# Patient Record
Sex: Female | Born: 1945 | Race: White | Hispanic: No | Smoking: Current every day smoker
Health system: Southern US, Community
[De-identification: ages and names within clinical notes are randomized; demographics above are authoritative.]

## PROBLEM LIST (undated history)

## (undated) DIAGNOSIS — Z972 Presence of dental prosthetic device (complete) (partial): Secondary | ICD-10-CM

## (undated) DIAGNOSIS — F32A Depression, unspecified: Secondary | ICD-10-CM

## (undated) DIAGNOSIS — F329 Major depressive disorder, single episode, unspecified: Secondary | ICD-10-CM

## (undated) DIAGNOSIS — K219 Gastro-esophageal reflux disease without esophagitis: Secondary | ICD-10-CM

## (undated) DIAGNOSIS — I639 Cerebral infarction, unspecified: Secondary | ICD-10-CM

## (undated) DIAGNOSIS — F419 Anxiety disorder, unspecified: Secondary | ICD-10-CM

## (undated) DIAGNOSIS — I10 Essential (primary) hypertension: Secondary | ICD-10-CM

## (undated) DIAGNOSIS — J449 Chronic obstructive pulmonary disease, unspecified: Secondary | ICD-10-CM

## (undated) DIAGNOSIS — G8929 Other chronic pain: Secondary | ICD-10-CM

## (undated) DIAGNOSIS — I2510 Atherosclerotic heart disease of native coronary artery without angina pectoris: Secondary | ICD-10-CM

## (undated) DIAGNOSIS — E7800 Pure hypercholesterolemia, unspecified: Secondary | ICD-10-CM

## (undated) DIAGNOSIS — M4319 Spondylolisthesis, multiple sites in spine: Secondary | ICD-10-CM

## (undated) HISTORY — DX: Depression, unspecified: F32.A

## (undated) HISTORY — DX: Anxiety disorder, unspecified: F41.9

## (undated) HISTORY — PX: BACK SURGERY: SHX140

## (undated) HISTORY — DX: Major depressive disorder, single episode, unspecified: F32.9

## (undated) HISTORY — DX: Cerebral infarction, unspecified: I63.9

## (undated) HISTORY — DX: Gastro-esophageal reflux disease without esophagitis: K21.9

---

## 1980-07-29 HISTORY — PX: ABDOMINAL HYSTERECTOMY: SHX81

## 2005-10-06 DIAGNOSIS — F410 Panic disorder [episodic paroxysmal anxiety] without agoraphobia: Secondary | ICD-10-CM | POA: Insufficient documentation

## 2006-06-26 DIAGNOSIS — Z720 Tobacco use: Secondary | ICD-10-CM | POA: Insufficient documentation

## 2009-02-15 ENCOUNTER — Ambulatory Visit: Payer: Self-pay | Admitting: Family Medicine

## 2009-02-15 DIAGNOSIS — F411 Generalized anxiety disorder: Secondary | ICD-10-CM | POA: Insufficient documentation

## 2009-03-01 DIAGNOSIS — R7309 Other abnormal glucose: Secondary | ICD-10-CM | POA: Insufficient documentation

## 2009-03-01 LAB — HM MAMMOGRAPHY

## 2009-04-26 ENCOUNTER — Ambulatory Visit: Payer: Self-pay | Admitting: Gastroenterology

## 2009-06-14 ENCOUNTER — Ambulatory Visit: Payer: Self-pay | Admitting: Gastroenterology

## 2011-04-03 ENCOUNTER — Ambulatory Visit: Payer: Self-pay | Admitting: Nephrology

## 2013-11-03 ENCOUNTER — Observation Stay: Payer: Self-pay | Admitting: Internal Medicine

## 2013-11-03 LAB — CBC
HCT: 39.7 % (ref 35.0–47.0)
HGB: 13.4 g/dL (ref 12.0–16.0)
MCH: 33.5 pg (ref 26.0–34.0)
MCHC: 33.8 g/dL (ref 32.0–36.0)
MCV: 99 fL (ref 80–100)
Platelet: 256 10*3/uL (ref 150–440)
RBC: 4.01 10*6/uL (ref 3.80–5.20)
RDW: 13.3 % (ref 11.5–14.5)
WBC: 6.7 10*3/uL (ref 3.6–11.0)

## 2013-11-03 LAB — BASIC METABOLIC PANEL
ANION GAP: 5 — AB (ref 7–16)
BUN: 10 mg/dL (ref 7–18)
CHLORIDE: 93 mmol/L — AB (ref 98–107)
CREATININE: 0.86 mg/dL (ref 0.60–1.30)
Calcium, Total: 8.8 mg/dL (ref 8.5–10.1)
Co2: 28 mmol/L (ref 21–32)
EGFR (Non-African Amer.): >60
Glucose: 110 mg/dL — ABNORMAL HIGH (ref 65–99)
Osmolality: 253 (ref 275–301)
Potassium: 4.4 mmol/L (ref 3.5–5.1)
Sodium: 126 mmol/L — ABNORMAL LOW (ref 136–145)

## 2013-11-03 LAB — TROPONIN I: Troponin-I: <0.02 ng/mL

## 2013-11-03 LAB — OSMOLALITY: Osmolality: 266 mOsm/kg — ABNORMAL LOW (ref 280–301)

## 2013-11-03 LAB — TSH: Thyroid Stimulating Horm: 1.97 u[IU]/mL

## 2013-11-03 LAB — PRO B NATRIURETIC PEPTIDE: B-TYPE NATIURETIC PEPTID: 2020 pg/mL — AB (ref 0–125)

## 2013-11-04 LAB — BASIC METABOLIC PANEL
ANION GAP: 3 — AB (ref 7–16)
BUN: 12 mg/dL (ref 7–18)
CHLORIDE: 97 mmol/L — AB (ref 98–107)
CO2: 31 mmol/L (ref 21–32)
Calcium, Total: 8.8 mg/dL (ref 8.5–10.1)
Creatinine: 0.82 mg/dL (ref 0.60–1.30)
EGFR (African American): >60
EGFR (Non-African Amer.): >60
Glucose: 137 mg/dL — ABNORMAL HIGH (ref 65–99)
Osmolality: 265 (ref 275–301)
Potassium: 4 mmol/L (ref 3.5–5.1)
SODIUM: 131 mmol/L — AB (ref 136–145)

## 2013-12-08 ENCOUNTER — Ambulatory Visit: Payer: Self-pay | Admitting: Urgent Care

## 2013-12-31 DIAGNOSIS — I071 Rheumatic tricuspid insufficiency: Secondary | ICD-10-CM | POA: Insufficient documentation

## 2013-12-31 DIAGNOSIS — R079 Chest pain, unspecified: Secondary | ICD-10-CM | POA: Insufficient documentation

## 2013-12-31 DIAGNOSIS — J449 Chronic obstructive pulmonary disease, unspecified: Secondary | ICD-10-CM | POA: Insufficient documentation

## 2013-12-31 DIAGNOSIS — I340 Nonrheumatic mitral (valve) insufficiency: Secondary | ICD-10-CM | POA: Insufficient documentation

## 2014-02-01 ENCOUNTER — Ambulatory Visit: Payer: Self-pay | Admitting: Gastroenterology

## 2014-02-01 LAB — HM COLONOSCOPY

## 2014-02-03 LAB — PATHOLOGY REPORT

## 2014-08-05 LAB — CBC AND DIFFERENTIAL
HCT: 40 % (ref 36–46)
Hemoglobin: 13.7 g/dL (ref 12.0–16.0)
NEUTROS ABS: 3 /uL
Platelets: 312 10*3/uL (ref 150–399)
WBC: 6.2 10^3/mL

## 2014-08-05 LAB — LIPID PANEL
Cholesterol: 226 mg/dL — AB (ref 0–200)
HDL: 69 mg/dL (ref 35–70)
LDL Cholesterol: 138 mg/dL
Triglycerides: 95 mg/dL (ref 40–160)

## 2014-08-05 LAB — BASIC METABOLIC PANEL
BUN: 12 mg/dL (ref 4–21)
CREATININE: 0.8 mg/dL (ref 0.5–1.1)
Glucose: 98 mg/dL
Potassium: 4.8 mmol/L (ref 3.4–5.3)
SODIUM: 134 mmol/L — AB (ref 137–147)

## 2014-08-05 LAB — HEPATIC FUNCTION PANEL
ALK PHOS: 102 U/L (ref 25–125)
ALT: 7 U/L (ref 7–35)
AST: 18 U/L (ref 13–35)
Bilirubin, Total: 0.4 mg/dL

## 2014-08-05 LAB — TSH: TSH: 1.16 u[IU]/mL (ref 0.41–5.90)

## 2014-08-05 LAB — HEMOGLOBIN A1C: Hgb A1c MFr Bld: 5.6 % (ref 4.0–6.0)

## 2014-11-19 NOTE — Discharge Summary (Signed)
PATIENT NAME:  [name] [name], [name] [name] MR#:  [id number] DATE OF BIRTH:  [birth year]  DATE OF ADMISSION:  11/03/2013  DATE OF DISCHARGE:  11/04/2013  ADMISSION DIAGNOSIS:  Hypertension.   DISCHARGE DIAGNOSES:  1.  Accelerated hypertension.  2.  History of alcohol abuse.  3.  History of tobacco abuse.  4.  History of COPD.   5.  Weight loss with flushing.  6.  Hyponatremia.   CONSULTATIONS: None.   LABORATORIES AT DISCHARGE:   Sodium 131, potassium 4.0, chloride 97, bicarbonate 31, BUN 12, creatinine 0.18, glucose is 137.   CT of the head showed no acute intracranial hemorrhage.   HOSPITAL COURSE:  69 year old female who was brought in by her primary care physician with elevated blood pressure. For further details, please see H and P.   1.  Accelerated hypertension. The patient was admitted to telemetry for accelerated hypertension. She was placed on lisinopril and metoprolol. I discussed with her that it is very important that she follow up with her primary care physician regarding her hypertension. I suspect that she has had long-standing hypertension and she has not seen a PCP for this. Since she is on the lisinopril, she will need labs for creatinine and  potassium, which she was made aware of.  2.  Alcohol and tobacco abuse. The patient recounseled and patient says she is going to stop smoking and drinking alcohol. We are discharging her with a nicotine patch and we encourage her.  3.  COPD, which is stable. The patient will be discharged with Advair, which was written for label to be discharged with.   4.  Old CVA. This is seen on a head CT. She is started on aspirin and statin, however due to her alcohol history I have not discharged the patient on a statin but she will continue aspirin.   DISCHARGE MEDICATIONS:  1.  Lisinopril 40 mg daily.  2.  Metoprolol 50 mg daily.  3.  Xanax 0.5 mg b.i.d. p.r.n.  4.  Aspirin 81 mg daily.  5. Nicotine patch 21 mg per 24 hours.   DISCHARGE DIET: Low  sodium.   DISCHARGE ACTIVITY: As tolerated.   DISCHARGE FOLLOW UP:  Patient will follow up next week with Dr. Venia Minks.   TIME SPENT: Approximately 35 minutes.   DISPOSITION:  The patient is medically stable for discharge.     ___________________________ Analeese Andreatta P. Benjie Karvonen, MD spm:tc D: 11/04/2013 14:11:32 ET T: 11/04/2013 15:16:27 ET JOB#: [id number]  cc: Darely Becknell P. Benjie Karvonen, MD, <Dictator> Donell Beers Tamanna Whitson MD ELECTRONICALLY SIGNED 11/05/2013 12:50

## 2014-11-19 NOTE — H&P (Signed)
PATIENT NAME:  [name] [name] MR#:  [id number] DATE OF BIRTH:  [birth year]  DATE OF ADMISSION:  11/03/2013  PRIMARY CARE PROVIDER: Jerrell Belfast, MD  CARDIOLOGIST: Isaias Cowman, MD  NEPHROLOGY: Tama High, MD  CHIEF COMPLAINT: High blood pressure and eye spots.   HISTORY OF PRESENT ILLNESS: A 69 year old Caucasian female patient with history of hypertension, COPD, anxiety and alcohol abuse, tobacco abuse, presents to the Emergency Room sent in by her primary care physician's office after she called her saying her blood pressure is high and has spots in her eyes. The patient had blood pressure elevated at 235/102 in the Emergency Room. In spite of 2 IV doses of labetalol 10 mg, her blood pressure is 203/91 and with headache, some spots in her eyes, is being admitted to the hospitalist service. When I see the patient, the patient mentions that her vision is better although she feels like her glasses need to be changed. Headache is improved. The patient has had on-and-off chest pain which improves with belching for over 2 years for which she has seen Dr. Saralyn Pilar, had workup done. Presently, no chest pain. She has chronic shortness of breath which is the same. She continues to smoke a pack a day. She mentions that her husband passed away in 08/10/2013 and since then, she has been drinking about 4 to 6 beers a day to cope with grief. No recent change in blood pressure pills.    PAST MEDICAL HISTORY: 1.  COPD.   2.  Hypertension.  3.  Anxiety.  4.  Tobacco abuse.  5.  Alcohol abuse.  6.  Partial hysterectomy.   FAMILY HISTORY: CABG in her dad.   SOCIAL HISTORY: The patient smokes a pack a day, 4 to 6 beers a day. No illicit drugs. Lives alone.   ALLERGIES: No known drug allergies.   REVIEW OF SYSTEMS:    CONSTITUTIONAL: Complains of fatigue.  EYES:  No blurred vision, pain, redness.  ENT: No tinnitus, ear pain, hearing loss.  RESPIRATORY:  Has chronic cough, some  wheezing, COPD.  CARDIOVASCULAR: No chest pain, orthopnea, edema.  GASTROINTESTINAL: No nausea, vomiting, diarrhea, abdominal pain.  GENITOURINARY: No dysuria, hematuria, frequency.  ENDOCRINE: No polyuria, nocturia, thyroid problems.  HEMATOLOGIC AND LYMPHATIC: No anemia, easy bruising or bleeding.  INTEGUMENTARY:  No acne, rash or lesion. MUSCULOSKELETAL: No back pain, arthritis.  NEUROLOGIC: No focal numbness, weakness. Does have headache.  PSYCHIATRIC: Has anxiety.   HOME MEDICATIONS: 1.  Alprazolam 0.5 mg 1 tablet 2 times a day as needed.  2.  Ibuprofen 200 mg 2 tablets 4 times a day as needed for pain.  3.  Metoprolol succinate 50 mg extended-release daily.   PHYSICAL EXAMINATION: VITAL SIGNS:  Blood pressure 235/102, presently improved to 203/91, saturating 95% on room air. Heart rate 74, temperature 97.6.  GENERAL: Moderately-built Caucasian female patient lying in bed, seems comfortable, conversational, cooperative with exam.  PSYCHIATRIC: Is alert and oriented x 3 but anxious.  HEENT:  Atraumatic, normocephalic. Oral mucosa moist and pink. External ears normal. No pallor. No icterus. No oral ulcers or thrush. Pupils bilaterally equal and reactive to light.  NECK:  Supple, no thyromegaly or palpable lymph nodes. Trachea midline. No carotid bruit, JVD.  CARDIOVASCULAR: S1, S2, without any murmurs, peripheral pulses 2+. No edema.  RESPIRATORY: Normal work of breathing. Clear to auscultation on both sides.  GASTROINTESTINAL: Soft abdomen, nontender. Bowel sounds present. No hepatosplenomegaly palpable.  GENITOURINARY: No CVA tenderness, bladder distention.  SKIN:  Warm and dry. No petechiae, rash, ulcers.  MUSCULOSKELETAL: No joint swelling, redness, effusion of the large joints. Normal muscle tone.  NEUROLOGICAL: Motor strength 5/5 in upper and lower extremities. Sensation is intact all over. No nystagmus.  LYMPHATIC:  No cervical lymphadenopathy.   LABORATORY, DIAGNOSTIC AND  RADIOLOGICAL DATA:   1.  Show glucose of 110. BNP of 2020. BUN 10, creatinine 0.86, sodium 136, potassium 4.4, chloride 93. Her troponin is less than 0.02. TSH of 1.97.  2.  WBC 6.7,  platelets of 256.  3.  EKG shows normal sinus rhythm with poor R wave progression. No ST-T wave changes. Does have LVH.   4.  CT scan of the head without contrast shows old lacunar infarctions in the basal ganglia. Nothing acute. Mild generalized atrophy.  5.  Chest x-ray, PA and lateral, showed COPD without any evidence of acute abnormalities.   ASSESSMENT AND PLAN: 1.  Accelerated hypertension with headache and vision changes in a patient with alcohol abuse and prior history of hypertension. Presently, I suspect her blood pressure has worsened over time with no change in medications but also her alcohol abuse is contributing to this. We will give her a dose of stat lisinopril, put her on a telemetry bed and monitor closely. Use IV p.r.n. medications as needed. Will trend down the blood pressure slowly.  2.  History of cerebrovascular accident. Nothing acute on the CT scan of the head but has history of lacunar infarcts likely secondary from uncontrolled hypertension. We will start her on a statin and baby aspirin.  3.  Chronic hyponatremia. The patient has been seen by nephrologist and she mentioned that she does run low. This could have been worsened a little with her daily beer intake. Needs to be monitored.  4.  Tobacco abuse. Counseled patient to quit smoking for greater than 3 minutes.  5.  Chronic obstructive pulmonary disease, stable.  6.  Alcohol abuse, contributing to her hypertension. I have advised her to stop drinking alcohol like beer. Will watch her for any withdrawal symptoms.  7.  Deep vein thrombosis prophylaxis with sequential compression devices.  8.  CODE STATUS: Full code.   TIMES SPENT ON THIS CASE: Was 50 minutes.   ____________________________ Leia Alf Leemon Ayala, MD srs:cs D: 11/03/2013  14:42:25 ET T: 11/03/2013 15:05:52 ET JOB#: [id number]  cc: Alveta Heimlich R. Darvin Neighbours, MD, <Dictator> Jerrell Belfast, MD Isaias Cowman, MD [place] Lilian Kapur, MD Neita Carp MD ELECTRONICALLY SIGNED 11/04/2013 10:41

## 2014-12-28 ENCOUNTER — Emergency Department: Payer: Medicare Other

## 2014-12-28 ENCOUNTER — Encounter: Payer: Self-pay | Admitting: Emergency Medicine

## 2014-12-28 ENCOUNTER — Emergency Department
Admission: EM | Admit: 2014-12-28 | Discharge: 2014-12-28 | Disposition: A | Discharge status: Home / Self Care | Payer: Medicare Other | Attending: Student | Admitting: Student

## 2014-12-28 DIAGNOSIS — I10 Essential (primary) hypertension: Secondary | ICD-10-CM | POA: Diagnosis not present

## 2014-12-28 DIAGNOSIS — S2232XA Fracture of one rib, left side, initial encounter for closed fracture: Secondary | ICD-10-CM | POA: Insufficient documentation

## 2014-12-28 DIAGNOSIS — S299XXA Unspecified injury of thorax, initial encounter: Secondary | ICD-10-CM | POA: Diagnosis present

## 2014-12-28 DIAGNOSIS — S2231XA Fracture of one rib, right side, initial encounter for closed fracture: Secondary | ICD-10-CM

## 2014-12-28 DIAGNOSIS — Y9289 Other specified places as the place of occurrence of the external cause: Secondary | ICD-10-CM | POA: Diagnosis not present

## 2014-12-28 DIAGNOSIS — Z720 Tobacco use: Secondary | ICD-10-CM | POA: Diagnosis not present

## 2014-12-28 DIAGNOSIS — W1839XA Other fall on same level, initial encounter: Secondary | ICD-10-CM | POA: Insufficient documentation

## 2014-12-28 DIAGNOSIS — J942 Hemothorax: Secondary | ICD-10-CM | POA: Diagnosis not present

## 2014-12-28 DIAGNOSIS — Y9389 Activity, other specified: Secondary | ICD-10-CM | POA: Insufficient documentation

## 2014-12-28 DIAGNOSIS — S20212A Contusion of left front wall of thorax, initial encounter: Secondary | ICD-10-CM | POA: Insufficient documentation

## 2014-12-28 DIAGNOSIS — Y998 Other external cause status: Secondary | ICD-10-CM | POA: Insufficient documentation

## 2014-12-28 HISTORY — DX: Atherosclerotic heart disease of native coronary artery without angina pectoris: I25.10

## 2014-12-28 HISTORY — DX: Pure hypercholesterolemia, unspecified: E78.00

## 2014-12-28 HISTORY — DX: Chronic obstructive pulmonary disease, unspecified: J44.9

## 2014-12-28 HISTORY — DX: Essential (primary) hypertension: I10

## 2014-12-28 MED ORDER — IBUPROFEN 800 MG PO TABS
ORAL_TABLET | ORAL | Status: AC
  Filled 2014-12-28: qty 1

## 2014-12-28 MED ORDER — IBUPROFEN 800 MG PO TABS
800.0000 mg | ORAL_TABLET | Freq: Once | ORAL | Status: AC
  Administered 2014-12-28: 800 mg via ORAL

## 2014-12-28 MED ORDER — OXYCODONE-ACETAMINOPHEN 5-325 MG PO TABS
1.0000 | ORAL_TABLET | Freq: Once | ORAL | Status: AC
  Administered 2014-12-28: 1 via ORAL

## 2014-12-28 MED ORDER — OXYCODONE-ACETAMINOPHEN 5-325 MG PO TABS
ORAL_TABLET | ORAL | Status: AC
  Filled 2014-12-28: qty 1

## 2014-12-28 MED ORDER — OXYCODONE-ACETAMINOPHEN 5-325 MG PO TABS: 1.0000 | ORAL_TABLET | ORAL | Status: DC | PRN

## 2014-12-28 NOTE — ED Notes (Signed)
States she fell on sat. And having pain to left lateral rib area

## 2014-12-28 NOTE — ED Provider Notes (Signed)
[facility] Emergency Department Provider Note ____________________________________________  Time seen: Approximately 12:08 PM  I have reviewed the triage vital signs and the nursing notes.   HISTORY  Chief Complaint Rib Injury    HPI [name] [name] is a 69 y.o. female complaining of left rib pain secondary to fall 3 days ago. Patient has noticed some bruising to the left lateral rib cage and pain with deep respirations. Patient denies any shortness of breath. Patient rates the pain as a 9/10 which increases with movement and deep breathing. States unresolved pain with over-the-counter medications.   Past Medical History  Diagnosis Date  . COPD (chronic obstructive pulmonary disease)   . Hypertension   . CAD (coronary artery disease) unk  . Hypercholesteremia unk    There are no active problems to display for this patient.   No past surgical history on file.  Current Outpatient Rx  Name  Route  Sig  Dispense  Refill  . oxyCODONE-acetaminophen (ROXICET) 5-325 MG per tablet   Oral   Take 1 tablet by mouth every 4 (four) hours as needed for severe pain.   20 tablet   0     Allergies Review of patient's allergies indicates not on file.  No family history on file.  Social History History  Substance Use Topics  . Smoking status: Current Every Day Smoker  . Smokeless tobacco: Not on file  . Alcohol Use: No    Review of Systems Constitutional: No fever/chills Eyes: No visual changes. ENT: No sore throat. Cardiovascular: Denies chest pain. Respiratory: Denies shortness of breath. Gastrointestinal: No abdominal pain.  No nausea, no vomiting.  No diarrhea.  No constipation. Genitourinary: Negative for dysuria. Musculoskeletal: Left lateral chest wall pain. Skin: Bruises left lateral chest wall Neurological: Negative for headaches, focal weakness or numbness. 10-point ROS otherwise  negative.  ____________________________________________   PHYSICAL EXAM:  VITAL SIGNS: ED Triage Vitals  Enc Vitals Group     BP 12/28/14 1134 117/62 mmHg     Pulse Rate 12/28/14 1134 75     Resp 12/28/14 1134 18     Temp 12/28/14 1134 97.7 F (36.5 C)     Temp Source 12/28/14 1134 Oral     SpO2 12/28/14 1134 96 %     Weight 12/28/14 1134 115 lb (52.164 kg)     Height 12/28/14 1134 5\' 5"  (1.651 m)     Head Cir --      Peak Flow --      Pain Score 12/28/14 1135 9     Pain Loc --      Pain Edu? --      Excl. in [place]? --    Constitutional: Alert and oriented. Well appearing and in no acute distress. Eyes: Conjunctivae are normal. PERRL. EOMI. Head: Atraumatic. Nose: No congestion/rhinnorhea. Mouth/Throat: Mucous membranes are moist.  Oropharynx non-erythematous. Neck: No stridor.  No spinal deformity free nuchal range of motion nontender palpation. Hematological/Lymphatic/Immunilogical: No cervical lymphadenopathy. Cardiovascular: Normal rate, regular rhythm. Grossly normal heart sounds.  Good peripheral circulation. Respiratory: Decreased respiratory effort.  No retractions. Lungs CTAB. Gastrointestinal: Soft and nontender. No distention. No abdominal bruits. No CVA tenderness. Musculoskeletal: No obvious deformity left lateral chest wall there is ecchymosis and guarding with palpation. Decreased lung expansion secondary to complain of pain. Neurologic:  Normal speech and language. No gross focal neurologic deficits are appreciated. Speech is normal. No gait instability. Skin:  Skin is warm, dry and intact. No rash noted. Psychiatric: Mood and affect  are normal. Speech and behavior are normal.  ____________________________________________   LABS (all labs ordered are listed, but only abnormal results are displayed)  Labs Reviewed - No data to display ____________________________________________  EKG   ____________________________________________  RADIOLOGY  Minimal  displaced rib fractures of the left side 7 through 10. Small hemothorax. ____________________________________________   PROCEDURES  Procedure(s) performed: None  Critical Care performed: No  ____________________________________________   INITIAL IMPRESSION / ASSESSMENT AND PLAN / ED COURSE  Pertinent labs & imaging results that were available during my care of the patient were reviewed by me and considered in my medical decision making (see chart for details).  Left rib contusion. Discussed x-ray findings with Dr. Burt Knack who advised patient does not meet criteria for chest tube. Discussed home treatment plan with patient and daughter. ____________________________________________   FINAL CLINICAL IMPRESSION(S) / ED DIAGNOSES  Final diagnoses:  Right rib fracture, closed, initial encounter  Hemothorax, left      Sable Feil, PA-C 12/28/14 Tunica [name], MD 12/29/14 [phone]

## 2014-12-29 ENCOUNTER — Other Ambulatory Visit: Payer: Self-pay

## 2014-12-29 DIAGNOSIS — I10 Essential (primary) hypertension: Secondary | ICD-10-CM | POA: Insufficient documentation

## 2014-12-29 DIAGNOSIS — E785 Hyperlipidemia, unspecified: Secondary | ICD-10-CM | POA: Insufficient documentation

## 2014-12-29 DIAGNOSIS — K219 Gastro-esophageal reflux disease without esophagitis: Secondary | ICD-10-CM | POA: Insufficient documentation

## 2014-12-29 DIAGNOSIS — R87629 Unspecified abnormal cytological findings in specimens from vagina: Secondary | ICD-10-CM | POA: Insufficient documentation

## 2014-12-29 DIAGNOSIS — R6889 Other general symptoms and signs: Secondary | ICD-10-CM | POA: Insufficient documentation

## 2014-12-29 DIAGNOSIS — E871 Hypo-osmolality and hyponatremia: Secondary | ICD-10-CM | POA: Insufficient documentation

## 2014-12-29 DIAGNOSIS — Z6823 Body mass index (BMI) 23.0-23.9, adult: Secondary | ICD-10-CM | POA: Insufficient documentation

## 2014-12-29 DIAGNOSIS — K259 Gastric ulcer, unspecified as acute or chronic, without hemorrhage or perforation: Secondary | ICD-10-CM | POA: Insufficient documentation

## 2014-12-29 DIAGNOSIS — F32A Depression, unspecified: Secondary | ICD-10-CM

## 2014-12-29 DIAGNOSIS — J449 Chronic obstructive pulmonary disease, unspecified: Secondary | ICD-10-CM | POA: Insufficient documentation

## 2014-12-29 DIAGNOSIS — R0689 Other abnormalities of breathing: Secondary | ICD-10-CM | POA: Insufficient documentation

## 2014-12-29 DIAGNOSIS — F329 Major depressive disorder, single episode, unspecified: Secondary | ICD-10-CM

## 2014-12-29 DIAGNOSIS — R002 Palpitations: Secondary | ICD-10-CM | POA: Insufficient documentation

## 2014-12-29 DIAGNOSIS — Z8673 Personal history of transient ischemic attack (TIA), and cerebral infarction without residual deficits: Secondary | ICD-10-CM | POA: Insufficient documentation

## 2014-12-29 DIAGNOSIS — R799 Abnormal finding of blood chemistry, unspecified: Secondary | ICD-10-CM | POA: Insufficient documentation

## 2014-12-29 DIAGNOSIS — M722 Plantar fascial fibromatosis: Secondary | ICD-10-CM | POA: Insufficient documentation

## 2014-12-29 MED ORDER — CITALOPRAM HYDROBROMIDE 20 MG PO TABS: 20.0000 mg | ORAL_TABLET | Freq: Every day | ORAL | Status: DC

## 2014-12-29 MED ORDER — METOPROLOL SUCCINATE ER 50 MG PO TB24: 50.0000 mg | ORAL_TABLET | Freq: Every day | ORAL | Status: DC

## 2014-12-29 MED ORDER — SIMVASTATIN 10 MG PO TABS: 10.0000 mg | ORAL_TABLET | Freq: Every day | ORAL | Status: DC

## 2014-12-30 ENCOUNTER — Ambulatory Visit (INDEPENDENT_AMBULATORY_CARE_PROVIDER_SITE_OTHER): Payer: Medicare Other | Admitting: Family Medicine

## 2014-12-30 ENCOUNTER — Encounter: Payer: Self-pay | Admitting: Family Medicine

## 2014-12-30 VITALS — BP 122/68 | HR 68 | Temp 97.7°F | Resp 16 | Ht 68.0 in | Wt 119.0 lb

## 2014-12-30 DIAGNOSIS — Z720 Tobacco use: Secondary | ICD-10-CM | POA: Diagnosis not present

## 2014-12-30 DIAGNOSIS — K5900 Constipation, unspecified: Secondary | ICD-10-CM | POA: Diagnosis not present

## 2014-12-30 DIAGNOSIS — S2232XD Fracture of one rib, left side, subsequent encounter for fracture with routine healing: Secondary | ICD-10-CM | POA: Diagnosis not present

## 2014-12-30 DIAGNOSIS — J441 Chronic obstructive pulmonary disease with (acute) exacerbation: Secondary | ICD-10-CM | POA: Diagnosis not present

## 2014-12-30 MED ORDER — DOXYCYCLINE HYCLATE 100 MG PO TABS: 100.0000 mg | ORAL_TABLET | Freq: Two times a day (BID) | ORAL | Status: DC

## 2014-12-30 MED ORDER — PREDNISONE 10 MG PO TABS: ORAL_TABLET | ORAL | Status: DC

## 2014-12-30 MED ORDER — ALBUTEROL SULFATE HFA 108 (90 BASE) MCG/ACT IN AERS: 2.0000 | INHALATION_SPRAY | RESPIRATORY_TRACT | Status: DC | PRN

## 2014-12-30 NOTE — Progress Notes (Signed)
Subjective:    Patient ID: [name] [name], female    DOB: Jun 05, 1946, 69 y.o.   MRN: [id number]  Constipation This is a new problem. The current episode started more than 1 month ago. The problem has been gradually worsening (Especially since starting pain medication for rib fracture.) since onset. Her stool frequency is 2 to 3 times per week. She does not exercise regularly. Pertinent negatives include no abdominal pain, back pain, diarrhea, fever, nausea or vomiting.  Fall The accident occurred 5 to 7 days ago (Pt reports tripping in her yard and falling on her left side. ). The fall occurred while walking. There was no blood loss. Point of impact: Left Ribs. The pain is severe. Pertinent negatives include no abdominal pain, bowel incontinence, fever, nausea or vomiting.  Cough This is a chronic problem. The problem has been gradually worsening. The problem occurs constantly. The cough is productive of sputum. Associated symptoms include chest pain, myalgias, nasal congestion, postnasal drip, rhinorrhea, shortness of breath and wheezing. Pertinent negatives include no chills or fever. Risk factors for lung disease include smoking/tobacco exposure. She has tried nothing for the symptoms. Her past medical history is significant for COPD.      Review of Systems  Constitutional: Positive for activity change, appetite change and fatigue. Negative for fever, chills, diaphoresis and unexpected weight change.  HENT: Positive for postnasal drip and rhinorrhea.   Respiratory: Positive for cough, choking, shortness of breath and wheezing.   Cardiovascular: Positive for chest pain and leg swelling. Negative for palpitations.  Gastrointestinal: Positive for constipation. Negative for nausea, vomiting, abdominal pain, diarrhea, blood in stool and bowel incontinence.  Musculoskeletal: Positive for myalgias and arthralgias. Negative for back pain, joint swelling, neck pain and neck stiffness.   Patient  Active Problem List   Diagnosis Date Noted  . Alveolar aeration decreased 12/29/2014  . Body mass index (BMI) of 23.0-23.9 in adult 12/29/2014  . CAFL (chronic airflow limitation) 12/29/2014  . Abnormal finding of blood chemistry 12/29/2014  . Gastric ulcer 12/29/2014  . H/O transient cerebral ischemia 12/29/2014  . HLD (hyperlipidemia) 12/29/2014  . BP (high blood pressure) 12/29/2014  . Below normal amount of sodium in the blood 12/29/2014  . Awareness of heartbeats 12/29/2014  . Abnormal Pap smear of vagina 12/29/2014  . Plantar fasciitis 12/29/2014  . Esophageal reflux 12/29/2014  . 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 TI (tricuspid incompetence) 12/31/2013  . Abnormal blood sugar 03/01/2009  . Clinical depression 02/15/2009  . Current tobacco use 06/26/2006  . Episodic paroxysmal anxiety disorder 10/06/2005    History   Social History  . Marital Status: Widowed    Spouse Name: N/A  . Number of Children: N/A  . Years of Education: N/A   Occupational History  . Not on file.   Social History Main Topics  . Smoking status: Former Smoker -- 0.50 packs/day    Quit date: 12/24/2014  . Smokeless tobacco: Never Used  . Alcohol Use: Yes     Comment: occasional wine after dinner, drinks beer socially  . Drug Use: No  . Sexual Activity: Not on file   Other Topics Concern  . Not on file   Social History Narrative    Family History  Problem Relation Age of Onset  . Leukemia Mother   . Aneurysm Father   . Diabetes Father   . Heart disease Father   . Diabetes Maternal Grandmother   . Diabetes Paternal Grandmother     Past Surgical History  Procedure Laterality Date  . Abdominal hysterectomy  1982    Current Outpatient Prescriptions on File Prior to Visit  Medication Sig Dispense Refill  . ALPRAZolam (XANAX) 0.5 MG tablet Take by mouth.    Marland Kitchen buPROPion (WELLBUTRIN SR) 150 MG 12 hr tablet Take by mouth.    . citalopram (CELEXA) 20 MG tablet Take 1 tablet (20 mg  total) by mouth daily. 90 tablet 3  . escitalopram (LEXAPRO) 20 MG tablet Take by mouth.    . Fluticasone-Salmeterol (ADVAIR) 100-50 MCG/DOSE AEPB Inhale into the lungs.    Marland Kitchen lisinopril (PRINIVIL,ZESTRIL) 40 MG tablet Take by mouth.    . metoprolol succinate (TOPROL-XL) 50 MG 24 hr tablet Take 1 tablet (50 mg total) by mouth daily. 90 tablet 3  . nicotine (NICODERM CQ - DOSED IN MG/24 HOURS) 21 mg/24hr patch Place onto the skin.    Marland Kitchen oxyCODONE-acetaminophen (ROXICET) 5-325 MG per tablet Take 1 tablet by mouth every 4 (four) hours as needed for severe pain. 20 tablet 0  . pantoprazole (PROTONIX) 40 MG tablet Take by mouth.    . simvastatin (ZOCOR) 10 MG tablet Take 1 tablet (10 mg total) by mouth daily. 90 tablet 3   No current facility-administered medications on file prior to visit.   No Known Allergies BP 122/68 mmHg  Pulse 68  Temp(Src) 97.7 F (36.5 C) (Oral)  Resp 16  Ht 5\' 8"  (1.727 m)  Wt 119 lb (53.978 kg)  BMI 18.10 kg/m2  SpO2 93%      Objective:   Physical Exam  Constitutional: She is oriented to person, place, and time. She appears well-developed and well-nourished.  Cardiovascular: Normal rate, regular rhythm and normal heart sounds.   Pulmonary/Chest: She has rhonchi (Scattered).  Neurological: She is alert and oriented to person, place, and time.  Psychiatric: She has a normal mood and affect. Judgment normal.          Assessment & Plan:  1. Left rib fracture, with routine healing, subsequent encounter Will repeat chest x-ray Monday.    - DG Chest 2 View  2. Chronic obstructive pulmonary disease with acute exacerbation Worsening, RX for Doxy, 6 day prednisone taper, and Albuterol inhaler.  Pt instructed to call back if not improved or worsening.   - predniSONE (DELTASONE) 10 MG tablet; 6 for one day, 5 for one day, 4 for one day, 3 for one day, 2 for one day, 1 for one day.  Dispense: 21 tablet; Refill: 0 - albuterol (PROVENTIL HFA;VENTOLIN HFA) 108 (90  BASE) MCG/ACT inhaler; Inhale 2 puffs into the lungs every 4 (four) hours as needed for wheezing or shortness of breath.  Dispense: 1 Inhaler; Refill: 1 - doxycycline (VIBRA-TABS) 100 MG tablet; Take 1 tablet (100 mg total) by mouth 2 (two) times daily.  Dispense: 20 tablet; Refill: 0  3. Current tobacco use Pt has not smoked since 12/24/2014.  Encouraged pt to keep up the good work.    4. Constipation, unspecified constipation type Worsening especially since starting Oxycodone/APAP.  Advised pt to try Magnesium Citrate, MiraLax and Colace.    Patient was seen and examined by Jerrell Belfast, MD, and note scribed by Ashley Royalty, CMA.  I have reviewed the document for accuracy and completeness and I agree with above. Jerrell Belfast, MD

## 2015-01-02 ENCOUNTER — Telehealth: Payer: Self-pay

## 2015-01-02 ENCOUNTER — Other Ambulatory Visit: Payer: Self-pay | Admitting: Family Medicine

## 2015-01-02 ENCOUNTER — Ambulatory Visit
Admission: RE | Admit: 2015-01-02 | Discharge: 2015-01-02 | Disposition: A | Discharge status: Home / Self Care | Payer: Medicare Other | Source: Ambulatory Visit | Attending: Family Medicine | Admitting: Family Medicine

## 2015-01-02 DIAGNOSIS — S2232XD Fracture of one rib, left side, subsequent encounter for fracture with routine healing: Secondary | ICD-10-CM | POA: Diagnosis not present

## 2015-01-02 DIAGNOSIS — W19XXXD Unspecified fall, subsequent encounter: Secondary | ICD-10-CM | POA: Diagnosis not present

## 2015-01-02 NOTE — Telephone Encounter (Signed)
-----   Message from Margarita Rana, MD sent at 01/02/2015  9:58 AM EDT ----- Stable CXR. Would repeat in 4 weeks unless symptoms worsens. Thanks.

## 2015-01-02 NOTE — Telephone Encounter (Signed)
Pt advised as directed below.   Thanks,   -Malina Geers  

## 2015-01-05 ENCOUNTER — Other Ambulatory Visit: Payer: Self-pay | Admitting: Family Medicine

## 2015-01-05 DIAGNOSIS — S2239XD Fracture of one rib, unspecified side, subsequent encounter for fracture with routine healing: Secondary | ICD-10-CM

## 2015-01-05 MED ORDER — OXYCODONE-ACETAMINOPHEN 5-325 MG PO TABS: 1.0000 | ORAL_TABLET | ORAL | Status: DC | PRN

## 2015-01-05 NOTE — Telephone Encounter (Signed)
Printed rx.  Please notify patient. Sorry for confusion. Thanks.

## 2015-01-05 NOTE — Telephone Encounter (Signed)
Pt stated that she was here for an OV and we didn't call her Oxycodone into the pharmacy. I advised that we can not call that in and I asked her to hold so I could check up front. When I came back she was no longer on hold. I don't know if we write this for pt or not. Thanks TNP

## 2015-01-05 NOTE — Telephone Encounter (Signed)
Informed pt. Renaldo Fiddler, CMA

## 2015-01-31 ENCOUNTER — Telehealth: Payer: Self-pay | Admitting: Family Medicine

## 2015-01-31 DIAGNOSIS — S2232XD Fracture of one rib, left side, subsequent encounter for fracture with routine healing: Secondary | ICD-10-CM

## 2015-01-31 DIAGNOSIS — J90 Pleural effusion, not elsewhere classified: Secondary | ICD-10-CM

## 2015-01-31 DIAGNOSIS — S2239XD Fracture of one rib, unspecified side, subsequent encounter for fracture with routine healing: Secondary | ICD-10-CM

## 2015-01-31 NOTE — Telephone Encounter (Signed)
Pt needs appt to have a fu xray.  For broken ribs.  Would like Mickel Baas to call her back. [phone].  Thanks, tp

## 2015-02-01 NOTE — Telephone Encounter (Signed)
Pt is returning call.  XU#[phone]/[phone]

## 2015-02-02 DIAGNOSIS — S2239XA Fracture of one rib, unspecified side, initial encounter for closed fracture: Secondary | ICD-10-CM | POA: Insufficient documentation

## 2015-02-02 MED ORDER — OXYCODONE-ACETAMINOPHEN 5-325 MG PO TABS: 1.0000 | ORAL_TABLET | ORAL | Status: DC | PRN

## 2015-02-02 NOTE — Telephone Encounter (Signed)
Ok to recheck CXR. Please put in order.  Thanks.   Rx signed. Please notify patient.

## 2015-02-02 NOTE — Telephone Encounter (Signed)
[name] [name] says she is due to have a follow up xray for left rib fracture, does she need a repeat chest xray or rib xray?  She is also requesting a refill on her pain medication.  She reports feeling better but still needs some oxycodone on occasion.   Thanks,   -Mickel Baas

## 2015-02-03 DIAGNOSIS — J90 Pleural effusion, not elsewhere classified: Secondary | ICD-10-CM | POA: Insufficient documentation

## 2015-02-03 NOTE — Telephone Encounter (Signed)
Pt advised.   Thanks,   -Laura  

## 2015-02-09 ENCOUNTER — Ambulatory Visit
Admission: RE | Admit: 2015-02-09 | Discharge: 2015-02-09 | Disposition: A | Discharge status: Home / Self Care | Payer: Medicare Other | Source: Ambulatory Visit | Attending: Family Medicine | Admitting: Family Medicine

## 2015-02-09 DIAGNOSIS — J449 Chronic obstructive pulmonary disease, unspecified: Secondary | ICD-10-CM | POA: Insufficient documentation

## 2015-02-09 DIAGNOSIS — S2232XD Fracture of one rib, left side, subsequent encounter for fracture with routine healing: Secondary | ICD-10-CM | POA: Diagnosis not present

## 2015-02-09 DIAGNOSIS — X58XXXD Exposure to other specified factors, subsequent encounter: Secondary | ICD-10-CM | POA: Insufficient documentation

## 2015-02-10 ENCOUNTER — Telehealth: Payer: Self-pay

## 2015-02-10 NOTE — Telephone Encounter (Signed)
Pt advised as directed below.   Thanks,   -Chriss Redel  

## 2015-02-10 NOTE — Telephone Encounter (Signed)
-----   Message from Margarita Rana, MD sent at 02/09/2015  2:29 PM EDT ----- CXR normal. Thanks.

## 2015-03-24 ENCOUNTER — Other Ambulatory Visit: Payer: Self-pay

## 2015-03-24 DIAGNOSIS — K219 Gastro-esophageal reflux disease without esophagitis: Secondary | ICD-10-CM

## 2015-03-24 MED ORDER — PANTOPRAZOLE SODIUM 40 MG PO TBEC: 40.0000 mg | DELAYED_RELEASE_TABLET | Freq: Every day | ORAL | Status: DC

## 2015-03-27 ENCOUNTER — Other Ambulatory Visit: Payer: Self-pay

## 2015-03-27 DIAGNOSIS — K219 Gastro-esophageal reflux disease without esophagitis: Secondary | ICD-10-CM

## 2015-03-27 MED ORDER — PANTOPRAZOLE SODIUM 40 MG PO TBEC: 40.0000 mg | DELAYED_RELEASE_TABLET | Freq: Every day | ORAL | Status: DC

## 2015-04-04 ENCOUNTER — Other Ambulatory Visit: Payer: Self-pay | Admitting: Family Medicine

## 2015-04-04 DIAGNOSIS — F329 Major depressive disorder, single episode, unspecified: Secondary | ICD-10-CM

## 2015-04-04 DIAGNOSIS — F32A Depression, unspecified: Secondary | ICD-10-CM

## 2015-08-08 ENCOUNTER — Ambulatory Visit (INDEPENDENT_AMBULATORY_CARE_PROVIDER_SITE_OTHER): Payer: Medicare Other | Admitting: Family Medicine

## 2015-08-08 ENCOUNTER — Encounter: Payer: Self-pay | Admitting: Family Medicine

## 2015-08-08 VITALS — BP 110/64 | HR 62 | Temp 98.3°F | Resp 16 | Ht 63.5 in | Wt 123.0 lb

## 2015-08-08 DIAGNOSIS — Z23 Encounter for immunization: Secondary | ICD-10-CM

## 2015-08-08 DIAGNOSIS — Z13820 Encounter for screening for osteoporosis: Secondary | ICD-10-CM | POA: Diagnosis not present

## 2015-08-08 DIAGNOSIS — R7309 Other abnormal glucose: Secondary | ICD-10-CM

## 2015-08-08 DIAGNOSIS — D692 Other nonthrombocytopenic purpura: Secondary | ICD-10-CM | POA: Diagnosis not present

## 2015-08-08 DIAGNOSIS — E785 Hyperlipidemia, unspecified: Secondary | ICD-10-CM

## 2015-08-08 DIAGNOSIS — I10 Essential (primary) hypertension: Secondary | ICD-10-CM

## 2015-08-08 DIAGNOSIS — Z0000 Encounter for general adult medical examination without abnormal findings: Secondary | ICD-10-CM

## 2015-08-08 DIAGNOSIS — Z1239 Encounter for other screening for malignant neoplasm of breast: Secondary | ICD-10-CM

## 2015-08-08 DIAGNOSIS — Z720 Tobacco use: Secondary | ICD-10-CM | POA: Diagnosis not present

## 2015-08-08 MED ORDER — NICOTINE 21 MG/24HR TD PT24: 21.0000 mg | MEDICATED_PATCH | Freq: Every day | TRANSDERMAL | Status: DC

## 2015-08-08 NOTE — Progress Notes (Signed)
Patient ID: [name] [name], female   DOB: 1946-04-23, 70 y.o.   MRN: [id number]       Patient: [name] [name], Female    DOB: 24-Dec-1945, 70 y.o.   MRN: [id number] Visit Date: 08/08/2015  Today's Provider: Margarita Rana, MD   Chief Complaint  Patient presents with  . Medicare Wellness   Subjective:    Annual wellness visit [name] [name] is a 70 y.o. female. She feels well. She reports exercising stays active with daily activities. She reports she is sleeping fairly well. 08/15/14 CPE 02/01/14 Colon-hyperplastic polyp, tubular adenoma, diverticulosis, recheck 4 yrs 03/01/09 Mammo-BI-RADS 1  Lab Results  Component Value Date   WBC 6.2 08/05/2014   HGB 13.7 08/05/2014   HCT 40 08/05/2014   PLT 312 08/05/2014   GLUCOSE 137* 11/04/2013   CHOL 226* 08/05/2014   TRIG 95 08/05/2014   HDL 69 08/05/2014   LDLCALC 138 08/05/2014   ALT 7 08/05/2014   AST 18 08/05/2014   NA 134* 08/05/2014   K 4.8 08/05/2014   CL 97* 11/04/2013   CREATININE 0.8 08/05/2014   BUN 12 08/05/2014   CO2 31 11/04/2013   TSH 1.16 08/05/2014   HGBA1C 5.6 08/05/2014    -----------------------------------------------------------   Review of Systems  Constitutional: Positive for diaphoresis and fatigue.  HENT: Positive for congestion, facial swelling, hearing loss, mouth sores, postnasal drip, rhinorrhea, sinus pressure and tinnitus.   Eyes: Positive for photophobia, discharge, itching and visual disturbance.  Respiratory: Positive for cough, choking, chest tightness, shortness of breath and wheezing.   Cardiovascular: Positive for leg swelling.  Gastrointestinal: Positive for nausea, diarrhea and constipation.  Endocrine: Positive for cold intolerance, polydipsia and polyuria.  Genitourinary: Positive for urgency and enuresis.  Musculoskeletal: Positive for back pain and arthralgias.  Skin: Negative.   Allergic/Immunologic: Positive for immunocompromised state.  Neurological: Positive for dizziness,  facial asymmetry, weakness and headaches.  Hematological: Bruises/bleeds easily.  Psychiatric/Behavioral: Positive for confusion, decreased concentration and agitation. The patient is nervous/anxious.     Social History   Social History  . Marital Status: Widowed    Spouse Name: N/A  . Number of Children: N/A  . Years of Education: N/A   Occupational History  . Not on file.   Social History Main Topics  . Smoking status: Current Some Day Smoker -- 0.50 packs/day  . Smokeless tobacco: Never Used  . Alcohol Use: 3.0 oz/week    5 Glasses of wine per week     Comment: occasional wine after dinner, drinks beer socially  . Drug Use: No  . Sexual Activity: Not on file   Other Topics Concern  . Not on file   Social History Narrative    Past Medical History  Diagnosis Date  . COPD (chronic obstructive pulmonary disease) ([place])   . Hypertension   . CAD (coronary artery disease) unk  . Hypercholesteremia unk  . Depression   . Anxiety      Patient Active Problem List   Diagnosis Date Noted  . Pleural effusion 02/03/2015  . Rib fracture 02/02/2015  . Alveolar aeration decreased 12/29/2014  . Body mass index (BMI) of 23.0-23.9 in adult 12/29/2014  . CAFL (chronic airflow limitation) ([place]) 12/29/2014  . Abnormal finding of blood chemistry 12/29/2014  . Gastric ulcer 12/29/2014  . H/O transient cerebral ischemia 12/29/2014  . HLD (hyperlipidemia) 12/29/2014  . BP (high blood pressure) 12/29/2014  . Below normal amount of sodium in the blood 12/29/2014  . Awareness of heartbeats 12/29/2014  .  Abnormal Pap smear of vagina 12/29/2014  . Plantar fasciitis 12/29/2014  . Esophageal reflux 12/29/2014  . 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 TI (tricuspid incompetence) 12/31/2013  . Chest pain 12/31/2013  . Chronic obstructive pulmonary disease ([place]) 12/31/2013  . Abnormal blood sugar 03/01/2009  . Clinical depression 02/15/2009  . Current tobacco use 06/26/2006  . Episodic  paroxysmal anxiety disorder 10/06/2005    Past Surgical History  Procedure Laterality Date  . Abdominal hysterectomy  1982    Her family history includes Aneurysm in her father; Diabetes in her father, maternal grandmother, and paternal grandmother; Heart disease in her father; Leukemia in her mother.    Previous Medications   ALBUTEROL (PROVENTIL HFA;VENTOLIN HFA) 108 (90 BASE) MCG/ACT INHALER    Inhale 2 puffs into the lungs every 4 (four) hours as needed for wheezing or shortness of breath.   ALPRAZOLAM (XANAX) 0.5 MG TABLET    TAKE 1 TABLET BY MOUTH TWICE A DAY AS NEEDED   BUPROPION (WELLBUTRIN SR) 150 MG 12 HR TABLET    Take 150 mg by mouth daily.    CITALOPRAM (CELEXA) 20 MG TABLET    Take 1 tablet (20 mg total) by mouth daily.   FLUTICASONE-SALMETEROL (ADVAIR) 100-50 MCG/DOSE AEPB    Inhale 1 puff into the lungs 2 (two) times daily.    LISINOPRIL (PRINIVIL,ZESTRIL) 40 MG TABLET    Take 40 mg by mouth daily.    METOPROLOL SUCCINATE (TOPROL-XL) 50 MG 24 HR TABLET    Take 1 tablet (50 mg total) by mouth daily.   NICOTINE (NICODERM CQ - DOSED IN MG/24 HOURS) 21 MG/24HR PATCH    Place onto the skin. Reported on 08/08/2015   PANTOPRAZOLE (PROTONIX) 40 MG TABLET    Take 1 tablet (40 mg total) by mouth daily.   SIMVASTATIN (ZOCOR) 10 MG TABLET    Take 1 tablet (10 mg total) by mouth daily.    Patient Care Team: Margarita Rana, MD as PCP - General (Family Medicine)     Objective:   Vitals: BP 110/64 mmHg  Pulse 62  Temp(Src) 98.3 F (36.8 C) (Oral)  Resp 16  Ht 5' 3.5" (1.613 m)  Wt 123 lb (55.792 kg)  BMI 21.44 kg/m2  SpO2 95%  Physical Exam  Constitutional: She is oriented to person, place, and time. She appears well-developed and well-nourished.  HENT:  Head: Normocephalic and atraumatic.  Right Ear: Tympanic membrane, external ear and ear canal normal.  Left Ear: Tympanic membrane, external ear and ear canal normal.  Nose: Nose normal.  Mouth/Throat: Uvula is midline,  oropharynx is clear and moist and mucous membranes are normal.  Eyes: Conjunctivae, EOM and lids are normal. Pupils are equal, round, and reactive to light.  Neck: Trachea normal and normal range of motion. Neck supple. Carotid bruit is not present. No thyroid mass and no thyromegaly present.  Cardiovascular: Normal rate, regular rhythm and normal heart sounds.   Pulmonary/Chest: Effort normal and breath sounds normal.  Abdominal: Soft. Normal appearance and bowel sounds are normal. There is no hepatosplenomegaly. There is no tenderness.  Genitourinary: No breast swelling, tenderness or discharge.  Musculoskeletal: Normal range of motion.  Lymphadenopathy:    She has no cervical adenopathy.    She has no axillary adenopathy.  Neurological: She is alert and oriented to person, place, and time. She has normal strength. No cranial nerve deficit.  Skin: Skin is warm, dry and intact. Bruising and lesion noted.  Senile purpura  Psychiatric: She has  a normal mood and affect. Her speech is normal and behavior is normal. Judgment and thought content normal. Cognition and memory are normal.    Activities of Daily Living In your present state of health, do you have any difficulty performing the following activities: 08/08/2015  Hearing? Y  Vision? Y  Difficulty concentrating or making decisions? Y  Walking or climbing stairs? Y  Dressing or bathing? N  Doing errands, shopping? Y    Fall Risk Assessment Fall Risk  08/08/2015 12/30/2014  Falls in the past year? Yes Yes  Number falls in past yr: 1 2 or more  Injury with Fall? Yes Yes  Risk for fall due to : - History of fall(s)     Depression Screen PHQ 2/9 Scores 08/08/2015  PHQ - 2 Score 4  PHQ- 9 Score 9    Cognitive Testing - 6-CIT  Correct? Score   What year is it? yes 0 0 or 4  What month is it? yes 0 0 or 3  Memorize:    Pia Mau,  43,  [address]  [place],      What time is it? (within 1 hour) yes 0 0 or 3  Count backwards  from 20 yes 0 0, 2, or 4  Name the months of the year yes 0 0, 2, or 4  Repeat name & address above yes 0 0, 2, 4, 6, 8, or 10       TOTAL SCORE  0/28   Interpretation:  Normal  Normal (0-7) Abnormal (8-28)       Assessment & Plan:     Annual Wellness Visit  Reviewed patient's Family Medical History Reviewed and updated list of patient's medical providers Assessment of cognitive impairment was done Assessed patient's functional ability Established a written schedule for health screening [facility] Completed and Reviewed  Exercise Activities and Dietary recommendations Goals    . Exercise 150 minutes per week (moderate activity)       Immunization History  Administered Date(s) Administered  . Influenza,inj,Quad PF,36+ Mos 08/08/2015  . Pneumococcal Conjugate-13 03/11/2014  . Pneumococcal Polysaccharide-23 06/26/2006  . Tdap 03/22/2011   1. Medicare annual wellness visit, subsequent As above.   2. Need for influenza vaccination Given today.  - Flu Vaccine QUAD 36+ mos IM  3. Essential hypertension Condition is stable. Please continue current medication and  plan of care as noted.   - CBC with Differential/Platelet - Comprehensive metabolic panel  4. Abnormal blood sugar Stable.  - Hemoglobin A1c  5. HLD (hyperlipidemia) Stable. Continue current medication.   - Lipid Panel With LDL/HDL Ratio  6. Current tobacco use Recommend quitting. Counseled today.  Will restart Nicotine patch.  - nicotine (NICODERM CQ - DOSED IN MG/24 HOURS) 21 mg/24hr patch; Place 1 patch (21 mg total) onto the skin daily. Reported on 08/08/2015  Dispense: 28 patch; Refill: 0  7. Breast cancer screening Will call for apoointment.  - MM DIGITAL SCREENING BILATERAL; Future  8. Senile purpura ([place]) Noted today.    9. Osteoporosis screening Will schedule.   - DG Bone Density; Future Patient was seen and examined by Jerrell Belfast, MD, and note scribed by  Lynford Humphrey, [place].   I have reviewed the document for accuracy and completeness and I agree with above. Jerrell Belfast, MD   Margarita Rana, MD    ------------------------------------------------------------------------------------------------------------

## 2015-08-08 NOTE — Patient Instructions (Signed)
Please call the [facility] at [facility] to schedule this at [phone]   

## 2015-08-09 ENCOUNTER — Telehealth: Payer: Self-pay

## 2015-08-09 LAB — COMPREHENSIVE METABOLIC PANEL
ALBUMIN: 4.2 g/dL (ref 3.6–4.8)
ALK PHOS: 86 IU/L (ref 39–117)
ALT: 10 IU/L (ref 0–32)
AST: 17 IU/L (ref 0–40)
Albumin/Globulin Ratio: 1.6 (ref 1.1–2.5)
BUN / CREAT RATIO: 12 (ref 11–26)
BUN: 9 mg/dL (ref 8–27)
Bilirubin Total: 0.3 mg/dL (ref 0.0–1.2)
CHLORIDE: 90 mmol/L — AB (ref 96–106)
CO2: 23 mmol/L (ref 18–29)
Calcium: 9.1 mg/dL (ref 8.7–10.3)
Creatinine, Ser: 0.77 mg/dL (ref 0.57–1.00)
GFR calc Af Amer: 91 mL/min/{1.73_m2} (ref >59)
GFR calc non Af Amer: 79 mL/min/{1.73_m2} (ref >59)
Globulin, Total: 2.7 g/dL (ref 1.5–4.5)
Glucose: 93 mg/dL (ref 65–99)
Potassium: 4.9 mmol/L (ref 3.5–5.2)
Sodium: 130 mmol/L — ABNORMAL LOW (ref 134–144)
Total Protein: 6.9 g/dL (ref 6.0–8.5)

## 2015-08-09 LAB — CBC WITH DIFFERENTIAL/PLATELET
BASOS ABS: 0.1 10*3/uL (ref 0.0–0.2)
Basos: 1 %
EOS (ABSOLUTE): 0.1 10*3/uL (ref 0.0–0.4)
Eos: 2 %
Hematocrit: 38.3 % (ref 34.0–46.6)
Hemoglobin: 13 g/dL (ref 11.1–15.9)
IMMATURE GRANS (ABS): 0 10*3/uL (ref 0.0–0.1)
IMMATURE GRANULOCYTES: 0 %
LYMPHS: 36 %
Lymphocytes Absolute: 2.3 10*3/uL (ref 0.7–3.1)
MCH: 32.2 pg (ref 26.6–33.0)
MCHC: 33.9 g/dL (ref 31.5–35.7)
MCV: 95 fL (ref 79–97)
Monocytes Absolute: 0.5 10*3/uL (ref 0.1–0.9)
Monocytes: 7 %
NEUTROS PCT: 54 %
Neutrophils Absolute: 3.5 10*3/uL (ref 1.4–7.0)
PLATELETS: 322 10*3/uL (ref 150–379)
RBC: 4.04 x10E6/uL (ref 3.77–5.28)
RDW: 13.2 % (ref 12.3–15.4)
WBC: 6.4 10*3/uL (ref 3.4–10.8)

## 2015-08-09 LAB — LIPID PANEL WITH LDL/HDL RATIO
CHOLESTEROL TOTAL: 167 mg/dL (ref 100–199)
HDL: 56 mg/dL (ref >39)
LDL Calculated: 79 mg/dL (ref 0–99)
LDl/HDL Ratio: 1.4 ratio units (ref 0.0–3.2)
TRIGLYCERIDES: 158 mg/dL — AB (ref 0–149)
VLDL CHOLESTEROL CAL: 32 mg/dL (ref 5–40)

## 2015-08-09 LAB — HEMOGLOBIN A1C
ESTIMATED AVERAGE GLUCOSE: 114 mg/dL
Hgb A1c MFr Bld: 5.6 % (ref 4.8–5.6)

## 2015-08-09 NOTE — Telephone Encounter (Signed)
-----   Message from Margarita Rana, MD sent at 08/09/2015  8:38 AM EST ----- Labs stable except for mildly low sodium. Ok to liberalize salt intake a little bit.   Recheck in 4 weeks. Important to recheck. Thanks.

## 2015-08-09 NOTE — Telephone Encounter (Signed)
Patient advised as directed below. Patient verbalized understanding. Patient states she will call back to request a lab slip in 4 weeks.

## 2015-08-31 ENCOUNTER — Encounter: Payer: Self-pay | Admitting: Family Medicine

## 2015-09-07 ENCOUNTER — Ambulatory Visit (INDEPENDENT_AMBULATORY_CARE_PROVIDER_SITE_OTHER): Payer: Medicare Other | Admitting: Family Medicine

## 2015-09-07 ENCOUNTER — Encounter: Payer: Self-pay | Admitting: Family Medicine

## 2015-09-07 VITALS — BP 138/70 | HR 68 | Temp 97.8°F | Resp 20 | Ht 63.0 in | Wt 125.0 lb

## 2015-09-07 DIAGNOSIS — M810 Age-related osteoporosis without current pathological fracture: Secondary | ICD-10-CM | POA: Diagnosis not present

## 2015-09-07 DIAGNOSIS — F329 Major depressive disorder, single episode, unspecified: Secondary | ICD-10-CM

## 2015-09-07 DIAGNOSIS — F32A Depression, unspecified: Secondary | ICD-10-CM

## 2015-09-07 MED ORDER — ALPRAZOLAM 0.5 MG PO TABS: 0.5000 mg | ORAL_TABLET | Freq: Two times a day (BID) | ORAL | Status: DC | PRN

## 2015-09-07 MED ORDER — ALENDRONATE SODIUM 70 MG PO TABS: 70.0000 mg | ORAL_TABLET | ORAL | Status: DC

## 2015-09-07 NOTE — Progress Notes (Signed)
Subjective:    Patient ID: [name] [name], female    DOB: Sep 06, 1945, 70 y.o.   MRN: [id number]  URI  This is a new problem. The current episode started in the past 7 days. The problem has been gradually improving. There has been no fever. Associated symptoms include chest pain, congestion, coughing (dry), ear pain, headaches, nausea, a plugged ear sensation, rhinorrhea, sneezing, swollen glands and wheezing. Pertinent negatives include no abdominal pain, sinus pain, sore throat or vomiting. She has tried increased fluids (Emergen-C) for the symptoms. The treatment provided moderate relief.    Osteoporosis: Patient complains of osteoporosis. She was diagnosed with osteoporosis by bone density scan in 08/24/2015. Patient has history of fracture.The cause of osteoporosis is felt to be due to postmenopausal estrogen deficiency.   She is not currently being treated with calcium and vitamin D supplementation.  She is not currently being treated with bisphosphonates  Osteoporosis Risk Factors  Nonmodifiable Personal Hx of fracture as an adult: yes - ribs several years ago Hx of fracture in first-degree relative: yes - mother has fx knee and shoulder Caucasian race: yes Advanced age: yes Female sex: yes Dementia: no Poor health/frailty: no  Potentially modifiable: Tobacco use: yes - 1/2 PPD Low body weight (<127 lbs): yes Estrogen deficiency  early menopause (age <45) or bilateral ovariectomy: yes- hysterectomy at 70 YO  prolonged premenopausal amenorrhea (>1 yr): no Low calcium intake (lifelong): no Alcoholism: yes Recurrent falls: yes Inadequate physical activity: yes   Review of Systems  HENT: Positive for congestion, ear pain, rhinorrhea and sneezing. Negative for sore throat.   Respiratory: Positive for cough (dry) and wheezing.   Cardiovascular: Positive for chest pain.  Gastrointestinal: Positive for nausea. Negative for vomiting and abdominal pain.  Neurological: Positive  for headaches.   BP 138/70 mmHg  Pulse 68  Temp(Src) 97.8 F (36.6 C) (Oral)  Resp 20  Ht 5\' 3"  (1.6 m)  Wt 125 lb (56.7 kg)  BMI 22.15 kg/m2  SpO2 97%   Patient Active Problem List   Diagnosis Date Noted  . Pleural effusion 02/03/2015  . Rib fracture 02/02/2015  . Body mass index (BMI) of 23.0-23.9 in adult 12/29/2014  . Abnormal finding of blood chemistry 12/29/2014  . Gastric ulcer 12/29/2014  . H/O transient cerebral ischemia 12/29/2014  . HLD (hyperlipidemia) 12/29/2014  . BP (high blood pressure) 12/29/2014  . Below normal amount of sodium in the blood 12/29/2014  . Awareness of heartbeats 12/29/2014  . Abnormal Pap smear of vagina 12/29/2014  . Plantar fasciitis 12/29/2014  . Esophageal reflux 12/29/2014  . 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 TI (tricuspid incompetence) 12/31/2013  . Chest pain 12/31/2013  . Chronic obstructive pulmonary disease ([place]) 12/31/2013  . Abnormal blood sugar 03/01/2009  . Clinical depression 02/15/2009  . Current tobacco use 06/26/2006  . Episodic paroxysmal anxiety disorder 10/06/2005   Past Medical History  Diagnosis Date  . COPD (chronic obstructive pulmonary disease) ([place])   . Hypertension   . CAD (coronary artery disease) unk  . Hypercholesteremia unk  . Depression   . Anxiety    Current Outpatient Prescriptions on File Prior to Visit  Medication Sig  . albuterol (PROVENTIL HFA;VENTOLIN HFA) 108 (90 BASE) MCG/ACT inhaler Inhale 2 puffs into the lungs every 4 (four) hours as needed for wheezing or shortness of breath.  . ALPRAZolam (XANAX) 0.5 MG tablet TAKE 1 TABLET BY MOUTH TWICE A DAY AS NEEDED  . aspirin 81 MG tablet Take 81 mg by  mouth daily.  Marland Kitchen buPROPion (WELLBUTRIN SR) 150 MG 12 hr tablet Take 150 mg by mouth daily.   . citalopram (CELEXA) 20 MG tablet Take 1 tablet (20 mg total) by mouth daily.  . Fluticasone-Salmeterol (ADVAIR) 100-50 MCG/DOSE AEPB Inhale 1 puff into the lungs 2 (two) times daily.   Marland Kitchen lisinopril  (PRINIVIL,ZESTRIL) 40 MG tablet Take 40 mg by mouth daily.   . metoprolol succinate (TOPROL-XL) 50 MG 24 hr tablet Take 1 tablet (50 mg total) by mouth daily.  . nicotine (NICODERM CQ - DOSED IN MG/24 HOURS) 21 mg/24hr patch Place 1 patch (21 mg total) onto the skin daily. Reported on 08/08/2015  . pantoprazole (PROTONIX) 40 MG tablet Take 1 tablet (40 mg total) by mouth daily.  . simvastatin (ZOCOR) 10 MG tablet Take 1 tablet (10 mg total) by mouth daily.   No current facility-administered medications on file prior to visit.   No Known Allergies Past Surgical History  Procedure Laterality Date  . Abdominal hysterectomy  1982   Social History   Social History  . Marital Status: Widowed    Spouse Name: N/A  . Number of Children: N/A  . Years of Education: N/A   Occupational History  . Not on file.   Social History Main Topics  . Smoking status: Current Every Day Smoker -- 0.50 packs/day  . Smokeless tobacco: Never Used  . Alcohol Use: 9.0 oz/week    15 Glasses of wine per week     Comment: 2-3 glasses wine daily  . Drug Use: No  . Sexual Activity: Not on file   Other Topics Concern  . Not on file   Social History Narrative   Family History  Problem Relation Age of Onset  . Leukemia Mother   . Aneurysm Father   . Diabetes Father   . Heart disease Father   . Diabetes Maternal Grandmother   . Diabetes Paternal Grandmother       Objective:   Physical Exam  Constitutional: She is oriented to person, place, and time. She appears well-developed and well-nourished.  Cardiovascular: Normal rate and regular rhythm.   Pulmonary/Chest: Effort normal and breath sounds normal.  Neurological: She is alert and oriented to person, place, and time.  Psychiatric: She has a normal mood and affect. Her behavior is normal. Judgment and thought content normal.   BP 138/70 mmHg  Pulse 68  Temp(Src) 97.8 F (36.6 C) (Oral)  Resp 20  Ht 5\' 3"  (1.6 m)  Wt 125 lb (56.7 kg)  BMI 22.15  kg/m2  SpO2 97%      Assessment & Plan:  1. Osteoporosis New problem. Explained risks and benefits and will start medication to decrease risk of fracture.  Explained importance of following directions on how to take medication also. Will call if any problems or side effects.  - alendronate (FOSAMAX) 70 MG tablet; Take 1 tablet (70 mg total) by mouth every 7 (seven) days. Take with a full glass of water on an empty stomach.  Dispense: 4 tablet; Refill: 11  2. Clinical depression Stable. Refill medication.   - ALPRAZolam (XANAX) 0.5 MG tablet; Take 1 tablet (0.5 mg total) by mouth 2 (two) times daily as needed.  Dispense: 60 tablet; Refill: 5   Patient was seen and examined by Jerrell Belfast, MD, and note scribed by Renaldo Fiddler, CMA. I have reviewed the document for accuracy and completeness and I agree with above. Jerrell Belfast, MD   Margarita Rana, MD

## 2015-12-11 ENCOUNTER — Emergency Department: Payer: Medicare Other

## 2015-12-11 ENCOUNTER — Emergency Department
Admission: EM | Admit: 2015-12-11 | Discharge: 2015-12-11 | Disposition: A | Discharge status: Home / Self Care | Payer: Medicare Other | Attending: Emergency Medicine | Admitting: Emergency Medicine

## 2015-12-11 ENCOUNTER — Encounter: Payer: Self-pay | Admitting: Emergency Medicine

## 2015-12-11 DIAGNOSIS — Z7951 Long term (current) use of inhaled steroids: Secondary | ICD-10-CM | POA: Insufficient documentation

## 2015-12-11 DIAGNOSIS — W19XXXA Unspecified fall, initial encounter: Secondary | ICD-10-CM

## 2015-12-11 DIAGNOSIS — S42001A Fracture of unspecified part of right clavicle, initial encounter for closed fracture: Secondary | ICD-10-CM | POA: Insufficient documentation

## 2015-12-11 DIAGNOSIS — I2510 Atherosclerotic heart disease of native coronary artery without angina pectoris: Secondary | ICD-10-CM | POA: Insufficient documentation

## 2015-12-11 DIAGNOSIS — S0990XA Unspecified injury of head, initial encounter: Secondary | ICD-10-CM | POA: Diagnosis not present

## 2015-12-11 DIAGNOSIS — Z8673 Personal history of transient ischemic attack (TIA), and cerebral infarction without residual deficits: Secondary | ICD-10-CM | POA: Diagnosis not present

## 2015-12-11 DIAGNOSIS — Z7982 Long term (current) use of aspirin: Secondary | ICD-10-CM | POA: Insufficient documentation

## 2015-12-11 DIAGNOSIS — S42291A Other displaced fracture of upper end of right humerus, initial encounter for closed fracture: Secondary | ICD-10-CM | POA: Insufficient documentation

## 2015-12-11 DIAGNOSIS — E785 Hyperlipidemia, unspecified: Secondary | ICD-10-CM | POA: Insufficient documentation

## 2015-12-11 DIAGNOSIS — I10 Essential (primary) hypertension: Secondary | ICD-10-CM | POA: Insufficient documentation

## 2015-12-11 DIAGNOSIS — S42201A Unspecified fracture of upper end of right humerus, initial encounter for closed fracture: Secondary | ICD-10-CM | POA: Diagnosis not present

## 2015-12-11 DIAGNOSIS — Y999 Unspecified external cause status: Secondary | ICD-10-CM | POA: Insufficient documentation

## 2015-12-11 DIAGNOSIS — Z79899 Other long term (current) drug therapy: Secondary | ICD-10-CM | POA: Insufficient documentation

## 2015-12-11 DIAGNOSIS — M810 Age-related osteoporosis without current pathological fracture: Secondary | ICD-10-CM | POA: Diagnosis not present

## 2015-12-11 DIAGNOSIS — S4991XA Unspecified injury of right shoulder and upper arm, initial encounter: Secondary | ICD-10-CM | POA: Diagnosis present

## 2015-12-11 DIAGNOSIS — S299XXA Unspecified injury of thorax, initial encounter: Secondary | ICD-10-CM | POA: Diagnosis not present

## 2015-12-11 DIAGNOSIS — Y9301 Activity, walking, marching and hiking: Secondary | ICD-10-CM | POA: Insufficient documentation

## 2015-12-11 DIAGNOSIS — W010XXA Fall on same level from slipping, tripping and stumbling without subsequent striking against object, initial encounter: Secondary | ICD-10-CM | POA: Insufficient documentation

## 2015-12-11 DIAGNOSIS — F329 Major depressive disorder, single episode, unspecified: Secondary | ICD-10-CM | POA: Insufficient documentation

## 2015-12-11 DIAGNOSIS — S42031A Displaced fracture of lateral end of right clavicle, initial encounter for closed fracture: Secondary | ICD-10-CM | POA: Diagnosis not present

## 2015-12-11 DIAGNOSIS — S42352A Displaced comminuted fracture of shaft of humerus, left arm, initial encounter for closed fracture: Secondary | ICD-10-CM | POA: Diagnosis not present

## 2015-12-11 DIAGNOSIS — J449 Chronic obstructive pulmonary disease, unspecified: Secondary | ICD-10-CM | POA: Diagnosis not present

## 2015-12-11 DIAGNOSIS — Y92481 Parking lot as the place of occurrence of the external cause: Secondary | ICD-10-CM | POA: Diagnosis not present

## 2015-12-11 DIAGNOSIS — S0081XA Abrasion of other part of head, initial encounter: Secondary | ICD-10-CM | POA: Diagnosis not present

## 2015-12-11 DIAGNOSIS — F17200 Nicotine dependence, unspecified, uncomplicated: Secondary | ICD-10-CM | POA: Insufficient documentation

## 2015-12-11 DIAGNOSIS — Z791 Long term (current) use of non-steroidal anti-inflammatories (NSAID): Secondary | ICD-10-CM | POA: Diagnosis not present

## 2015-12-11 MED ORDER — OXYCODONE-ACETAMINOPHEN 5-325 MG PO TABS
1.0000 | ORAL_TABLET | Freq: Once | ORAL | Status: AC
  Administered 2015-12-11: 1 via ORAL
  Filled 2015-12-11: qty 1

## 2015-12-11 MED ORDER — OXYCODONE-ACETAMINOPHEN 5-325 MG PO TABS: 1.0000 | ORAL_TABLET | Freq: Four times a day (QID) | ORAL | Status: DC | PRN

## 2015-12-11 NOTE — Discharge Instructions (Signed)
Wear sling until evaluation by orthopedic Dr. Piedad Climes Fracture A clavicle fracture is a broken collarbone. The collarbone is the long bone that connects your shoulder to your rib cage. A broken collarbone may be treated with a sling, a wrap, or surgery. Treatment depends on whether the broken ends of the bone are out of place or not. HOME CARE  Put ice on the injured area:  Put ice in a plastic bag.  Place a towel between your skin and the bag.  Leave the ice on for 20 minutes, 2-3 times a day.  If you have a wrap or splint:  Wear it all the time, and remove it only to take a bath or shower.  When you bathe or shower, keep your shoulder in the same place as when the sling or wrap is on.  Do not lift your arm.  If you have a wrap:  Another person must tighten it every day.  It should be tight enough to hold your shoulders back.  Make sure you have enough room to put your pointer finger between your body and the strap.  Loosen the wrap right away if you cannot feel your arm or your hands tingle.  Only take medicines as told by your doctor.  Avoid activities that make the injury or pain worse for 4-6 weeks after surgery.  Keep all follow-up appointments. GET HELP IF:  Your medicine is not making you feel less pain.  Your medicine is not making swelling better. GET HELP RIGHT AWAY IF:   Your cannot feel your arm.  Your arm is cold.  Your arm is a lighter color than normal. MAKE SURE YOU:   Understand these instructions.  Will watch your condition.  Will get help right away if you are not doing well or get worse.   This information is not intended to replace advice given to you by your health care provider. Make sure you discuss any questions you have with your health care provider.   Document Released: 01/01/2008 Document Revised: 07/20/2013 Document Reviewed: 06/07/2013 Elsevier Interactive Patient Education [employer].

## 2015-12-11 NOTE — ED Notes (Signed)
Pt reports tripping and falling onto pavement parking lots; abrasions to nose, right eye, right shoulder, right elbow, and left knee. Pt reports pain to right shoulder that radiates down arm. Pt reports only blood thinner is 81mg  aspirin. Pt denies any LOC or hitting head.

## 2015-12-11 NOTE — ED Provider Notes (Signed)
[facility] Emergency Department Provider Note   ____________________________________________  Time seen: Approximately 5:53 PM  I have reviewed the triage vital signs and the nursing notes.   HISTORY  Chief Complaint Fall    HPI [name] [name] is a 70 y.o. female patient complain of facial contusion, right upper extremity pain, and left knee pain secondary to a fall. Patient states she tripped and fell while walking down a hill into a parking lot. Patient denies any loss of consciousness.  Patient is rating her pain as 8/10 from these multiple injuries. No palliative measures prior to arrival.   Past Medical History  Diagnosis Date  . COPD (chronic obstructive pulmonary disease) ([place])   . Hypertension   . CAD (coronary artery disease) unk  . Hypercholesteremia unk  . Depression   . Anxiety     Patient Active Problem List   Diagnosis Date Noted  . Osteoporosis 09/07/2015  . Pleural effusion 02/03/2015  . Rib fracture 02/02/2015  . Body mass index (BMI) of 23.0-23.9 in adult 12/29/2014  . Abnormal finding of blood chemistry 12/29/2014  . Gastric ulcer 12/29/2014  . H/O transient cerebral ischemia 12/29/2014  . HLD (hyperlipidemia) 12/29/2014  . BP (high blood pressure) 12/29/2014  . Below normal amount of sodium in the blood 12/29/2014  . Awareness of heartbeats 12/29/2014  . Abnormal Pap smear of vagina 12/29/2014  . Plantar fasciitis 12/29/2014  . Esophageal reflux 12/29/2014  . 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 TI (tricuspid incompetence) 12/31/2013  . Chest pain 12/31/2013  . Chronic obstructive pulmonary disease ([place]) 12/31/2013  . Abnormal blood sugar 03/01/2009  . Clinical depression 02/15/2009  . Current tobacco use 06/26/2006  . Episodic paroxysmal anxiety disorder 10/06/2005    Past Surgical History  Procedure Laterality Date  . Abdominal hysterectomy  1982    Current Outpatient Rx  Name  Route  Sig  Dispense   Refill  . albuterol (PROVENTIL HFA;VENTOLIN HFA) 108 (90 BASE) MCG/ACT inhaler   Inhalation   Inhale 2 puffs into the lungs every 4 (four) hours as needed for wheezing or shortness of breath.   1 Inhaler   1   . alendronate (FOSAMAX) 70 MG tablet   Oral   Take 1 tablet (70 mg total) by mouth every 7 (seven) days. Take with a full glass of water on an empty stomach.   4 tablet   11   . ALPRAZolam (XANAX) 0.5 MG tablet   Oral   Take 1 tablet (0.5 mg total) by mouth 2 (two) times daily as needed.   60 tablet   5   . aspirin 81 MG tablet   Oral   Take 81 mg by mouth daily.         Marland Kitchen buPROPion (WELLBUTRIN SR) 150 MG 12 hr tablet   Oral   Take 150 mg by mouth daily.          . citalopram (CELEXA) 20 MG tablet   Oral   Take 1 tablet (20 mg total) by mouth daily.   90 tablet   3   . Fluticasone-Salmeterol (ADVAIR) 100-50 MCG/DOSE AEPB   Inhalation   Inhale 1 puff into the lungs 2 (two) times daily.          Marland Kitchen lisinopril (PRINIVIL,ZESTRIL) 40 MG tablet   Oral   Take 40 mg by mouth daily.          . metoprolol succinate (TOPROL-XL) 50 MG 24 hr tablet   Oral  Take 1 tablet (50 mg total) by mouth daily.   90 tablet   3   . naproxen sodium (ANAPROX) 220 MG tablet   Oral   Take 220 mg by mouth 2 (two) times daily with a meal.         . nicotine (NICODERM CQ - DOSED IN MG/24 HOURS) 21 mg/24hr patch   Transdermal   Place 1 patch (21 mg total) onto the skin daily. Reported on 08/08/2015   28 patch   0   . pantoprazole (PROTONIX) 40 MG tablet   Oral   Take 1 tablet (40 mg total) by mouth daily.   31 tablet   11   . simvastatin (ZOCOR) 10 MG tablet   Oral   Take 1 tablet (10 mg total) by mouth daily.   90 tablet   3     Allergies Review of patient's allergies indicates no known allergies.  Family History  Problem Relation Age of Onset  . Leukemia Mother   . Aneurysm Father   . Diabetes Father   . Heart disease Father   . Diabetes Maternal  Grandmother   . Diabetes Paternal Grandmother     Social History Social History  Substance Use Topics  . Smoking status: Current Every Day Smoker -- 0.50 packs/day  . Smokeless tobacco: Never Used  . Alcohol Use: 9.0 oz/week    15 Glasses of wine per week     Comment: 2-3 glasses wine daily    Review of Systems Constitutional: No fever/chills Eyes: No visual changes. ENT: No sore throat.Facial pain Cardiovascular: Denies chest pain. Respiratory: Denies shortness of breath. Gastrointestinal: No abdominal pain.  No nausea, no vomiting.  No diarrhea.  No constipation. Genitourinary: Negative for dysuria. Musculoskeletal: Right upper and left lower extremity pain. Skin: Negative for rash. Facial abrasions Neurological: Negative for headaches, focal weakness or numbness. Psychiatric:Anxiety and depression Endocrine:Hypertension hyperlipidemia ____________________________________________   PHYSICAL EXAM:  VITAL SIGNS: ED Triage Vitals  Enc Vitals Group     BP 12/11/15 1726 155/77 mmHg     Pulse Rate 12/11/15 1726 73     Resp 12/11/15 1726 16     Temp 12/11/15 1726 97.8 F (36.6 C)     Temp Source 12/11/15 1726 Oral     SpO2 12/11/15 1726 95 %     Weight 12/11/15 1726 127 lb (57.607 kg)     Height 12/11/15 1726 5\' 3"  (1.6 m)     Head Cir --      Peak Flow --      Pain Score 12/11/15 1726 8     Pain Loc --      Pain Edu? --      Excl. in [place]? --     Constitutional: Alert and oriented. Well appearing and in no acute distress. Eyes: Conjunctivae are normal. PERRL. EOMI. Head: Atraumatic. Nose: No congestion/rhinnorhea. Abrasion and edema Mouth/Throat: Mucous membranes are moist.  Oropharynx non-erythematous. Neck: No stridor.  No cervical spine tenderness to palpation. Cardiovascular: Normal rate, regular rhythm. Grossly normal heart sounds.  Good peripheral circulation. Respiratory: Normal respiratory effort.  No retractions. Lungs CTAB. Gastrointestinal: Soft and  nontender. No distention. No abdominal bruits. No CVA tenderness. Musculoskeletal: No lower extremity tenderness nor edema.  No joint effusions. Neurologic:  Normal speech and language. No gross focal neurologic deficits are appreciated. No gait instability. Skin:  Skin is warm, dry and intact. No rash noted. Psychiatric: Mood and affect are normal. Speech and behavior are normal.  ____________________________________________  LABS (all labs ordered are listed, but only abnormal results are displayed)  Labs Reviewed - No data to display ____________________________________________  EKG   ____________________________________________  RADIOLOGY  Right clavicle and right humeral head fracture. CT of the head and neck and face were unremarkable. ____________________________________________   PROCEDURES  Procedure(s) performed: None  Critical Care performed: No  ____________________________________________   INITIAL IMPRESSION / ASSESSMENT AND PLAN / ED COURSE  Pertinent labs & imaging results that were available during my care of the patient were reviewed by me and considered in my medical decision making (see chart for details).  Right clavicle right humeral head fracture. Discussed x-ray finding with patient. Patient will contact orthopedic clinic in the morning for definitive follow-up care. Patient placed in a sling and given a prescription for Percocet. Patient advised return to ER for condition worsens. ____________________________________________   FINAL CLINICAL IMPRESSION(S) / ED DIAGNOSES  Final diagnoses:  Humeral head fracture, right, closed, initial encounter  Right clavicle fracture, closed, initial encounter      NEW MEDICATIONS STARTED DURING THIS VISIT:  New Prescriptions   No medications on file     Note:  This document was prepared using Dragon voice recognition software and may include unintentional dictation errors.    Sable Feil,  PA-C 12/11/15 [place], PA-C 12/11/15 [place], MD 12/11/15 [phone]

## 2015-12-11 NOTE — ED Notes (Signed)
See triage  [address] in parking lot  Abrasions noted to nose right eye shoulder and knee

## 2015-12-12 ENCOUNTER — Telehealth: Payer: Self-pay | Admitting: Family Medicine

## 2015-12-12 NOTE — Telephone Encounter (Signed)
Discussed what is available for resources.  Will call back after sees Dr. Marry Guan. Thanks.

## 2015-12-12 NOTE — Telephone Encounter (Signed)
Pt's daughter called saying mom fell in parking lot yesterday.  Broke clavicle bone and rt should bone.  Daughter thinks mom is going to needs outside assistance.  She wants to know if you can help her or give her information on getting someone in to help or maybe rehab center.  Daughters callback is [phone]  Hoyt Koch

## 2015-12-20 DIAGNOSIS — S42294A Other nondisplaced fracture of upper end of right humerus, initial encounter for closed fracture: Secondary | ICD-10-CM | POA: Diagnosis not present

## 2015-12-20 DIAGNOSIS — S42031A Displaced fracture of lateral end of right clavicle, initial encounter for closed fracture: Secondary | ICD-10-CM | POA: Diagnosis not present

## 2015-12-20 DIAGNOSIS — S4991XA Unspecified injury of right shoulder and upper arm, initial encounter: Secondary | ICD-10-CM | POA: Diagnosis not present

## 2016-01-17 DIAGNOSIS — S42294D Other nondisplaced fracture of upper end of right humerus, subsequent encounter for fracture with routine healing: Secondary | ICD-10-CM | POA: Diagnosis not present

## 2016-01-17 DIAGNOSIS — S42031D Displaced fracture of lateral end of right clavicle, subsequent encounter for fracture with routine healing: Secondary | ICD-10-CM | POA: Diagnosis not present

## 2016-01-23 ENCOUNTER — Other Ambulatory Visit: Payer: Self-pay | Admitting: Family Medicine

## 2016-02-05 ENCOUNTER — Other Ambulatory Visit: Payer: Self-pay | Admitting: Family Medicine

## 2016-02-05 DIAGNOSIS — I10 Essential (primary) hypertension: Secondary | ICD-10-CM

## 2016-02-05 DIAGNOSIS — M25511 Pain in right shoulder: Secondary | ICD-10-CM | POA: Diagnosis not present

## 2016-02-07 DIAGNOSIS — M25511 Pain in right shoulder: Secondary | ICD-10-CM | POA: Diagnosis not present

## 2016-02-13 ENCOUNTER — Other Ambulatory Visit: Payer: Self-pay | Admitting: Family Medicine

## 2016-02-13 DIAGNOSIS — F32A Depression, unspecified: Secondary | ICD-10-CM

## 2016-02-13 DIAGNOSIS — F329 Major depressive disorder, single episode, unspecified: Secondary | ICD-10-CM

## 2016-02-13 DIAGNOSIS — M25511 Pain in right shoulder: Secondary | ICD-10-CM | POA: Diagnosis not present

## 2016-02-15 DIAGNOSIS — M25511 Pain in right shoulder: Secondary | ICD-10-CM | POA: Diagnosis not present

## 2016-02-20 DIAGNOSIS — M25511 Pain in right shoulder: Secondary | ICD-10-CM | POA: Diagnosis not present

## 2016-02-23 DIAGNOSIS — M25511 Pain in right shoulder: Secondary | ICD-10-CM | POA: Diagnosis not present

## 2016-02-26 DIAGNOSIS — M25511 Pain in right shoulder: Secondary | ICD-10-CM | POA: Diagnosis not present

## 2016-02-28 DIAGNOSIS — M25511 Pain in right shoulder: Secondary | ICD-10-CM | POA: Diagnosis not present

## 2016-02-28 DIAGNOSIS — S42294D Other nondisplaced fracture of upper end of right humerus, subsequent encounter for fracture with routine healing: Secondary | ICD-10-CM | POA: Diagnosis not present

## 2016-03-05 DIAGNOSIS — M25511 Pain in right shoulder: Secondary | ICD-10-CM | POA: Diagnosis not present

## 2016-03-06 ENCOUNTER — Encounter: Payer: Self-pay | Admitting: Physician Assistant

## 2016-03-06 ENCOUNTER — Ambulatory Visit (INDEPENDENT_AMBULATORY_CARE_PROVIDER_SITE_OTHER): Payer: Medicare Other | Admitting: Physician Assistant

## 2016-03-06 VITALS — BP 128/78 | HR 60 | Temp 98.2°F | Resp 16 | Ht 63.5 in | Wt 125.0 lb

## 2016-03-06 DIAGNOSIS — R600 Localized edema: Secondary | ICD-10-CM | POA: Diagnosis not present

## 2016-03-06 DIAGNOSIS — J449 Chronic obstructive pulmonary disease, unspecified: Secondary | ICD-10-CM | POA: Diagnosis not present

## 2016-03-06 MED ORDER — FUROSEMIDE 20 MG PO TABS: 20.0000 mg | ORAL_TABLET | Freq: Every day | ORAL | 3 refills | Status: DC

## 2016-03-06 MED ORDER — FLUTICASONE-SALMETEROL 100-50 MCG/DOSE IN AEPB: 1.0000 | INHALATION_SPRAY | Freq: Two times a day (BID) | RESPIRATORY_TRACT | 6 refills | Status: DC

## 2016-03-06 NOTE — Progress Notes (Signed)
Patient: [name] [name] Female    DOB: 09-16-1945   70 y.o.   MRN: [id number] Visit Date: 03/06/2016  Today's Provider: Mar Daring, PA-C   Chief Complaint  Patient presents with  . Leg Swelling    and feet   Subjective:    HPI Edema: Patient complains of edema. The location of the edema is lower leg(s) bilateral, ankle(s) bilateral, feet bilateral.  The edema has been severe.  Onset of symptoms was 4 months ago, unchanged since that time. The edema is present all day. The patient states never.  The swelling has been aggravated by hot weather, relieved by cold compressions, and been associated with shortness of breath. Cardiac risk factors include hypertension and smoking/ tobacco exposure.  She did fall and fracture her right clavicle and humerus in 11/2015 and has not been as active as she once had been since this. She is going to PT and doing fairly well with that. She does have residual post-traumatic edema in the right forearm and hand but it is improving.     No Known Allergies No outpatient prescriptions have been marked as taking for the 03/06/16 encounter (Office Visit) with Mar Daring, PA-C.    Review of Systems  Constitutional: Negative.   Respiratory: Positive for shortness of breath. Negative for cough, chest tightness and wheezing.   Cardiovascular: Positive for leg swelling. Negative for chest pain and palpitations.  Gastrointestinal: Negative for abdominal pain.  Neurological: Negative for dizziness, light-headedness, numbness and headaches.    Social History  Substance Use Topics  . Smoking status: Current Every Day Smoker    Packs/day: 0.50  . Smokeless tobacco: Never Used  . Alcohol use 9.0 oz/week    15 Glasses of wine per week     Comment: 2-3 glasses wine daily   Objective:   BP 128/78 (BP Location: Left Arm, Patient Position: Sitting, Cuff Size: Normal)   Pulse 60   Temp 98.2 F (36.8 C) (Oral)   Resp 16   Ht 5' 3.5" (1.613 m)    Wt 125 lb (56.7 kg)   SpO2 96%   BMI 21.80 kg/m   Physical Exam  Constitutional: She appears well-developed and well-nourished. No distress.  Neck: Normal range of motion. Neck supple. No JVD present. No tracheal deviation present. No thyromegaly present.  Cardiovascular: Normal rate and regular rhythm.  Exam reveals no gallop and no friction rub.   Murmur heard. 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 Saralyn Pilar  Pulmonary/Chest: Effort normal. No respiratory distress. She has no wheezes. She has rhonchi (throughout; does have known COPD and has been out of her advair inhaler). She has no rales.  Musculoskeletal: She exhibits edema (1+ pitting edema to the mid calf bilaterally).  Lymphadenopathy:    She has no cervical adenopathy.  Skin: She is not diaphoretic.  Vitals reviewed.       Assessment & Plan:     1. Bilateral edema of lower extremity Will check labs as below and evaluate for heart failure. She is already on lisinopril 40mg . Will start furosemide as below. I will f/u with her in 2 weeks to see if she is improving. Also recommend f/u with Dr. Saralyn Pilar if BNP is elevated. She agrees. She is to call if symptoms worsen in the meantime. - CBC - Basic Metabolic Panel (BMET) - B Nat Peptide - furosemide (LASIX) 20 MG tablet; Take 1 tablet (20 mg total) by mouth daily.  Dispense: 30 tablet; Refill: 3  2. Chronic obstructive pulmonary disease, unspecified COPD type ([place]) Stable. Diagnosis pulled for medication refill. Continue current medical treatment plan. - Fluticasone-Salmeterol (ADVAIR) 100-50 MCG/DOSE AEPB; Inhale 1 puff into the lungs 2 (two) times daily.  Dispense: 60 each; Refill: [place], PA-C  [facility]

## 2016-03-06 NOTE — Patient Instructions (Signed)
Edema Edema is an abnormal buildup of fluids in your bodytissues. Edema is somewhatdependent on gravity to pull the fluid to the lowest place in your body. That makes the condition more common in the legs and thighs (lower extremities). Painless swelling of the feet and ankles is common and becomes more likely as you get older. It is also common in looser tissues, like around your eyes.  When the affected area is squeezed, the fluid may move out of that spot and leave a dent for a few moments. This dent is called pitting.  CAUSES  There are many possible causes of edema. Eating too much salt and being on your feet or sitting for a long time can cause edema in your legs and ankles. Hot weather may make edema worse. Common medical causes of edema include:  Heart failure.  Liver disease.  Kidney disease.  Weak blood vessels in your legs.  Cancer.  An injury.  Pregnancy.  Some medications.  Obesity. SYMPTOMS  Edema is usually painless.Your skin may look swollen or shiny.  DIAGNOSIS  Your health care provider may be able to diagnose edema by asking about your medical history and doing a physical exam. You may need to have tests such as X-rays, an electrocardiogram, or blood tests to check for medical conditions that may cause edema.  TREATMENT  Edema treatment depends on the cause. If you have heart, liver, or kidney disease, you need the treatment appropriate for these conditions. General treatment may include:  Elevation of the affected body part above the level of your heart.  Compression of the affected body part. Pressure from elastic bandages or support stockings squeezes the tissues and forces fluid back into the blood vessels. This keeps fluid from entering the tissues.  Restriction of fluid and salt intake.  Use of a water pill (diuretic). These medications are appropriate only for some types of edema. They pull fluid out of your body and make you urinate more often. This  gets rid of fluid and reduces swelling, but diuretics can have side effects. Only use diuretics as directed by your health care provider. HOME CARE INSTRUCTIONS   Keep the affected body part above the level of your heart when you are lying down.   Do not sit still or stand for prolonged periods.   Do not put anything directly under your knees when lying down.  Do not wear constricting clothing or garters on your upper legs.   Exercise your legs to work the fluid back into your blood vessels. This may help the swelling go down.   Wear elastic bandages or support stockings to reduce ankle swelling as directed by your health care provider.   Eat a low-salt diet to reduce fluid if your health care provider recommends it.   Only take medicines as directed by your health care provider. SEEK MEDICAL CARE IF:   Your edema is not responding to treatment.  You have heart, liver, or kidney disease and notice symptoms of edema.  You have edema in your legs that does not improve after elevating them.   You have sudden and unexplained weight gain. SEEK IMMEDIATE MEDICAL CARE IF:   You develop shortness of breath or chest pain.   You cannot breathe when you lie down.  You develop pain, redness, or warmth in the swollen areas.   You have heart, liver, or kidney disease and suddenly get edema.  You have a fever and your symptoms suddenly get worse. MAKE SURE YOU:     Understand these instructions.  Will watch your condition.  Will get help right away if you are not doing well or get worse.   This information is not intended to replace advice given to you by your health care provider. Make sure you discuss any questions you have with your health care provider.   Document Released: 07/15/2005 Document Revised: 08/05/2014 Document Reviewed: 05/07/2013 Elsevier Interactive Patient Education 2016 Elsevier Inc. Furosemide tablets What is this medicine? FUROSEMIDE (fyoor [place] se  mide) is a diuretic. It helps you make more urine and to lose salt and excess water from your body. This medicine is used to treat high blood pressure, and edema or swelling from heart, kidney, or liver disease. This medicine may be used for other purposes; ask your health care provider or pharmacist if you have questions. What should I tell my health care provider before I take this medicine? They need to know if you have any of these conditions: -abnormal blood electrolytes -diarrhea or vomiting -gout -heart disease -kidney disease, small amounts of urine, or difficulty passing urine -liver disease -thyroid disease -an unusual or allergic reaction to furosemide, sulfa drugs, other medicines, foods, dyes, or preservatives -pregnant or trying to get pregnant -breast-feeding How should I use this medicine? Take this medicine by mouth with a glass of water. Follow the directions on the prescription label. You may take this medicine with or without food. If it upsets your stomach, take it with food or milk. Do not take your medicine more often than directed. Remember that you will need to pass more urine after taking this medicine. Do not take your medicine at a time of day that will cause you problems. Do not take at bedtime. Talk to your pediatrician regarding the use of this medicine in children. While this drug may be prescribed for selected conditions, precautions do apply. Overdosage: If you think you have taken too much of this medicine contact a poison control center or emergency room at once. NOTE: This medicine is only for you. Do not share this medicine with others. What if I miss a dose? If you miss a dose, take it as soon as you can. If it is almost time for your next dose, take only that dose. Do not take double or extra doses. What may interact with this medicine? -aspirin and aspirin-like medicines -certain antibiotics -chloral  hydrate -cisplatin -cyclosporine -digoxin -diuretics -laxatives -lithium -medicines for blood pressure -medicines that relax muscles for surgery -methotrexate -NSAIDs, medicines for pain and inflammation like ibuprofen, naproxen, or indomethacin -phenytoin -steroid medicines like prednisone or cortisone -sucralfate -thyroid hormones This list may not describe all possible interactions. Give your health care provider a list of all the medicines, herbs, non-prescription drugs, or dietary supplements you use. Also tell them if you smoke, drink alcohol, or use illegal drugs. Some items may interact with your medicine. What should I watch for while using this medicine? Visit your doctor or health care professional for regular checks on your progress. Check your blood pressure regularly. Ask your doctor or health care professional what your blood pressure should be, and when you should contact him or her. If you are a diabetic, check your blood sugar as directed. You may need to be on a special diet while taking this medicine. Check with your doctor. Also, ask how many glasses of fluid you need to drink a day. You must not get dehydrated. You may get drowsy or dizzy. Do not drive, use machinery, or do anything  that needs mental alertness until you know how this drug affects you. Do not stand or sit up quickly, especially if you are an older patient. This reduces the risk of dizzy or fainting spells. Alcohol can make you more drowsy and dizzy. Avoid alcoholic drinks. This medicine can make you more sensitive to the sun. Keep out of the sun. If you cannot avoid being in the sun, wear protective clothing and use sunscreen. Do not use sun lamps or tanning beds/booths. What side effects may I notice from receiving this medicine? Side effects that you should report to your doctor or health care professional as soon as possible: -blood in urine or stools -dry mouth -fever or chills -hearing loss or  ringing in the ears -irregular heartbeat -muscle pain or weakness, cramps -skin rash -stomach upset, pain, or nausea -tingling or numbness in the hands or feet -unusually weak or tired -vomiting or diarrhea -yellowing of the eyes or skin Side effects that usually do not require medical attention (report to your doctor or health care professional if they continue or are bothersome): -headache -loss of appetite -unusual bleeding or bruising This list may not describe all possible side effects. Call your doctor for medical advice about side effects. You may report side effects to FDA at 1-800-FDA-1088. Where should I keep my medicine? Keep out of the reach of children. Store at room temperature between 15 and 30 degrees C (59 and 86 degrees F). Protect from light. Throw away any unused medicine after the expiration date. NOTE: This sheet is a summary. It may not cover all possible information. If you have questions about this medicine, talk to your doctor, pharmacist, or health care provider.    2016, Elsevier/[employer] Standard. (2014-10-05 13:49:50)

## 2016-03-07 ENCOUNTER — Telehealth: Payer: Self-pay

## 2016-03-07 LAB — CBC
Hematocrit: 37.8 % (ref 34.0–46.6)
Hemoglobin: 12.9 g/dL (ref 11.1–15.9)
MCH: 32 pg (ref 26.6–33.0)
MCHC: 34.1 g/dL (ref 31.5–35.7)
MCV: 94 fL (ref 79–97)
PLATELETS: 239 10*3/uL (ref 150–379)
RBC: 4.03 x10E6/uL (ref 3.77–5.28)
RDW: 13 % (ref 12.3–15.4)
WBC: 5.4 10*3/uL (ref 3.4–10.8)

## 2016-03-07 LAB — BRAIN NATRIURETIC PEPTIDE: BNP: 521.3 pg/mL — ABNORMAL HIGH (ref 0.0–100.0)

## 2016-03-07 LAB — BASIC METABOLIC PANEL
BUN / CREAT RATIO: 9 — AB (ref 12–28)
BUN: 7 mg/dL — ABNORMAL LOW (ref 8–27)
CALCIUM: 9.3 mg/dL (ref 8.7–10.3)
CHLORIDE: 95 mmol/L — AB (ref 96–106)
CO2: 27 mmol/L (ref 18–29)
Creatinine, Ser: 0.76 mg/dL (ref 0.57–1.00)
GFR, EST AFRICAN AMERICAN: 93 mL/min/{1.73_m2} (ref >59)
GFR, EST NON AFRICAN AMERICAN: 80 mL/min/{1.73_m2} (ref >59)
Glucose: 86 mg/dL (ref 65–99)
Potassium: 4.7 mmol/L (ref 3.5–5.2)
Sodium: 136 mmol/L (ref 134–144)

## 2016-03-07 NOTE — Telephone Encounter (Signed)
-----   Message from Mar Daring, [place] sent at 03/07/2016  2:10 PM EDT ----- BNP was elevated most likely indicating heart failure as we discussed. Continue furosemide and lisinopril. I do recommend following up with Dr. Saralyn Pilar as soon as possible.

## 2016-03-07 NOTE — Telephone Encounter (Signed)
Patient advised as directed below. Voiced understanding.  Thanks,  -Latanga Nedrow 

## 2016-03-07 NOTE — Telephone Encounter (Signed)
Pt returned your call. ° °ThanksTeri °

## 2016-03-07 NOTE — Telephone Encounter (Signed)
LMTCB  Thanks,  -Joseline 

## 2016-03-08 DIAGNOSIS — M25511 Pain in right shoulder: Secondary | ICD-10-CM | POA: Diagnosis not present

## 2016-03-12 DIAGNOSIS — M25511 Pain in right shoulder: Secondary | ICD-10-CM | POA: Diagnosis not present

## 2016-03-13 DIAGNOSIS — R0609 Other forms of dyspnea: Secondary | ICD-10-CM | POA: Diagnosis not present

## 2016-03-13 DIAGNOSIS — I340 Nonrheumatic mitral (valve) insufficiency: Secondary | ICD-10-CM | POA: Diagnosis not present

## 2016-03-13 DIAGNOSIS — I208 Other forms of angina pectoris: Secondary | ICD-10-CM | POA: Diagnosis not present

## 2016-03-13 DIAGNOSIS — I071 Rheumatic tricuspid insufficiency: Secondary | ICD-10-CM | POA: Diagnosis not present

## 2016-03-13 DIAGNOSIS — R600 Localized edema: Secondary | ICD-10-CM | POA: Insufficient documentation

## 2016-03-13 DIAGNOSIS — J410 Simple chronic bronchitis: Secondary | ICD-10-CM | POA: Diagnosis not present

## 2016-03-14 DIAGNOSIS — M25511 Pain in right shoulder: Secondary | ICD-10-CM | POA: Diagnosis not present

## 2016-03-19 DIAGNOSIS — M25511 Pain in right shoulder: Secondary | ICD-10-CM | POA: Diagnosis not present

## 2016-03-20 ENCOUNTER — Ambulatory Visit: Payer: Medicare Other | Admitting: Physician Assistant

## 2016-03-21 ENCOUNTER — Encounter: Payer: Self-pay | Admitting: Physician Assistant

## 2016-03-21 ENCOUNTER — Ambulatory Visit (INDEPENDENT_AMBULATORY_CARE_PROVIDER_SITE_OTHER): Payer: Medicare Other | Admitting: Physician Assistant

## 2016-03-21 VITALS — BP 130/70 | HR 72 | Temp 98.3°F | Resp 16 | Wt 123.8 lb

## 2016-03-21 DIAGNOSIS — R600 Localized edema: Secondary | ICD-10-CM | POA: Diagnosis not present

## 2016-03-21 DIAGNOSIS — M25511 Pain in right shoulder: Secondary | ICD-10-CM | POA: Diagnosis not present

## 2016-03-21 DIAGNOSIS — F32A Depression, unspecified: Secondary | ICD-10-CM

## 2016-03-21 DIAGNOSIS — F329 Major depressive disorder, single episode, unspecified: Secondary | ICD-10-CM

## 2016-03-21 MED ORDER — ALPRAZOLAM 0.5 MG PO TABS: 0.5000 mg | ORAL_TABLET | Freq: Two times a day (BID) | ORAL | 5 refills | Status: DC | PRN

## 2016-03-21 NOTE — Patient Instructions (Signed)

## 2016-03-21 NOTE — Progress Notes (Signed)
Patient: [name] [name] Female    DOB: 09-14-1945   70 y.o.   MRN: [id number] Visit Date: 03/21/2016  Today's Provider: Mar Daring, PA-C   Chief Complaint  Patient presents with  . Edema   Subjective:    HPI Patient returns to office today for follow up visit, patient was last seen in office on 03/06/16 and started on Furosemide. Patient reports that she did follow up with Dr.Paraschos who agreed that patient should be on Lasix, no changes were made at visit as he had stated to her he wanted to see how she did on medication. Patient reports great improvement with swelling over the past several days. Swelling does still occur when her legs are dependent for a long time.    No Known Allergies Current Meds  Medication Sig  . albuterol (PROVENTIL HFA;VENTOLIN HFA) 108 (90 BASE) MCG/ACT inhaler Inhale 2 puffs into the lungs every 4 (four) hours as needed for wheezing or shortness of breath.  Marland Kitchen alendronate (FOSAMAX) 70 MG tablet Take 1 tablet (70 mg total) by mouth every 7 (seven) days. Take with a full glass of water on an empty stomach.  . ALPRAZolam (XANAX) 0.5 MG tablet Take 1 tablet (0.5 mg total) by mouth 2 (two) times daily as needed.  Marland Kitchen aspirin 81 MG tablet Take 81 mg by mouth daily.  Marland Kitchen buPROPion (WELLBUTRIN SR) 150 MG 12 hr tablet Take 150 mg by mouth daily.   . citalopram (CELEXA) 20 MG tablet TAKE 1 TABLET BY MOUTH ONCE A DAY  . Fluticasone-Salmeterol (ADVAIR) 100-50 MCG/DOSE AEPB Inhale 1 puff into the lungs 2 (two) times daily.  . furosemide (LASIX) 20 MG tablet Take 1 tablet (20 mg total) by mouth daily.  Marland Kitchen lisinopril (PRINIVIL,ZESTRIL) 40 MG tablet Take 40 mg by mouth daily.   . metoprolol succinate (TOPROL-XL) 50 MG 24 hr tablet Take 1 tablet (50 mg total) by mouth daily. PATIENT NEEDS TO SCHEDULE OFFICE VISIT FOR FOLLOW UP  . naproxen sodium (ANAPROX) 220 MG tablet Take 220 mg by mouth 2 (two) times daily with a meal.  . nicotine (NICODERM CQ - DOSED IN MG/24  HOURS) 21 mg/24hr patch Place 1 patch (21 mg total) onto the skin daily. Reported on 08/08/2015  . oxyCODONE (OXY IR/ROXICODONE) 5 MG immediate release tablet TK 1 T PO Q 12 H PRN P  . oxyCODONE-acetaminophen (ROXICET) 5-325 MG tablet Take 1 tablet by mouth every 6 (six) hours as needed.  . pantoprazole (PROTONIX) 40 MG tablet Take 1 tablet (40 mg total) by mouth daily.  . simvastatin (ZOCOR) 10 MG tablet TAKE 1 TABLET BY MOUTH ONCE A DAY.    Review of Systems  Constitutional: Negative.   HENT: Negative.   Eyes: Negative.   Respiratory: Positive for cough, choking, chest tightness (patient reports that she has a Echo scheduled with doctor, tihis issue is being addressed) and wheezing. Negative for apnea, shortness of breath and stridor.   Cardiovascular: Positive for leg swelling (today patient reports is improved but has swelling of her ankles). Negative for chest pain and palpitations.  Gastrointestinal: Negative.   Endocrine: Negative.   Genitourinary: Negative.   Musculoskeletal: Positive for joint swelling, neck pain and neck stiffness. Negative for arthralgias, back pain, gait problem and myalgias.  Allergic/Immunologic: Negative.   Neurological: Negative.   Hematological: Negative.   Psychiatric/Behavioral: Negative.     Social History  Substance Use Topics  . Smoking status: Current Every Day Smoker  Packs/day: 0.50  . Smokeless tobacco: Never Used  . Alcohol use 9.0 oz/week    15 Glasses of wine per week     Comment: 2-3 glasses wine daily   Objective:   BP 130/70   Pulse 72   Temp 98.3 F (36.8 C) (Oral)   Resp 16   Wt 123 lb 12.8 oz (56.2 kg)   BMI 21.59 kg/m   Physical Exam  Constitutional: She appears well-developed and well-nourished. No distress.  Neck: Normal range of motion. Neck supple. No JVD present. No tracheal deviation present. No thyromegaly present.  Cardiovascular: Normal rate and regular rhythm.  Exam reveals no gallop and no friction rub.     Murmur heard.  Systolic murmur is present with a grade of 3/6  Pulmonary/Chest: Effort normal and breath sounds normal. No respiratory distress. She has no wheezes. She has no rales.  Musculoskeletal: She exhibits edema (Trace in right LE, none in left LE).  Lymphadenopathy:    She has no cervical adenopathy.  Skin: She is not diaphoretic.  Vitals reviewed.      Assessment & Plan:     1. Bilateral edema of lower extremity Patient is worried about taking medication daily. Will do a trial of lasix every other day at this time unless otherwise directed by cardiology, since she sees him again 04/10/16. Gave patient infor on potassium content in foods so she can eat a potassium-rich diet since she does not want to add potassium supplement just yet. Will recheck potassium in 2 months.  - furosemide (LASIX) 20 MG tablet; Take 1 tablet (20 mg total) by mouth every other day.  Dispense: 30 tablet; Refill: 3  2. Clinical depression Stable. Diagnosis pulled for medication refill. Continue current medical treatment plan. - ALPRAZolam (XANAX) 0.5 MG tablet; Take 1 tablet (0.5 mg total) by mouth 2 (two) times daily as needed.  Dispense: 60 tablet; Refill: [place], PA-C  [facility]

## 2016-03-26 DIAGNOSIS — M25511 Pain in right shoulder: Secondary | ICD-10-CM | POA: Diagnosis not present

## 2016-03-29 DIAGNOSIS — M25511 Pain in right shoulder: Secondary | ICD-10-CM | POA: Diagnosis not present

## 2016-04-02 DIAGNOSIS — R0609 Other forms of dyspnea: Secondary | ICD-10-CM | POA: Diagnosis not present

## 2016-04-02 DIAGNOSIS — R600 Localized edema: Secondary | ICD-10-CM | POA: Diagnosis not present

## 2016-04-02 DIAGNOSIS — M25511 Pain in right shoulder: Secondary | ICD-10-CM | POA: Diagnosis not present

## 2016-04-05 DIAGNOSIS — M25511 Pain in right shoulder: Secondary | ICD-10-CM | POA: Diagnosis not present

## 2016-04-08 DIAGNOSIS — I340 Nonrheumatic mitral (valve) insufficiency: Secondary | ICD-10-CM | POA: Diagnosis not present

## 2016-04-08 DIAGNOSIS — J410 Simple chronic bronchitis: Secondary | ICD-10-CM | POA: Diagnosis not present

## 2016-04-08 DIAGNOSIS — I071 Rheumatic tricuspid insufficiency: Secondary | ICD-10-CM | POA: Diagnosis not present

## 2016-04-08 DIAGNOSIS — R55 Syncope and collapse: Secondary | ICD-10-CM | POA: Diagnosis not present

## 2016-04-08 DIAGNOSIS — R600 Localized edema: Secondary | ICD-10-CM | POA: Diagnosis not present

## 2016-04-08 DIAGNOSIS — I208 Other forms of angina pectoris: Secondary | ICD-10-CM | POA: Diagnosis not present

## 2016-04-08 DIAGNOSIS — R0609 Other forms of dyspnea: Secondary | ICD-10-CM | POA: Diagnosis not present

## 2016-04-10 DIAGNOSIS — S42294D Other nondisplaced fracture of upper end of right humerus, subsequent encounter for fracture with routine healing: Secondary | ICD-10-CM | POA: Diagnosis not present

## 2016-04-10 DIAGNOSIS — M25511 Pain in right shoulder: Secondary | ICD-10-CM | POA: Diagnosis not present

## 2016-04-11 ENCOUNTER — Other Ambulatory Visit: Payer: Self-pay | Admitting: Student

## 2016-04-11 DIAGNOSIS — S42294D Other nondisplaced fracture of upper end of right humerus, subsequent encounter for fracture with routine healing: Secondary | ICD-10-CM

## 2016-04-16 DIAGNOSIS — M25511 Pain in right shoulder: Secondary | ICD-10-CM | POA: Diagnosis not present

## 2016-04-18 ENCOUNTER — Telehealth: Payer: Self-pay

## 2016-04-18 NOTE — Telephone Encounter (Signed)
Patient called to get lab results from 02/2016. Patient advised.

## 2016-04-29 ENCOUNTER — Ambulatory Visit
Admission: RE | Admit: 2016-04-29 | Discharge: 2016-04-29 | Disposition: A | Discharge status: Home / Self Care | Payer: Medicare Other | Source: Ambulatory Visit | Attending: Student | Admitting: Student

## 2016-04-29 DIAGNOSIS — S42294D Other nondisplaced fracture of upper end of right humerus, subsequent encounter for fracture with routine healing: Secondary | ICD-10-CM | POA: Insufficient documentation

## 2016-04-29 DIAGNOSIS — R609 Edema, unspecified: Secondary | ICD-10-CM | POA: Insufficient documentation

## 2016-04-29 DIAGNOSIS — S42211A Unspecified displaced fracture of surgical neck of right humerus, initial encounter for closed fracture: Secondary | ICD-10-CM | POA: Diagnosis not present

## 2016-04-29 DIAGNOSIS — S42031K Displaced fracture of lateral end of right clavicle, subsequent encounter for fracture with nonunion: Secondary | ICD-10-CM | POA: Diagnosis not present

## 2016-04-30 DIAGNOSIS — M25511 Pain in right shoulder: Secondary | ICD-10-CM | POA: Diagnosis not present

## 2016-05-02 ENCOUNTER — Other Ambulatory Visit: Payer: Self-pay | Admitting: Family Medicine

## 2016-05-02 DIAGNOSIS — I10 Essential (primary) hypertension: Secondary | ICD-10-CM

## 2016-05-22 ENCOUNTER — Other Ambulatory Visit: Payer: Self-pay | Admitting: Gastroenterology

## 2016-05-22 ENCOUNTER — Other Ambulatory Visit: Payer: Self-pay | Admitting: Family Medicine

## 2016-05-22 DIAGNOSIS — F32A Depression, unspecified: Secondary | ICD-10-CM

## 2016-05-22 DIAGNOSIS — F329 Major depressive disorder, single episode, unspecified: Secondary | ICD-10-CM

## 2016-05-22 DIAGNOSIS — K219 Gastro-esophageal reflux disease without esophagitis: Secondary | ICD-10-CM

## 2016-05-23 ENCOUNTER — Encounter: Payer: Self-pay | Admitting: Physician Assistant

## 2016-05-23 ENCOUNTER — Ambulatory Visit (INDEPENDENT_AMBULATORY_CARE_PROVIDER_SITE_OTHER): Payer: Medicare Other | Admitting: Physician Assistant

## 2016-05-23 VITALS — BP 100/68 | HR 78 | Temp 98.2°F | Resp 16 | Ht 64.0 in | Wt 122.0 lb

## 2016-05-23 DIAGNOSIS — I10 Essential (primary) hypertension: Secondary | ICD-10-CM

## 2016-05-23 DIAGNOSIS — F331 Major depressive disorder, recurrent, moderate: Secondary | ICD-10-CM

## 2016-05-23 DIAGNOSIS — R600 Localized edema: Secondary | ICD-10-CM | POA: Diagnosis not present

## 2016-05-23 DIAGNOSIS — Z23 Encounter for immunization: Secondary | ICD-10-CM | POA: Diagnosis not present

## 2016-05-23 NOTE — Progress Notes (Signed)
Patient: [name] [name] Female    DOB: Apr 07, 1946   70 y.o.   MRN: [id number] Visit Date: 05/23/2016  Today's Provider: Mar Daring, PA-C   Chief Complaint  Patient presents with  . Depression  . Leg Swelling   Subjective:    HPI  Depression, Follow-up  She  was last seen for this 2 months ago. Changes made at last visit include no changes.   She reports excellent compliance with treatment. She is not having side effects.   She reports excellent tolerance of treatment. Current symptoms include: hopelesness at times She feels she is Unchanged since last visit. She feels this is worsened since over the last 3 weeks. She fractured her humerus in May and has been treating conservatively per Ortho. Xrays were showing normal, routine healing but she was continuing to have pain. 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 She has had to discontinue her PT for now as that may be prolonging healing. She is going to undergo [place] treatments to promote bone growth. If no results with that she will have to have surgery to lessen her pain. She does not wish to have surgery at this time. She reports this has been causing her to lash out at close family members. She also does not like being reliant upon her daughter for everything. She is on wellbutrin 150mg  BID and celexa 20mg . She reports she does not take the wellbutrin as she is suppose to. Most days she only takes one, some days none.   ------------------------------------------------------------------------    Follow up for edema  The patient was last seen for this 2 months ago. Changes made at last visit include st Lasix.  She reports excellent compliance with treatment. She feels that condition is Improved. She is not having side effects.   ------------------------------------------------------------------------------------   Hypertension, follow-up:  BP Readings from Last 3 Encounters:    05/23/16 100/68  03/21/16 130/70  03/06/16 128/78    She was last seen for hypertension 2 months ago.  BP at that visit was 130/70. Management changes since that visit include no changes. She reports excellent compliance with treatment. She is not having side effects.  She is not exercising. She is adherent to low salt diet.   Outside blood pressures are stable. She is experiencing none.  Patient denies chest pain.   Cardiovascular risk factors include advanced age (older than 19 for men, 32 for women), hypertension and smoking/ tobacco exposure.  Use of agents associated with hypertension: none.     Weight trend: increasing steadily Wt Readings from Last 3 Encounters:  05/23/16 122 lb (55.3 kg)  03/21/16 123 lb 12.8 oz (56.2 kg)  03/06/16 125 lb (56.7 kg)    Current diet: in general, an "unhealthy" diet  ------------------------------------------------------------------------     No Known Allergies   Current Outpatient Prescriptions:  .  albuterol (PROVENTIL HFA;VENTOLIN HFA) 108 (90 BASE) MCG/ACT inhaler, Inhale 2 puffs into the lungs every 4 (four) hours as needed for wheezing or shortness of breath., Disp: 1 Inhaler, Rfl: 1 .  ALPRAZolam (XANAX) 0.5 MG tablet, TAKE 1 TABLET BY MOUTH TWICE DAILY AS NEEDED., Disp: 60 tablet, Rfl: 5 .  aspirin 81 MG tablet, Take 81 mg by mouth daily., Disp: , Rfl:  .  buPROPion (WELLBUTRIN SR) 150 MG 12 hr tablet, Take 150 mg by mouth daily. , Disp: , Rfl:  .  citalopram (CELEXA) 20 MG tablet, TAKE 1 TABLET BY MOUTH  ONCE A DAY, Disp: 90 tablet, Rfl: 1 .  Fluticasone-Salmeterol (ADVAIR) 100-50 MCG/DOSE AEPB, Inhale 1 puff into the lungs 2 (two) times daily., Disp: 60 each, Rfl: 6 .  furosemide (LASIX) 20 MG tablet, Take 1 tablet (20 mg total) by mouth every other day., Disp: 30 tablet, Rfl: 3 .  lisinopril (PRINIVIL,ZESTRIL) 40 MG tablet, Take 40 mg by mouth daily. , Disp: , Rfl:  .  metoprolol succinate (TOPROL-XL) 50 MG 24 hr tablet,  TAKE 1 TABLET(50 MG) BY MOUTH DAILY. FOLLOW UP, Disp: 90 tablet, Rfl: 1 .  naproxen sodium (ANAPROX) 220 MG tablet, Take 220 mg by mouth 2 (two) times daily with a meal., Disp: , Rfl:  .  nicotine (NICODERM CQ - DOSED IN MG/24 HOURS) 21 mg/24hr patch, Place 1 patch (21 mg total) onto the skin daily. Reported on 08/08/2015, Disp: 28 patch, Rfl: 0 .  pantoprazole (PROTONIX) 40 MG tablet, Take 1 tablet (40 mg total) by mouth daily., Disp: 31 tablet, Rfl: 11 .  simvastatin (ZOCOR) 10 MG tablet, TAKE 1 TABLET BY MOUTH ONCE A DAY., Disp: 90 tablet, Rfl: 3 .  traMADol (ULTRAM) 50 MG tablet, Take 1 tablet by mouth as needed., Disp: , Rfl:   Review of Systems  Constitutional: Negative.   HENT: Positive for congestion.   Respiratory: Positive for shortness of breath and wheezing.   Cardiovascular: Positive for leg swelling.  Psychiatric/Behavioral: Positive for agitation. The patient is nervous/anxious.     Social History  Substance Use Topics  . Smoking status: Current Every Day Smoker    Packs/day: 0.50  . Smokeless tobacco: Never Used  . Alcohol use 9.0 oz/week    15 Glasses of wine per week     Comment: 2-3 glasses wine daily   Objective:   BP 100/68 (BP Location: Left Arm, Patient Position: Sitting, Cuff Size: Normal)   Pulse 78   Temp 98.2 F (36.8 C) (Oral)   Resp 16   Ht 5\' 4"  (1.626 m)   Wt 122 lb (55.3 kg)   SpO2 97%   BMI 20.94 kg/m   Physical Exam  Constitutional: She appears well-developed and well-nourished. No distress.  Neck: Normal range of motion. Neck supple.  Cardiovascular: Normal rate and regular rhythm.  Exam reveals no gallop and no friction rub.   Murmur (2/6) heard. Pulmonary/Chest: Effort normal and breath sounds normal. No respiratory distress. She has no wheezes. She has no rales.  Musculoskeletal: She exhibits no edema.  Skin: She is not diaphoretic.  Psychiatric: Her speech is normal and behavior is normal. Judgment and thought content normal. Cognition  and memory are normal. She exhibits a depressed mood (flat).  Vitals reviewed.     Assessment & Plan:     1. Essential hypertension Stable. Continue lisinopril 40mg  and metoprolol 50mg . Will recheck in 3 months at CPE.  2. Pedal edema Stable.  Continue current medical treatment plan of lasix 20mg  every other day. Will check CMP as below to check potassium. - Comprehensive Metabolic Panel (CMET)  3. Moderate episode of recurrent major depressive disorder (HCC) Worsening. Discussed making sure to take wellbutrin 150mg  BID as directed and continue celexa. She is to call if symptoms worsen.   4. Need for influenza vaccination Flu vaccine given today without complication. Patient sat upright for 15 minutes to check for adverse reaction before being released. - Flu vaccine HIGH DOSE PF (Fluzone High dose)  5. Need for pneumococcal vaccination Pneumococcal 23 vaccine given to patient without complications. Patient  sat for 15 minutes after administration and was tolerated well without adverse effects. - Pneumococcal polysaccharide vaccine 23-valent greater than or equal to 2yo subcutaneous/IM       Mar Daring, PA-C  [facility]

## 2016-05-23 NOTE — Patient Instructions (Signed)
Edema °Edema is an abnormal buildup of fluids in your body tissues. Edema is somewhat dependent on gravity to pull the fluid to the lowest place in your body. That makes the condition more common in the legs and thighs (lower extremities). Painless swelling of the feet and ankles is common and becomes more likely as you get older. It is also common in looser tissues, like around your eyes.  °When the affected area is squeezed, the fluid may move out of that spot and leave a dent for a few moments. This dent is called pitting.  °CAUSES  °There are many possible causes of edema. Eating too much salt and being on your feet or sitting for a long time can cause edema in your legs and ankles. Hot weather may make edema worse. Common medical causes of edema include: °· Heart failure. °· Liver disease. °· Kidney disease. °· Weak blood vessels in your legs. °· Cancer. °· An injury. °· Pregnancy. °· Some medications. °· Obesity.  °SYMPTOMS  °Edema is usually painless. Your skin may look swollen or shiny.  °DIAGNOSIS  °Your health care provider may be able to diagnose edema by asking about your medical history and doing a physical exam. You may need to have tests such as X-rays, an electrocardiogram, or blood tests to check for medical conditions that may cause edema.  °TREATMENT  °Edema treatment depends on the cause. If you have heart, liver, or kidney disease, you need the treatment appropriate for these conditions. General treatment may include: °· Elevation of the affected body part above the level of your heart. °· Compression of the affected body part. Pressure from elastic bandages or support stockings squeezes the tissues and forces fluid back into the blood vessels. This keeps fluid from entering the tissues. °· Restriction of fluid and salt intake. °· Use of a water pill (diuretic). These medications are appropriate only for some types of edema. They pull fluid out of your body and make you urinate more often. This  gets rid of fluid and reduces swelling, but diuretics can have side effects. Only use diuretics as directed by your health care provider. °HOME CARE INSTRUCTIONS  °· Keep the affected body part above the level of your heart when you are lying down.   °· Do not sit still or stand for prolonged periods.   °· Do not put anything directly under your knees when lying down. °· Do not wear constricting clothing or garters on your upper legs.   °· Exercise your legs to work the fluid back into your blood vessels. This may help the swelling go down.   °· Wear elastic bandages or support stockings to reduce ankle swelling as directed by your health care provider.   °· Eat a low-salt diet to reduce fluid if your health care provider recommends it.   °· Only take medicines as directed by your health care provider.  °SEEK MEDICAL CARE IF:  °· Your edema is not responding to treatment. °· You have heart, liver, or kidney disease and notice symptoms of edema. °· You have edema in your legs that does not improve after elevating them.   °· You have sudden and unexplained weight gain. °SEEK IMMEDIATE MEDICAL CARE IF:  °· You develop shortness of breath or chest pain.   °· You cannot breathe when you lie down. °· You develop pain, redness, or warmth in the swollen areas.   °· You have heart, liver, or kidney disease and suddenly get edema. °· You have a fever and your symptoms suddenly get worse. °MAKE SURE YOU:  °·   Understand these instructions. °· Will watch your condition. °· Will get help right away if you are not doing well or get worse. °  °This information is not intended to replace advice given to you by your health care provider. Make sure you discuss any questions you have with your health care provider. °  °Document Released: 07/15/2005 Document Revised: 08/05/2014 Document Reviewed: 05/07/2013 °Elsevier Interactive Patient Education ©2016 Elsevier Inc[employer] ° °

## 2016-05-23 NOTE — Telephone Encounter (Signed)
rx called into pharmacy

## 2016-05-24 ENCOUNTER — Telehealth: Payer: Self-pay

## 2016-05-24 LAB — COMPREHENSIVE METABOLIC PANEL
ALK PHOS: 95 IU/L (ref 39–117)
ALT: 11 IU/L (ref 0–32)
AST: 19 IU/L (ref 0–40)
Albumin/Globulin Ratio: 1.4 (ref 1.2–2.2)
Albumin: 4.1 g/dL (ref 3.6–4.8)
BUN/Creatinine Ratio: 20 (ref 12–28)
BUN: 16 mg/dL (ref 8–27)
Bilirubin Total: 0.3 mg/dL (ref 0.0–1.2)
CO2: 23 mmol/L (ref 18–29)
CREATININE: 0.79 mg/dL (ref 0.57–1.00)
Calcium: 9.2 mg/dL (ref 8.7–10.3)
Chloride: 93 mmol/L — ABNORMAL LOW (ref 96–106)
GFR calc Af Amer: 88 mL/min/{1.73_m2} (ref >59)
GFR calc non Af Amer: 77 mL/min/{1.73_m2} (ref >59)
GLUCOSE: 91 mg/dL (ref 65–99)
Globulin, Total: 3 g/dL (ref 1.5–4.5)
Potassium: 3.4 mmol/L — ABNORMAL LOW (ref 3.5–5.2)
Sodium: 138 mmol/L (ref 134–144)
Total Protein: 7.1 g/dL (ref 6.0–8.5)

## 2016-05-24 NOTE — Telephone Encounter (Signed)
-----   Message from Mar Daring, [place] sent at 05/24/2016  1:13 PM EDT ----- Potassium is borderline low at 3.4. Low normal is 3.5. Recommend increasing potassium rich foods like bananas, sweet potato, white potato, white beans, black beans, spinach, and yogurt. We should recheck in 4 weeks. If still decreasing then we will add a potassium supplement.

## 2016-05-24 NOTE — Telephone Encounter (Signed)
Patient advised as below. Patient verbalizes understanding and is in agreement with treatment plan.  

## 2016-05-27 ENCOUNTER — Ambulatory Visit (INDEPENDENT_AMBULATORY_CARE_PROVIDER_SITE_OTHER): Payer: Medicare Other | Admitting: Physician Assistant

## 2016-05-27 ENCOUNTER — Encounter: Payer: Self-pay | Admitting: Physician Assistant

## 2016-05-27 VITALS — BP 146/68 | HR 64 | Temp 98.4°F | Resp 16 | Wt 124.0 lb

## 2016-05-27 DIAGNOSIS — M79632 Pain in left forearm: Secondary | ICD-10-CM

## 2016-05-27 NOTE — Patient Instructions (Signed)

## 2016-05-27 NOTE — Progress Notes (Signed)
Patient: [name] [name] Female    DOB: [birth year]   70 y.o.   MRN: [id number] Visit Date: 05/27/2016  Today's Provider: Trinna Post, PA-C   No chief complaint on file.  Subjective:    HPI  Pt comes in today complaining of left forearm pain swelling and redness.  Pt reports having a pneumonia, flu vaccines, and blood drawn from her left arm Thursday.  Friday morning when she woke up she states her arm was painful, red, swollen, and hot to the touch.  She says it has improved some yesterday and today.  She reports the pain on Friday as 8/10 with inability to move. Today she reports the pain is 2/10 and she is able to move her arm. She reports she has some injection site pain still. No fever, chills, nausea, vomiting, diarrhea. No history of trauma in this arm.     No Known Allergies   Current Outpatient Prescriptions:  .  albuterol (PROVENTIL HFA;VENTOLIN HFA) 108 (90 BASE) MCG/ACT inhaler, Inhale 2 puffs into the lungs every 4 (four) hours as needed for wheezing or shortness of breath., Disp: 1 Inhaler, Rfl: 1 .  ALPRAZolam (XANAX) 0.5 MG tablet, TAKE 1 TABLET BY MOUTH TWICE DAILY AS NEEDED., Disp: 60 tablet, Rfl: 5 .  aspirin 81 MG tablet, Take 81 mg by mouth daily., Disp: , Rfl:  .  buPROPion (WELLBUTRIN SR) 150 MG 12 hr tablet, Take 150 mg by mouth daily. , Disp: , Rfl:  .  citalopram (CELEXA) 20 MG tablet, TAKE 1 TABLET BY MOUTH ONCE A DAY, Disp: 90 tablet, Rfl: 1 .  Fluticasone-Salmeterol (ADVAIR) 100-50 MCG/DOSE AEPB, Inhale 1 puff into the lungs 2 (two) times daily., Disp: 60 each, Rfl: 6 .  furosemide (LASIX) 20 MG tablet, Take 1 tablet (20 mg total) by mouth every other day., Disp: 30 tablet, Rfl: 3 .  lisinopril (PRINIVIL,ZESTRIL) 40 MG tablet, Take 40 mg by mouth daily. , Disp: , Rfl:  .  metoprolol succinate (TOPROL-XL) 50 MG 24 hr tablet, TAKE 1 TABLET(50 MG) BY MOUTH DAILY. FOLLOW UP, Disp: 90 tablet, Rfl: 1 .  naproxen sodium (ANAPROX) 220 MG tablet, Take 220  mg by mouth 2 (two) times daily with a meal., Disp: , Rfl:  .  nicotine (NICODERM CQ - DOSED IN MG/24 HOURS) 21 mg/24hr patch, Place 1 patch (21 mg total) onto the skin daily. Reported on 08/08/2015, Disp: 28 patch, Rfl: 0 .  pantoprazole (PROTONIX) 40 MG tablet, Take 1 tablet (40 mg total) by mouth daily. PT NEEDS APPT FOR ADDITIONAL REFILLS, Disp: 30 tablet, Rfl: 0 .  simvastatin (ZOCOR) 10 MG tablet, TAKE 1 TABLET BY MOUTH ONCE A DAY., Disp: 90 tablet, Rfl: 3 .  traMADol (ULTRAM) 50 MG tablet, Take 1 tablet by mouth as needed., Disp: , Rfl:   Review of Systems  Constitutional: Positive for fatigue (Chronic issue). Negative for activity change, appetite change, chills, diaphoresis, fever and unexpected weight change.  Cardiovascular: Negative for leg swelling.  Musculoskeletal: Positive for myalgias. Negative for arthralgias, back pain, gait problem, joint swelling, neck pain and neck stiffness.  Neurological: Negative for dizziness, light-headedness and headaches.    Social History  Substance Use Topics  . Smoking status: Current Every Day Smoker    Packs/day: 0.50  . Smokeless tobacco: Never Used  . Alcohol use 9.0 oz/week    15 Glasses of wine per week     Comment: 2-3 glasses wine daily  Objective:   BP (!) 146/68 (BP Location: Left Arm, Patient Position: Sitting, Cuff Size: Normal)   Pulse 64   Temp 98.4 F (36.9 C) (Oral)   Resp 16   Wt 124 lb (56.2 kg)   BMI 21.28 kg/m   Physical Exam  Constitutional: She is oriented to person, place, and time. She appears well-developed and well-nourished.  Cardiovascular: Normal rate and regular rhythm.   Pulmonary/Chest: Effort normal.  Musculoskeletal:       Right shoulder: Normal.       Left shoulder: Normal.       Right elbow: Normal.      Left elbow: Normal.       Right wrist: Normal.       Left wrist: Normal.       Right upper arm: Normal.       Left upper arm: Normal.       Right forearm: Normal.       Left forearm:  Normal.       Right hand: Normal.       Left hand: Normal.  Neurological: She is alert and oriented to person, place, and time. No sensory deficit.  Skin: Skin is warm and dry. No rash noted. No erythema.  Psychiatric: She has a normal mood and affect. Her behavior is normal.        Assessment & Plan:      Problem List Items Addressed This Visit    None    Visit Diagnoses    Left forearm pain    -  Primary     Problem List Items Addressed This Visit    None    Visit Diagnoses    Left forearm pain    -  Primary      Patient is 70 y/o female presenting with left forearm pain. Unclear etiology, arm appears to be normal on exam today. Possible cellulitis, but complaints seem to have resolved. Discussed with patient and her daughter present today in office about possible warning signs of infection. Patient agrees to try watchful waiting and to call back if she experiences worsening of symptoms.   Return if symptoms worsen or fail to improve.  Patient Instructions  Cellulitis Cellulitis is an infection of the skin and the tissue beneath it. The infected area is usually red and tender. Cellulitis occurs most often in the arms and lower legs.  CAUSES  Cellulitis is caused by bacteria that enter the skin through cracks or cuts in the skin. The most common types of bacteria that cause cellulitis are staphylococci and streptococci. SIGNS AND SYMPTOMS   Redness and warmth.  Swelling.  Tenderness or pain.  Fever. DIAGNOSIS  Your health care provider can usually determine what is wrong based on a physical exam. Blood tests may also be done. TREATMENT  Treatment usually involves taking an antibiotic medicine. HOME CARE INSTRUCTIONS   Take your antibiotic medicine as directed by your health care provider. Finish the antibiotic even if you start to feel better.  Keep the infected arm or leg elevated to reduce swelling.  Apply a warm cloth to the affected area up to 4 times per  day to relieve pain.  Take medicines only as directed by your health care provider.  Keep all follow-up visits as directed by your health care provider. SEEK MEDICAL CARE IF:   You notice red streaks coming from the infected area.  Your red area gets larger or turns dark in color.  Your bone or joint  underneath the infected area becomes painful after the skin has healed.  Your infection returns in the same area or another area.  You notice a swollen bump in the infected area.  You develop new symptoms.  You have a fever. SEEK IMMEDIATE MEDICAL CARE IF:   You feel very sleepy.  You develop vomiting or diarrhea.  You have a general ill feeling (malaise) with muscle aches and pains.   This information is not intended to replace advice given to you by your health care provider. Make sure you discuss any questions you have with your health care provider.   Document Released: 04/24/2005 Document Revised: 04/05/2015 Document Reviewed: 09/30/2011 Elsevier Interactive Patient Education [employer].    The entirety of the information documented in the History of Present Illness, Review of Systems and Physical Exam were personally obtained by me. Portions of this information were initially documented by Ashley Royalty, CMA and reviewed by me for thoroughness and accuracy.         Trinna Post, PA-C  [facility]

## 2016-05-31 DIAGNOSIS — S42294K Other nondisplaced fracture of upper end of right humerus, subsequent encounter for fracture with nonunion: Secondary | ICD-10-CM | POA: Diagnosis not present

## 2016-05-31 DIAGNOSIS — M810 Age-related osteoporosis without current pathological fracture: Secondary | ICD-10-CM | POA: Diagnosis not present

## 2016-06-25 ENCOUNTER — Other Ambulatory Visit: Payer: Self-pay | Admitting: Gastroenterology

## 2016-06-25 DIAGNOSIS — K219 Gastro-esophageal reflux disease without esophagitis: Secondary | ICD-10-CM

## 2016-07-12 DIAGNOSIS — S42031K Displaced fracture of lateral end of right clavicle, subsequent encounter for fracture with nonunion: Secondary | ICD-10-CM | POA: Diagnosis not present

## 2016-07-12 DIAGNOSIS — M7581 Other shoulder lesions, right shoulder: Secondary | ICD-10-CM | POA: Diagnosis not present

## 2016-07-12 DIAGNOSIS — S42294D Other nondisplaced fracture of upper end of right humerus, subsequent encounter for fracture with routine healing: Secondary | ICD-10-CM | POA: Diagnosis not present

## 2016-08-14 ENCOUNTER — Encounter: Payer: Medicare Other | Admitting: Physician Assistant

## 2016-08-20 DIAGNOSIS — H524 Presbyopia: Secondary | ICD-10-CM | POA: Diagnosis not present

## 2016-08-28 ENCOUNTER — Encounter: Payer: Self-pay | Admitting: Physician Assistant

## 2016-08-28 ENCOUNTER — Ambulatory Visit (INDEPENDENT_AMBULATORY_CARE_PROVIDER_SITE_OTHER): Payer: Medicare Other | Admitting: Physician Assistant

## 2016-08-28 VITALS — BP 120/80 | HR 70 | Temp 98.0°F | Resp 16 | Ht 63.0 in | Wt 127.0 lb

## 2016-08-28 DIAGNOSIS — Z0000 Encounter for general adult medical examination without abnormal findings: Secondary | ICD-10-CM | POA: Diagnosis not present

## 2016-08-28 DIAGNOSIS — F1010 Alcohol abuse, uncomplicated: Secondary | ICD-10-CM | POA: Diagnosis not present

## 2016-08-28 DIAGNOSIS — Z1231 Encounter for screening mammogram for malignant neoplasm of breast: Secondary | ICD-10-CM | POA: Diagnosis not present

## 2016-08-28 DIAGNOSIS — Z720 Tobacco use: Secondary | ICD-10-CM

## 2016-08-28 DIAGNOSIS — I10 Essential (primary) hypertension: Secondary | ICD-10-CM | POA: Diagnosis not present

## 2016-08-28 DIAGNOSIS — M8000XK Age-related osteoporosis with current pathological fracture, unspecified site, subsequent encounter for fracture with nonunion: Secondary | ICD-10-CM | POA: Diagnosis not present

## 2016-08-28 DIAGNOSIS — R799 Abnormal finding of blood chemistry, unspecified: Secondary | ICD-10-CM

## 2016-08-28 DIAGNOSIS — E7800 Pure hypercholesterolemia, unspecified: Secondary | ICD-10-CM

## 2016-08-28 DIAGNOSIS — R0989 Other specified symptoms and signs involving the circulatory and respiratory systems: Secondary | ICD-10-CM | POA: Diagnosis not present

## 2016-08-28 DIAGNOSIS — F331 Major depressive disorder, recurrent, moderate: Secondary | ICD-10-CM

## 2016-08-28 DIAGNOSIS — R7309 Other abnormal glucose: Secondary | ICD-10-CM

## 2016-08-28 DIAGNOSIS — K2100 Gastro-esophageal reflux disease with esophagitis, without bleeding: Secondary | ICD-10-CM

## 2016-08-28 DIAGNOSIS — J449 Chronic obstructive pulmonary disease, unspecified: Secondary | ICD-10-CM

## 2016-08-28 DIAGNOSIS — Z1239 Encounter for other screening for malignant neoplasm of breast: Secondary | ICD-10-CM

## 2016-08-28 MED ORDER — FLUTICASONE-SALMETEROL 250-50 MCG/DOSE IN AEPB: 1.0000 | INHALATION_SPRAY | Freq: Two times a day (BID) | RESPIRATORY_TRACT | 1 refills | Status: DC

## 2016-08-28 NOTE — Patient Instructions (Addendum)
Miralax for constipation Increase Vit D supplement by adding Vit D 1000IU daily Call Dr. Lorinda Creed to schedule f/u. He wanted to see you in 06/2016. Increase Advair to 250-50 twice daily. Call if no improvements.  Call [facility] to schedule mammogram We will call you to schedule Carotid duplex ([place])

## 2016-08-28 NOTE — Progress Notes (Signed)
Patient: [name] [name], Female    DOB: June 13, 1946, 71 y.o.   MRN: [id number] Visit Date: 08/28/2016  Today's Provider: Mar Daring, PA-C   Chief Complaint  Patient presents with  . Medicare Wellness   Subjective:    Annual wellness visit [name] [name] is a 71 y.o. female. She feels fairly well. She reports exercising "house chores" . She reports she is sleeping poorly. She sleeps normally about 5 hours with some awakening in between.  Last AWE: 08/08/15 Colon:02/01/14-Hyperplastic, Polyp. Tubular adenoma, diverticulosis, Recheck in 2019 Mammogram; 08/24/15 BI-RADS 2: Benign BMD: 08/24/15; Osteoporosis (T score -3.3) -----------------------------------------------------------   Review of Systems  Constitutional: Positive for diaphoresis and fatigue.       Irritability  HENT: Positive for congestion, ear pain, postnasal drip, rhinorrhea, sinus pressure, sneezing and tinnitus.   Eyes: Positive for photophobia, discharge, itching and visual disturbance.  Respiratory: Positive for apnea, cough, chest tightness, shortness of breath and wheezing.   Cardiovascular: Positive for chest pain, palpitations and leg swelling.  Gastrointestinal: Positive for abdominal distention, constipation, nausea and vomiting.  Endocrine: Positive for cold intolerance, polydipsia and polyuria.  Genitourinary: Positive for enuresis and urgency.  Musculoskeletal: Positive for arthralgias, back pain, gait problem and joint swelling.  Skin: Positive for rash.  Neurological: Positive for dizziness, facial asymmetry, light-headedness and headaches.  Hematological: Bruises/bleeds easily.  Psychiatric/Behavioral: Positive for agitation and sleep disturbance. The patient is nervous/anxious.   All of the positive review of systems are all chronic issues  Social History   Social History  . Marital status: Widowed    Spouse name: N/A  . Number of children: N/A  . Years of education: N/A    Occupational History  . Not on file.   Social History Main Topics  . Smoking status: Current Every Day Smoker    Packs/day: 0.50  . Smokeless tobacco: Never Used  . Alcohol use 9.0 oz/week    15 Glasses of wine per week     Comment: 2-3 glasses wine daily  . Drug use: No  . Sexual activity: Not on file   Other Topics Concern  . Not on file   Social History Narrative  . No narrative on file    Past Medical History:  Diagnosis Date  . Anxiety   . CAD (coronary artery disease) unk  . COPD (chronic obstructive pulmonary disease) ([place])   . Depression   . Hypercholesteremia unk  . Hypertension      Patient Active Problem List   Diagnosis Date Noted  . Pedal edema 03/13/2016  . Osteoporosis 09/07/2015  . Pleural effusion 02/03/2015  . Rib fracture 02/02/2015  . Body mass index (BMI) of 23.0-23.9 in adult 12/29/2014  . Abnormal finding of blood chemistry 12/29/2014  . Gastric ulcer 12/29/2014  . H/O transient cerebral ischemia 12/29/2014  . HLD (hyperlipidemia) 12/29/2014  . BP (high blood pressure) 12/29/2014  . Below normal amount of sodium in the blood 12/29/2014  . Awareness of heartbeats 12/29/2014  . Abnormal Pap smear of vagina 12/29/2014  . Plantar fasciitis 12/29/2014  . Esophageal reflux 12/29/2014  . 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 TI (tricuspid incompetence) 12/31/2013  . Chest pain 12/31/2013  . Chronic obstructive pulmonary disease ([place]) 12/31/2013  . Abnormal blood sugar 03/01/2009  . Clinical depression 02/15/2009  . Current tobacco use 06/26/2006  . Episodic paroxysmal anxiety disorder 10/06/2005    Past Surgical History:  Procedure Laterality Date  . ABDOMINAL HYSTERECTOMY  1982  Her family history includes Aneurysm in her father; Diabetes in her father, maternal grandmother, and paternal grandmother; Heart disease in her father; Leukemia in her mother.      Current Outpatient Prescriptions:  .  albuterol (PROVENTIL HFA;VENTOLIN  HFA) 108 (90 BASE) MCG/ACT inhaler, Inhale 2 puffs into the lungs every 4 (four) hours as needed for wheezing or shortness of breath., Disp: 1 Inhaler, Rfl: 1 .  ALPRAZolam (XANAX) 0.5 MG tablet, TAKE 1 TABLET BY MOUTH TWICE DAILY AS NEEDED., Disp: 60 tablet, Rfl: 5 .  aspirin 81 MG tablet, Take 81 mg by mouth daily., Disp: , Rfl:  .  buPROPion (WELLBUTRIN SR) 150 MG 12 hr tablet, Take 150 mg by mouth daily. , Disp: , Rfl:  .  citalopram (CELEXA) 20 MG tablet, TAKE 1 TABLET BY MOUTH ONCE A DAY, Disp: 90 tablet, Rfl: 1 .  Fluticasone-Salmeterol (ADVAIR) 100-50 MCG/DOSE AEPB, Inhale 1 puff into the lungs 2 (two) times daily., Disp: 60 each, Rfl: 6 .  furosemide (LASIX) 20 MG tablet, Take 1 tablet (20 mg total) by mouth every other day., Disp: 30 tablet, Rfl: 3 .  lisinopril (PRINIVIL,ZESTRIL) 40 MG tablet, Take 40 mg by mouth daily. , Disp: , Rfl:  .  metoprolol succinate (TOPROL-XL) 50 MG 24 hr tablet, TAKE 1 TABLET(50 MG) BY MOUTH DAILY. FOLLOW UP, Disp: 90 tablet, Rfl: 1 .  pantoprazole (PROTONIX) 40 MG tablet, TAKE 1 TABLET(40 MG) BY MOUTH DAILY, Disp: 30 tablet, Rfl: 1 .  simvastatin (ZOCOR) 10 MG tablet, TAKE 1 TABLET BY MOUTH ONCE A DAY., Disp: 90 tablet, Rfl: 3 .  traMADol (ULTRAM) 50 MG tablet, Take 1 tablet by mouth as needed., Disp: , Rfl:  .  naproxen sodium (ANAPROX) 220 MG tablet, Take 220 mg by mouth 2 (two) times daily with a meal., Disp: , Rfl:  .  nicotine (NICODERM CQ - DOSED IN MG/24 HOURS) 21 mg/24hr patch, Place 1 patch (21 mg total) onto the skin daily. Reported on 08/08/2015 (Patient not taking: Reported on 08/28/2016), Disp: 28 patch, Rfl: 0  Patient Care Team: Mar Daring, PA-C as PCP - General (Family Medicine)     Objective:   Vitals: BP 120/80 (BP Location: Right Arm, Patient Position: Sitting, Cuff Size: Normal)   Pulse 70   Temp 98 F (36.7 C) (Oral)   Resp 16   Ht 5\' 3"  (1.6 m)   Wt 127 lb (57.6 kg)   BMI 22.50 kg/m   Physical Exam  Constitutional:  She is oriented to person, place, and time. She appears well-developed and well-nourished. No distress.  HENT:  Head: Normocephalic and atraumatic.  Right Ear: Hearing, tympanic membrane, external ear and ear canal normal.  Left Ear: Hearing, tympanic membrane, external ear and ear canal normal.  Nose: Nose normal.  Mouth/Throat: Uvula is midline, oropharynx is clear and moist and mucous membranes are normal. No oropharyngeal exudate.  Eyes: Conjunctivae and EOM are normal. Pupils are equal, round, and reactive to light. Right eye exhibits no discharge. Left eye exhibits no discharge. No scleral icterus.  Neck: Normal range of motion. Neck supple. No JVD present. Carotid bruit is present (bilat). No tracheal deviation present. No thyromegaly present.  Cardiovascular: Normal rate, regular rhythm and intact distal pulses.  Exam reveals no gallop and no friction rub.   Murmur heard. Pulmonary/Chest: Effort normal and breath sounds normal. No respiratory distress. She has no wheezes. She has no rales. She exhibits no tenderness.  Abdominal: Soft. Bowel sounds are normal.  She exhibits no distension and no mass. There is no tenderness. There is no rebound and no guarding.  Musculoskeletal: Normal range of motion. She exhibits no edema or tenderness.  Lymphadenopathy:    She has no cervical adenopathy.  Neurological: She is alert and oriented to person, place, and time.  Skin: Skin is warm and dry. No rash noted. She is not diaphoretic.  Psychiatric: She has a normal mood and affect. Her behavior is normal. Judgment and thought content normal.  Vitals reviewed.   Activities of Daily Living In your present state of health, do you have any difficulty performing the following activities: 08/28/2016  Hearing? Y  Vision? Y  Difficulty concentrating or making decisions? Y  Walking or climbing stairs? Y  Dressing or bathing? N  Doing errands, shopping? Y  Some recent data might be hidden    Fall  Risk Assessment Fall Risk  08/28/2016 08/08/2015 12/30/2014  Falls in the past year? Yes Yes Yes  Number falls in past yr: 2 or more 1 2 or more  Injury with Fall? Yes Yes Yes  Risk for fall due to : - - History of fall(s)     Depression Screen PHQ 2/9 Scores 08/28/2016 08/08/2015  PHQ - 2 Score 5 4  PHQ- 9 Score 13 9    Cognitive Testing - 6-CIT  Correct? Score   What year is it? yes 0 0 or 4  What month is it? yes 0 0 or 3  Memorize:    Pia Mau,  43,  [place],      What time is it? (within 1 hour) yes 0 0 or 3  Count backwards from 20 yes 0 0, 2, or 4  Name the months of the year yes 0 0, 2, or 4  Repeat name & address above yes 0 0, 2, 4, 6, 8, or 10       TOTAL SCORE  0/28   Interpretation:  Normal  Normal (0-7) Abnormal (8-28)   Audit-C Alcohol Use Screening  Question Answer Points  How often do you have alcoholic drink? 2-3 times weekly 3  On days you do drink alcohol, how many drinks do you typically consume? 1-2 0  How oftey will you drink 6 or more in a total? 1 times monthly 1  Total Score:  4   A score of 3 or more in women, and 4 or more in men indicates increased risk for alcohol abuse, EXCEPT if all of the points are from question 1.     Assessment & Plan:     Annual Wellness Visit  Reviewed patient's Family Medical History Reviewed and updated list of patient's medical providers Assessment of cognitive impairment was done Assessed patient's functional ability Established a written schedule for health screening [place] Completed and Reviewed  Exercise Activities and Dietary recommendations Goals    . Exercise 150 minutes per week (moderate activity)       Immunization History  Administered Date(s) Administered  . Influenza, High Dose Seasonal PF 05/23/2016  . Influenza,inj,Quad PF,36+ Mos 08/08/2015  . Pneumococcal Conjugate-13 03/11/2014  . Pneumococcal Polysaccharide-23 06/26/2006, 05/23/2016  . Tdap  03/22/2011    Health Maintenance  Topic Date Due  . Hepatitis C Screening  [birth year]  . ZOSTAVAX  08/04/2006  . MAMMOGRAM  08/23/2017  . TETANUS/TDAP  03/21/2021  . COLONOSCOPY  02/02/2024  . INFLUENZA VACCINE  Completed  . DEXA SCAN  Completed  . PNA vac  Low Risk Adult  Completed     Discussed health benefits of physical activity, and encouraged her to engage in regular exercise appropriate for her age and condition.    1. Medicare annual wellness visit, subsequent Normal physical exam today.  2. Essential hypertension Stable. Continue metoprolol 50 mg. I will check labs as below and follow-up pending results. - CBC w/Diff/Platelet - Comprehensive Metabolic Panel (CMET)  3. Chronic obstructive pulmonary disease, unspecified COPD type ([place]) Stable. Patient does not use a daily inhaler. She does have her rescue albuterol inhaler for as needed use.  4. Gastroesophageal reflux disease with esophagitis Stable. Continue Protonix 40 mg daily.  5. 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 Patient did have recent right humerus fracture and has been followed by Dr. Mikle Bosworth. - Comprehensive Metabolic Panel (CMET)  6. Abnormal blood sugar History of this. Will check labs as below and f/u pending results. - Comprehensive Metabolic Panel (CMET)  7. Abnormal finding of blood chemistry Previous history of low calcium and low potassium. - Comprehensive Metabolic Panel (CMET)  8. Moderate episode of recurrent major depressive disorder (HCC) Stable. Patient is taking Wellbutrin and citalopram. Also has as needed Xanax.  9. Current tobacco use Patient does report she has tried to quit smoking unsuccessfully. At this time she does not wish to try to quit area and she has previously used nicotine patches. She reports that she continues to smoke now for her nerves.  10. Pure hypercholesterolemia Stable.  Continue simvastatin 10 mg. Will check labs as below and f/u pending results. - Comprehensive Metabolic Panel (CMET) - Lipid Profile  11. Alcohol abuse Patient does continue to drink a reported 1-2 drinks 3-4 times per week. She does have at least one time monthly will she would drink more than 6 drinks and one sitting.  12. Breast cancer screening There is no family history of breast cancer. She does perform regular self breast exams. Mammogram was ordered as below. Information for [facility] was given to patient so she may schedule her mammogram at her convenience. - MM Digital Screening; Future  13. Bilateral carotid bruits Patient has bilateral bruits noted. Possibly secondary to her murmur referring of but patient has not had any imaging of her carotids recently. I will order carotid duplex as below and follow-up pending results. - US Carotid Duplex Bilateral; Future  ------------------------------------------------------------------------------------------------------------    Mar Daring, PA-C  [facility]

## 2016-08-29 LAB — CBC WITH DIFFERENTIAL/PLATELET
BASOS ABS: 0.1 10*3/uL (ref 0.0–0.2)
Basos: 1 %
EOS (ABSOLUTE): 0.2 10*3/uL (ref 0.0–0.4)
EOS: 2 %
HEMATOCRIT: 37.6 % (ref 34.0–46.6)
Hemoglobin: 12.9 g/dL (ref 11.1–15.9)
IMMATURE GRANULOCYTES: 0 %
Immature Grans (Abs): 0 10*3/uL (ref 0.0–0.1)
Lymphocytes Absolute: 2.5 10*3/uL (ref 0.7–3.1)
Lymphs: 34 %
MCH: 33.9 pg — ABNORMAL HIGH (ref 26.6–33.0)
MCHC: 34.3 g/dL (ref 31.5–35.7)
MCV: 99 fL — ABNORMAL HIGH (ref 79–97)
MONOS ABS: 0.6 10*3/uL (ref 0.1–0.9)
Monocytes: 8 %
Neutrophils Absolute: 4 10*3/uL (ref 1.4–7.0)
Neutrophils: 55 %
PLATELETS: 272 10*3/uL (ref 150–379)
RBC: 3.81 x10E6/uL (ref 3.77–5.28)
RDW: 13.3 % (ref 12.3–15.4)
WBC: 7.3 10*3/uL (ref 3.4–10.8)

## 2016-08-29 LAB — LIPID PANEL
CHOL/HDL RATIO: 2.4 ratio (ref 0.0–4.4)
CHOLESTEROL TOTAL: 151 mg/dL (ref 100–199)
HDL: 62 mg/dL (ref >39)
LDL CALC: 68 mg/dL (ref 0–99)
TRIGLYCERIDES: 107 mg/dL (ref 0–149)
VLDL CHOLESTEROL CAL: 21 mg/dL (ref 5–40)

## 2016-08-29 LAB — COMPREHENSIVE METABOLIC PANEL
ALT: 10 IU/L (ref 0–32)
AST: 17 IU/L (ref 0–40)
Albumin/Globulin Ratio: 1.5 (ref 1.2–2.2)
Albumin: 4.5 g/dL (ref 3.5–4.8)
Alkaline Phosphatase: 93 IU/L (ref 39–117)
BUN/Creatinine Ratio: 14 (ref 12–28)
BUN: 12 mg/dL (ref 8–27)
Bilirubin Total: 0.3 mg/dL (ref 0.0–1.2)
CALCIUM: 9.5 mg/dL (ref 8.7–10.3)
CO2: 23 mmol/L (ref 18–29)
CREATININE: 0.86 mg/dL (ref 0.57–1.00)
Chloride: 89 mmol/L — ABNORMAL LOW (ref 96–106)
GFR calc Af Amer: 79 mL/min/{1.73_m2} (ref >59)
GFR, EST NON AFRICAN AMERICAN: 69 mL/min/{1.73_m2} (ref >59)
GLOBULIN, TOTAL: 3 g/dL (ref 1.5–4.5)
GLUCOSE: 107 mg/dL — AB (ref 65–99)
Potassium: 4 mmol/L (ref 3.5–5.2)
Sodium: 132 mmol/L — ABNORMAL LOW (ref 134–144)
Total Protein: 7.5 g/dL (ref 6.0–8.5)

## 2016-08-29 NOTE — Progress Notes (Signed)
Advised  ED 

## 2016-09-03 ENCOUNTER — Ambulatory Visit
Admission: RE | Admit: 2016-09-03 | Discharge: 2016-09-03 | Disposition: A | Discharge status: Home / Self Care | Payer: Medicare Other | Source: Ambulatory Visit | Attending: Physician Assistant | Admitting: Physician Assistant

## 2016-09-03 DIAGNOSIS — R0989 Other specified symptoms and signs involving the circulatory and respiratory systems: Secondary | ICD-10-CM | POA: Diagnosis present

## 2016-09-03 DIAGNOSIS — I6523 Occlusion and stenosis of bilateral carotid arteries: Secondary | ICD-10-CM | POA: Diagnosis not present

## 2016-09-03 DIAGNOSIS — Z1231 Encounter for screening mammogram for malignant neoplasm of breast: Secondary | ICD-10-CM | POA: Diagnosis not present

## 2016-09-04 ENCOUNTER — Telehealth: Payer: Self-pay

## 2016-09-04 NOTE — Telephone Encounter (Signed)
-----   Message from Mar Daring, [place] sent at 09/03/2016  8:11 PM EST ----- Carotid duplex fairly stable with plaque build up less than 50%. Ok to recheck in 1-2 years.

## 2016-09-04 NOTE — Telephone Encounter (Signed)
Patient advised as below. Patient verbalizes understanding and is in agreement with treatment plan.  

## 2016-09-12 ENCOUNTER — Encounter: Payer: Self-pay | Admitting: Physician Assistant

## 2016-10-24 ENCOUNTER — Other Ambulatory Visit: Payer: Self-pay | Admitting: Family Medicine

## 2016-10-24 DIAGNOSIS — F329 Major depressive disorder, single episode, unspecified: Secondary | ICD-10-CM

## 2016-10-24 DIAGNOSIS — F32A Depression, unspecified: Secondary | ICD-10-CM

## 2016-11-04 ENCOUNTER — Other Ambulatory Visit: Payer: Self-pay | Admitting: Physician Assistant

## 2016-11-04 DIAGNOSIS — I10 Essential (primary) hypertension: Secondary | ICD-10-CM

## 2016-11-18 ENCOUNTER — Ambulatory Visit: Payer: Medicare Other | Admitting: Physician Assistant

## 2016-11-27 ENCOUNTER — Ambulatory Visit: Payer: Medicare Other | Admitting: Physician Assistant

## 2016-11-27 ENCOUNTER — Other Ambulatory Visit: Payer: Self-pay | Admitting: Physician Assistant

## 2016-11-27 DIAGNOSIS — F329 Major depressive disorder, single episode, unspecified: Secondary | ICD-10-CM

## 2016-11-27 DIAGNOSIS — F32A Depression, unspecified: Secondary | ICD-10-CM

## 2016-11-27 NOTE — Telephone Encounter (Signed)
Called in.

## 2016-11-27 NOTE — Progress Notes (Deleted)
Patient: [name] [name] Female    DOB: 1945/08/03   71 y.o.   MRN: [id number] Visit Date: 11/27/2016  Today's Provider: Mar Daring, PA-C   No chief complaint on file.  Subjective:    HPI  Hypertension, follow-up:  BP Readings from Last 3 Encounters:  08/28/16 120/80  05/27/16 (!) 146/68  05/23/16 100/68    She was last seen for hypertension 3 months ago.  BP at that visit was 120/80. Management since that visit includes continue current medication. She reports {excellent/good/fair/poor:19665} compliance with treatment. She {ACTION; IS/IS BTD:[id number]} having side effects.  She is not exercising. She is adherent to low salt diet.   Outside blood pressures are ***. She is experiencing {Symptoms; cardiac:12860}.  Patient denies {Symptoms; cardiac:12860}.   Cardiovascular risk factors include advanced age (older than 49 for men, 54 for women), dyslipidemia, hypertension and smoking/ tobacco exposure.  Use of agents associated with hypertension: none.     Weight trend: {trend:16658} Wt Readings from Last 3 Encounters:  08/28/16 127 lb (57.6 kg)  05/27/16 124 lb (56.2 kg)  05/23/16 122 lb (55.3 kg)    Current diet: {diet habits:16563}  ------------------------------------------------------------------------  Lipid/Cholesterol, Follow-up:   Last seen for this3 months ago.  Management changes since that visit include continue current medication.Stable. . Last Lipid Panel:    Component Value Date/Time   CHOL 151 08/28/2016 1556   TRIG 107 08/28/2016 1556   HDL 62 08/28/2016 1556   CHOLHDL 2.4 08/28/2016 1556   LDLCALC 68 08/28/2016 1556    Risk factors for vascular disease include hypercholesterolemia and hypertension  She reports excellent compliance with treatment. She is not having side effects.  Current symptoms include {Symptoms; diabetes:14075} and have been {Desc;  course:15616}.  -------------------------------------------------------------------     No Known Allergies   Current Outpatient Prescriptions:  .  albuterol (PROVENTIL HFA;VENTOLIN HFA) 108 (90 BASE) MCG/ACT inhaler, Inhale 2 puffs into the lungs every 4 (four) hours as needed for wheezing or shortness of breath., Disp: 1 Inhaler, Rfl: 1 .  ALPRAZolam (XANAX) 0.5 MG tablet, TAKE 1 TABLET BY MOUTH TWICE DAILY AS NEEDED, Disp: 60 tablet, Rfl: 5 .  aspirin 81 MG tablet, Take 81 mg by mouth daily., Disp: , Rfl:  .  buPROPion (WELLBUTRIN SR) 150 MG 12 hr tablet, Take 150 mg by mouth daily. , Disp: , Rfl:  .  citalopram (CELEXA) 20 MG tablet, TAKE 1 TABLET BY MOUTH EVERY DAY, Disp: 90 tablet, Rfl: 1 .  Fluticasone-Salmeterol (ADVAIR) 250-50 MCG/DOSE AEPB, Inhale 1 puff into the lungs 2 (two) times daily., Disp: 14 each, Rfl: 1 .  furosemide (LASIX) 20 MG tablet, Take 1 tablet (20 mg total) by mouth every other day., Disp: 30 tablet, Rfl: 3 .  lisinopril (PRINIVIL,ZESTRIL) 40 MG tablet, Take 40 mg by mouth daily. , Disp: , Rfl:  .  metoprolol succinate (TOPROL-XL) 50 MG 24 hr tablet, TAKE 1 TABLET(50 MG) BY MOUTH DAILY. FOLLOW UP, Disp: 90 tablet, Rfl: 1 .  naproxen sodium (ANAPROX) 220 MG tablet, Take 220 mg by mouth 2 (two) times daily with a meal., Disp: , Rfl:  .  nicotine (NICODERM CQ - DOSED IN MG/24 HOURS) 21 mg/24hr patch, Place 1 patch (21 mg total) onto the skin daily. Reported on 08/08/2015 (Patient not taking: Reported on 08/28/2016), Disp: 28 patch, Rfl: 0 .  pantoprazole (PROTONIX) 40 MG tablet, TAKE 1 TABLET(40 MG) BY MOUTH DAILY, Disp: 30 tablet, Rfl: 1 .  simvastatin (ZOCOR) 10  MG tablet, TAKE 1 TABLET BY MOUTH ONCE A DAY., Disp: 90 tablet, Rfl: 3 .  traMADol (ULTRAM) 50 MG tablet, Take 1 tablet by mouth as needed., Disp: , Rfl:   Review of Systems  Social History  Substance Use Topics  . Smoking status: Current Every Day Smoker    Packs/day: 0.50  . Smokeless tobacco: Never  Used  . Alcohol use 9.0 oz/week    15 Glasses of wine per week     Comment: 2-3 glasses wine daily   Objective:   There were no vitals taken for this visit.   Physical Exam      Assessment & Plan:           Mar Daring, PA-C  [facility]

## 2016-12-02 ENCOUNTER — Encounter: Payer: Self-pay | Admitting: Physician Assistant

## 2016-12-02 ENCOUNTER — Ambulatory Visit (INDEPENDENT_AMBULATORY_CARE_PROVIDER_SITE_OTHER): Payer: Medicare Other | Admitting: Physician Assistant

## 2016-12-02 VITALS — BP 120/80 | HR 70 | Temp 98.2°F | Resp 16 | Wt 126.8 lb

## 2016-12-02 DIAGNOSIS — I340 Nonrheumatic mitral (valve) insufficiency: Secondary | ICD-10-CM | POA: Diagnosis not present

## 2016-12-02 DIAGNOSIS — L409 Psoriasis, unspecified: Secondary | ICD-10-CM | POA: Diagnosis not present

## 2016-12-02 DIAGNOSIS — I071 Rheumatic tricuspid insufficiency: Secondary | ICD-10-CM

## 2016-12-02 DIAGNOSIS — R0789 Other chest pain: Secondary | ICD-10-CM | POA: Diagnosis not present

## 2016-12-02 MED ORDER — TRIAMCINOLONE ACETONIDE 0.1 % EX CREA: 1.0000 "application " | TOPICAL_CREAM | Freq: Two times a day (BID) | CUTANEOUS | 0 refills | Status: DC

## 2016-12-02 NOTE — Progress Notes (Signed)
Patient: [name] [name] Female    DOB: 1946/06/02   71 y.o.   MRN: [id number] Visit Date: 12/02/2016  Today's Provider: Mar Daring, PA-C   Chief Complaint  Patient presents with  . Follow-up    Hypertension and Cholesterol  . Rash   Subjective:    Rash  This is a new problem. The current episode started more than 1 month ago (a month ago). The problem has been gradually improving since onset. The affected locations include the right shoulder, chest and back (hairline down to her ears and right breast). The rash is characterized by dryness, itchiness and burning. It is unknown if there was an exposure to a precipitant. Associated symptoms include fatigue. Pertinent negatives include no congestion, cough, facial edema, rhinorrhea, shortness of breath or sore throat. Treatments tried: peroxide. The treatment provided mild relief.    Hypertension, follow-up:  BP Readings from Last 3 Encounters:  12/02/16 120/80  08/28/16 120/80  05/27/16 (!) 146/68    She was last seen for hypertension 3 months ago.  BP at that visit was 120/80 stable. Management since that visit includes Continue Metoprolol 50 mg. She reports excellent compliance with treatment. She is not having side effects.  She is not exercising. She is adherent to low salt diet. On last labs her sodium was slightly low and was advise to increase salt intake in diet slightly.  Outside blood pressures are 125's/80's. Sometimes 157/80's. She is experiencing lower extremity edema. (ankles) and fatigue.She also reports that in the last 2 weeks she feels like with her valve she gets a sharp quick pain repeatedly) but it goes away again. She last saw Dr.Paraschos in September/October. Patient denies chest pain, chest pressure/discomfort, exertional chest pressure/discomfort, fatigue, irregular heart beat, near-syncope and palpitations.   Cardiovascular risk factors include advanced age (older than 12 for men, 34 for  women), dyslipidemia, hypertension and smoking/ tobacco exposure.      Weight trend: stable Wt Readings from Last 3 Encounters:  12/02/16 126 lb 12.8 oz (57.5 kg)  08/28/16 127 lb (57.6 kg)  05/27/16 124 lb (56.2 kg)    Current diet: in general, an "unhealthy" diet  ------------------------------------------------------------------------  Lipid/Cholesterol, Follow-up:   Last seen for this3 months ago.  Management changes since that visit include none. . Last Lipid Panel:    Component Value Date/Time   CHOL 151 08/28/2016 1556   TRIG 107 08/28/2016 1556   HDL 62 08/28/2016 1556   CHOLHDL 2.4 08/28/2016 1556   LDLCALC 68 08/28/2016 1556    Risk factors for vascular disease include hypercholesterolemia, hypertension and smoking  She reports excellent compliance with treatment. She is not having side effects.  Current symptoms include none and have been stable.     No Known Allergies   Current Outpatient Prescriptions:  .  albuterol (PROVENTIL HFA;VENTOLIN HFA) 108 (90 BASE) MCG/ACT inhaler, Inhale 2 puffs into the lungs every 4 (four) hours as needed for wheezing or shortness of breath., Disp: 1 Inhaler, Rfl: 1 .  ALPRAZolam (XANAX) 0.5 MG tablet, TAKE 1 TABLET BY MOUTH TWICE DAILY AS NEEDED, Disp: 60 tablet, Rfl: 5 .  aspirin 81 MG tablet, Take 81 mg by mouth daily., Disp: , Rfl:  .  buPROPion (WELLBUTRIN SR) 150 MG 12 hr tablet, Take 150 mg by mouth daily. , Disp: , Rfl:  .  citalopram (CELEXA) 20 MG tablet, TAKE 1 TABLET BY MOUTH EVERY DAY, Disp: 90 tablet, Rfl: 1 .  furosemide (LASIX) 20  MG tablet, Take 1 tablet (20 mg total) by mouth every other day., Disp: 30 tablet, Rfl: 3 .  lisinopril (PRINIVIL,ZESTRIL) 40 MG tablet, Take 40 mg by mouth daily. , Disp: , Rfl:  .  metoprolol succinate (TOPROL-XL) 50 MG 24 hr tablet, TAKE 1 TABLET(50 MG) BY MOUTH DAILY. FOLLOW UP, Disp: 90 tablet, Rfl: 1 .  pantoprazole (PROTONIX) 40 MG tablet, TAKE 1 TABLET(40 MG) BY MOUTH DAILY,  Disp: 30 tablet, Rfl: 1 .  simvastatin (ZOCOR) 10 MG tablet, TAKE 1 TABLET BY MOUTH ONCE A DAY., Disp: 90 tablet, Rfl: 3 .  Fluticasone-Salmeterol (ADVAIR) 250-50 MCG/DOSE AEPB, Inhale 1 puff into the lungs 2 (two) times daily. (Patient not taking: Reported on 12/02/2016), Disp: 14 each, Rfl: 1 .  naproxen sodium (ANAPROX) 220 MG tablet, Take 220 mg by mouth 2 (two) times daily with a meal., Disp: , Rfl:  .  nicotine (NICODERM CQ - DOSED IN MG/24 HOURS) 21 mg/24hr patch, Place 1 patch (21 mg total) onto the skin daily. Reported on 08/08/2015 (Patient not taking: Reported on 08/28/2016), Disp: 28 patch, Rfl: 0 .  traMADol (ULTRAM) 50 MG tablet, Take 1 tablet by mouth as needed., Disp: , Rfl:   Review of Systems  Constitutional: Positive for fatigue.  HENT: Negative for congestion, rhinorrhea and sore throat.   Respiratory: Negative for cough and shortness of breath.   Cardiovascular: Positive for leg swelling (ankles). Negative for chest pain and palpitations.  Skin: Positive for rash.  Neurological: Positive for headaches. Negative for dizziness and light-headedness.  Psychiatric/Behavioral: Positive for sleep disturbance.    Social History  Substance Use Topics  . Smoking status: Current Every Day Smoker    Packs/day: 0.50  . Smokeless tobacco: Never Used  . Alcohol use 9.0 oz/week    15 Glasses of wine per week     Comment: 2-3 glasses wine daily   Objective:   BP 120/80 (BP Location: Right Arm, Patient Position: Sitting, Cuff Size: Normal)   Pulse 70   Temp 98.2 F (36.8 C) (Oral)   Resp 16   Wt 126 lb 12.8 oz (57.5 kg)   SpO2 95%   BMI 22.46 kg/m    Physical Exam  Constitutional: She appears well-developed and well-nourished. No distress.  Neck: Normal range of motion. Neck supple. No JVD present. No tracheal deviation present. No thyromegaly present.  Cardiovascular: Normal rate and regular rhythm.  Exam reveals no gallop and no friction rub.   Murmur  heard. Pulmonary/Chest: Effort normal. No respiratory distress. She has no decreased breath sounds. She has wheezes (mild wheezes heard diffusely throughout). She has no rhonchi. She has no rales.  Musculoskeletal: She exhibits no edema.  Lymphadenopathy:    She has no cervical adenopathy.  Skin: Rash noted. She is not diaphoretic.  Diffuse scaly rash noted on back and around hairline  Vitals reviewed.       Assessment & Plan:     1. Psoriasis New onset rash that seems c/w psorasis. Will give triamcinolone cream as below. She is to call if symptoms worsen.  - triamcinolone cream (KENALOG) 0.1 %; Apply 1 application topically 2 (two) times daily.  Dispense: 80 g; Refill: 0  2. Other chest pain Patient is still complaining of off and on pain that comes mostly at night while she is relaxing in her recliner in the evenings. It does not happen every night. She has complained of this to Dr. Dierdre Highman when she was last seen by him in 03/2016. She  was to have followed up in 3 months but failed to schedule. I will place referral as below back to Dr. Saralyn Pilar to get her a rescheduled appt as she states she would not call to schedule herself.  - Ambulatory referral to Cardiology  3. Tricuspid valve insufficiency, unspecified etiology See above medical treatment plan. - Ambulatory referral to Cardiology  4. Non-rheumatic mitral regurgitation See above medical treatment plan. - Ambulatory referral to Cardiology       Mar Daring, PA-C  [facility]

## 2016-12-02 NOTE — Patient Instructions (Signed)
Psoriasis Psoriasis is a long-term (chronic) skin condition. It causes raised, red patches (plaques) on your skin that look silvery. The red patches may show up anywhere on your body. They can be any size or shape. Psoriasis can come and go. It can range from mild to very bad. It cannot be passed from one person to another (not contagious). There is no cure for this condition, but it can be helped with treatment. Follow these instructions at home: Skin Care   Apply moisturizers to your skin as needed. Only use those that your doctor has said are okay.  Apply cool compresses to the affected areas.  Do not scratch your skin. Lifestyle    Do not use tobacco products. This includes cigarettes, chewing tobacco, and e-cigarettes. If you need help quitting, ask your doctor.  Drink little or no alcohol.  Try to lower your stress. Meditation or yoga may help.  Get sun as told by your doctor. Do not get sunburned.  Think about joining a psoriasis support group. Medicines   Take or use over-the-counter and prescription medicines only as told by your doctor.  If you were prescribed an antibiotic, take or use it as told by your doctor. Do not stop taking the antibiotic even if your condition starts to get better. General instructions   Keep a journal. Use this to help track what triggers an outbreak. Try to avoid any triggers.  See a counselor or social worker if you feel very sad, upset, or hopeless about your condition and these feelings affect your work or relationships.  Keep all follow-up visits as told by your doctor. This is important. Contact a doctor if:  Your pain gets worse.  You have more redness or warmth in the affected areas.  You have new pain or stiffness in your joints.  Your pain or stiffness in your joints gets worse.  Your nails start to break easily.  Your nails pull away from the nail bed easily.  You have a fever.  You feel very sad (depressed). This  information is not intended to replace advice given to you by your health care provider. Make sure you discuss any questions you have with your health care provider. Document Released: 08/22/2004 Document Revised: 12/21/2015 Document Reviewed: 11/30/2014 Elsevier Interactive Patient Education  2017 [employer].

## 2016-12-04 DIAGNOSIS — R002 Palpitations: Secondary | ICD-10-CM | POA: Diagnosis not present

## 2016-12-04 DIAGNOSIS — I340 Nonrheumatic mitral (valve) insufficiency: Secondary | ICD-10-CM | POA: Diagnosis not present

## 2016-12-04 DIAGNOSIS — I361 Nonrheumatic tricuspid (valve) insufficiency: Secondary | ICD-10-CM | POA: Diagnosis not present

## 2016-12-04 DIAGNOSIS — R0789 Other chest pain: Secondary | ICD-10-CM | POA: Diagnosis not present

## 2016-12-10 DIAGNOSIS — I491 Atrial premature depolarization: Secondary | ICD-10-CM | POA: Diagnosis not present

## 2016-12-18 DIAGNOSIS — I10 Essential (primary) hypertension: Secondary | ICD-10-CM | POA: Diagnosis not present

## 2016-12-18 DIAGNOSIS — I480 Paroxysmal atrial fibrillation: Secondary | ICD-10-CM | POA: Diagnosis not present

## 2016-12-18 DIAGNOSIS — R0789 Other chest pain: Secondary | ICD-10-CM | POA: Diagnosis not present

## 2016-12-18 DIAGNOSIS — J449 Chronic obstructive pulmonary disease, unspecified: Secondary | ICD-10-CM | POA: Diagnosis not present

## 2016-12-18 DIAGNOSIS — I340 Nonrheumatic mitral (valve) insufficiency: Secondary | ICD-10-CM | POA: Diagnosis not present

## 2017-01-13 ENCOUNTER — Other Ambulatory Visit: Payer: Self-pay

## 2017-01-13 ENCOUNTER — Other Ambulatory Visit: Payer: Self-pay | Admitting: Physician Assistant

## 2017-01-13 DIAGNOSIS — J441 Chronic obstructive pulmonary disease with (acute) exacerbation: Secondary | ICD-10-CM

## 2017-01-13 MED ORDER — ALBUTEROL SULFATE HFA 108 (90 BASE) MCG/ACT IN AERS: 2.0000 | INHALATION_SPRAY | RESPIRATORY_TRACT | 1 refills | Status: DC | PRN

## 2017-01-13 NOTE — Telephone Encounter (Signed)
done

## 2017-01-13 NOTE — Telephone Encounter (Signed)
Pt contacted office for refill request on the following medications:  albuterol (PROVENTIL HFA;VENTOLIN HFA) 108 (90 BASE) MCG/ACT inhaler.  [facility] [address].  [phone]

## 2017-01-13 NOTE — Telephone Encounter (Signed)
Ok to refill? Please advise. Thanks!  

## 2017-01-22 DIAGNOSIS — I10 Essential (primary) hypertension: Secondary | ICD-10-CM | POA: Diagnosis not present

## 2017-01-22 DIAGNOSIS — R0609 Other forms of dyspnea: Secondary | ICD-10-CM | POA: Diagnosis not present

## 2017-01-22 DIAGNOSIS — I340 Nonrheumatic mitral (valve) insufficiency: Secondary | ICD-10-CM | POA: Diagnosis not present

## 2017-01-22 DIAGNOSIS — J449 Chronic obstructive pulmonary disease, unspecified: Secondary | ICD-10-CM | POA: Diagnosis not present

## 2017-02-03 ENCOUNTER — Ambulatory Visit (INDEPENDENT_AMBULATORY_CARE_PROVIDER_SITE_OTHER): Payer: Medicare Other | Admitting: Physician Assistant

## 2017-02-03 ENCOUNTER — Encounter: Payer: Self-pay | Admitting: Physician Assistant

## 2017-02-03 VITALS — BP 100/62 | HR 72 | Temp 98.5°F | Resp 16 | Wt 128.0 lb

## 2017-02-03 DIAGNOSIS — M7022 Olecranon bursitis, left elbow: Secondary | ICD-10-CM

## 2017-02-03 DIAGNOSIS — J441 Chronic obstructive pulmonary disease with (acute) exacerbation: Secondary | ICD-10-CM

## 2017-02-03 MED ORDER — PREDNISONE 10 MG PO TABS: ORAL_TABLET | ORAL | 0 refills | Status: DC

## 2017-02-03 MED ORDER — ALBUTEROL SULFATE HFA 108 (90 BASE) MCG/ACT IN AERS: 2.0000 | INHALATION_SPRAY | RESPIRATORY_TRACT | 1 refills | Status: DC | PRN

## 2017-02-03 NOTE — Progress Notes (Signed)
Patient: [name] [name] Female    DOB: Dec 31, 1945   71 y.o.   MRN: [id number] Visit Date: 02/03/2017  Today's Provider: Mar Daring, PA-C   Chief Complaint  Patient presents with  . Joint Swelling   Subjective:    HPI Patient here today C/O left elbow pain and swelling. Patient reports swelling has been present since May, pt reports swelling has improved. Patient denies any injuries or falls. Patient reports taking ibuprofen and using ice to relief swelling.   Patient C/O worsening cough times two weeks. Patient reports having to use her albuterol inhaler three times daily. Patient reports wheezing and denies fever. Patient denies having to use albuterol inhaler during the night. Patient requesting refill on albuterol. No Known Allergies   Current Outpatient Prescriptions:  .  albuterol (PROVENTIL HFA;VENTOLIN HFA) 108 (90 Base) MCG/ACT inhaler, Inhale 2 puffs into the lungs every 4 (four) hours as needed for wheezing or shortness of breath., Disp: 1 Inhaler, Rfl: 1 .  ALPRAZolam (XANAX) 0.5 MG tablet, TAKE 1 TABLET BY MOUTH TWICE DAILY AS NEEDED, Disp: 60 tablet, Rfl: 5 .  aspirin 81 MG tablet, Take 81 mg by mouth daily., Disp: , Rfl:  .  buPROPion (WELLBUTRIN SR) 150 MG 12 hr tablet, Take 150 mg by mouth daily. , Disp: , Rfl:  .  citalopram (CELEXA) 20 MG tablet, TAKE 1 TABLET BY MOUTH EVERY DAY, Disp: 90 tablet, Rfl: 1 .  Fluticasone-Salmeterol (ADVAIR) 250-50 MCG/DOSE AEPB, Inhale 1 puff into the lungs 2 (two) times daily., Disp: 14 each, Rfl: 1 .  furosemide (LASIX) 20 MG tablet, Take 1 tablet (20 mg total) by mouth every other day., Disp: 30 tablet, Rfl: 3 .  lisinopril (PRINIVIL,ZESTRIL) 40 MG tablet, Take 40 mg by mouth daily. , Disp: , Rfl:  .  metoprolol succinate (TOPROL-XL) 50 MG 24 hr tablet, TAKE 1 TABLET(50 MG) BY MOUTH DAILY. FOLLOW UP, Disp: 90 tablet, Rfl: 1 .  naproxen sodium (ANAPROX) 220 MG tablet, Take 220 mg by mouth 2 (two) times daily with a meal.,  Disp: , Rfl:  .  pantoprazole (PROTONIX) 40 MG tablet, TAKE 1 TABLET(40 MG) BY MOUTH DAILY, Disp: 30 tablet, Rfl: 1 .  simvastatin (ZOCOR) 10 MG tablet, TAKE 1 TABLET BY MOUTH ONCE A DAY., Disp: 90 tablet, Rfl: 3 .  traMADol (ULTRAM) 50 MG tablet, Take 1 tablet by mouth as needed., Disp: , Rfl:  .  triamcinolone cream (KENALOG) 0.1 %, Apply 1 application topically 2 (two) times daily., Disp: 80 g, Rfl: 0 .  nicotine (NICODERM CQ - DOSED IN MG/24 HOURS) 21 mg/24hr patch, Place 1 patch (21 mg total) onto the skin daily. Reported on 08/08/2015 (Patient not taking: Reported on 08/28/2016), Disp: 28 patch, Rfl: 0  Review of Systems  Constitutional: Negative.   HENT: Positive for congestion.   Respiratory: Positive for cough and wheezing. Negative for chest tightness and shortness of breath.   Cardiovascular: Negative.   Gastrointestinal: Diarrhea: left elbow.  Musculoskeletal: Positive for joint swelling (left elbow). Negative for arthralgias.  Neurological: Negative for weakness and numbness.    Social History  Substance Use Topics  . Smoking status: Current Every Day Smoker    Packs/day: 0.50  . Smokeless tobacco: Never Used  . Alcohol use 9.0 oz/week    15 Glasses of wine per week     Comment: 2-3 glasses wine daily   Objective:   BP 100/62 (BP Location: Right Arm, Patient Position: Sitting, Cuff  Size: Normal)   Pulse 72   Temp 98.5 F (36.9 C)   Resp 16   Wt 128 lb (58.1 kg)   SpO2 94%   BMI 22.67 kg/m  Vitals:   02/03/17 1610  BP: 100/62  Pulse: 72  Resp: 16  Temp: 98.5 F (36.9 C)  SpO2: 94%  Weight: 128 lb (58.1 kg)     Physical Exam  Constitutional: She appears well-developed and well-nourished. No distress.  HENT:  Head: Normocephalic and atraumatic.  Right Ear: Hearing, tympanic membrane, external ear and ear canal normal.  Left Ear: Hearing, tympanic membrane, external ear and ear canal normal.  Nose: Nose normal.  Mouth/Throat: Uvula is midline, oropharynx  is clear and moist and mucous membranes are normal. No oropharyngeal exudate.  Eyes: Conjunctivae are normal. Pupils are equal, round, and reactive to light. Right eye exhibits no discharge. Left eye exhibits no discharge. No scleral icterus.  Neck: Normal range of motion. Neck supple. No tracheal deviation present. No thyromegaly present.  Cardiovascular: Normal rate, regular rhythm and normal heart sounds.  Exam reveals no gallop and no friction rub.   No murmur heard. Pulmonary/Chest: Effort normal. No stridor. No respiratory distress. She has wheezes (faint expiratory throughout). She has no rales.  Musculoskeletal:       Left elbow: She exhibits swelling (olecranon bursitis ). She exhibits normal range of motion and no effusion. No tenderness found.  Lymphadenopathy:    She has no cervical adenopathy.  Skin: Skin is warm and dry. She is not diaphoretic.  Vitals reviewed.     Assessment & Plan:     1. Olecranon bursitis of left elbow Improving. Not red, warm, or tender to touch. Swelling remains over olecranon process of left elbow. Since swelling has been improving with conservative management with ice and tylenol, I will not aspirate currently. I will treat with prednisone as below. She is to call if symptoms do not improve and will consider aspiration.  - predniSONE (DELTASONE) 10 MG tablet; Take 6 tabs PO on day 1&2, 5 tabs PO on day 3&4, 4 tabs PO on day 5&6, 3 tabs PO on day 7&8, 2 tabs PO on day 9&10, 1 tab PO on day 11&12.  Dispense: 42 tablet; Refill: 0  2. Chronic obstructive pulmonary disease with acute exacerbation (HCC) Currently flared. Will refill albuterol as below. Oral prednisone taper being used for bursitis above may help symptoms as well. Continue Advair 250-50. Call if no improvements. - albuterol (PROVENTIL HFA;VENTOLIN HFA) 108 (90 Base) MCG/ACT inhaler; Inhale 2 puffs into the lungs every 4 (four) hours as needed for wheezing or shortness of breath.  Dispense: 1  Inhaler; Refill: [place], PA-C  [facility]

## 2017-02-03 NOTE — Patient Instructions (Signed)
Bursitis Bursitis is when the fluid-filled sac (bursa) that covers and protects a joint is swollen (inflamed). Bursitis is most common near joints, especially the knees, elbows, hips, and shoulders. Follow these instructions at home:  Take medicines only as told by your doctor.  If you were prescribed an antibiotic medicine, finish it all even if you start to feel better.  Rest the affected area as told by your doctor. ? Keep the area raised up. ? Avoid doing things that make the pain worse.  Apply ice to the injured area: ? Place ice in a plastic bag. ? Place a towel between your skin and the bag. ? Leave the ice on for 20 minutes, 2-3 times a day.  Use splints, braces, pads, or walking aids as told by your doctor.  Keep all follow-up visits as told by your doctor. This is important. Contact a doctor if:  You have more pain with home care.  You have a fever.  You have chills. This information is not intended to replace advice given to you by your health care provider. Make sure you discuss any questions you have with your health care provider. Document Released: 01/02/2010 Document Revised: 12/21/2015 Document Reviewed: 10/04/2013 Elsevier Interactive Patient Education  2018 [employer]  

## 2017-03-17 ENCOUNTER — Other Ambulatory Visit: Payer: Self-pay | Admitting: Physician Assistant

## 2017-03-17 DIAGNOSIS — R600 Localized edema: Secondary | ICD-10-CM

## 2017-03-24 ENCOUNTER — Telehealth: Payer: Self-pay | Admitting: Physician Assistant

## 2017-03-24 MED ORDER — SIMVASTATIN 10 MG PO TABS: 10.0000 mg | ORAL_TABLET | Freq: Every day | ORAL | 3 refills | Status: DC

## 2017-03-24 NOTE — Telephone Encounter (Signed)
refilled 

## 2017-03-24 NOTE — Telephone Encounter (Signed)
[facility] faxed a request on the following medication. Thanks CC  simvastatin (ZOCOR) 10 MG tablet  >Take 1 tablet by mouth every day.

## 2017-05-11 ENCOUNTER — Other Ambulatory Visit: Payer: Self-pay | Admitting: Physician Assistant

## 2017-05-11 DIAGNOSIS — I10 Essential (primary) hypertension: Secondary | ICD-10-CM

## 2017-05-20 ENCOUNTER — Encounter: Payer: Self-pay | Admitting: Physician Assistant

## 2017-05-20 ENCOUNTER — Ambulatory Visit (INDEPENDENT_AMBULATORY_CARE_PROVIDER_SITE_OTHER): Payer: Medicare Other | Admitting: Physician Assistant

## 2017-05-20 VITALS — BP 116/66 | HR 64 | Temp 97.8°F | Resp 20 | Wt 125.0 lb

## 2017-05-20 DIAGNOSIS — I509 Heart failure, unspecified: Secondary | ICD-10-CM

## 2017-05-20 DIAGNOSIS — F320 Major depressive disorder, single episode, mild: Secondary | ICD-10-CM

## 2017-05-20 DIAGNOSIS — Z23 Encounter for immunization: Secondary | ICD-10-CM | POA: Diagnosis not present

## 2017-05-20 DIAGNOSIS — E876 Hypokalemia: Secondary | ICD-10-CM

## 2017-05-20 DIAGNOSIS — J449 Chronic obstructive pulmonary disease, unspecified: Secondary | ICD-10-CM

## 2017-05-20 DIAGNOSIS — R0601 Orthopnea: Secondary | ICD-10-CM

## 2017-05-20 DIAGNOSIS — R7309 Other abnormal glucose: Secondary | ICD-10-CM | POA: Diagnosis not present

## 2017-05-20 DIAGNOSIS — R0602 Shortness of breath: Secondary | ICD-10-CM | POA: Diagnosis not present

## 2017-05-20 DIAGNOSIS — R600 Localized edema: Secondary | ICD-10-CM | POA: Diagnosis not present

## 2017-05-20 LAB — POCT GLYCOSYLATED HEMOGLOBIN (HGB A1C)
Est. average glucose Bld gHb Est-mCnc: 105
Hemoglobin A1C: 5.3

## 2017-05-20 MED ORDER — MIRTAZAPINE 7.5 MG PO TABS: 7.5000 mg | ORAL_TABLET | Freq: Every day | ORAL | 0 refills | Status: DC

## 2017-05-20 NOTE — Progress Notes (Signed)
Patient: [name] [name] Female    DOB: [birth year]   71 y.o.   MRN: [id number] Visit Date: 05/20/2017  Today's Provider: Mar Daring, PA-C   Chief Complaint  Patient presents with  . Hyperglycemia  . Hypertension  . Depression   Subjective:    HPI  Prediabetes, Follow-up:   Lab Results  Component Value Date   HGBA1C 5.3 05/20/2017   HGBA1C 5.6 08/08/2015   HGBA1C 5.6 08/05/2014   GLUCOSE 107 (H) 08/28/2016   GLUCOSE 91 05/23/2016   GLUCOSE 86 03/06/2016    Last seen for for this9 months ago.  Management since that visit includes checking labs. Current symptoms include none and have been stable.  Weight trend: stable Prior visit with dietician: no Current diet: in general, a "healthy" diet   Current exercise: housecleaning  Pertinent Labs:    Component Value Date/Time   CHOL 151 08/28/2016 1556   TRIG 107 08/28/2016 1556   CHOLHDL 2.4 08/28/2016 1556   CREATININE 0.86 08/28/2016 1556   CREATININE 0.82 11/04/2013 0908    Wt Readings from Last 3 Encounters:  05/20/17 125 lb (56.7 kg)  02/03/17 128 lb (58.1 kg)  12/02/16 126 lb 12.8 oz (57.5 kg)    Hypertension, follow-up:  BP Readings from Last 3 Encounters:  05/20/17 116/66  02/03/17 100/62  12/02/16 120/80    She was last seen for hypertension 9 months ago.  BP at that visit was 120/80. Management changes since that visit include no changes. She reports excellent compliance with treatment. She is not having side effects.  She is not exercising. She is adherent to low salt diet.   Outside blood pressures are stable. She is experiencing lower extremity edema.  Patient denies chest pain.   Cardiovascular risk factors include advanced age (older than 12 for men, 34 for women), hypertension and smoking/ tobacco exposure.  Use of agents associated with hypertension: none.     Weight trend: stable Wt Readings from Last 3 Encounters:  05/20/17 125 lb (56.7 kg)  02/03/17 128 lb (58.1  kg)  12/02/16 126 lb 12.8 oz (57.5 kg)     Depression, Follow-up  She  was last seen for this 9 months ago. Changes made at last visit include no changes.   She reports excellent compliance with treatment. She is not having side effects.   She reports excellent tolerance of treatment. Current symptoms include: difficulty concentrating and fatigue She feels she is Unchanged since last visit.  -----------------------------------------------------------------------    No Known Allergies   Current Outpatient Prescriptions:  .  albuterol (PROVENTIL HFA;VENTOLIN HFA) 108 (90 Base) MCG/ACT inhaler, Inhale 2 puffs into the lungs every 4 (four) hours as needed for wheezing or shortness of breath., Disp: 1 Inhaler, Rfl: 1 .  ALPRAZolam (XANAX) 0.5 MG tablet, TAKE 1 TABLET BY MOUTH TWICE DAILY AS NEEDED, Disp: 60 tablet, Rfl: 5 .  aspirin 81 MG tablet, Take 81 mg by mouth daily., Disp: , Rfl:  .  buPROPion (WELLBUTRIN SR) 150 MG 12 hr tablet, Take 150 mg by mouth daily. , Disp: , Rfl:  .  citalopram (CELEXA) 20 MG tablet, TAKE 1 TABLET BY MOUTH EVERY DAY, Disp: 90 tablet, Rfl: 1 .  Fluticasone-Salmeterol (ADVAIR) 250-50 MCG/DOSE AEPB, Inhale 1 puff into the lungs 2 (two) times daily., Disp: 14 each, Rfl: 1 .  furosemide (LASIX) 20 MG tablet, TAKE 1 TABLET(20 MG) BY MOUTH DAILY, Disp: 30 tablet, Rfl: 5 .  lisinopril (PRINIVIL,ZESTRIL) 40 MG  tablet, Take 40 mg by mouth daily. , Disp: , Rfl:  .  metoprolol succinate (TOPROL-XL) 50 MG 24 hr tablet, TAKE 1 TABLET(50 MG) BY MOUTH DAILY. FOLLOW UP, Disp: 90 tablet, Rfl: 1 .  nicotine (NICODERM CQ - DOSED IN MG/24 HOURS) 21 mg/24hr patch, Place 1 patch (21 mg total) onto the skin daily. Reported on 08/08/2015, Disp: 28 patch, Rfl: 0 .  pantoprazole (PROTONIX) 40 MG tablet, TAKE 1 TABLET(40 MG) BY MOUTH DAILY, Disp: 30 tablet, Rfl: 1 .  simvastatin (ZOCOR) 10 MG tablet, Take 1 tablet (10 mg total) by mouth daily., Disp: 90 tablet, Rfl: 3 .   triamcinolone cream (KENALOG) 0.1 %, Apply 1 application topically 2 (two) times daily., Disp: 80 g, Rfl: 0  Review of Systems  Constitutional: Positive for activity change and appetite change.  Respiratory: Positive for cough and shortness of breath.   Cardiovascular: Positive for leg swelling.  Gastrointestinal: Negative.   Neurological: Negative.     Social History  Substance Use Topics  . Smoking status: Current Every Day Smoker    Packs/day: 0.50    Years: 50.00  . Smokeless tobacco: Never Used  . Alcohol use 4.2 oz/week    7 Glasses of wine per week     Comment: 1-2 glasses wine daily   Objective:   BP 116/66 (BP Location: Left Arm, Patient Position: Sitting, Cuff Size: Normal)   Pulse 64   Temp 97.8 F (36.6 C) (Oral)   Resp 20   Wt 125 lb (56.7 kg)   SpO2 96%   BMI 22.14 kg/m  Vitals:   05/20/17 0923  BP: 116/66  Pulse: 64  Resp: 20  Temp: 97.8 F (36.6 C)  TempSrc: Oral  SpO2: 96%  Weight: 125 lb (56.7 kg)     Physical Exam  Constitutional: She appears well-developed and well-nourished. No distress.  Neck: Normal range of motion. Neck supple. No JVD present. No tracheal deviation present. No thyromegaly present.  Cardiovascular: Normal rate and regular rhythm.  Exam reveals no gallop and no friction rub.   Murmur heard. Pulmonary/Chest: Effort normal and breath sounds normal. No respiratory distress. She has no wheezes. She has no rales.  Musculoskeletal: She exhibits edema (1+-2+ pitting edema bilat).  Lymphadenopathy:    She has no cervical adenopathy.  Skin: She is not diaphoretic.  Vitals reviewed.     Assessment & Plan:     1. Depression, major, single episode, mild ([place]) Will restart mirtazapine as below to see if this will help her depression symptoms. She has used this in the past successfully. I will see her back in 4-6 weeks to recheck symptoms. - mirtazapine (REMERON) 7.5 MG tablet; Take 1 tablet (7.5 mg total) by mouth at bedtime.   Dispense: 30 tablet; Refill: 0  2. Orthopnea Worsening per patient. She reports also due to shoulder injury she can't lie flat. She is having to sleep in a recliner nightly. She is scheduled to see her Cardiologist tomorrow.   3. SOB (shortness of breath) on exertion Will check labs as below and f/u pending results. Concerned for worsening heart failure. She is to see her cardiologist tomorrow. - CBC w/Diff/Platelet - COMPLETE METABOLIC PANEL WITH GFR - B Nat Peptide  4. Bilateral lower extremity edema See above medical treatment plan. - CBC w/Diff/Platelet - COMPLETE METABOLIC PANEL WITH GFR - B Nat Peptide  5. Abnormal blood sugar A1c improved today to 5.3. - POCT glycosylated hemoglobin (Hb A1C)  6. Chronic obstructive pulmonary disease,  unspecified COPD type ([place]) Stable today/  7. Chronic congestive heart failure, unspecified heart failure type ([place]) See above medical treatment plan for #2 and #3.  - CBC w/Diff/Platelet - COMPLETE METABOLIC PANEL WITH GFR - B Nat Peptide  8. Hypokalemia Labs revealed low potassium at 3.0. Will start potassium supplementation. - potassium chloride SA (K-DUR,KLOR-CON) 20 MEQ tablet; Take 1 tablet (20 mEq total) by mouth daily.  Dispense: 90 tablet; Refill: 1  9. Need for influenza vaccination Flu vaccine given today without complication. Patient sat upright for 15 minutes to check for adverse reaction before being released. - Flu vaccine HIGH DOSE PF (Fluzone High dose)       Mar Daring, PA-C  [facility]

## 2017-05-20 NOTE — Patient Instructions (Signed)
Mirtazapine tablets What is this medicine? MIRTAZAPINE (mir TAZ a peen) is used to treat depression. This medicine may be used for other purposes; ask your health care provider or pharmacist if you have questions. COMMON BRAND NAME(S): Remeron What should I tell my health care provider before I take this medicine? They need to know if you have any of these conditions: -bipolar disorder -glaucoma -kidney disease -liver disease -suicidal thoughts -an unusual or allergic reaction to mirtazapine, other medicines, foods, dyes, or preservatives -pregnant or trying to get pregnant -breast-feeding How should I use this medicine? Take this medicine by mouth with a glass of water. Follow the directions on the prescription label. Take your medicine at regular intervals. Do not take your medicine more often than directed. Do not stop taking this medicine suddenly except upon the advice of your doctor. Stopping this medicine too quickly may cause serious side effects or your condition may worsen. A special MedGuide will be given to you by the pharmacist with each prescription and refill. Be sure to read this information carefully each time. Talk to your pediatrician regarding the use of this medicine in children. Special care may be needed. Overdosage: If you think you have taken too much of this medicine contact a poison control center or emergency room at once. NOTE: This medicine is only for you. Do not share this medicine with others. What if I miss a dose? If you miss a dose, take it as soon as you can. If it is almost time for your next dose, take only that dose. Do not take double or extra doses. What may interact with this medicine? Do not take this medicine with any of the following medications: -linezolid -MAOIs like Carbex, Eldepryl, Marplan, Nardil, and Parnate -methylene blue (injected into a vein) This medicine may also interact with the following medications: -alcohol -antiviral  medicines for HIV or AIDS -certain medicines that treat or prevent blood clots like warfarin -certain medicines for depression, anxiety, or psychotic disturbances -certain medicines for fungal infections like ketoconazole and itraconazole -certain medicines for migraine headache like almotriptan, eletriptan, frovatriptan, naratriptan, rizatriptan, sumatriptan, zolmitriptan -certain medicines for seizures like carbamazepine or phenytoin -certain medicines for sleep -cimetidine -erythromycin -fentanyl -lithium -medicines for blood pressure -nefazodone -rasagiline -rifampin -supplements like St. John's wort, kava kava, valerian -tramadol -tryptophan This list may not describe all possible interactions. Give your health care provider a list of all the medicines, herbs, non-prescription drugs, or dietary supplements you use. Also tell them if you smoke, drink alcohol, or use illegal drugs. Some items may interact with your medicine. What should I watch for while using this medicine? Tell your doctor if your symptoms do not get better or if they get worse. Visit your doctor or health care professional for regular checks on your progress. Because it may take several weeks to see the full effects of this medicine, it is important to continue your treatment as prescribed by your doctor. Patients and their families should watch out for new or worsening thoughts of suicide or depression. Also watch out for sudden changes in feelings such as feeling anxious, agitated, panicky, irritable, hostile, aggressive, impulsive, severely restless, overly excited and hyperactive, or not being able to sleep. If this happens, especially at the beginning of treatment or after a change in dose, call your health care professional. You may get drowsy or dizzy. Do not drive, use machinery, or do anything that needs mental alertness until you know how this medicine affects you. Do not   stand or sit up quickly, especially if  you are an older patient. This reduces the risk of dizzy or fainting spells. Alcohol may interfere with the effect of this medicine. Avoid alcoholic drinks. This medicine may cause dry eyes and blurred vision. If you wear contact lenses you may feel some discomfort. Lubricating drops may help. See your eye doctor if the problem does not go away or is severe. Your mouth may get dry. Chewing sugarless gum or sucking hard candy, and drinking plenty of water may help. Contact your doctor if the problem does not go away or is severe. What side effects may I notice from receiving this medicine? Side effects that you should report to your doctor or health care professional as soon as possible: -allergic reactions like skin rash, itching or hives, swelling of the face, lips, or tongue -anxious -changes in vision -chest pain -confusion -elevated mood, decreased need for sleep, racing thoughts, impulsive behavior -eye pain -fast, irregular heartbeat -feeling faint or lightheaded, falls -feeling agitated, angry, or irritable -fever or chills, sore throat -hallucination, loss of contact with reality -loss of balance or coordination -mouth sores -redness, blistering, peeling or loosening of the skin, including inside the mouth -restlessness, pacing, inability to keep still -seizures -stiff muscles -suicidal thoughts or other mood changes -trouble passing urine or change in the amount of urine -trouble sleeping -unusual bleeding or bruising -unusually weak or tired -vomiting Side effects that usually do not require medical attention (report to your doctor or health care professional if they continue or are bothersome): -change in appetite -constipation -dizziness -dry mouth -muscle aches or pains -nausea -tired -weight gain This list may not describe all possible side effects. Call your doctor for medical advice about side effects. You may report side effects to FDA at 1-800-FDA-1088. Where  should I keep my medicine? Keep out of the reach of children. Store at room temperature between 15 and 30 degrees C (59 and 86 degrees F) Protect from light and moisture. Throw away any unused medicine after the expiration date. NOTE: This sheet is a summary. It may not cover all possible information. If you have questions about this medicine, talk to your doctor, pharmacist, or health care provider.  2018 Elsevier/Gold Standard (2015-12-14 17:30:45)  

## 2017-05-21 ENCOUNTER — Telehealth: Payer: Self-pay

## 2017-05-21 DIAGNOSIS — E785 Hyperlipidemia, unspecified: Secondary | ICD-10-CM | POA: Diagnosis not present

## 2017-05-21 DIAGNOSIS — I340 Nonrheumatic mitral (valve) insufficiency: Secondary | ICD-10-CM | POA: Diagnosis not present

## 2017-05-21 DIAGNOSIS — I10 Essential (primary) hypertension: Secondary | ICD-10-CM | POA: Diagnosis not present

## 2017-05-21 DIAGNOSIS — F320 Major depressive disorder, single episode, mild: Secondary | ICD-10-CM

## 2017-05-21 DIAGNOSIS — I361 Nonrheumatic tricuspid (valve) insufficiency: Secondary | ICD-10-CM | POA: Diagnosis not present

## 2017-05-21 LAB — CBC WITH DIFFERENTIAL/PLATELET
BASOS PCT: 0.8 %
Basophils Absolute: 50 cells/uL (ref 0–200)
Eosinophils Absolute: 170 cells/uL (ref 15–500)
Eosinophils Relative: 2.7 %
HCT: 38.3 % (ref 35.0–45.0)
Hemoglobin: 13.5 g/dL (ref 11.7–15.5)
LYMPHS ABS: 2035 {cells}/uL (ref 850–3900)
MCH: 32.5 pg (ref 27.0–33.0)
MCHC: 35.2 g/dL (ref 32.0–36.0)
MCV: 92.3 fL (ref 80.0–100.0)
MPV: 12 fL (ref 7.5–12.5)
Monocytes Relative: 6.2 %
Neutro Abs: 3654 cells/uL (ref 1500–7800)
Neutrophils Relative %: 58 %
PLATELETS: 212 10*3/uL (ref 140–400)
RBC: 4.15 10*6/uL (ref 3.80–5.10)
RDW: 12 % (ref 11.0–15.0)
TOTAL LYMPHOCYTE: 32.3 %
WBC: 6.3 10*3/uL (ref 3.8–10.8)
WBCMIX: 391 {cells}/uL (ref 200–950)

## 2017-05-21 LAB — COMPLETE METABOLIC PANEL WITH GFR
AG RATIO: 1.6 (calc) (ref 1.0–2.5)
ALT: 10 U/L (ref 6–29)
AST: 20 U/L (ref 10–35)
Albumin: 3.9 g/dL (ref 3.6–5.1)
Alkaline phosphatase (APISO): 86 U/L (ref 33–130)
BUN: 10 mg/dL (ref 7–25)
CALCIUM: 9 mg/dL (ref 8.6–10.4)
CO2: 30 mmol/L (ref 20–32)
CREATININE: 0.92 mg/dL (ref 0.60–0.93)
Chloride: 95 mmol/L — ABNORMAL LOW (ref 98–110)
GFR, EST AFRICAN AMERICAN: 73 mL/min/{1.73_m2} (ref >=60)
GFR, EST NON AFRICAN AMERICAN: 63 mL/min/{1.73_m2} (ref >=60)
Globulin: 2.5 g/dL (calc) (ref 1.9–3.7)
Glucose, Bld: 122 mg/dL — ABNORMAL HIGH (ref 65–99)
POTASSIUM: 3 mmol/L — AB (ref 3.5–5.3)
Sodium: 136 mmol/L (ref 135–146)
TOTAL PROTEIN: 6.4 g/dL (ref 6.1–8.1)
Total Bilirubin: 0.6 mg/dL (ref 0.2–1.2)

## 2017-05-21 LAB — BRAIN NATRIURETIC PEPTIDE: BRAIN NATRIURETIC PEPTIDE: 653 pg/mL — AB (ref <100)

## 2017-05-21 MED ORDER — POTASSIUM CHLORIDE CRYS ER 20 MEQ PO TBCR: 20.0000 meq | EXTENDED_RELEASE_TABLET | Freq: Every day | ORAL | 1 refills | Status: DC

## 2017-05-21 NOTE — Telephone Encounter (Signed)
-----   Message from Mar Daring, [place] sent at 05/21/2017 12:36 PM EDT ----- BNP up slightly indicating possible small flare of heart failure. Discuss with cardiology about increasing furosemide.  Potassium low at 3.0 most likely due to furosemide. Recommend potassium supplement which I will send to pharmacy for you and we can recheck in 2-4 weeks.

## 2017-05-21 NOTE — Telephone Encounter (Signed)
LMTCB

## 2017-05-22 MED ORDER — MIRTAZAPINE 7.5 MG PO TABS: 7.5000 mg | ORAL_TABLET | Freq: Every day | ORAL | 0 refills | Status: DC

## 2017-05-22 NOTE — Telephone Encounter (Signed)
Pt states [name] has not rec'd the Rx for mirtazapine (REMERON) 7.5 MG tablet   CB#[phone]/MW

## 2017-05-22 NOTE — Telephone Encounter (Signed)
Please review. Thanks!  

## 2017-05-22 NOTE — Telephone Encounter (Signed)
Resent Mirta[facility]apine to [facility]

## 2017-06-16 ENCOUNTER — Other Ambulatory Visit: Payer: Self-pay | Admitting: Physician Assistant

## 2017-06-16 DIAGNOSIS — F32A Depression, unspecified: Secondary | ICD-10-CM

## 2017-06-16 DIAGNOSIS — F329 Major depressive disorder, single episode, unspecified: Secondary | ICD-10-CM

## 2017-06-16 NOTE — Telephone Encounter (Signed)
Refill request for Alprazolam. Patient was seen 10/23 and was restarted on Mirtazapine. Didn't know if it was ok to refill the Alprazolam? Patient is scheduled to see you on 11/26 for Depression  Follow-up  Thanks,  -Leilani Cespedes

## 2017-06-23 ENCOUNTER — Ambulatory Visit (INDEPENDENT_AMBULATORY_CARE_PROVIDER_SITE_OTHER): Payer: Medicare Other | Admitting: Physician Assistant

## 2017-06-23 ENCOUNTER — Encounter: Payer: Self-pay | Admitting: Physician Assistant

## 2017-06-23 VITALS — BP 140/82 | HR 66 | Temp 98.3°F | Resp 16 | Wt 128.0 lb

## 2017-06-23 DIAGNOSIS — E876 Hypokalemia: Secondary | ICD-10-CM | POA: Diagnosis not present

## 2017-06-23 DIAGNOSIS — F3341 Major depressive disorder, recurrent, in partial remission: Secondary | ICD-10-CM | POA: Diagnosis not present

## 2017-06-23 DIAGNOSIS — Z720 Tobacco use: Secondary | ICD-10-CM | POA: Diagnosis not present

## 2017-06-23 DIAGNOSIS — K5900 Constipation, unspecified: Secondary | ICD-10-CM | POA: Diagnosis not present

## 2017-06-23 DIAGNOSIS — F411 Generalized anxiety disorder: Secondary | ICD-10-CM

## 2017-06-23 MED ORDER — MIRTAZAPINE 7.5 MG PO TABS: 7.5000 mg | ORAL_TABLET | Freq: Every day | ORAL | 5 refills | Status: DC

## 2017-06-23 MED ORDER — POTASSIUM CHLORIDE CRYS ER 20 MEQ PO TBCR: 20.0000 meq | EXTENDED_RELEASE_TABLET | Freq: Every day | ORAL | 1 refills | Status: DC

## 2017-06-23 MED ORDER — ALPRAZOLAM 0.5 MG PO TABS: 0.5000 mg | ORAL_TABLET | Freq: Two times a day (BID) | ORAL | 5 refills | Status: DC | PRN

## 2017-06-23 MED ORDER — POLYETHYLENE GLYCOL 3350 17 GM/SCOOP PO POWD: ORAL | 0 refills | Status: DC

## 2017-06-23 MED ORDER — POLYETHYLENE GLYCOL 3350 17 GM/SCOOP PO POWD: ORAL | 1 refills | Status: DC

## 2017-06-23 NOTE — Progress Notes (Signed)
Patient: [name] [name] Female    DOB: May 28, 1946   71 y.o.   MRN: [id number] Visit Date: 06/23/2017  Today's Provider: Mar Daring, PA-C   No chief complaint on file.  Subjective:    HPI  Depression, Follow up:  The patient was last seen for Depression 4 weeks ago. Changes made since that visit include restart Mirtazapine.  She reports excellent compliance with treatment. She is not having side effects. .  She complains of depressed mood and fatigue.  She denies current suicidal and homicidal plan or intent.     No Known Allergies   Current Outpatient Medications:  .  ALPRAZolam (XANAX) 0.5 MG tablet, TAKE 1 TABLET BY MOUTH TWICE DAILY AS NEEDED FOR ANXIETY, Disp: 60 tablet, Rfl: 0 .  aspirin 81 MG tablet, Take 81 mg by mouth daily., Disp: , Rfl:  .  citalopram (CELEXA) 20 MG tablet, TAKE 1 TABLET BY MOUTH EVERY DAY, Disp: 90 tablet, Rfl: 1 .  Fluticasone-Salmeterol (ADVAIR) 250-50 MCG/DOSE AEPB, Inhale 1 puff into the lungs 2 (two) times daily., Disp: 14 each, Rfl: 1 .  furosemide (LASIX) 20 MG tablet, TAKE 1 TABLET(20 MG) BY MOUTH DAILY, Disp: 30 tablet, Rfl: 5 .  lisinopril (PRINIVIL,ZESTRIL) 40 MG tablet, Take 40 mg by mouth daily. , Disp: , Rfl:  .  metoprolol succinate (TOPROL-XL) 50 MG 24 hr tablet, TAKE 1 TABLET(50 MG) BY MOUTH DAILY. FOLLOW UP, Disp: 90 tablet, Rfl: 1 .  mirtazapine (REMERON) 7.5 MG tablet, Take 1 tablet (7.5 mg total) by mouth at bedtime., Disp: 30 tablet, Rfl: 0 .  pantoprazole (PROTONIX) 40 MG tablet, TAKE 1 TABLET(40 MG) BY MOUTH DAILY (Patient taking differently: TAKE 1 TABLET(40 MG) BY MOUTH DAILY (per patient she takes it PRN)), Disp: 30 tablet, Rfl: 1 .  potassium chloride SA (K-DUR,KLOR-CON) 20 MEQ tablet, Take 1 tablet (20 mEq total) by mouth daily., Disp: 90 tablet, Rfl: 1 .  simvastatin (ZOCOR) 10 MG tablet, Take 1 tablet (10 mg total) by mouth daily., Disp: 90 tablet, Rfl: 3 .  triamcinolone cream (KENALOG) 0.1 %, Apply 1  application topically 2 (two) times daily., Disp: 80 g, Rfl: 0 .  albuterol (PROVENTIL HFA;VENTOLIN HFA) 108 (90 Base) MCG/ACT inhaler, Inhale 2 puffs into the lungs every 4 (four) hours as needed for wheezing or shortness of breath. (Patient not taking: Reported on 06/23/2017), Disp: 1 Inhaler, Rfl: 1 .  nicotine (NICODERM CQ - DOSED IN MG/24 HOURS) 21 mg/24hr patch, Place 1 patch (21 mg total) onto the skin daily. Reported on 08/08/2015 (Patient not taking: Reported on 06/23/2017), Disp: 28 patch, Rfl: 0  Review of Systems  Constitutional: Positive for fatigue.  HENT: Negative.   Respiratory: Positive for cough and shortness of breath. Negative for chest tightness and wheezing.   Cardiovascular: Positive for leg swelling (ankles swelling for the past month-taking Lasix almost evryday.). Negative for chest pain and palpitations.  Gastrointestinal: Positive for abdominal distention (occasionally if she has not had a BM in a couple of days) and constipation. Negative for abdominal pain, diarrhea, nausea and vomiting.  Genitourinary: Positive for enuresis and frequency.  Neurological: Negative for dizziness, light-headedness and headaches.       Reports-"pins/needles" on the tips of her fingers for last week.    Social History   Tobacco Use  . Smoking status: Current Every Day Smoker    Packs/day: 0.50    Years: 50.00    Pack years: 25.00  . Smokeless tobacco:  Never Used  Substance Use Topics  . Alcohol use: Yes    Alcohol/week: 4.2 oz    Types: 7 Glasses of wine per week    Comment: 1-2 glasses wine daily   Objective:   BP 140/82 (BP Location: Left Arm, Patient Position: Sitting, Cuff Size: Normal)   Pulse 66   Temp 98.3 F (36.8 C) (Oral)   Resp 16   Wt 128 lb (58.1 kg)   SpO2 95%   BMI 22.67 kg/m    Physical Exam  Constitutional: She appears well-developed and well-nourished. No distress.  Neck: Normal range of motion. Neck supple. No JVD present. No tracheal deviation  present. No thyromegaly present.  Cardiovascular: Normal rate, regular rhythm and normal heart sounds. Exam reveals no gallop and no friction rub.  No murmur heard. Pulmonary/Chest: Effort normal. No respiratory distress. She has wheezes. She has rales.  Lymphadenopathy:    She has no cervical adenopathy.  Skin: She is not diaphoretic.  Vitals reviewed.  Depression screen [facility] 2/9 06/23/2017 05/20/2017 08/28/2016 08/08/2015  Decreased Interest 0 3 3 3   Down, Depressed, Hopeless 1 0 2 1  PHQ - 2 Score 1 3 5 4   Altered sleeping 3 1 3 1   Tired, decreased energy 2 3 3 3   Change in appetite 0 3 2 0  Feeling bad or failure about yourself  0 0 0 0  Trouble concentrating 1 3 0 1  Moving slowly or fidgety/restless 1 2 0 0  Suicidal thoughts 0 0 0 0  PHQ-9 Score 8 15 13 9   Difficult doing work/chores Very difficult Somewhat difficult Somewhat difficult Somewhat difficult       Assessment & Plan:     1. Recurrent major depressive disorder, in partial remission ([facility]) Continue Mirtazapine as below as she is having improvement. I will see her back in 2-3 months for recheck. - mirtazapine (REMERON) 7.5 MG tablet; Take 1 tablet (7.5 mg total) by mouth at bedtime.  Dispense: 30 tablet; Refill: 5  2. GAD (generalized anxiety disorder) Stable. Diagnosis pulled for medication refill. Continue current medical treatment plan. - ALPRAZolam (XANAX) 0.5 MG tablet; Take 1 tablet (0.5 mg total) by mouth 2 (two) times daily as needed. for anxiety  Dispense: 60 tablet; Refill: 5  3. Current tobacco use Does not wish to quit smoking. Does have some increased rales that clear with coughing. Advised to start using Albuterol inhaler for short while and call if symptoms worsen.   4. Hypokalemia To be taken with furosemide.  - potassium chloride SA (K-DUR,KLOR-CON) 20 MEQ tablet; Take 1 tablet (20 mEq total) by mouth daily.  Dispense: 90 tablet; Refill: 1  5. Constipation, unspecified constipation type Will do  Miralax clean out as below over the weekend to see if this helps constipation. If constipation improves, may consider adding a medication for nocturia patient is having that is affecting her sleep. Will address this in 2-3 months when she returns for AWV.  - polyethylene glycol powder (GLYCOLAX/MIRALAX) powder; Put 1/2 container in 16 oz gatorade and drink over 1 hour, then put second half in a 16 oz gatorade and drink over hour for bowel clean  Dispense: 289 g; Refill: [place], PA-C  [facility]

## 2017-06-23 NOTE — Patient Instructions (Signed)
Polyethylene Glycol powder What is this medicine? POLYETHYLENE GLYCOL 3350 (pol ee ETH i leen; GLYE col) powder is a laxative used to treat constipation. It increases the amount of water in the stool. Bowel movements become easier and more frequent. This medicine may be used for other purposes; ask your health care provider or pharmacist if you have questions. COMMON BRAND NAME(S): GaviLax, GIALAX, GlycoLax, MiraLax, Smooth LAX, [facility] What should I tell my health care provider before I take this medicine? They need to know if you have any of these conditions: -a history of blockage of the stomach or intestine -current abdomen distension or pain -difficulty swallowing -diverticulitis, ulcerative colitis, or other chronic bowel disease -phenylketonuria -an unusual or allergic reaction to polyethylene glycol, other medicines, dyes, or preservatives -pregnant or trying to get pregnant -breast-feeding How should I use this medicine? Take this medicine by mouth. The bottle has a measuring cap that is marked with a line. Pour the powder into the cap up to the marked line (the dose is about 1 heaping tablespoon). Add the powder in the cap to a full glass (4 to 8 ounces or 120 to 240 ml) of water, juice, soda, coffee or tea. Mix the powder well. Drink the solution. Take exactly as directed. Do not take your medicine more often than directed. Talk to your pediatrician regarding the use of this medicine in children. Special care may be needed. Overdosage: If you think you have taken too much of this medicine contact a poison control center or emergency room at once. NOTE: This medicine is only for you. Do not share this medicine with others. What if I miss a dose? If you miss a dose, take it as soon as you can. If it is almost time for your next dose, take only that dose. Do not take double or extra doses. What may interact with this medicine? Interactions are not expected. This list may not  describe all possible interactions. Give your health care provider a list of all the medicines, herbs, non-prescription drugs, or dietary supplements you use. Also tell them if you smoke, drink alcohol, or use illegal drugs. Some items may interact with your medicine. What should I watch for while using this medicine? Do not use for more than 2 weeks without advice from your doctor or health care professional. It can take 2 to 4 days to have a bowel movement and to experience improvement in constipation. See your health care professional for any changes in bowel habits, including constipation, that are severe or last longer than three weeks. Always take this medicine with plenty of water. What side effects may I notice from receiving this medicine? Side effects that you should report to your doctor or health care professional as soon as possible: -diarrhea -difficulty breathing -itching of the skin, hives, or skin rash -severe bloating, pain, or distension of the stomach -vomiting Side effects that usually do not require medical attention (report to your doctor or health care professional if they continue or are bothersome): -bloating or gas -lower abdominal discomfort or cramps -nausea This list may not describe all possible side effects. Call your doctor for medical advice about side effects. You may report side effects to FDA at 1-800-FDA-1088. Where should I keep my medicine? Keep out of the reach of children. Store between 15 and 30 degrees C (59 and 86 degrees F). Throw away any unused medicine after the expiration date. NOTE: This sheet is a summary. It may not cover all   possible information. If you have questions about this medicine, talk to your doctor, pharmacist, or health care provider.  2018 Elsevier/Gold Standard (2008-02-15 16:50:45)  

## 2017-07-16 ENCOUNTER — Telehealth: Payer: Self-pay | Admitting: Physician Assistant

## 2017-07-16 NOTE — Telephone Encounter (Signed)
Increase mirtazapine to two pills at bedtime. Schedule appt in 4 weeks.   Call if symptoms still do not improve with increased dose but give it 1-2 weeks.   If patient is suicidal she needs appt ASAP.

## 2017-07-16 NOTE — Telephone Encounter (Signed)
LMTCB  Thanks,  -Aislyn Hayse 

## 2017-07-16 NOTE — Telephone Encounter (Signed)
Wells Guiles returned call and request call back. Please advise. Thanks TNP

## 2017-07-16 NOTE — Telephone Encounter (Signed)
Spoke with [name] [name] patient's daughter and advised as directed below. Patient's daughter agreed with plan.[name] [name] was also advised that if mother is suicidal that she needs to be seen or go to the ER. She agreed.  Thanks,  -Stefanee Mckell

## 2017-07-16 NOTE — Telephone Encounter (Signed)
Pt daught [name] [name] is requesting a call back.  Pt daughter states pt in a bad place, crying all the time and depressed.  Pt was taken off one of her medications and that is when her mood changed.  Please advise.  YW#[phone]/[place]

## 2017-07-16 NOTE — Telephone Encounter (Signed)
Before I call her just want to make sure if she is going to need an appointment depending on the situation?

## 2017-08-15 ENCOUNTER — Ambulatory Visit (INDEPENDENT_AMBULATORY_CARE_PROVIDER_SITE_OTHER): Payer: Medicare Other | Admitting: Physician Assistant

## 2017-08-15 ENCOUNTER — Encounter: Payer: Self-pay | Admitting: Physician Assistant

## 2017-08-15 VITALS — BP 110/80 | HR 64 | Temp 98.1°F | Resp 16 | Ht 63.0 in | Wt 130.4 lb

## 2017-08-15 DIAGNOSIS — F3341 Major depressive disorder, recurrent, in partial remission: Secondary | ICD-10-CM | POA: Diagnosis not present

## 2017-08-15 DIAGNOSIS — J418 Mixed simple and mucopurulent chronic bronchitis: Secondary | ICD-10-CM

## 2017-08-15 MED ORDER — ESCITALOPRAM OXALATE 10 MG PO TABS: ORAL_TABLET | ORAL | 0 refills | Status: DC

## 2017-08-15 NOTE — Patient Instructions (Signed)
Escitalopram tablets What is this medicine? ESCITALOPRAM (es sye TAL oh pram) is used to treat depression and certain types of anxiety. This medicine may be used for other purposes; ask your health care provider or pharmacist if you have questions. COMMON BRAND NAME(S): Lexapro What should I tell my health care provider before I take this medicine? They need to know if you have any of these conditions: -bipolar disorder or a family history of bipolar disorder -diabetes -glaucoma -heart disease -kidney or liver disease -receiving electroconvulsive therapy -seizures (convulsions) -suicidal thoughts, plans, or attempt by you or a family member -an unusual or allergic reaction to escitalopram, the related drug citalopram, other medicines, foods, dyes, or preservatives -pregnant or trying to become pregnant -breast-feeding How should I use this medicine? Take this medicine by mouth with a glass of water. Follow the directions on the prescription label. You can take it with or without food. If it upsets your stomach, take it with food. Take your medicine at regular intervals. Do not take it more often than directed. Do not stop taking this medicine suddenly except upon the advice of your doctor. Stopping this medicine too quickly may cause serious side effects or your condition may worsen. A special MedGuide will be given to you by the pharmacist with each prescription and refill. Be sure to read this information carefully each time. Talk to your pediatrician regarding the use of this medicine in children. Special care may be needed. Overdosage: If you think you have taken too much of this medicine contact a poison control center or emergency room at once. NOTE: This medicine is only for you. Do not share this medicine with others. What if I miss a dose? If you miss a dose, take it as soon as you can. If it is almost time for your next dose, take only that dose. Do not take double or extra  doses. What may interact with this medicine? Do not take this medicine with any of the following medications: -certain medicines for fungal infections like fluconazole, itraconazole, ketoconazole, posaconazole, voriconazole -cisapride -citalopram -dofetilide -dronedarone -linezolid -MAOIs like Carbex, Eldepryl, Marplan, Nardil, and Parnate -methylene blue (injected into a vein) -pimozide -thioridazine -ziprasidone This medicine may also interact with the following medications: -alcohol -amphetamines -aspirin and aspirin-like medicines -carbamazepine -certain medicines for depression, anxiety, or psychotic disturbances -certain medicines for migraine headache like almotriptan, eletriptan, frovatriptan, naratriptan, rizatriptan, sumatriptan, zolmitriptan -certain medicines for sleep -certain medicines that treat or prevent blood clots like warfarin, enoxaparin, dalteparin -cimetidine -diuretics -fentanyl -furazolidone -isoniazid -lithium -metoprolol -NSAIDs, medicines for pain and inflammation, like ibuprofen or naproxen -other medicines that prolong the QT interval (cause an abnormal heart rhythm) -procarbazine -rasagiline -supplements like St. John's wort, kava kava, valerian -tramadol -tryptophan This list may not describe all possible interactions. Give your health care provider a list of all the medicines, herbs, non-prescription drugs, or dietary supplements you use. Also tell them if you smoke, drink alcohol, or use illegal drugs. Some items may interact with your medicine. What should I watch for while using this medicine? Tell your doctor if your symptoms do not get better or if they get worse. Visit your doctor or health care professional for regular checks on your progress. Because it may take several weeks to see the full effects of this medicine, it is important to continue your treatment as prescribed by your doctor. Patients and their families should watch out for  new or worsening thoughts of suicide or depression. Also watch out for   sudden changes in feelings such as feeling anxious, agitated, panicky, irritable, hostile, aggressive, impulsive, severely restless, overly excited and hyperactive, or not being able to sleep. If this happens, especially at the beginning of treatment or after a change in dose, call your health care professional. You may get drowsy or dizzy. Do not drive, use machinery, or do anything that needs mental alertness until you know how this medicine affects you. Do not stand or sit up quickly, especially if you are an older patient. This reduces the risk of dizzy or fainting spells. Alcohol may interfere with the effect of this medicine. Avoid alcoholic drinks. Your mouth may get dry. Chewing sugarless gum or sucking hard candy, and drinking plenty of water may help. Contact your doctor if the problem does not go away or is severe. What side effects may I notice from receiving this medicine? Side effects that you should report to your doctor or health care professional as soon as possible: -allergic reactions like skin rash, itching or hives, swelling of the face, lips, or tongue -anxious -black, tarry stools -changes in vision -confusion -elevated mood, decreased need for sleep, racing thoughts, impulsive behavior -eye pain -fast, irregular heartbeat -feeling faint or lightheaded, falls -feeling agitated, angry, or irritable -hallucination, loss of contact with reality -loss of balance or coordination -loss of memory -painful or prolonged erections -restlessness, pacing, inability to keep still -seizures -stiff muscles -suicidal thoughts or other mood changes -trouble sleeping -unusual bleeding or bruising -unusually weak or tired -vomiting Side effects that usually do not require medical attention (report to your doctor or health care professional if they continue or are bothersome): -changes in appetite -change in sex  drive or performance -headache -increased sweating -indigestion, nausea -tremors This list may not describe all possible side effects. Call your doctor for medical advice about side effects. You may report side effects to FDA at 1-800-FDA-1088. Where should I keep my medicine? Keep out of reach of children. Store at room temperature between 15 and 30 degrees C (59 and 86 degrees F). Throw away any unused medicine after the expiration date. NOTE: This sheet is a summary. It may not cover all possible information. If you have questions about this medicine, talk to your doctor, pharmacist, or health care provider.  2018 Elsevier/Gold Standard (2015-12-18 13:20:23)  

## 2017-08-15 NOTE — Progress Notes (Signed)
Patient: [name] [name] Female    DOB: [birth year]   72 y.o.   MRN: [id number] Visit Date: 08/15/2017  Today's Provider: Mar Daring, PA-C   Chief Complaint  Patient presents with  . Depression   Subjective:    HPI  Depression, Follow-up  She  was last seen for this 2 months ago. Changes made at last visit include increase Mirtazapine.  Patient reports she has been out of Mirtazapine for about one week.   She reports excellent compliance with treatment. She is not having side effects.   She reports good tolerance of treatment. Current symptoms include: depressed mood She feels she is Unchanged since last visit. ------------------------------------------------------------------------     No Known Allergies   Current Outpatient Medications:  .  ALPRAZolam (XANAX) 0.5 MG tablet, Take 1 tablet (0.5 mg total) by mouth 2 (two) times daily as needed. for anxiety, Disp: 60 tablet, Rfl: 5 .  aspirin 81 MG tablet, Take 81 mg by mouth daily., Disp: , Rfl:  .  Fluticasone-Salmeterol (ADVAIR) 250-50 MCG/DOSE AEPB, Inhale 1 puff into the lungs 2 (two) times daily., Disp: 14 each, Rfl: 1 .  furosemide (LASIX) 20 MG tablet, TAKE 1 TABLET(20 MG) BY MOUTH DAILY, Disp: 30 tablet, Rfl: 5 .  lisinopril (PRINIVIL,ZESTRIL) 40 MG tablet, Take 40 mg by mouth daily. , Disp: , Rfl:  .  metoprolol succinate (TOPROL-XL) 50 MG 24 hr tablet, TAKE 1 TABLET(50 MG) BY MOUTH DAILY. FOLLOW UP, Disp: 90 tablet, Rfl: 1 .  mirtazapine (REMERON) 7.5 MG tablet, Take 1 tablet (7.5 mg total) by mouth at bedtime., Disp: 30 tablet, Rfl: 5 .  pantoprazole (PROTONIX) 40 MG tablet, TAKE 1 TABLET(40 MG) BY MOUTH DAILY (Patient taking differently: TAKE 1 TABLET(40 MG) BY MOUTH DAILY (per patient she takes it PRN)), Disp: 30 tablet, Rfl: 1 .  potassium chloride SA (K-DUR,KLOR-CON) 20 MEQ tablet, Take 1 tablet (20 mEq total) by mouth daily., Disp: 90 tablet, Rfl: 1 .  simvastatin (ZOCOR) 10 MG tablet, Take 1  tablet (10 mg total) by mouth daily., Disp: 90 tablet, Rfl: 3 .  triamcinolone cream (KENALOG) 0.1 %, Apply 1 application topically 2 (two) times daily., Disp: 80 g, Rfl: 0 .  albuterol (PROVENTIL HFA;VENTOLIN HFA) 108 (90 Base) MCG/ACT inhaler, Inhale 2 puffs into the lungs every 4 (four) hours as needed for wheezing or shortness of breath. (Patient not taking: Reported on 06/23/2017), Disp: 1 Inhaler, Rfl: 1 .  polyethylene glycol powder (GLYCOLAX/MIRALAX) powder, Put 1/2 container in 16 oz gatorade and drink over 1 hour, then put second half in a 16 oz gatorade and drink over hour for bowel clean (Patient not taking: Reported on 08/15/2017), Disp: 289 g, Rfl: 1  Review of Systems  Constitutional: Negative.   Respiratory: Positive for chest tightness and wheezing. Negative for cough and shortness of breath.   Cardiovascular: Negative.   Gastrointestinal: Negative.   Psychiatric/Behavioral: Positive for agitation. The patient is nervous/anxious.     Social History   Tobacco Use  . Smoking status: Current Every Day Smoker    Packs/day: 0.50    Years: 50.00    Pack years: 25.00  . Smokeless tobacco: Never Used  Substance Use Topics  . Alcohol use: Yes    Alcohol/week: 4.2 oz    Types: 7 Glasses of wine per week    Comment: 1-2 glasses wine daily   Objective:   BP 110/80 (BP Location: Left Arm, Patient Position: Sitting, Cuff Size: Normal)  Pulse 64   Temp 98.1 F (36.7 C) (Oral)   Resp 16   Ht 5\' 3"  (1.6 m)   Wt 130 lb 6.4 oz (59.1 kg)   SpO2 97%   BMI 23.10 kg/m  Vitals:   08/15/17 1336  BP: 110/80  Pulse: 64  Resp: 16  Temp: 98.1 F (36.7 C)  TempSrc: Oral  SpO2: 97%  Weight: 130 lb 6.4 oz (59.1 kg)  Height: 5\' 3"  (1.6 m)     Physical Exam  Constitutional: She appears well-developed and well-nourished. No distress.  Neck: Normal range of motion. Neck supple. No JVD present. No tracheal deviation present. No thyromegaly present.  Cardiovascular: Normal rate and  regular rhythm. Exam reveals no gallop and no friction rub.  Murmur heard. Pulmonary/Chest: Effort normal. No respiratory distress. She has no decreased breath sounds. She has no wheezes. She has rhonchi (heard anterior chest, not posterior). She has no rales.  Lymphadenopathy:    She has no cervical adenopathy.  Skin: She is not diaphoretic.  Psychiatric: She exhibits a depressed mood.  Vitals reviewed.      Assessment & Plan:     1. Recurrent major depressive disorder, in partial remission ([place]) Stop mirtazapine. Start lexapro as below. I will see her back in 4-6 weeks for recheck and AWV.  - escitalopram (LEXAPRO) 10 MG tablet; Take 0.5 tab (5mg ) PO q hs x 1 week, then may increase to 1 tab (10mg ) x 1 week, then 1.5 tab (15mg ) x 1 week, then 2 tabs (20mg ) PO q hs.  Dispense: 60 tablet; Refill: 0  2. Mixed simple and mucopurulent chronic bronchitis ([place]) Worsened currently. Will add spiriva respimat 1.25% x 2 weeks. She is to call if improving and will send in as Rx.        Mar Daring, PA-C  [facility]

## 2017-08-26 ENCOUNTER — Other Ambulatory Visit: Payer: Self-pay | Admitting: Physician Assistant

## 2017-08-26 DIAGNOSIS — F329 Major depressive disorder, single episode, unspecified: Secondary | ICD-10-CM

## 2017-08-26 DIAGNOSIS — F32A Depression, unspecified: Secondary | ICD-10-CM

## 2017-08-29 ENCOUNTER — Encounter: Payer: Self-pay | Admitting: Physician Assistant

## 2017-09-15 ENCOUNTER — Encounter: Payer: Self-pay | Admitting: Physician Assistant

## 2017-09-26 ENCOUNTER — Ambulatory Visit (INDEPENDENT_AMBULATORY_CARE_PROVIDER_SITE_OTHER): Payer: Medicare Other | Admitting: Physician Assistant

## 2017-09-26 ENCOUNTER — Encounter: Payer: Self-pay | Admitting: Physician Assistant

## 2017-09-26 VITALS — BP 120/80 | HR 66 | Temp 98.1°F | Resp 16 | Ht 63.0 in | Wt 123.0 lb

## 2017-09-26 DIAGNOSIS — E876 Hypokalemia: Secondary | ICD-10-CM

## 2017-09-26 DIAGNOSIS — E7800 Pure hypercholesterolemia, unspecified: Secondary | ICD-10-CM

## 2017-09-26 DIAGNOSIS — R600 Localized edema: Secondary | ICD-10-CM

## 2017-09-26 DIAGNOSIS — J441 Chronic obstructive pulmonary disease with (acute) exacerbation: Secondary | ICD-10-CM

## 2017-09-26 DIAGNOSIS — I509 Heart failure, unspecified: Secondary | ICD-10-CM | POA: Insufficient documentation

## 2017-09-26 DIAGNOSIS — Z1231 Encounter for screening mammogram for malignant neoplasm of breast: Secondary | ICD-10-CM | POA: Diagnosis not present

## 2017-09-26 DIAGNOSIS — Z0001 Encounter for general adult medical examination with abnormal findings: Secondary | ICD-10-CM | POA: Diagnosis not present

## 2017-09-26 DIAGNOSIS — Z1211 Encounter for screening for malignant neoplasm of colon: Secondary | ICD-10-CM

## 2017-09-26 DIAGNOSIS — F320 Major depressive disorder, single episode, mild: Secondary | ICD-10-CM | POA: Diagnosis not present

## 2017-09-26 DIAGNOSIS — Z1159 Encounter for screening for other viral diseases: Secondary | ICD-10-CM

## 2017-09-26 DIAGNOSIS — Z0000 Encounter for general adult medical examination without abnormal findings: Secondary | ICD-10-CM

## 2017-09-26 DIAGNOSIS — I10 Essential (primary) hypertension: Secondary | ICD-10-CM | POA: Diagnosis not present

## 2017-09-26 DIAGNOSIS — Z720 Tobacco use: Secondary | ICD-10-CM

## 2017-09-26 DIAGNOSIS — R7309 Other abnormal glucose: Secondary | ICD-10-CM | POA: Diagnosis not present

## 2017-09-26 DIAGNOSIS — Z1239 Encounter for other screening for malignant neoplasm of breast: Secondary | ICD-10-CM

## 2017-09-26 MED ORDER — AMOXICILLIN-POT CLAVULANATE 875-125 MG PO TABS
1.0000 | ORAL_TABLET | Freq: Two times a day (BID) | ORAL | 0 refills | Status: DC
Start: 2017-09-26 — End: 2017-10-28

## 2017-09-26 MED ORDER — CITALOPRAM HYDROBROMIDE 20 MG PO TABS
20.0000 mg | ORAL_TABLET | Freq: Every day | ORAL | 0 refills | Status: DC
Start: 2017-09-26 — End: 2017-10-25

## 2017-09-26 MED ORDER — PREDNISONE 10 MG (21) PO TBPK
ORAL_TABLET | ORAL | 0 refills | Status: DC
Start: 2017-09-26 — End: 2017-10-25

## 2017-09-26 MED ORDER — TIOTROPIUM BROMIDE MONOHYDRATE 1.25 MCG/ACT IN AERS: 1.0000 | INHALATION_SPRAY | Freq: Every day | RESPIRATORY_TRACT | 5 refills | Status: DC

## 2017-09-26 MED ORDER — ALBUTEROL SULFATE HFA 108 (90 BASE) MCG/ACT IN AERS: 2.0000 | INHALATION_SPRAY | RESPIRATORY_TRACT | 1 refills | Status: DC | PRN

## 2017-09-26 MED ORDER — FLUTICASONE-SALMETEROL 250-50 MCG/DOSE IN AEPB: 1.0000 | INHALATION_SPRAY | Freq: Two times a day (BID) | RESPIRATORY_TRACT | 1 refills | Status: DC

## 2017-09-26 NOTE — Patient Instructions (Signed)
Health Maintenance for Postmenopausal Women Menopause is a normal process in which your reproductive ability comes to an end. This process happens gradually over a span of months to years, usually between the ages of 22 and 9. Menopause is complete when you have missed 12 consecutive menstrual periods. It is important to talk with your health care provider about some of the most common conditions that affect postmenopausal women, such as heart disease, cancer, and bone loss (osteoporosis). Adopting a healthy lifestyle and getting preventive care can help to promote your health and wellness. Those actions can also lower your chances of developing some of these common conditions. What should I know about menopause? During menopause, you may experience a number of symptoms, such as:  Moderate-to-severe hot flashes.  Night sweats.  Decrease in sex drive.  Mood swings.  Headaches.  Tiredness.  Irritability.  Memory problems.  Insomnia.  Choosing to treat or not to treat menopausal changes is an individual decision that you make with your health care provider. What should I know about hormone replacement therapy and supplements? Hormone therapy products are effective for treating symptoms that are associated with menopause, such as hot flashes and night sweats. Hormone replacement carries certain risks, especially as you become older. If you are thinking about using estrogen or estrogen with progestin treatments, discuss the benefits and risks with your health care provider. What should I know about heart disease and stroke? Heart disease, heart attack, and stroke become more likely as you age. This may be due, in part, to the hormonal changes that your body experiences during menopause. These can affect how your body processes dietary fats, triglycerides, and cholesterol. Heart attack and stroke are both medical emergencies. There are many things that you can do to help prevent heart disease  and stroke:  Have your blood pressure checked at least every 1-2 years. High blood pressure causes heart disease and increases the risk of stroke.  If you are 53-22 years old, ask your health care provider if you should take aspirin to prevent a heart attack or a stroke.  Do not use any tobacco products, including cigarettes, chewing tobacco, or electronic cigarettes. If you need help quitting, ask your health care provider.  It is important to eat a healthy diet and maintain a healthy weight. ? Be sure to include plenty of vegetables, fruits, low-fat dairy products, and lean protein. ? Avoid eating foods that are high in solid fats, added sugars, or salt (sodium).  Get regular exercise. This is one of the most important things that you can do for your health. ? Try to exercise for at least 150 minutes each week. The type of exercise that you do should increase your heart rate and make you sweat. This is known as moderate-intensity exercise. ? Try to do strengthening exercises at least twice each week. Do these in addition to the moderate-intensity exercise.  Know your numbers.Ask your health care provider to check your cholesterol and your blood glucose. Continue to have your blood tested as directed by your health care provider.  What should I know about cancer screening? There are several types of cancer. Take the following steps to reduce your risk and to catch any cancer development as early as possible. Breast Cancer  Practice breast self-awareness. ? This means understanding how your breasts normally appear and feel. ? It also means doing regular breast self-exams. Let your health care provider know about any changes, no matter how small.  If you are 40  or older, have a clinician do a breast exam (clinical breast exam or CBE) every year. Depending on your age, family history, and medical history, it may be recommended that you also have a yearly breast X-ray (mammogram).  If you  have a family history of breast cancer, talk with your health care provider about genetic screening.  If you are at high risk for breast cancer, talk with your health care provider about having an MRI and a mammogram every year.  Breast cancer (BRCA) gene test is recommended for women who have family members with BRCA-related cancers. Results of the assessment will determine the need for genetic counseling and BRCA1 and for BRCA2 testing. BRCA-related cancers include these types: ? Breast. This occurs in males or females. ? Ovarian. ? Tubal. This may also be called fallopian tube cancer. ? Cancer of the abdominal or pelvic lining (peritoneal cancer). ? Prostate. ? Pancreatic.  Cervical, Uterine, and Ovarian Cancer Your health care provider may recommend that you be screened regularly for cancer of the pelvic organs. These include your ovaries, uterus, and vagina. This screening involves a pelvic exam, which includes checking for microscopic changes to the surface of your cervix (Pap test).  For women ages 21-65, health care providers may recommend a pelvic exam and a Pap test every three years. For women ages 79-65, they may recommend the Pap test and pelvic exam, combined with testing for human papilloma virus (HPV), every five years. Some types of HPV increase your risk of cervical cancer. Testing for HPV may also be done on women of any age who have unclear Pap test results.  Other health care providers may not recommend any screening for nonpregnant women who are considered low risk for pelvic cancer and have no symptoms. Ask your health care provider if a screening pelvic exam is right for you.  If you have had past treatment for cervical cancer or a condition that could lead to cancer, you need Pap tests and screening for cancer for at least 20 years after your treatment. If Pap tests have been discontinued for you, your risk factors (such as having a new sexual partner) need to be  reassessed to determine if you should start having screenings again. Some women have medical problems that increase the chance of getting cervical cancer. In these cases, your health care provider may recommend that you have screening and Pap tests more often.  If you have a family history of uterine cancer or ovarian cancer, talk with your health care provider about genetic screening.  If you have vaginal bleeding after reaching menopause, tell your health care provider.  There are currently no reliable tests available to screen for ovarian cancer.  Lung Cancer Lung cancer screening is recommended for adults 69-62 years old who are at high risk for lung cancer because of a history of smoking. A yearly low-dose CT scan of the lungs is recommended if you:  Currently smoke.  Have a history of at least 30 pack-years of smoking and you currently smoke or have quit within the past 15 years. A pack-year is smoking an average of one pack of cigarettes per day for one year.  Yearly screening should:  Continue until it has been 15 years since you quit.  Stop if you develop a health problem that would prevent you from having lung cancer treatment.  Colorectal Cancer  This type of cancer can be detected and can often be prevented.  Routine colorectal cancer screening usually begins at  age 42 and continues through age 45.  If you have risk factors for colon cancer, your health care provider may recommend that you be screened at an earlier age.  If you have a family history of colorectal cancer, talk with your health care provider about genetic screening.  Your health care provider may also recommend using home test kits to check for hidden blood in your stool.  A small camera at the end of a tube can be used to examine your colon directly (sigmoidoscopy or colonoscopy). This is done to check for the earliest forms of colorectal cancer.  Direct examination of the colon should be repeated every  5-10 years until age 71. However, if early forms of precancerous polyps or small growths are found or if you have a family history or genetic risk for colorectal cancer, you may need to be screened more often.  Skin Cancer  Check your skin from head to toe regularly.  Monitor any moles. Be sure to tell your health care provider: ? About any new moles or changes in moles, especially if there is a change in a mole's shape or color. ? If you have a mole that is larger than the size of a pencil eraser.  If any of your family members has a history of skin cancer, especially at a young age, talk with your health care provider about genetic screening.  Always use sunscreen. Apply sunscreen liberally and repeatedly throughout the day.  Whenever you are outside, protect yourself by wearing long sleeves, pants, a wide-brimmed hat, and sunglasses.  What should I know about osteoporosis? Osteoporosis is a condition in which bone destruction happens more quickly than new bone creation. After menopause, you may be at an increased risk for osteoporosis. To help prevent osteoporosis or the bone fractures that can happen because of osteoporosis, the following is recommended:  If you are 46-71 years old, get at least 1,000 mg of calcium and at least 600 mg of vitamin D per day.  If you are older than age 55 but younger than age 65, get at least 1,200 mg of calcium and at least 600 mg of vitamin D per day.  If you are older than age 54, get at least 1,200 mg of calcium and at least 800 mg of vitamin D per day.  Smoking and excessive alcohol intake increase the risk of osteoporosis. Eat foods that are rich in calcium and vitamin D, and do weight-bearing exercises several times each week as directed by your health care provider. What should I know about how menopause affects my mental health? Depression may occur at any age, but it is more common as you become older. Common symptoms of depression  include:  Low or sad mood.  Changes in sleep patterns.  Changes in appetite or eating patterns.  Feeling an overall lack of motivation or enjoyment of activities that you previously enjoyed.  Frequent crying spells.  Talk with your health care provider if you think that you are experiencing depression. What should I know about immunizations? It is important that you get and maintain your immunizations. These include:  Tetanus, diphtheria, and pertussis (Tdap) booster vaccine.  Influenza every year before the flu season begins.  Pneumonia vaccine.  Shingles vaccine.  Your health care provider may also recommend other immunizations. This information is not intended to replace advice given to you by your health care provider. Make sure you discuss any questions you have with your health care provider. Document Released: 09/06/2005  Document Revised: 02/02/2016 Document Reviewed: 04/18/2015 Elsevier Interactive Patient Education  2018 [employer] Inc[employer]  

## 2017-09-26 NOTE — Progress Notes (Signed)
Patient: [name] [name], Female    DOB: 1945/11/09, 72 y.o.   MRN: [id number] Visit Date: 09/26/2017  Today's Provider: Mar Daring, PA-C   Chief Complaint  Patient presents with  . Annual Exam   Subjective:    Annual wellness visit [name] [name] is a 72 y.o. female. She feels poorly. She reports exercising none. She reports she is sleeping fairly well.  Last CPE:08/28/2016 Mammogram: 09/12/2016 BI-RADS 2 Colonoscopy:02/01/2014-Polyps, Recheck in 4 years. ----------------------------------------------------------- Patient C/O cough chest congestion x's one month. Patient reports using her inhalers.   Review of Systems  Constitutional: Positive for activity change, diaphoresis and fatigue.  HENT: Positive for congestion, drooling, ear pain, postnasal drip, rhinorrhea, sinus pressure, sinus pain, sneezing, sore throat and tinnitus.   Eyes: Positive for photophobia, discharge, itching and visual disturbance.  Respiratory: Positive for apnea, cough, choking, chest tightness, shortness of breath and wheezing.   Cardiovascular: Positive for chest pain.  Gastrointestinal: Positive for abdominal distention, constipation and nausea.  Endocrine: Positive for cold intolerance, polydipsia and polyuria.  Genitourinary: Positive for enuresis and frequency.  Musculoskeletal: Positive for back pain and neck pain.  Skin: Positive for rash and wound.  Allergic/Immunologic: Positive for environmental allergies.  Neurological: Positive for dizziness, weakness, light-headedness and headaches.  Hematological: Bruises/bleeds easily.  Psychiatric/Behavioral: Positive for agitation.    Social History   Socioeconomic History  . Marital status: Widowed    Spouse name: Not on file  . Number of children: Not on file  . Years of education: Not on file  . Highest education level: Not on file  Social Needs  . Financial resource strain: Not on file  . Food insecurity - worry: Not on file   . Food insecurity - inability: Not on file  . Transportation needs - medical: Not on file  . Transportation needs - non-medical: Not on file  Occupational History  . Not on file  Tobacco Use  . Smoking status: Current Every Day Smoker    Packs/day: 0.50    Years: 50.00    Pack years: 25.00  . Smokeless tobacco: Never Used  Substance and Sexual Activity  . Alcohol use: Yes    Alcohol/week: 4.2 oz    Types: 7 Glasses of wine per week    Comment: 1-2 glasses wine daily  . Drug use: No  . Sexual activity: Not on file  Other Topics Concern  . Not on file  Social History Narrative  . Not on file    Past Medical History:  Diagnosis Date  . Anxiety   . CAD (coronary artery disease) unk  . COPD (chronic obstructive pulmonary disease) ([place])   . Depression   . Hypercholesteremia unk  . Hypertension      Patient Active Problem List   Diagnosis Date Noted  . Alcohol abuse 08/28/2016  . Pedal edema 03/13/2016  . Osteoporosis 09/07/2015  . Pleural effusion 02/03/2015  . Rib fracture 02/02/2015  . Body mass index (BMI) of 23.0-23.9 in adult 12/29/2014  . Gastric ulcer 12/29/2014  . H/O transient cerebral ischemia 12/29/2014  . HLD (hyperlipidemia) 12/29/2014  . BP (high blood pressure) 12/29/2014  . Below normal amount of sodium in the blood 12/29/2014  . Awareness of heartbeats 12/29/2014  . Abnormal Pap smear of vagina 12/29/2014  . Plantar fasciitis 12/29/2014  . Esophageal reflux 12/29/2014  . 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 TI (tricuspid incompetence) 12/31/2013  . Chest pain 12/31/2013  . Chronic obstructive pulmonary disease (HCC)  12/31/2013  . Abnormal blood sugar 03/01/2009  . GAD (generalized anxiety disorder) 02/15/2009  . Current tobacco use 06/26/2006    Past Surgical History:  Procedure Laterality Date  . ABDOMINAL HYSTERECTOMY  1982    Her family history includes Aneurysm in her father; Diabetes in her father, maternal grandmother, and paternal  grandmother; Heart disease in her father; Leukemia in her mother.      Current Outpatient Medications:  .  albuterol (PROVENTIL HFA;VENTOLIN HFA) 108 (90 Base) MCG/ACT inhaler, Inhale 2 puffs into the lungs every 4 (four) hours as needed for wheezing or shortness of breath., Disp: 1 Inhaler, Rfl: 1 .  ALPRAZolam (XANAX) 0.5 MG tablet, Take 1 tablet (0.5 mg total) by mouth 2 (two) times daily as needed. for anxiety, Disp: 60 tablet, Rfl: 5 .  aspirin 81 MG tablet, Take 81 mg by mouth daily., Disp: , Rfl:  .  escitalopram (LEXAPRO) 10 MG tablet, Take 0.5 tab (5mg ) PO q hs x 1 week, then may increase to 1 tab (10mg ) x 1 week, then 1.5 tab (15mg ) x 1 week, then 2 tabs (20mg ) PO q hs., Disp: 60 tablet, Rfl: 0 .  Fluticasone-Salmeterol (ADVAIR) 250-50 MCG/DOSE AEPB, Inhale 1 puff into the lungs 2 (two) times daily., Disp: 14 each, Rfl: 1 .  lisinopril (PRINIVIL,ZESTRIL) 40 MG tablet, Take 40 mg by mouth daily. , Disp: , Rfl:  .  metoprolol succinate (TOPROL-XL) 50 MG 24 hr tablet, TAKE 1 TABLET(50 MG) BY MOUTH DAILY. FOLLOW UP, Disp: 90 tablet, Rfl: 1 .  pantoprazole (PROTONIX) 40 MG tablet, TAKE 1 TABLET(40 MG) BY MOUTH DAILY (Patient taking differently: TAKE 1 TABLET(40 MG) BY MOUTH DAILY (per patient she takes it PRN)), Disp: 30 tablet, Rfl: 1 .  simvastatin (ZOCOR) 10 MG tablet, Take 1 tablet (10 mg total) by mouth daily., Disp: 90 tablet, Rfl: 3 .  triamcinolone cream (KENALOG) 0.1 %, Apply 1 application topically 2 (two) times daily., Disp: 80 g, Rfl: 0 .  furosemide (LASIX) 20 MG tablet, TAKE 1 TABLET(20 MG) BY MOUTH DAILY (Patient not taking: Reported on 09/26/2017), Disp: 30 tablet, Rfl: 5 .  polyethylene glycol powder (GLYCOLAX/MIRALAX) powder, Put 1/2 container in 16 oz gatorade and drink over 1 hour, then put second half in a 16 oz gatorade and drink over hour for bowel clean (Patient not taking: Reported on 08/15/2017), Disp: 289 g, Rfl: 1 .  potassium chloride SA (K-DUR,KLOR-CON) 20 MEQ tablet,  Take 1 tablet (20 mEq total) by mouth daily. (Patient not taking: Reported on 09/26/2017), Disp: 90 tablet, Rfl: 1  Patient Care Team: Mar Daring, PA-C as PCP - General (Family Medicine)     Objective:   Vitals: BP 120/80 (BP Location: Left Arm, Patient Position: Sitting, Cuff Size: Normal)   Pulse 66   Temp 98.1 F (36.7 C) (Oral)   Resp 16   Ht 5\' 3"  (1.6 m)   Wt 123 lb (55.8 kg)   SpO2 95%   BMI 21.79 kg/m   Physical Exam  Constitutional: She is oriented to person, place, and time. She appears well-developed and well-nourished. No distress.  HENT:  Head: Normocephalic and atraumatic.  Right Ear: Hearing, tympanic membrane, external ear and ear canal normal.  Left Ear: Hearing, tympanic membrane, external ear and ear canal normal.  Nose: Nose normal.  Mouth/Throat: Uvula is midline, oropharynx is clear and moist and mucous membranes are normal. No oropharyngeal exudate.  Eyes: Conjunctivae and EOM are normal. Pupils are equal, round, and reactive  to light. Right eye exhibits no discharge. Left eye exhibits no discharge. No scleral icterus.  Neck: Normal range of motion. Neck supple. No JVD present. Carotid bruit is not present. No tracheal deviation present. No thyromegaly present.  Cardiovascular: Normal rate, regular rhythm and intact distal pulses. Exam reveals no gallop and no friction rub.  Murmur heard. Pulmonary/Chest: Effort normal. No respiratory distress. She has wheezes. She has rhonchi. She has no rales. She exhibits no tenderness.  Abdominal: Soft. Bowel sounds are normal. She exhibits no distension and no mass. There is no tenderness. There is no rebound and no guarding.  Musculoskeletal: Normal range of motion. She exhibits no edema or tenderness.  Lymphadenopathy:    She has no cervical adenopathy.  Neurological: She is alert and oriented to person, place, and time. She has normal reflexes.  Skin: Skin is warm and dry. No rash noted. She is not  diaphoretic.  Psychiatric: She has a normal mood and affect. Her behavior is normal. Judgment and thought content normal.  Vitals reviewed.   Activities of Daily Living In your present state of health, do you have any difficulty performing the following activities: 09/26/2017 05/20/2017  Hearing? Tempie Donning  Vision? Y Y  Difficulty concentrating or making decisions? Tempie Donning  Walking or climbing stairs? Y Y  Dressing or bathing? Y Y  Doing errands, shopping? N N  Some recent data might be hidden    Fall Risk Assessment Fall Risk  09/26/2017 05/20/2017 02/03/2017 08/28/2016 08/08/2015  Falls in the past year? No No No Yes Yes  Number falls in past yr: - - - 2 or more 1  Injury with Fall? - - - Yes Yes  Risk for fall due to : - - - - -     Depression Screen PHQ 2/9 Scores 09/26/2017 08/15/2017 06/23/2017 05/20/2017  PHQ - 2 Score 4 6 1 3   PHQ- 9 Score 18 19 8 15     Cognitive Testing - 6-CIT  Correct? Score   What year is it? yes 0 0 or 4  What month is it? yes 0 0 or 3  Memorize:    Pia Mau,  43,  [address],  [place],      What time is it? (within 1 hour) yes 0 0 or 3  Count backwards from 20 yes 0 0, 2, or 4  Name the months of the year yes 0 0, 2, or 4  Repeat name & address above no 1 0, 2, 4, 6, 8, or 10       TOTAL SCORE  1/28   Interpretation:  Normal  Normal (0-7) Abnormal (8-28)       Assessment & Plan:     Annual Wellness Visit  Reviewed patient's Family Medical History Reviewed and updated list of patient's medical providers Assessment of cognitive impairment was done Assessed patient's functional ability Established a written schedule for health screening [place] Completed and Reviewed  Exercise Activities and Dietary recommendations Goals    . Exercise 150 minutes per week (moderate activity)       Immunization History  Administered Date(s) Administered  . Influenza, High Dose Seasonal PF 05/23/2016, 05/20/2017  . Influenza,inj,Quad  PF,6+ Mos 08/08/2015  . Pneumococcal Conjugate-13 03/11/2014  . Pneumococcal Polysaccharide-23 06/26/2006, 05/23/2016  . Tdap 03/22/2011    Health Maintenance  Topic Date Due  . Hepatitis C Screening  March 28, 1946  . MAMMOGRAM  09/05/2017  . COLONOSCOPY  02/01/2018  . TETANUS/TDAP  03/21/2021  .  INFLUENZA VACCINE  Completed  . DEXA SCAN  Completed  . PNA vac Low Risk Adult  Completed     Discussed health benefits of physical activity, and encouraged her to engage in regular exercise appropriate for her age and condition.    1. Medicare annual wellness visit, subsequent Exam was abnormal due to findings of acute exacerbation of COPD and heart murmur noted. Up to date on screenings and vaccinations.  2. Breast cancer screening Breast exam today was normal. There is no family history of breast cancer. She does perform regular self breast exams. Advised to call [facility] for mammogram.  3. Need for hepatitis C screening test - Hepatitis C antibody  4. Hypokalemia Will check labs as below and f/u pending results. - Comprehensive metabolic panel  5. Essential hypertension Stable. Continue metoprolol and lisinopril. Will check labs as below and f/u pending results. - CBC with Differential/Platelet - Comprehensive metabolic panel - Hemoglobin A1c - Lipid panel - TSH  6. Pure hypercholesterolemia Stable. Continue Simvastatin. Will check labs as below and f/u pending results. - CBC with Differential/Platelet - Comprehensive metabolic panel - Hemoglobin A1c - Lipid panel - TSH  7. Abnormal blood sugar Diet controlled. Will check labs as below and f/u pending results. - CBC with Differential/Platelet - Comprehensive metabolic panel - Hemoglobin A1c - Lipid panel - TSH  8. Depression, major, single episode, mild (HCC) No improvement. Will change back to citalopram as below. She is to call for refills when needed and let me know dose she is on. - TSH - citalopram  (CELEXA) 20 MG tablet; Take 1 tablet (20 mg total) by mouth daily. May increase to 2 tabs if needed after 1 week  Dispense: 60 tablet; Refill: 0  9. Chronic obstructive pulmonary disease with acute exacerbation (HCC) Worsening symptoms that have not responded to OTC medications. Will give augmentin and prednisone as below. Continue other inhalers as prescribed. Stay well hydrated and get plenty of rest. Call if no symptom improvement or if symptoms worsen. - predniSONE (STERAPRED UNI-PAK 21 TAB) 10 MG (21) TBPK tablet; 6 day taper; take as directed on package instructions  Dispense: 21 tablet; Refill: 0 - amoxicillin-clavulanate (AUGMENTIN) 875-125 MG tablet; Take 1 tablet by mouth 2 (two) times daily.  Dispense: 20 tablet; Refill: 0 - Fluticasone-Salmeterol (ADVAIR) 250-50 MCG/DOSE AEPB; Inhale 1 puff into the lungs 2 (two) times daily.  Dispense: 14 each; Refill: 1 - albuterol (PROVENTIL HFA;VENTOLIN HFA) 108 (90 Base) MCG/ACT inhaler; Inhale 2 puffs into the lungs every 4 (four) hours as needed for wheezing or shortness of breath.  Dispense: 1 Inhaler; Refill: 1 - Tiotropium Bromide Monohydrate (SPIRIVA RESPIMAT) 1.25 MCG/ACT AERS; Inhale 1 puff into the lungs daily.  Dispense: 4 g; Refill: 5  10. Colon cancer screening Last colonoscopy was 2015 and to repeat in 4 years. Patient agrees to referral.  - Ambulatory referral to Gastroenterology  11. Current tobacco use Does not desire to quit at this time.   12. Chronic congestive heart failure, unspecified heart failure type ([place]) Will check labs as below and f/u pending results. - B Nat Peptide  ------------------------------------------------------------------------------------------------------------    Mar Daring, PA-C  [facility]

## 2017-09-27 LAB — LIPID PANEL
Chol/HDL Ratio: 3.6 ratio (ref 0.0–4.4)
Cholesterol, Total: 192 mg/dL (ref 100–199)
HDL: 53 mg/dL (ref >39)
LDL Calculated: 106 mg/dL — ABNORMAL HIGH (ref 0–99)
Triglycerides: 164 mg/dL — ABNORMAL HIGH (ref 0–149)
VLDL Cholesterol Cal: 33 mg/dL (ref 5–40)

## 2017-09-27 LAB — CBC WITH DIFFERENTIAL/PLATELET
BASOS: 1 %
Basophils Absolute: 0 10*3/uL (ref 0.0–0.2)
EOS (ABSOLUTE): 0.2 10*3/uL (ref 0.0–0.4)
EOS: 3 %
HEMATOCRIT: 40 % (ref 34.0–46.6)
HEMOGLOBIN: 13.2 g/dL (ref 11.1–15.9)
IMMATURE GRANS (ABS): 0 10*3/uL (ref 0.0–0.1)
IMMATURE GRANULOCYTES: 0 %
LYMPHS: 30 %
Lymphocytes Absolute: 2 10*3/uL (ref 0.7–3.1)
MCH: 32 pg (ref 26.6–33.0)
MCHC: 33 g/dL (ref 31.5–35.7)
MCV: 97 fL (ref 79–97)
Monocytes Absolute: 0.4 10*3/uL (ref 0.1–0.9)
Monocytes: 6 %
NEUTROS ABS: 3.9 10*3/uL (ref 1.4–7.0)
NEUTROS PCT: 60 %
Platelets: 270 10*3/uL (ref 150–379)
RBC: 4.13 x10E6/uL (ref 3.77–5.28)
RDW: 13.3 % (ref 12.3–15.4)
WBC: 6.6 10*3/uL (ref 3.4–10.8)

## 2017-09-27 LAB — COMPREHENSIVE METABOLIC PANEL
ALK PHOS: 109 IU/L (ref 39–117)
ALT: 7 IU/L (ref 0–32)
AST: 16 IU/L (ref 0–40)
Albumin/Globulin Ratio: 1.4 (ref 1.2–2.2)
Albumin: 4.1 g/dL (ref 3.5–4.8)
BUN/Creatinine Ratio: 10 — ABNORMAL LOW (ref 12–28)
BUN: 9 mg/dL (ref 8–27)
Bilirubin Total: 0.4 mg/dL (ref 0.0–1.2)
CO2: 25 mmol/L (ref 20–29)
CREATININE: 0.92 mg/dL (ref 0.57–1.00)
Calcium: 8.9 mg/dL (ref 8.7–10.3)
Chloride: 93 mmol/L — ABNORMAL LOW (ref 96–106)
GFR calc Af Amer: 72 mL/min/{1.73_m2} (ref >59)
GFR calc non Af Amer: 63 mL/min/{1.73_m2} (ref >59)
GLOBULIN, TOTAL: 3 g/dL (ref 1.5–4.5)
GLUCOSE: 88 mg/dL (ref 65–99)
POTASSIUM: 3.2 mmol/L — AB (ref 3.5–5.2)
SODIUM: 135 mmol/L (ref 134–144)
Total Protein: 7.1 g/dL (ref 6.0–8.5)

## 2017-09-27 LAB — HEMOGLOBIN A1C
ESTIMATED AVERAGE GLUCOSE: 111 mg/dL
HEMOGLOBIN A1C: 5.5 % (ref 4.8–5.6)

## 2017-09-27 LAB — BRAIN NATRIURETIC PEPTIDE: BNP: 620.2 pg/mL — ABNORMAL HIGH (ref 0.0–100.0)

## 2017-09-27 LAB — HEPATITIS C ANTIBODY

## 2017-09-27 LAB — TSH: TSH: 1.63 u[IU]/mL (ref 0.450–4.500)

## 2017-10-01 MED ORDER — FUROSEMIDE 20 MG PO TABS: ORAL_TABLET | ORAL | 1 refills | Status: DC

## 2017-10-01 MED ORDER — POTASSIUM CHLORIDE CRYS ER 20 MEQ PO TBCR: 20.0000 meq | EXTENDED_RELEASE_TABLET | Freq: Every day | ORAL | 1 refills | Status: DC

## 2017-10-01 NOTE — Addendum Note (Signed)
Addended by: Mar Daring on: 10/01/2017 02:12 PM   Modules accepted: Orders

## 2017-10-02 ENCOUNTER — Telehealth: Payer: Self-pay

## 2017-10-02 DIAGNOSIS — B379 Candidiasis, unspecified: Secondary | ICD-10-CM

## 2017-10-02 DIAGNOSIS — T3695XA Adverse effect of unspecified systemic antibiotic, initial encounter: Principal | ICD-10-CM

## 2017-10-02 NOTE — Telephone Encounter (Signed)
-----   Message from Mar Daring, [place] sent at 10/01/2017  2:09 PM EST ----- Lab for your heart failure is up slightly. Make sure to take your lisinopril and metoprolol daily. May benefit with lasix 20mg  daily, instead of as needed. With taking it daily we will have to add a daily potassium supplement, which I feel you need anyway since potassium is still borderline low. All other labs are unremarkable and normal.

## 2017-10-02 NOTE — Telephone Encounter (Signed)
LMTCB  Thanks,  -Shamica Moree 

## 2017-10-06 MED ORDER — FLUCONAZOLE 150 MG PO TABS: 150.0000 mg | ORAL_TABLET | Freq: Once | ORAL | 0 refills | Status: AC

## 2017-10-06 NOTE — Telephone Encounter (Signed)
Left detailed message on pt's vm advising rx was sent to pharmacy.

## 2017-10-06 NOTE — Telephone Encounter (Signed)
Sent in diflucan

## 2017-10-06 NOTE — Telephone Encounter (Signed)
1. Pt returned call about results.  2. Pt stated the antibiotic she was taking has caused a yeast infection. Pt is requesting treatment in pill form be sent to [facility]. Please advise. Thanks TNP

## 2017-10-06 NOTE — Telephone Encounter (Signed)
Patient advised of labs results. Patient is also requesting a treatment for yeast infection.  Pharm[facility]: [facility].

## 2017-10-17 ENCOUNTER — Telehealth: Payer: Self-pay

## 2017-10-17 NOTE — Telephone Encounter (Signed)
Agreed.  Thank you

## 2017-10-17 NOTE — Telephone Encounter (Signed)
Pt is c/o left sided low back pain radiating to abdomen, dysuria, nausea and vomiting, decreased appetite x 2 days. She also states she was constipated Saturday, and noticed bright red blood on the toilet paper after wiping. Her temperature has been over 100 degrees, and she is experiencing chills and sweats. She is also experiencing dizziness, and states she can not keep fluids down. She is concerned of UTI/pyelonephritis. I advised pt to go to ER or urgent care for possible IV fluids and to check for UTI.

## 2017-10-18 ENCOUNTER — Emergency Department: Payer: Medicare Other

## 2017-10-18 ENCOUNTER — Other Ambulatory Visit: Payer: Self-pay

## 2017-10-18 ENCOUNTER — Encounter: Payer: Self-pay | Admitting: Emergency Medicine

## 2017-10-18 ENCOUNTER — Inpatient Hospital Stay
Admission: EM | Admit: 2017-10-18 | Discharge: 2017-10-25 | DRG: 982 | Disposition: A | Discharge status: Home Health | Payer: Medicare Other | Attending: Internal Medicine | Admitting: Internal Medicine

## 2017-10-18 DIAGNOSIS — R7989 Other specified abnormal findings of blood chemistry: Secondary | ICD-10-CM

## 2017-10-18 DIAGNOSIS — R1031 Right lower quadrant pain: Secondary | ICD-10-CM | POA: Diagnosis not present

## 2017-10-18 DIAGNOSIS — N281 Cyst of kidney, acquired: Secondary | ICD-10-CM | POA: Diagnosis present

## 2017-10-18 DIAGNOSIS — F17210 Nicotine dependence, cigarettes, uncomplicated: Secondary | ICD-10-CM | POA: Diagnosis present

## 2017-10-18 DIAGNOSIS — R0902 Hypoxemia: Secondary | ICD-10-CM | POA: Diagnosis present

## 2017-10-18 DIAGNOSIS — M48061 Spinal stenosis, lumbar region without neurogenic claudication: Secondary | ICD-10-CM | POA: Diagnosis present

## 2017-10-18 DIAGNOSIS — E860 Dehydration: Secondary | ICD-10-CM | POA: Diagnosis present

## 2017-10-18 DIAGNOSIS — M810 Age-related osteoporosis without current pathological fracture: Secondary | ICD-10-CM | POA: Diagnosis not present

## 2017-10-18 DIAGNOSIS — Z8673 Personal history of transient ischemic attack (TIA), and cerebral infarction without residual deficits: Secondary | ICD-10-CM | POA: Diagnosis not present

## 2017-10-18 DIAGNOSIS — I959 Hypotension, unspecified: Secondary | ICD-10-CM | POA: Diagnosis not present

## 2017-10-18 DIAGNOSIS — I509 Heart failure, unspecified: Secondary | ICD-10-CM | POA: Diagnosis not present

## 2017-10-18 DIAGNOSIS — I248 Other forms of acute ischemic heart disease: Secondary | ICD-10-CM | POA: Diagnosis not present

## 2017-10-18 DIAGNOSIS — M4856XA Collapsed vertebra, not elsewhere classified, lumbar region, initial encounter for fracture: Secondary | ICD-10-CM | POA: Diagnosis present

## 2017-10-18 DIAGNOSIS — M549 Dorsalgia, unspecified: Secondary | ICD-10-CM | POA: Diagnosis not present

## 2017-10-18 DIAGNOSIS — E785 Hyperlipidemia, unspecified: Secondary | ICD-10-CM | POA: Diagnosis not present

## 2017-10-18 DIAGNOSIS — E7800 Pure hypercholesterolemia, unspecified: Secondary | ICD-10-CM | POA: Diagnosis not present

## 2017-10-18 DIAGNOSIS — Z8249 Family history of ischemic heart disease and other diseases of the circulatory system: Secondary | ICD-10-CM

## 2017-10-18 DIAGNOSIS — R05 Cough: Secondary | ICD-10-CM | POA: Diagnosis not present

## 2017-10-18 DIAGNOSIS — R109 Unspecified abdominal pain: Secondary | ICD-10-CM

## 2017-10-18 DIAGNOSIS — K219 Gastro-esophageal reflux disease without esophagitis: Secondary | ICD-10-CM | POA: Diagnosis not present

## 2017-10-18 DIAGNOSIS — I110 Hypertensive heart disease with heart failure: Secondary | ICD-10-CM | POA: Diagnosis not present

## 2017-10-18 DIAGNOSIS — Z833 Family history of diabetes mellitus: Secondary | ICD-10-CM | POA: Diagnosis not present

## 2017-10-18 DIAGNOSIS — J439 Emphysema, unspecified: Secondary | ICD-10-CM | POA: Diagnosis present

## 2017-10-18 DIAGNOSIS — Z9981 Dependence on supplemental oxygen: Secondary | ICD-10-CM

## 2017-10-18 DIAGNOSIS — I081 Rheumatic disorders of both mitral and tricuspid valves: Secondary | ICD-10-CM | POA: Diagnosis not present

## 2017-10-18 DIAGNOSIS — R748 Abnormal levels of other serum enzymes: Secondary | ICD-10-CM | POA: Diagnosis not present

## 2017-10-18 DIAGNOSIS — I712 Thoracic aortic aneurysm, without rupture: Secondary | ICD-10-CM | POA: Diagnosis present

## 2017-10-18 DIAGNOSIS — F329 Major depressive disorder, single episode, unspecified: Secondary | ICD-10-CM | POA: Diagnosis present

## 2017-10-18 DIAGNOSIS — S22088A Other fracture of T11-T12 vertebra, initial encounter for closed fracture: Secondary | ICD-10-CM | POA: Diagnosis not present

## 2017-10-18 DIAGNOSIS — I2510 Atherosclerotic heart disease of native coronary artery without angina pectoris: Secondary | ICD-10-CM | POA: Diagnosis not present

## 2017-10-18 DIAGNOSIS — E876 Hypokalemia: Secondary | ICD-10-CM | POA: Diagnosis not present

## 2017-10-18 DIAGNOSIS — Z90710 Acquired absence of both cervix and uterus: Secondary | ICD-10-CM | POA: Diagnosis not present

## 2017-10-18 DIAGNOSIS — J449 Chronic obstructive pulmonary disease, unspecified: Secondary | ICD-10-CM | POA: Diagnosis not present

## 2017-10-18 DIAGNOSIS — K5900 Constipation, unspecified: Secondary | ICD-10-CM | POA: Diagnosis present

## 2017-10-18 DIAGNOSIS — R778 Other specified abnormalities of plasma proteins: Secondary | ICD-10-CM

## 2017-10-18 DIAGNOSIS — S32018A Other fracture of first lumbar vertebra, initial encounter for closed fracture: Secondary | ICD-10-CM | POA: Diagnosis not present

## 2017-10-18 DIAGNOSIS — F419 Anxiety disorder, unspecified: Secondary | ICD-10-CM | POA: Diagnosis not present

## 2017-10-18 DIAGNOSIS — Z419 Encounter for procedure for purposes other than remedying health state, unspecified: Secondary | ICD-10-CM

## 2017-10-18 DIAGNOSIS — M4850XA Collapsed vertebra, not elsewhere classified, site unspecified, initial encounter for fracture: Secondary | ICD-10-CM

## 2017-10-18 DIAGNOSIS — R9431 Abnormal electrocardiogram [ECG] [EKG]: Secondary | ICD-10-CM

## 2017-10-18 DIAGNOSIS — Z9181 History of falling: Secondary | ICD-10-CM

## 2017-10-18 DIAGNOSIS — K5730 Diverticulosis of large intestine without perforation or abscess without bleeding: Secondary | ICD-10-CM | POA: Diagnosis present

## 2017-10-18 DIAGNOSIS — Z79899 Other long term (current) drug therapy: Secondary | ICD-10-CM | POA: Diagnosis not present

## 2017-10-18 DIAGNOSIS — I5020 Unspecified systolic (congestive) heart failure: Secondary | ICD-10-CM

## 2017-10-18 DIAGNOSIS — Z806 Family history of leukemia: Secondary | ICD-10-CM

## 2017-10-18 DIAGNOSIS — Z981 Arthrodesis status: Secondary | ICD-10-CM | POA: Diagnosis not present

## 2017-10-18 DIAGNOSIS — I5022 Chronic systolic (congestive) heart failure: Secondary | ICD-10-CM | POA: Diagnosis not present

## 2017-10-18 DIAGNOSIS — S32010A Wedge compression fracture of first lumbar vertebra, initial encounter for closed fracture: Secondary | ICD-10-CM | POA: Diagnosis not present

## 2017-10-18 DIAGNOSIS — R0602 Shortness of breath: Secondary | ICD-10-CM | POA: Diagnosis not present

## 2017-10-18 DIAGNOSIS — M545 Low back pain: Secondary | ICD-10-CM | POA: Diagnosis not present

## 2017-10-18 LAB — COMPREHENSIVE METABOLIC PANEL
ALT: 11 U/L — ABNORMAL LOW (ref 14–54)
AST: 27 U/L (ref 15–41)
Albumin: 4.3 g/dL (ref 3.5–5.0)
Alkaline Phosphatase: 99 U/L (ref 38–126)
Anion gap: 19 — ABNORMAL HIGH (ref 5–15)
BUN: 22 mg/dL — ABNORMAL HIGH (ref 6–20)
CHLORIDE: 88 mmol/L — AB (ref 101–111)
CO2: 30 mmol/L (ref 22–32)
Calcium: 9.3 mg/dL (ref 8.9–10.3)
Creatinine, Ser: 1.2 mg/dL — ABNORMAL HIGH (ref 0.44–1.00)
GFR, EST AFRICAN AMERICAN: 51 mL/min — AB (ref >60)
GFR, EST NON AFRICAN AMERICAN: 44 mL/min — AB (ref >60)
Glucose, Bld: 130 mg/dL — ABNORMAL HIGH (ref 65–99)
POTASSIUM: 2.5 mmol/L — AB (ref 3.5–5.1)
SODIUM: 137 mmol/L (ref 135–145)
Total Bilirubin: 1.1 mg/dL (ref 0.3–1.2)
Total Protein: 8.9 g/dL — ABNORMAL HIGH (ref 6.5–8.1)

## 2017-10-18 LAB — DIFFERENTIAL
Basophils Absolute: 0.1 10*3/uL (ref 0–0.1)
Basophils Relative: 1 %
Eosinophils Absolute: 0.1 10*3/uL (ref 0–0.7)
Eosinophils Relative: 1 %
LYMPHS ABS: 1 10*3/uL (ref 1.0–3.6)
LYMPHS PCT: 11 %
MONOS PCT: 6 %
Monocytes Absolute: 0.5 10*3/uL (ref 0.2–0.9)
NEUTROS ABS: 7.5 10*3/uL — AB (ref 1.4–6.5)
Neutrophils Relative %: 81 %

## 2017-10-18 LAB — CBC
HCT: 46.1 % (ref 35.0–47.0)
Hemoglobin: 15.3 g/dL (ref 12.0–16.0)
MCH: 32.1 pg (ref 26.0–34.0)
MCHC: 33.3 g/dL (ref 32.0–36.0)
MCV: 96.4 fL (ref 80.0–100.0)
PLATELETS: 286 10*3/uL (ref 150–440)
RBC: 4.78 MIL/uL (ref 3.80–5.20)
RDW: 13.5 % (ref 11.5–14.5)
WBC: 9.3 10*3/uL (ref 3.6–11.0)

## 2017-10-18 LAB — TROPONIN I
Troponin I: 0.06 ng/mL (ref <0.03)
Troponin I: 0.06 ng/mL (ref <0.03)
Troponin I: 0.07 ng/mL (ref <0.03)

## 2017-10-18 LAB — FIBRIN DERIVATIVES D-DIMER (ARMC ONLY): FIBRIN DERIVATIVES D-DIMER (ARMC): 1339.48 ng{FEU}/mL — AB (ref 0.00–499.00)

## 2017-10-18 LAB — BRAIN NATRIURETIC PEPTIDE: B Natriuretic Peptide: 876 pg/mL — ABNORMAL HIGH (ref 0.0–100.0)

## 2017-10-18 MED ORDER — HYDROCODONE-ACETAMINOPHEN 5-325 MG PO TABS
1.0000 | ORAL_TABLET | ORAL | Status: DC | PRN
  Administered 2017-10-18: 2 via ORAL
  Administered 2017-10-19 – 2017-10-20 (×4): 1 via ORAL
  Administered 2017-10-20 (×2): 2 via ORAL
  Administered 2017-10-21 – 2017-10-24 (×8): 1 via ORAL
  Filled 2017-10-18 (×2): qty 1
  Filled 2017-10-18: qty 2
  Filled 2017-10-18 (×9): qty 1
  Filled 2017-10-18: qty 2
  Filled 2017-10-18: qty 1
  Filled 2017-10-18: qty 2

## 2017-10-18 MED ORDER — POTASSIUM CHLORIDE IN NACL 20-0.9 MEQ/L-% IV SOLN
INTRAVENOUS | Status: DC
  Administered 2017-10-18 – 2017-10-19 (×2): via INTRAVENOUS
  Filled 2017-10-18 (×3): qty 1000

## 2017-10-18 MED ORDER — LISINOPRIL 20 MG PO TABS
40.0000 mg | ORAL_TABLET | Freq: Every day | ORAL | Status: DC
  Filled 2017-10-18: qty 2

## 2017-10-18 MED ORDER — SODIUM CHLORIDE 0.9 % IV BOLUS (SEPSIS)
1000.0000 mL | Freq: Once | INTRAVENOUS | Status: AC
  Administered 2017-10-18: 1000 mL via INTRAVENOUS

## 2017-10-18 MED ORDER — POTASSIUM CHLORIDE CRYS ER 20 MEQ PO TBCR
40.0000 meq | EXTENDED_RELEASE_TABLET | Freq: Once | ORAL | Status: AC
  Administered 2017-10-18: 40 meq via ORAL

## 2017-10-18 MED ORDER — IOPAMIDOL (ISOVUE-370) INJECTION 76%
60.0000 mL | Freq: Once | INTRAVENOUS | Status: AC | PRN
  Administered 2017-10-18: 60 mL via INTRAVENOUS

## 2017-10-18 MED ORDER — ACETAMINOPHEN 325 MG PO TABS
650.0000 mg | ORAL_TABLET | Freq: Four times a day (QID) | ORAL | Status: DC | PRN
  Administered 2017-10-19 – 2017-10-21 (×2): 650 mg via ORAL
  Filled 2017-10-18 (×2): qty 2

## 2017-10-18 MED ORDER — ONDANSETRON HCL 4 MG/2ML IJ SOLN
4.0000 mg | Freq: Four times a day (QID) | INTRAMUSCULAR | Status: DC | PRN
  Administered 2017-10-20 – 2017-10-22 (×2): 4 mg via INTRAVENOUS
  Filled 2017-10-18 (×2): qty 2

## 2017-10-18 MED ORDER — MORPHINE SULFATE (PF) 4 MG/ML IV SOLN
4.0000 mg | Freq: Once | INTRAVENOUS | Status: AC
  Administered 2017-10-18: 4 mg via INTRAVENOUS
  Filled 2017-10-18: qty 1

## 2017-10-18 MED ORDER — ALPRAZOLAM 0.25 MG PO TABS
0.2500 mg | ORAL_TABLET | Freq: Two times a day (BID) | ORAL | Status: DC | PRN
  Administered 2017-10-19 – 2017-10-24 (×5): 0.25 mg via ORAL
  Filled 2017-10-18 (×5): qty 1

## 2017-10-18 MED ORDER — ASPIRIN 81 MG PO CHEW
324.0000 mg | CHEWABLE_TABLET | Freq: Once | ORAL | Status: AC
  Administered 2017-10-18: 324 mg via ORAL
  Filled 2017-10-18: qty 4

## 2017-10-18 MED ORDER — ASPIRIN EC 81 MG PO TBEC
81.0000 mg | DELAYED_RELEASE_TABLET | Freq: Every day | ORAL | Status: DC
  Administered 2017-10-19 – 2017-10-25 (×6): 81 mg via ORAL
  Filled 2017-10-18 (×5): qty 1

## 2017-10-18 MED ORDER — ONDANSETRON HCL 4 MG/2ML IJ SOLN
4.0000 mg | Freq: Once | INTRAMUSCULAR | Status: AC
  Administered 2017-10-18: 4 mg via INTRAVENOUS
  Filled 2017-10-18: qty 2

## 2017-10-18 MED ORDER — MORPHINE SULFATE (PF) 4 MG/ML IV SOLN
INTRAVENOUS | Status: AC
  Administered 2017-10-18: 4 mg via INTRAVENOUS
  Filled 2017-10-18: qty 1

## 2017-10-18 MED ORDER — SIMVASTATIN 20 MG PO TABS
10.0000 mg | ORAL_TABLET | Freq: Every day | ORAL | Status: DC
  Administered 2017-10-18 – 2017-10-25 (×8): 10 mg via ORAL
  Filled 2017-10-18 (×8): qty 1

## 2017-10-18 MED ORDER — PANTOPRAZOLE SODIUM 40 MG PO TBEC
40.0000 mg | DELAYED_RELEASE_TABLET | Freq: Every day | ORAL | Status: DC
  Administered 2017-10-19 – 2017-10-25 (×7): 40 mg via ORAL
  Filled 2017-10-18 (×7): qty 1

## 2017-10-18 MED ORDER — ONDANSETRON HCL 4 MG PO TABS: 4.0000 mg | ORAL_TABLET | Freq: Four times a day (QID) | ORAL | Status: DC | PRN

## 2017-10-18 MED ORDER — POTASSIUM CHLORIDE CRYS ER 20 MEQ PO TBCR
EXTENDED_RELEASE_TABLET | ORAL | Status: AC
  Administered 2017-10-18: 40 meq via ORAL
  Filled 2017-10-18: qty 2

## 2017-10-18 MED ORDER — SENNOSIDES-DOCUSATE SODIUM 8.6-50 MG PO TABS: 1.0000 | ORAL_TABLET | Freq: Every evening | ORAL | Status: DC | PRN

## 2017-10-18 MED ORDER — MOMETASONE FURO-FORMOTEROL FUM 200-5 MCG/ACT IN AERO
2.0000 | INHALATION_SPRAY | Freq: Two times a day (BID) | RESPIRATORY_TRACT | Status: DC
  Administered 2017-10-18 – 2017-10-25 (×14): 2 via RESPIRATORY_TRACT
  Filled 2017-10-18: qty 8.8

## 2017-10-18 MED ORDER — ACETAMINOPHEN 650 MG RE SUPP: 650.0000 mg | Freq: Four times a day (QID) | RECTAL | Status: DC | PRN

## 2017-10-18 MED ORDER — ALBUTEROL SULFATE (2.5 MG/3ML) 0.083% IN NEBU
3.0000 mL | INHALATION_SOLUTION | RESPIRATORY_TRACT | Status: DC | PRN
Start: 2017-10-18 — End: 2017-10-25
  Administered 2017-10-19: 3 mL via RESPIRATORY_TRACT
  Filled 2017-10-18: qty 3

## 2017-10-18 MED ORDER — CITALOPRAM HYDROBROMIDE 20 MG PO TABS
20.0000 mg | ORAL_TABLET | Freq: Every day | ORAL | Status: DC
  Administered 2017-10-18 – 2017-10-24 (×7): 20 mg via ORAL
  Filled 2017-10-18 (×7): qty 1

## 2017-10-18 MED ORDER — ENOXAPARIN SODIUM 40 MG/0.4ML ~~LOC~~ SOLN
40.0000 mg | SUBCUTANEOUS | Status: DC
  Administered 2017-10-18: 40 mg via SUBCUTANEOUS
  Filled 2017-10-18: qty 0.4

## 2017-10-18 MED ORDER — METOPROLOL SUCCINATE ER 50 MG PO TB24: 50.0000 mg | ORAL_TABLET | Freq: Every evening | ORAL | Status: DC

## 2017-10-18 MED ORDER — TIOTROPIUM BROMIDE MONOHYDRATE 18 MCG IN CAPS
1.0000 | ORAL_CAPSULE | Freq: Every day | RESPIRATORY_TRACT | Status: DC
  Administered 2017-10-18 – 2017-10-25 (×8): 18 ug via RESPIRATORY_TRACT
  Filled 2017-10-18 (×2): qty 5

## 2017-10-18 MED ORDER — MORPHINE SULFATE (PF) 4 MG/ML IV SOLN
4.0000 mg | Freq: Once | INTRAVENOUS | Status: AC
  Administered 2017-10-18: 4 mg via INTRAVENOUS

## 2017-10-18 NOTE — ED Notes (Signed)
Pt's oxygen 70% with a positive wave form. Pt appears non distressed. Pt placed on 4 L until a rise in oxygen of 96%. Oxygen decreased to 2L. MD made aware.

## 2017-10-18 NOTE — ED Triage Notes (Signed)
L flank pain x 5 days.

## 2017-10-18 NOTE — ED Provider Notes (Signed)
[facility] Emergency Department Provider Note   ____________________________________________   First MD Initiated Contact with Patient 10/18/17 [phone]     (approximate)  I have reviewed the triage vital signs and the nursing notes.   HISTORY  Chief Complaint Flank Pain    HPI Natassia Guthridge is a 72 y.o. female Who reports a cough for several days which is productive of very small amounts of phlegm. It is not discolored. She also has left flank pain has been going on for 5 days and radiates down around to the belly and into the groin. She has some dysuria. Pain is worse with deep breathing with percussion of the left flank and when she hits bumps in the road. The pain is moderately severe. Achy in nature. She has a low-grade fever up to 100 and is back down again.she has not been hungry and has not been eating or drinking much.   Past Medical History:  Diagnosis Date  . Anxiety   . CAD (coronary artery disease) unk  . COPD (chronic obstructive pulmonary disease) ([place])   . Depression   . Hypercholesteremia unk  . Hypertension     Patient Active Problem List   Diagnosis Date Noted  . Hypokalemia 10/18/2017  . Chronic congestive heart failure ([place]) 09/26/2017  . Alcohol abuse 08/28/2016  . Pedal edema 03/13/2016  . Osteoporosis 09/07/2015  . Pleural effusion 02/03/2015  . Rib fracture 02/02/2015  . Body mass index (BMI) of 23.0-23.9 in adult 12/29/2014  . Gastric ulcer 12/29/2014  . H/O transient cerebral ischemia 12/29/2014  . HLD (hyperlipidemia) 12/29/2014  . BP (high blood pressure) 12/29/2014  . Below normal amount of sodium in the blood 12/29/2014  . Awareness of heartbeats 12/29/2014  . Abnormal Pap smear of vagina 12/29/2014  . Plantar fasciitis 12/29/2014  . Esophageal reflux 12/29/2014  . 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 TI (tricuspid incompetence) 12/31/2013  . Chest pain 12/31/2013  . Chronic obstructive pulmonary disease  ([place]) 12/31/2013  . Abnormal blood sugar 03/01/2009  . GAD (generalized anxiety disorder) 02/15/2009  . Current tobacco use 06/26/2006    Past Surgical History:  Procedure Laterality Date  . ABDOMINAL HYSTERECTOMY  1982    Prior to Admission medications   Medication Sig Start Date End Date Taking? Authorizing Provider  albuterol (PROVENTIL HFA;VENTOLIN HFA) 108 (90 Base) MCG/ACT inhaler Inhale 2 puffs into the lungs every 4 (four) hours as needed for wheezing or shortness of breath. 09/26/17  Yes Mar Daring, PA-C  ALPRAZolam Duanne Moron) 0.5 MG tablet Take 1 tablet (0.5 mg total) by mouth 2 (two) times daily as needed. for anxiety 06/23/17  Yes Rubye Beach  aspirin 81 MG tablet Take 81 mg by mouth daily.   Yes [provider]  citalopram (CELEXA) 20 MG tablet Take 1 tablet (20 mg total) by mouth daily. May increase to 2 tabs if needed after 1 week 09/26/17  Yes Burnette, Clearnce Sorrel, PA-C  Fluticasone-Salmeterol (ADVAIR) 250-50 MCG/DOSE AEPB Inhale 1 puff into the lungs 2 (two) times daily. 09/26/17  Yes Mar Daring, PA-C  furosemide (LASIX) 20 MG tablet Take 20 mg by mouth daily.   Yes [provider]  lisinopril (PRINIVIL,ZESTRIL) 40 MG tablet Take 40 mg by mouth daily.  11/24/13  Yes [provider]  metoprolol succinate (TOPROL-XL) 50 MG 24 hr tablet Take 50 mg by mouth every evening. Take with or immediately following a meal.   Yes [provider]  pantoprazole (  PROTONIX) 40 MG tablet Take 40 mg by mouth daily as needed.   Yes [provider]  potassium chloride SA (K-DUR,KLOR-CON) 20 MEQ tablet Take 1 tablet (20 mEq total) by mouth daily. 10/01/17  Yes Mar Daring, PA-C  simvastatin (ZOCOR) 10 MG tablet Take 1 tablet (10 mg total) by mouth daily. 03/24/17  Yes Mar Daring, PA-C  Tiotropium Bromide Monohydrate (SPIRIVA RESPIMAT) 1.25 MCG/ACT AERS Inhale 1 puff into the lungs daily. 09/26/17  Yes Mar Daring, PA-C  amoxicillin-clavulanate (AUGMENTIN) 875-125 MG tablet Take 1 tablet by mouth 2 (two) times daily. Patient not taking: Reported on 10/18/2017 09/26/17   Mar Daring, PA-C  polyethylene glycol powder (GLYCOLAX/MIRALAX) powder Put 1/2 container in 16 oz gatorade and drink over 1 hour, then put second half in a 16 oz gatorade and drink over hour for bowel clean Patient not taking: Reported on 08/15/2017 06/23/17   Mar Daring, PA-C  predniSONE (STERAPRED UNI-PAK 21 TAB) 10 MG (21) TBPK tablet 6 day taper; take as directed on package instructions Patient not taking: Reported on 10/18/2017 09/26/17   Mar Daring, PA-C  triamcinolone cream (KENALOG) 0.1 % Apply 1 application topically 2 (two) times daily. Patient not taking: Reported on 10/18/2017 12/02/16   Mar Daring, PA-C    Allergies Patient has no known allergies.  Family History  Problem Relation Age of Onset  . Leukemia Mother   . Aneurysm Father   . Diabetes Father   . Heart disease Father   . Diabetes Maternal Grandmother   . Diabetes Paternal Grandmother     Social History Social History   Tobacco Use  . Smoking status: Current Every Day Smoker    Packs/day: 0.50    Years: 50.00    Pack years: 25.00  . Smokeless tobacco: Never Used  Substance Use Topics  . Alcohol use: Yes    Alcohol/week: 4.2 oz    Types: 7 Glasses of wine per week    Comment: 1-2 glasses wine daily  . Drug use: No    Review of Systems  Constitutional: No fever/chills Eyes: No visual changes. ENT: No sore throat. Cardiovascular: Denies chest pain. Respiratory: Denies shortness of breath. Gastrointestinal: No abdominal pain.   nausea, no vomiting.  No diarrhea.  No constipation. Genitourinary: dysuria. Musculoskeletal: Negative for back pain. Skin: Negative for rash. Neurological: Negative for headaches, focal weakness   ____________________________________________   PHYSICAL EXAM:  VITAL  SIGNS: ED Triage Vitals  Enc Vitals Group     BP 10/18/17 0943 (!) 167/91     Pulse Rate 10/18/17 0943 75     Resp 10/18/17 0943 18     Temp 10/18/17 0943 98.4 F (36.9 C)     Temp Source 10/18/17 0943 Oral     SpO2 10/18/17 0943 92 %     Weight 10/18/17 0947 123 lb (55.8 kg)     Height 10/18/17 0947 5\' 3"  (1.6 m)     Head Circumference --      Peak Flow --      Pain Score 10/18/17 0947 10     Pain Loc --      Pain Edu? --      Excl. in [place]? --     Constitutional: Alert and oriented. Well appearing and in no acute distress. Eyes: Conjunctivae are normal.  Head: Atraumatic. Nose: No congestion/rhinnorhea. Mouth/Throat: Mucous membranes are moist.  Oropharynx non-erythematous. Neck: No stridor. Cardiovascular: Normal rate, regular rhythm. Grossly normal heart  sounds.  Good peripheral circulation. Respiratory: Normal respiratory effort.  No retractions. Lungs CTAB. Gastrointestinal: Soft some tenderness on the left side to palpation.. No distention. No abdominal bruits. leftCVA tenderness. Musculoskeletal: No lower extremity tenderness nor edema.  No joint effusions. Neurologic:  Normal speech and language. No gross focal neurologic deficits are appreciated.  Skin:  Skin is warm, dry and intact. No rash noted. Psychiatric: Mood and affect are normal. Speech and behavior are normal.  ____________________________________________   LABS (all labs ordered are listed, but only abnormal results are displayed)  Labs Reviewed  COMPREHENSIVE METABOLIC PANEL - Abnormal; Notable for the following components:      Result Value   Potassium 2.5 (*)    Chloride 88 (*)    Glucose, Bld 130 (*)    BUN 22 (*)    Creatinine, Ser 1.20 (*)    Total Protein 8.9 (*)    ALT 11 (*)    GFR calc non Af Amer 44 (*)    GFR calc Af Amer 51 (*)    Anion gap 19 (*)    All other components within normal limits  BRAIN NATRIURETIC PEPTIDE - Abnormal; Notable for the following components:   B  Natriuretic Peptide 876.0 (*)    All other components within normal limits  TROPONIN I - Abnormal; Notable for the following components:   Troponin I 0.06 (*)    All other components within normal limits  FIBRIN DERIVATIVES D-DIMER ([facility] ONLY) - Abnormal; Notable for the following components:   Fibrin derivatives D-dimer [facility]) 1,339.48 (*)    All other components within normal limits  DIFFERENTIAL - Abnormal; Notable for the following components:   Neutro Abs 7.5 (*)    All other components within normal limits  CBC  URINALYSIS, COMPLETE (UACMP) WITH MICROSCOPIC  CBC WITH DIFFERENTIAL/PLATELET  TROPONIN I  TROPONIN I  TROPONIN I   ____________________________________________  EKG  EKG read and interpreted by me shows normal sinus rhythm rate of 75 normal axis there are diffuse T-wave inversions in ST segment depression especially laterally there are new from 2015  second EKG looks subtly different than the first one. ____________________________________________  RADIOLOGY  ED MD interpretation:  Official radiology report(s): Dg Chest 2 View  Result Date: 10/18/2017 CLINICAL DATA:  Cough.  Intermittent low-grade fever. EXAM: CHEST - 2 VIEW COMPARISON:  Chest x-ray dated February 09, 2015. FINDINGS: Borderline cardiomegaly, unchanged. Normal pulmonary vascularity. Atherosclerotic calcification of the aortic arch. The lungs remain mildly hyperinflated. No focal consolidation, pleural effusion, or pneumothorax. No acute osseous abnormality. Old left-sided rib fractures. IMPRESSION: COPD.  No active cardiopulmonary disease. Electronically Signed   By: Titus Dubin M.D.   On: 10/18/2017 10:34   Ct Angio Chest Pe W And/or Wo Contrast  Result Date: 10/18/2017 CLINICAL DATA:  Cough for 2 weeks EXAM: CT ANGIOGRAPHY CHEST WITH CONTRAST TECHNIQUE: Multidetector CT imaging of the chest was performed using the standard protocol during bolus administration of intravenous contrast. Multiplanar  CT image reconstructions and MIPs were obtained to evaluate the vascular anatomy. CONTRAST:  75mL ISOVUE-370 IOPAMIDOL (ISOVUE-370) INJECTION 76% COMPARISON:  04/03/2011 FINDINGS: Cardiovascular: There are no filling defects in the pulmonary arterial tree to suggest acute pulmonary thromboembolism. Atherosclerotic calcifications of the thoracic aorta are noted. Maximal diameter of the ascending aorta is 3.4 cm. Maximal diameter of the descending aorta is 4.0 cm. Atherosclerotic calcifications in the great vessels are noted. There is no obvious dissection or intramural hematoma. Atherosclerotic changes in the aorta continuing the  abdomen with irregular calcified plaque. Moderate 3 vessel coronary artery calcifications. Extensive hypertrophy of the left ventricle myocardium. Mitral valve and annular calcifications are present. Mediastinum/Nodes: No pericardial effusion. Thyroid is atrophic. No abnormal mediastinal adenopathy. Unremarkable esophagus. Lungs/Pleura: Severe emphysema dependent atelectasis at the lung bases. No pneumothorax. No pleural effusion. Musculoskeletal: New T11 and L1 compression fractures compared with a lateral radiograph dated 02/09/2015. At T11, the superior endplate compression results in 10% loss of height anteriorly. At L1, there is severe height loss and some retropulsion of the superior endplate. Review of the MIP images confirms the above findings. IMPRESSION: No acute pulmonary thromboembolism. 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 Left ventricular hypertrophy. T11 and L1 compression fractures. Aortic Atherosclerosis (ICD10-I70.0) and Emphysema (ICD10-J43.9). Electronically Signed   By: Marybelle Killings M.D.   On: 10/18/2017 12:10   Ct Renal Stone Study  Result Date: 10/18/2017 CLINICAL DATA:  Left flank pain for the past 5 days. EXAM: CT ABDOMEN AND PELVIS WITHOUT CONTRAST TECHNIQUE: Multidetector CT imaging of the abdomen and pelvis was performed  following the standard protocol without IV contrast. COMPARISON:  Abdomen ultrasound dated 12/08/2013. FINDINGS: Lower chest: Prominent pulmonary vasculature at the lung bases. Minimal pericardial fluid with a maximum thickness of 6 mm. No pleural fluid. Coronary artery calcifications. Dense mitral valve annulus calcifications. Hepatobiliary: No focal liver abnormality is seen. No gallstones, gallbladder wall thickening, or biliary dilatation. Pancreas: Diffuse pancreatic atrophy. Spleen: Normal in size without focal abnormality. Adrenals/Urinary Tract: The previously demonstrated 2.0 cm septated cyst in the midpole of the left kidney is grossly stable, measuring 1.9 x 0.9 cm on image number 24 series 2. There is also a 1.1 cm exophytic probable cyst measuring up to 14 Hounsfield units in density arising from the upper pole of the left kidney. There is also a 9 mm exophytic probable cyst measuring up to 15 Hounsfield units in density arising from the lower pole of the right kidney. Normal appearing ureters, urinary bladder and adrenal glands. No urinary tract calculi or hydronephrosis. Stomach/Bowel: Large number of sigmoid and descending colon diverticula. Normal appearing appendix, stomach and small bowel with the exception of an elongated fat density mass in a loop of jejunum in the left mid abdomen, measuring 2.7 x 1.0 cm in maximum dimensions on image number 64 series 5. Vascular/Lymphatic: Atheromatous arterial calcifications without aneurysm. No enlarged lymph nodes. Reproductive: Status post hysterectomy. No adnexal masses. Other: No abdominal wall hernia or abnormality. No abdominopelvic ascites. Musculoskeletal: Approximately 70% L1 vertebral compression deformity with mild bony retropulsion and no acute fracture lines visualized. There is also an approximately 25% T11 vertebral body superior endplate compression deformity with mild Schmorl's node formation and no acute fracture lines. Facet degenerative  changes in the mid and lower lumbar spine with associated grade 1 anterolisthesis at the L4-5 level. No pars defects. Mild to moderate anterior spur formation at multiple levels of the lumbar and lower thoracic spine. IMPRESSION: 1. No urinary tract calculi or hydronephrosis. 2. Small probable mildly complicated cysts arising from the upper pole of the left kidney and lower pole of the right kidney, not seen at previous ultrasound. A repeat right renal ultrasound is recommended to help determine if these are cystic or solid. 3. Pulmonary vascular congestion. 4. Minimal pericardial effusion. 5. Atheromatous arterial calcifications, including the coronary arteries. 6. Extensive sigmoid and descending colon diverticulosis. 7. Old T11 and L1 vertebral compression fractures. 8. 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 Electronically Signed   By: Claudie Revering M.D.   On: 10/18/2017 11:09    ____________________________________________   PROCEDURES  Procedure(s) performed:   Procedures  Critical Care performed:   ____________________________________________   INITIAL IMPRESSION / ASSESSMENT AND PLAN / ED COURSE  patient's O2 sats were 70 on room air when she got back from x-ray.         ____________________________________________   FINAL CLINICAL IMPRESSION(S) / ED DIAGNOSES  Final diagnoses:  Abnormal EKG  Elevated troponin  Left flank pain  Vertebral compression fracture (HCC)  Systolic congestive heart failure, unspecified HF chronicity [facility])     ED Discharge Orders    None       Note:  This document was prepared using Dragon voice recognition software and may include unintentional dictation errors.    Nena Polio, MD 10/18/17 [phone]

## 2017-10-18 NOTE — Consult Note (Addendum)
ORTHOPAEDIC CONSULTATION  REQUESTING PHYSICIAN: Saundra Shelling, MD  Chief Complaint: Compression fractures seen on CT scan  HPI: [name] [name] is a 72 y.o. female who presented with left flank pain for 1 week.  Patient denies any recent falls. She does have a remote history of back injury approximately 5 years ago she estimates.  Patient had a CT to look for kidney stones which was negative. She also had a CT angiogram which showed no evidence of pulmonary embolism. She denies fever or chills. Patient has minimal low back pain. Patient was found to have vertebral compression fractures on her CT of the chest.  Orthopedics is consulted for compression fractures of the spine.  Past Medical History:  Diagnosis Date  . Anxiety   . CAD (coronary artery disease) unk  . COPD (chronic obstructive pulmonary disease) ([place])   . Depression   . Hypercholesteremia unk  . Hypertension    Past Surgical History:  Procedure Laterality Date  . ABDOMINAL HYSTERECTOMY  1982   Social History   Socioeconomic History  . Marital status: Widowed    Spouse name: Not on file  . Number of children: Not on file  . Years of education: Not on file  . Highest education level: Not on file  Occupational History    Employer: RETIRED  Social Needs  . Financial resource strain: Not on file  . Food insecurity:    Worry: Not on file    Inability: Not on file  . Transportation needs:    Medical: Not on file    Non-medical: Not on file  Tobacco Use  . Smoking status: Current Every Day Smoker    Packs/day: 0.50    Years: 50.00    Pack years: 25.00  . Smokeless tobacco: Never Used  Substance and Sexual Activity  . Alcohol use: Yes    Alcohol/week: 4.2 oz    Types: 7 Glasses of wine per week    Comment: 1-2 glasses wine daily  . Drug use: No  . Sexual activity: Not on file  Lifestyle  . Physical activity:    Days per week: Not on file    Minutes per session: Not on file  . Stress: Not on file   Relationships  . Social connections:    Talks on phone: Not on file    Gets together: Not on file    Attends religious service: Not on file    Active member of club or organization: Not on file    Attends meetings of clubs or organizations: Not on file    Relationship status: Not on file  Other Topics Concern  . Not on file  Social History Narrative  . Not on file   Family History  Problem Relation Age of Onset  . Leukemia Mother   . Aneurysm Father   . Diabetes Father   . Heart disease Father   . Diabetes Maternal Grandmother   . Diabetes Paternal Grandmother    No Known Allergies Prior to Admission medications   Medication Sig Start Date End Date Taking? Authorizing Provider  albuterol (PROVENTIL HFA;VENTOLIN HFA) 108 (90 Base) MCG/ACT inhaler Inhale 2 puffs into the lungs every 4 (four) hours as needed for wheezing or shortness of breath. 09/26/17  Yes Mar Daring, PA-C  ALPRAZolam Duanne Moron) 0.5 MG tablet Take 1 tablet (0.5 mg total) by mouth 2 (two) times daily as needed. for anxiety 06/23/17  Yes Rubye Beach  aspirin 81 MG tablet Take 81 mg by mouth  daily.   Yes [provider]  citalopram (CELEXA) 20 MG tablet Take 1 tablet (20 mg total) by mouth daily. May increase to 2 tabs if needed after 1 week 09/26/17  Yes Burnette, Clearnce Sorrel, PA-C  Fluticasone-Salmeterol (ADVAIR) 250-50 MCG/DOSE AEPB Inhale 1 puff into the lungs 2 (two) times daily. 09/26/17  Yes Mar Daring, PA-C  furosemide (LASIX) 20 MG tablet Take 20 mg by mouth daily.   Yes [provider]  lisinopril (PRINIVIL,ZESTRIL) 40 MG tablet Take 40 mg by mouth daily.  11/24/13  Yes [provider]  metoprolol succinate (TOPROL-XL) 50 MG 24 hr tablet Take 50 mg by mouth every evening. Take with or immediately following a meal.   Yes [provider]  pantoprazole (PROTONIX) 40 MG tablet Take 40 mg by mouth daily as needed.   Yes [provider]   potassium chloride SA (K-DUR,KLOR-CON) 20 MEQ tablet Take 1 tablet (20 mEq total) by mouth daily. 10/01/17  Yes Mar Daring, PA-C  simvastatin (ZOCOR) 10 MG tablet Take 1 tablet (10 mg total) by mouth daily. 03/24/17  Yes Mar Daring, PA-C  Tiotropium Bromide Monohydrate (SPIRIVA RESPIMAT) 1.25 MCG/ACT AERS Inhale 1 puff into the lungs daily. 09/26/17  Yes Mar Daring, PA-C  amoxicillin-clavulanate (AUGMENTIN) 875-125 MG tablet Take 1 tablet by mouth 2 (two) times daily. Patient not taking: Reported on 10/18/2017 09/26/17   Mar Daring, PA-C  polyethylene glycol powder (GLYCOLAX/MIRALAX) powder Put 1/2 container in 16 oz gatorade and drink over 1 hour, then put second half in a 16 oz gatorade and drink over hour for bowel clean Patient not taking: Reported on 08/15/2017 06/23/17   Mar Daring, PA-C  predniSONE (STERAPRED UNI-PAK 21 TAB) 10 MG (21) TBPK tablet 6 day taper; take as directed on package instructions Patient not taking: Reported on 10/18/2017 09/26/17   Mar Daring, PA-C  triamcinolone cream (KENALOG) 0.1 % Apply 1 application topically 2 (two) times daily. Patient not taking: Reported on 10/18/2017 12/02/16   Rubye Beach   Dg Chest 2 View  Result Date: 10/18/2017 CLINICAL DATA:  Cough.  Intermittent low-grade fever. EXAM: CHEST - 2 VIEW COMPARISON:  Chest x-ray dated February 09, 2015. FINDINGS: Borderline cardiomegaly, unchanged. Normal pulmonary vascularity. Atherosclerotic calcification of the aortic arch. The lungs remain mildly hyperinflated. No focal consolidation, pleural effusion, or pneumothorax. No acute osseous abnormality. Old left-sided rib fractures. IMPRESSION: COPD.  No active cardiopulmonary disease. Electronically Signed   By: Titus Dubin M.D.   On: 10/18/2017 10:34   Ct Angio Chest Pe W And/or Wo Contrast  Result Date: 10/18/2017 CLINICAL DATA:  Cough for 2 weeks EXAM: CT ANGIOGRAPHY CHEST WITH CONTRAST  TECHNIQUE: Multidetector CT imaging of the chest was performed using the standard protocol during bolus administration of intravenous contrast. Multiplanar CT image reconstructions and MIPs were obtained to evaluate the vascular anatomy. CONTRAST:  62mL ISOVUE-370 IOPAMIDOL (ISOVUE-370) INJECTION 76% COMPARISON:  04/03/2011 FINDINGS: Cardiovascular: There are no filling defects in the pulmonary arterial tree to suggest acute pulmonary thromboembolism. Atherosclerotic calcifications of the thoracic aorta are noted. Maximal diameter of the ascending aorta is 3.4 cm. Maximal diameter of the descending aorta is 4.0 cm. Atherosclerotic calcifications in the great vessels are noted. There is no obvious dissection or intramural hematoma. Atherosclerotic changes in the aorta continuing the abdomen with irregular calcified plaque. Moderate 3 vessel coronary artery calcifications. Extensive hypertrophy of the left ventricle myocardium. Mitral valve and annular calcifications are present.  Mediastinum/Nodes: No pericardial effusion. Thyroid is atrophic. No abnormal mediastinal adenopathy. Unremarkable esophagus. Lungs/Pleura: Severe emphysema dependent atelectasis at the lung bases. No pneumothorax. No pleural effusion. Musculoskeletal: New T11 and L1 compression fractures compared with a lateral radiograph dated 02/09/2015. At T11, the superior endplate compression results in 10% loss of height anteriorly. At L1, there is severe height loss and some retropulsion of the superior endplate. Review of the MIP images confirms the above findings. IMPRESSION: No acute pulmonary thromboembolism. 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 Left ventricular hypertrophy. T11 and L1 compression fractures. Aortic Atherosclerosis (ICD10-I70.0) and Emphysema (ICD10-J43.9). Electronically Signed   By: Marybelle Killings M.D.   On: 10/18/2017 12:10   Ct Renal Stone Study  Result Date: 10/18/2017 CLINICAL DATA:  Left  flank pain for the past 5 days. EXAM: CT ABDOMEN AND PELVIS WITHOUT CONTRAST TECHNIQUE: Multidetector CT imaging of the abdomen and pelvis was performed following the standard protocol without IV contrast. COMPARISON:  Abdomen ultrasound dated 12/08/2013. FINDINGS: Lower chest: Prominent pulmonary vasculature at the lung bases. Minimal pericardial fluid with a maximum thickness of 6 mm. No pleural fluid. Coronary artery calcifications. Dense mitral valve annulus calcifications. Hepatobiliary: No focal liver abnormality is seen. No gallstones, gallbladder wall thickening, or biliary dilatation. Pancreas: Diffuse pancreatic atrophy. Spleen: Normal in size without focal abnormality. Adrenals/Urinary Tract: The previously demonstrated 2.0 cm septated cyst in the midpole of the left kidney is grossly stable, measuring 1.9 x 0.9 cm on image number 24 series 2. There is also a 1.1 cm exophytic probable cyst measuring up to 14 Hounsfield units in density arising from the upper pole of the left kidney. There is also a 9 mm exophytic probable cyst measuring up to 15 Hounsfield units in density arising from the lower pole of the right kidney. Normal appearing ureters, urinary bladder and adrenal glands. No urinary tract calculi or hydronephrosis. Stomach/Bowel: Large number of sigmoid and descending colon diverticula. Normal appearing appendix, stomach and small bowel with the exception of an elongated fat density mass in a loop of jejunum in the left mid abdomen, measuring 2.7 x 1.0 cm in maximum dimensions on image number 64 series 5. Vascular/Lymphatic: Atheromatous arterial calcifications without aneurysm. No enlarged lymph nodes. Reproductive: Status post hysterectomy. No adnexal masses. Other: No abdominal wall hernia or abnormality. No abdominopelvic ascites. Musculoskeletal: Approximately 70% L1 vertebral compression deformity with mild bony retropulsion and no acute fracture lines visualized. There is also an  approximately 25% T11 vertebral body superior endplate compression deformity with mild Schmorl's node formation and no acute fracture lines. Facet degenerative changes in the mid and lower lumbar spine with associated grade 1 anterolisthesis at the L4-5 level. No pars defects. Mild to moderate anterior spur formation at multiple levels of the lumbar and lower thoracic spine. IMPRESSION: 1. No urinary tract calculi or hydronephrosis. 2. Small probable mildly complicated cysts arising from the upper pole of the left kidney and lower pole of the right kidney, not seen at previous ultrasound. A repeat right renal ultrasound is recommended to help determine if these are cystic or solid. 3. Pulmonary vascular congestion. 4. Minimal pericardial effusion. 5. Atheromatous arterial calcifications, including the coronary arteries. 6. Extensive sigmoid and descending colon diverticulosis. 7. Old T11 and L1 vertebral compression fractures. 8. 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 Electronically Signed   By: Claudie Revering M.D.   On: 10/18/2017 11:09    Positive ROS: All other systems  have been reviewed and were otherwise negative with the exception of those mentioned in the HPI and as above.  Physical Exam: General: Alert, no acute distress  MUSCULOSKELETAL: low back: Patient's skin is intact. There is no overlying erythema or ecchymosis or swelling. Patient is slight kyphotic deformity to her spine. She does not have significant tenderness over the posterior spinous processes to palpation.  Patient has palpable pedal pulses in boties. She can flex and extend her toes and dorsiflex and plantarflex ankles without pain or weakness.  Patient states she has bilateral decreased sensation to light touch in her feet. It is unclear whether this is acute or chronic.  Assessment: T11 and L1 compression fractures  Plan: Patient has evidence of T11 and L1 compression fractures on  her CT scan of her chest today. Patient does not have a history of recent fall or injury. She is not having significant low back pain. It is impossible to determine the age of these fractures on CT scan.  Recommend an MRI of the lumbar spine.   If fractures are deemed acute by MRI and if the patient has persistent, significant pain, Dr. Hessie Knows could be consult did for evaluation for possible kyphoplasty.  Patient may benefit from a lumbosacral corset for pain relief. Continue current pain management.   PT consult placed for functional assessment.   Thornton Park, MD    10/18/2017 7:37 PM

## 2017-10-18 NOTE — Progress Notes (Signed)
Advanced care plan.  Purpose of the Encounter: CODE STATUS  Parties in Attendance: Patient and Daughter  Patient's Decision Capacity: Good  Subjective/Patient's story: Presented for flank pain. Has lower abdominal pain and flank pain for 1 wwek.   Objective/Medical story : CT kidney stone protocol showed no stones Potassium is low and will replace Dehydrated : IV fluids CT abdomen showed abdominal aortic aneurysm   Goals of care determination: Discussed with patient and family advance directives. Patient wants every thing done  That is cardiac resuscitation and intubation/ mechanical ventilation if need arises.   CODE STATUS: FULL CODE  Time spent discussing advanced care planning: 16 minutes

## 2017-10-18 NOTE — H&P (Signed)
[name]    MR#:  [id number]  DATE OF BIRTH:  June 09, 1946  DATE OF ADMISSION:  10/18/2017  PRIMARY CARE PHYSICIAN: Mar Daring, PA-C   REQUESTING/REFERRING PHYSICIAN:   CHIEF COMPLAINT:   Chief Complaint  Patient presents with  . Flank Pain    HISTORY OF PRESENT ILLNESS: [name] [name]  is a 72 y.o. female with a known history of coronary artery disease, COPD not on home oxygen, hyperlipidemia, hypertension presented to the emergency room with left flank pain.  Flank pain was going on since 1 week.  Patient had sharp pain 6 out of 10 on a scale of 1-10.  No fever or chills .  She was evaluated with CT kidney stone protocol which showed no stones.  Patient also had elevated d-dimer she was worked up with CT angiogram of the chest which showed no pulmonary embolism.  She was hypoxic and put on oxygen via nasal cannula in the emergency room.  Potassium was low around 2.5 was supplemented orally with 1 dose of 40 M EQ.  No complaints of any chest pain.  No cough, fever or chills.  No history of any fall.  Has some low back pain.  CT angios of the chest incidentally showed a vertebral compression fractures.  First set of troponin was 0.06.  PAST MEDICAL HISTORY:   Past Medical History:  Diagnosis Date  . Anxiety   . CAD (coronary artery disease) unk  . COPD (chronic obstructive pulmonary disease) ([place])   . Depression   . Hypercholesteremia unk  . Hypertension     PAST SURGICAL HISTORY:  Past Surgical History:  Procedure Laterality Date  . ABDOMINAL HYSTERECTOMY  1982    SOCIAL HISTORY:  Social History   Tobacco Use  . Smoking status: Current Every Day Smoker    Packs/day: 0.50    Years: 50.00    Pack years: 25.00  . Smokeless tobacco: Never Used  Substance Use Topics  . Alcohol use: Yes    Alcohol/week: 4.2 oz    Types: 7 Glasses of wine per week    Comment: 1-2 glasses wine daily     FAMILY HISTORY:  Family History  Problem Relation Age of Onset  . Leukemia Mother   . Aneurysm Father   . Diabetes Father   . Heart disease Father   . Diabetes Maternal Grandmother   . Diabetes Paternal Grandmother     DRUG ALLERGIES: No Known Allergies  REVIEW OF SYSTEMS:   CONSTITUTIONAL: No fever, fatigue or weakness.  EYES: No blurred or double vision.  EARS, NOSE, AND THROAT: No tinnitus or ear pain.  RESPIRATORY: No cough, shortness of breath, wheezing or hemoptysis.  CARDIOVASCULAR: No chest pain, orthopnea, edema.  GASTROINTESTINAL: Had nausea, vomiting,  No diarrhea  Had lower abdominal pain.  Left flank pain GENITOURINARY: No dysuria, hematuria.  ENDOCRINE: No polyuria, nocturia,  HEMATOLOGY: No anemia, easy bruising or bleeding SKIN: No rash or lesion. MUSCULOSKELETAL: No joint pain or arthritis.   NEUROLOGIC: No tingling, numbness, weakness.  PSYCHIATRY: No anxiety or depression.   MEDICATIONS AT HOME:  Prior to Admission medications   Medication Sig Start Date End Date Taking? Authorizing Provider  albuterol (PROVENTIL HFA;VENTOLIN HFA) 108 (90 Base) MCG/ACT inhaler Inhale 2 puffs into the lungs every 4 (four) hours as needed for wheezing or shortness of breath. 09/26/17  Yes Fenton Malling M, PA-C  ALPRAZolam Duanne Moron) 0.5 MG tablet Take  1 tablet (0.5 mg total) by mouth 2 (two) times daily as needed. for anxiety 06/23/17  Yes Rubye Beach  aspirin 81 MG tablet Take 81 mg by mouth daily.   Yes [provider]  citalopram (CELEXA) 20 MG tablet Take 1 tablet (20 mg total) by mouth daily. May increase to 2 tabs if needed after 1 week 09/26/17  Yes Burnette, Clearnce Sorrel, PA-C  Fluticasone-Salmeterol (ADVAIR) 250-50 MCG/DOSE AEPB Inhale 1 puff into the lungs 2 (two) times daily. 09/26/17  Yes Mar Daring, PA-C  furosemide (LASIX) 20 MG tablet Take 20 mg by mouth daily.   Yes [provider]  lisinopril (PRINIVIL,ZESTRIL) 40 MG  tablet Take 40 mg by mouth daily.  11/24/13  Yes [provider]  metoprolol succinate (TOPROL-XL) 50 MG 24 hr tablet Take 50 mg by mouth every evening. Take with or immediately following a meal.   Yes [provider]  pantoprazole (PROTONIX) 40 MG tablet Take 40 mg by mouth daily as needed.   Yes [provider]  potassium chloride SA (K-DUR,KLOR-CON) 20 MEQ tablet Take 1 tablet (20 mEq total) by mouth daily. 10/01/17  Yes Mar Daring, PA-C  simvastatin (ZOCOR) 10 MG tablet Take 1 tablet (10 mg total) by mouth daily. 03/24/17  Yes Mar Daring, PA-C  Tiotropium Bromide Monohydrate (SPIRIVA RESPIMAT) 1.25 MCG/ACT AERS Inhale 1 puff into the lungs daily. 09/26/17  Yes Mar Daring, PA-C  amoxicillin-clavulanate (AUGMENTIN) 875-125 MG tablet Take 1 tablet by mouth 2 (two) times daily. Patient not taking: Reported on 10/18/2017 09/26/17   Mar Daring, PA-C  polyethylene glycol powder (GLYCOLAX/MIRALAX) powder Put 1/2 container in 16 oz gatorade and drink over 1 hour, then put second half in a 16 oz gatorade and drink over hour for bowel clean Patient not taking: Reported on 08/15/2017 06/23/17   Mar Daring, PA-C  predniSONE (STERAPRED UNI-PAK 21 TAB) 10 MG (21) TBPK tablet 6 day taper; take as directed on package instructions Patient not taking: Reported on 10/18/2017 09/26/17   Mar Daring, PA-C  triamcinolone cream (KENALOG) 0.1 % Apply 1 application topically 2 (two) times daily. Patient not taking: Reported on 10/18/2017 12/02/16   Mar Daring, PA-C      PHYSICAL EXAMINATION:   VITAL SIGNS: Blood pressure (!) 102/58, pulse 64, temperature 98.4 F (36.9 C), temperature source Oral, resp. rate 12, height 5\' 3"  (1.6 m), weight 55.8 kg (123 lb), SpO2 94 %.  GENERAL:  72 y.o.-year-old patient lying in the bed with no acute distress on oxygen via nasal canula EYES: Pupils equal, round, reactive to light and accommodation. No  scleral icterus. Extraocular muscles intact.  HEENT: Head atraumatic, normocephalic. Oropharynx dry and nasopharynx clear.  NECK:  Supple, no jugular venous distention. No thyroid enlargement, no tenderness.  LUNGS: Normal breath sounds bilaterally, no wheezing, rales,rhonchi or crepitation. No use of accessory muscles of respiration.  CARDIOVASCULAR: S1, S2 normal. No murmurs, rubs, or gallops.  ABDOMEN: Soft, nontender, nondistended. Bowel sounds present. No organomegaly or mass.  Left flank tenderness improved EXTREMITIES: No pedal edema, cyanosis, or clubbing.  NEUROLOGIC: Cranial nerves II through XII are intact. Muscle strength 5/5 in all extremities. Sensation intact. Gait not checked.  PSYCHIATRIC: The patient is alert and oriented x 3.  SKIN: No obvious rash, lesion, or ulcer.   LABORATORY PANEL:   CBC Recent Labs  Lab 10/18/17 0958  WBC 9.3  HGB 15.3  HCT 46.1  PLT 286  MCV 96.4  MCH 32.1  MCHC 33.3  RDW 13.5  LYMPHSABS 1.0  MONOABS 0.5  EOSABS 0.1  BASOSABS 0.1   ------------------------------------------------------------------------------------------------------------------  Chemistries  Recent Labs  Lab 10/18/17 0958  NA 137  K 2.5*  CL 88*  CO2 30  GLUCOSE 130*  BUN 22*  CREATININE 1.20*  CALCIUM 9.3  AST 27  ALT 11*  ALKPHOS 99  BILITOT 1.1   ------------------------------------------------------------------------------------------------------------------ estimated creatinine clearance is 35.6 mL/min (A) (by C-G formula based on SCr of 1.2 mg/dL (H)). ------------------------------------------------------------------------------------------------------------------ No results for input(s): TSH, T4TOTAL, T3FREE, THYROIDAB in the last 72 hours.  Invalid input(s): FREET3   Coagulation profile No results for input(s): INR, PROTIME in the last 168  hours. ------------------------------------------------------------------------------------------------------------------- No results for input(s): DDIMER in the last 72 hours. -------------------------------------------------------------------------------------------------------------------  Cardiac Enzymes Recent Labs  Lab 10/18/17 0958  TROPONINI 0.06*   ------------------------------------------------------------------------------------------------------------------ Invalid input(s): POCBNP  ---------------------------------------------------------------------------------------------------------------  Urinalysis No results found for: COLORURINE, APPEARANCEUR, LABSPEC, PHURINE, GLUCOSEU, HGBUR, BILIRUBINUR, KETONESUR, PROTEINUR, UROBILINOGEN, NITRITE, LEUKOCYTESUR   RADIOLOGY: Dg Chest 2 View  Result Date: 10/18/2017 CLINICAL DATA:  Cough.  Intermittent low-grade fever. EXAM: CHEST - 2 VIEW COMPARISON:  Chest x-ray dated February 09, 2015. FINDINGS: Borderline cardiomegaly, unchanged. Normal pulmonary vascularity. Atherosclerotic calcification of the aortic arch. The lungs remain mildly hyperinflated. No focal consolidation, pleural effusion, or pneumothorax. No acute osseous abnormality. Old left-sided rib fractures. IMPRESSION: COPD.  No active cardiopulmonary disease. Electronically Signed   By: Titus Dubin M.D.   On: 10/18/2017 10:34   Ct Angio Chest Pe W And/or Wo Contrast  Result Date: 10/18/2017 CLINICAL DATA:  Cough for 2 weeks EXAM: CT ANGIOGRAPHY CHEST WITH CONTRAST TECHNIQUE: Multidetector CT imaging of the chest was performed using the standard protocol during bolus administration of intravenous contrast. Multiplanar CT image reconstructions and MIPs were obtained to evaluate the vascular anatomy. CONTRAST:  33mL ISOVUE-370 IOPAMIDOL (ISOVUE-370) INJECTION 76% COMPARISON:  04/03/2011 FINDINGS: Cardiovascular: There are no filling defects in the pulmonary arterial tree to  suggest acute pulmonary thromboembolism. Atherosclerotic calcifications of the thoracic aorta are noted. Maximal diameter of the ascending aorta is 3.4 cm. Maximal diameter of the descending aorta is 4.0 cm. Atherosclerotic calcifications in the great vessels are noted. There is no obvious dissection or intramural hematoma. Atherosclerotic changes in the aorta continuing the abdomen with irregular calcified plaque. Moderate 3 vessel coronary artery calcifications. Extensive hypertrophy of the left ventricle myocardium. Mitral valve and annular calcifications are present. Mediastinum/Nodes: No pericardial effusion. Thyroid is atrophic. No abnormal mediastinal adenopathy. Unremarkable esophagus. Lungs/Pleura: Severe emphysema dependent atelectasis at the lung bases. No pneumothorax. No pleural effusion. Musculoskeletal: New T11 and L1 compression fractures compared with a lateral radiograph dated 02/09/2015. At T11, the superior endplate compression results in 10% loss of height anteriorly. At L1, there is severe height loss and some retropulsion of the superior endplate. Review of the MIP images confirms the above findings. IMPRESSION: No acute pulmonary thromboembolism. 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 Left ventricular hypertrophy. T11 and L1 compression fractures. Aortic Atherosclerosis (ICD10-I70.0) and Emphysema (ICD10-J43.9). Electronically Signed   By: Marybelle Killings M.D.   On: 10/18/2017 12:10   Ct Renal Stone Study  Result Date: 10/18/2017 CLINICAL DATA:  Left flank pain for the past 5 days. EXAM: CT ABDOMEN AND PELVIS WITHOUT CONTRAST TECHNIQUE: Multidetector CT imaging of the abdomen and pelvis was performed following the standard protocol without IV contrast. COMPARISON:  Abdomen ultrasound dated 12/08/2013. FINDINGS: Lower chest: Prominent pulmonary  vasculature at the lung bases. Minimal pericardial fluid with a maximum thickness of 6 mm. No pleural fluid. Coronary  artery calcifications. Dense mitral valve annulus calcifications. Hepatobiliary: No focal liver abnormality is seen. No gallstones, gallbladder wall thickening, or biliary dilatation. Pancreas: Diffuse pancreatic atrophy. Spleen: Normal in size without focal abnormality. Adrenals/Urinary Tract: The previously demonstrated 2.0 cm septated cyst in the midpole of the left kidney is grossly stable, measuring 1.9 x 0.9 cm on image number 24 series 2. There is also a 1.1 cm exophytic probable cyst measuring up to 14 Hounsfield units in density arising from the upper pole of the left kidney. There is also a 9 mm exophytic probable cyst measuring up to 15 Hounsfield units in density arising from the lower pole of the right kidney. Normal appearing ureters, urinary bladder and adrenal glands. No urinary tract calculi or hydronephrosis. Stomach/Bowel: Large number of sigmoid and descending colon diverticula. Normal appearing appendix, stomach and small bowel with the exception of an elongated fat density mass in a loop of jejunum in the left mid abdomen, measuring 2.7 x 1.0 cm in maximum dimensions on image number 64 series 5. Vascular/Lymphatic: Atheromatous arterial calcifications without aneurysm. No enlarged lymph nodes. Reproductive: Status post hysterectomy. No adnexal masses. Other: No abdominal wall hernia or abnormality. No abdominopelvic ascites. Musculoskeletal: Approximately 70% L1 vertebral compression deformity with mild bony retropulsion and no acute fracture lines visualized. There is also an approximately 25% T11 vertebral body superior endplate compression deformity with mild Schmorl's node formation and no acute fracture lines. Facet degenerative changes in the mid and lower lumbar spine with associated grade 1 anterolisthesis at the L4-5 level. No pars defects. Mild to moderate anterior spur formation at multiple levels of the lumbar and lower thoracic spine. IMPRESSION: 1. No urinary tract calculi or  hydronephrosis. 2. Small probable mildly complicated cysts arising from the upper pole of the left kidney and lower pole of the right kidney, not seen at previous ultrasound. A repeat right renal ultrasound is recommended to help determine if these are cystic or solid. 3. Pulmonary vascular congestion. 4. Minimal pericardial effusion. 5. Atheromatous arterial calcifications, including the coronary arteries. 6. Extensive sigmoid and descending colon diverticulosis. 7. Old T11 and L1 vertebral compression fractures. 8. 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 Electronically Signed   By: Claudie Revering M.D.   On: 10/18/2017 11:09    EKG: Orders placed or performed during the hospital encounter of 10/18/17  . ED EKG  . ED EKG  . EKG 12-Lead  . EKG 12-Lead  . ED EKG  . ED EKG  . EKG 12-Lead  . EKG 12-Lead    IMPRESSION AND PLAN:  72 year old female patient with history of emphysema, coronary artery disease, anxiety disorder, hyperlipidemia, hypertension presented to the emergency room with flank pain and low oxygen level.  1.  Hypokalemia Replace potassium intravenously  2 dehydration. IV fluid hydration Hold Lasix for now  3.  Hypoxia Evaluate for infectious etiology  4.  Elevated troponin secondary to demand ischemia Cycle troponin  5.  Emphysema Oxygen via Cannula  6.  Flank pain probably secondary to renal cyst  renal ultrasound as outpatient Check urine analysis  7.  Descending thoracic aortic aneurysm Periodic imaging as outpatient  8.  Old T11 and L1 compression fracture Pain Management   All the records are reviewed and case discussed with ED provider. Management plans discussed with the patient, family and they are  in agreement.  CODE STATUS:FULL CODE    TOTAL TIME TAKING CARE OF THIS PATIENT: 57 minutes.    Saundra Shelling M.D on 10/18/2017 at 3:34 PM  Between 7am to 6pm - Pager - [phone]  After 6pm go to  [email or web address] - password [email or web address] [facility]  Office  [phone]  CC: Primary care physician; Mar Daring, PA-C

## 2017-10-19 ENCOUNTER — Inpatient Hospital Stay: Payer: Medicare Other

## 2017-10-19 LAB — CBC
HEMATOCRIT: 38.1 % (ref 35.0–47.0)
HEMOGLOBIN: 12.7 g/dL (ref 12.0–16.0)
MCH: 32.4 pg (ref 26.0–34.0)
MCHC: 33.3 g/dL (ref 32.0–36.0)
MCV: 97.1 fL (ref 80.0–100.0)
Platelets: 214 10*3/uL (ref 150–440)
RBC: 3.93 MIL/uL (ref 3.80–5.20)
RDW: 13.6 % (ref 11.5–14.5)
WBC: 6.3 10*3/uL (ref 3.6–11.0)

## 2017-10-19 LAB — URINALYSIS, ROUTINE W REFLEX MICROSCOPIC
Bilirubin Urine: NEGATIVE
GLUCOSE, UA: NEGATIVE mg/dL
Hgb urine dipstick: NEGATIVE
KETONES UR: NEGATIVE mg/dL
LEUKOCYTES UA: NEGATIVE
NITRITE: NEGATIVE
PH: 5 (ref 5.0–8.0)
Protein, ur: NEGATIVE mg/dL
SPECIFIC GRAVITY, URINE: 1.044 — AB (ref 1.005–1.030)

## 2017-10-19 LAB — TROPONIN I: Troponin I: 0.07 ng/mL (ref <0.03)

## 2017-10-19 LAB — BASIC METABOLIC PANEL
ANION GAP: 9 (ref 5–15)
BUN: 25 mg/dL — ABNORMAL HIGH (ref 6–20)
CALCIUM: 8.2 mg/dL — AB (ref 8.9–10.3)
CO2: 30 mmol/L (ref 22–32)
Chloride: 100 mmol/L — ABNORMAL LOW (ref 101–111)
Creatinine, Ser: 1.43 mg/dL — ABNORMAL HIGH (ref 0.44–1.00)
GFR calc non Af Amer: 36 mL/min — ABNORMAL LOW (ref >60)
GFR, EST AFRICAN AMERICAN: 42 mL/min — AB (ref >60)
GLUCOSE: 94 mg/dL (ref 65–99)
POTASSIUM: 3.3 mmol/L — AB (ref 3.5–5.1)
Sodium: 139 mmol/L (ref 135–145)

## 2017-10-19 MED ORDER — POTASSIUM CHLORIDE CRYS ER 20 MEQ PO TBCR
40.0000 meq | EXTENDED_RELEASE_TABLET | Freq: Once | ORAL | Status: AC
Start: 2017-10-19 — End: 2017-10-19
  Administered 2017-10-19: 40 meq via ORAL
  Filled 2017-10-19: qty 2

## 2017-10-19 MED ORDER — BISACODYL 5 MG PO TBEC
5.0000 mg | DELAYED_RELEASE_TABLET | Freq: Every day | ORAL | Status: DC | PRN
  Administered 2017-10-19 – 2017-10-22 (×4): 5 mg via ORAL
  Filled 2017-10-19 (×5): qty 1

## 2017-10-19 MED ORDER — ENOXAPARIN SODIUM 60 MG/0.6ML ~~LOC~~ SOLN
1.0000 mg/kg | Freq: Two times a day (BID) | SUBCUTANEOUS | Status: DC
  Administered 2017-10-19 (×2): 55 mg via SUBCUTANEOUS
  Filled 2017-10-19: qty 0.6

## 2017-10-19 MED ORDER — SENNA 8.6 MG PO TABS
1.0000 | ORAL_TABLET | Freq: Every day | ORAL | Status: DC
  Administered 2017-10-19 – 2017-10-24 (×7): 8.6 mg via ORAL
  Filled 2017-10-19 (×6): qty 1

## 2017-10-19 MED ORDER — ENOXAPARIN SODIUM 60 MG/0.6ML ~~LOC~~ SOLN
1.0000 mg/kg | Freq: Two times a day (BID) | SUBCUTANEOUS | Status: DC
  Filled 2017-10-19 (×2): qty 0.6

## 2017-10-19 MED ORDER — ALPRAZOLAM 0.25 MG PO TABS
0.2500 mg | ORAL_TABLET | Freq: Once | ORAL | Status: AC
  Administered 2017-10-19: 0.25 mg via ORAL
  Filled 2017-10-19: qty 1

## 2017-10-19 NOTE — Progress Notes (Signed)
Subjective:  MRI done which demonstrates compression fractures are chronic.  There is moderate spinal stenosis and possible nerve encroachment on the right L4 or L5 nerve roots.  Patient reports low back pain as mild to moderate.  She denies numbness or tingling in her feet today.    Objective:   VITALS:   Vitals:   10/18/17 1933 10/18/17 2114 10/19/17 0523 10/19/17 0856  BP: (!) 85/46 (!) 83/65 (!) 91/44 (!) 83/53  Pulse: 60 85 66 71  Resp: 17  18   Temp: 97.8 F (36.6 C)  98.2 F (36.8 C) 98.3 F (36.8 C)  TempSrc: Oral  Oral Oral  SpO2: 96%  92% 93%  Weight:   49.6 kg (109 lb 4.8 oz)   Height:        PHYSICAL EXAM: Bilateral lower extremities: Neurovascular intact Sensation intact distally Intact pulses distally Dorsiflexion/Plantar flexion intact No cellulitis present Compartment soft  LABS  Results for orders placed or performed during the hospital encounter of 10/18/17 (from the past 24 hour(s))  Troponin I (q 6hr x 3)     Status: Abnormal   Collection Time: 10/18/17  4:36 PM  Result Value Ref Range   Troponin I 0.06 (HH) <0.03 ng/mL  Troponin I (q 6hr x 3)     Status: Abnormal   Collection Time: 10/18/17  9:11 PM  Result Value Ref Range   Troponin I 0.07 (HH) <0.03 ng/mL  Troponin I (q 6hr x 3)     Status: Abnormal   Collection Time: 10/19/17  4:15 AM  Result Value Ref Range   Troponin I 0.07 (HH) <0.03 ng/mL  Basic metabolic panel     Status: Abnormal   Collection Time: 10/19/17  4:15 AM  Result Value Ref Range   Sodium 139 135 - 145 mmol/L   Potassium 3.3 (L) 3.5 - 5.1 mmol/L   Chloride 100 (L) 101 - 111 mmol/L   CO2 30 22 - 32 mmol/L   Glucose, Bld 94 65 - 99 mg/dL   BUN 25 (H) 6 - 20 mg/dL   Creatinine, Ser 1.43 (H) 0.44 - 1.00 mg/dL   Calcium 8.2 (L) 8.9 - 10.3 mg/dL   GFR calc non Af Amer 36 (L) >60 mL/min   GFR calc Af Amer 42 (L) >60 mL/min   Anion gap 9 5 - 15  CBC     Status: None   Collection Time: 10/19/17  4:15 AM  Result Value Ref  Range   WBC 6.3 3.6 - 11.0 K/uL   RBC 3.93 3.80 - 5.20 MIL/uL   Hemoglobin 12.7 12.0 - 16.0 g/dL   HCT 38.1 35.0 - 47.0 %   MCV 97.1 80.0 - 100.0 fL   MCH 32.4 26.0 - 34.0 pg   MCHC 33.3 32.0 - 36.0 g/dL   RDW 13.6 11.5 - 14.5 %   Platelets 214 150 - 440 K/uL    Dg Chest 2 View  Result Date: 10/18/2017 CLINICAL DATA:  Cough.  Intermittent low-grade fever. EXAM: CHEST - 2 VIEW COMPARISON:  Chest x-ray dated February 09, 2015. FINDINGS: Borderline cardiomegaly, unchanged. Normal pulmonary vascularity. Atherosclerotic calcification of the aortic arch. The lungs remain mildly hyperinflated. No focal consolidation, pleural effusion, or pneumothorax. No acute osseous abnormality. Old left-sided rib fractures. IMPRESSION: COPD.  No active cardiopulmonary disease. Electronically Signed   By: Titus Dubin M.D.   On: 10/18/2017 10:34   Ct Angio Chest Pe W And/or Wo Contrast  Result Date: 10/18/2017 CLINICAL DATA:  Cough for 2 weeks EXAM: CT ANGIOGRAPHY CHEST WITH CONTRAST TECHNIQUE: Multidetector CT imaging of the chest was performed using the standard protocol during bolus administration of intravenous contrast. Multiplanar CT image reconstructions and MIPs were obtained to evaluate the vascular anatomy. CONTRAST:  72mL ISOVUE-370 IOPAMIDOL (ISOVUE-370) INJECTION 76% COMPARISON:  04/03/2011 FINDINGS: Cardiovascular: There are no filling defects in the pulmonary arterial tree to suggest acute pulmonary thromboembolism. Atherosclerotic calcifications of the thoracic aorta are noted. Maximal diameter of the ascending aorta is 3.4 cm. Maximal diameter of the descending aorta is 4.0 cm. Atherosclerotic calcifications in the great vessels are noted. There is no obvious dissection or intramural hematoma. Atherosclerotic changes in the aorta continuing the abdomen with irregular calcified plaque. Moderate 3 vessel coronary artery calcifications. Extensive hypertrophy of the left ventricle myocardium. Mitral valve  and annular calcifications are present. Mediastinum/Nodes: No pericardial effusion. Thyroid is atrophic. No abnormal mediastinal adenopathy. Unremarkable esophagus. Lungs/Pleura: Severe emphysema dependent atelectasis at the lung bases. No pneumothorax. No pleural effusion. Musculoskeletal: New T11 and L1 compression fractures compared with a lateral radiograph dated 02/09/2015. At T11, the superior endplate compression results in 10% loss of height anteriorly. At L1, there is severe height loss and some retropulsion of the superior endplate. Review of the MIP images confirms the above findings. IMPRESSION: No acute pulmonary thromboembolism. 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 Left ventricular hypertrophy. T11 and L1 compression fractures. Aortic Atherosclerosis (ICD10-I70.0) and Emphysema (ICD10-J43.9). Electronically Signed   By: Marybelle Killings M.D.   On: 10/18/2017 12:10   Mr Lumbar Spine Wo Contrast  Result Date: 10/19/2017 CLINICAL DATA:  Low back pain following injury. EXAM: MRI LUMBAR SPINE WITHOUT CONTRAST TECHNIQUE: Multiplanar, multisequence MR imaging of the lumbar spine was performed. No intravenous contrast was administered. COMPARISON:  CT abdomen pelvis 10/18/2017. FINDINGS: Segmentation: Conventional anatomy assumed, with the last open disc space designated L5-S1. Alignment: Similar to recent CT with a mild convex left scoliosis. There is a degenerative grade 1 anterolisthesis at L4-5. Vertebrae: There is an old healed mild superior endplate compression deformity at T11. A more severe biconcave compression fracture at L1 is associated with 3 mm of osseous retropulsion and mild residual marrow edema. The posterior elements are intact. No evidence of acute fracture or pars defect. The visualized sacroiliac joints appear unremarkable. Conus medullaris: Extends to the L1 level and appears normal. Fatty filum noted. Paraspinal and other soft tissues: Small left renal  cysts are stable. Disc levels: Sagittal images demonstrate mild disc degeneration from T10-11 through T12-L1. No resulting spinal stenosis or nerve root encroachment. L1-2: Disc height and hydration are largely maintained. There is mild disc bulging. No spinal stenosis or nerve root encroachment. L2-3: Mild disc bulging eccentric to the left with mild facet and ligamentous hypertrophy. Mild spinal stenosis. No nerve root encroachment. L3-4: Mild disc bulging, facet and ligamentous hypertrophy. Mild spinal stenosis. No nerve root encroachment. L4-5: There is loss of disc height with annular disc bulging eccentric to the right. There is advanced facet hypertrophy accounting for the grade 1 anterolisthesis. These factors contribute to moderate spinal stenosis with asymmetric narrowing of the right lateral recess and right foramen. There is possible encroachment on the right L4 and L5 nerve roots. L5-S1: Disc height and hydration are maintained. Mild facet hypertrophy, worse on the left. No significant spinal stenosis or nerve root encroachment. IMPRESSION: 1. Largely healed L1 biconcave compression fracture with osseous retropulsion and mild residual marrow edema. Old healed T11 superior endplate compression deformity.  No acute osseous findings. 2. Moderate multifactorial spinal stenosis at L4-5, secondary to annular disc bulging, facet hypertrophy and resulting grade 1 anterolisthesis. There is asymmetric narrowing of the right lateral recess and right foramen with possible encroachment on the right L4 and L5 nerve roots. 3. Mild multifactorial spinal stenosis at L2-3 and L3-4 without nerve root encroachment. Electronically Signed   By: Richardean Sale M.D.   On: 10/19/2017 12:48   Ct Renal Stone Study  Result Date: 10/18/2017 CLINICAL DATA:  Left flank pain for the past 5 days. EXAM: CT ABDOMEN AND PELVIS WITHOUT CONTRAST TECHNIQUE: Multidetector CT imaging of the abdomen and pelvis was performed following the  standard protocol without IV contrast. COMPARISON:  Abdomen ultrasound dated 12/08/2013. FINDINGS: Lower chest: Prominent pulmonary vasculature at the lung bases. Minimal pericardial fluid with a maximum thickness of 6 mm. No pleural fluid. Coronary artery calcifications. Dense mitral valve annulus calcifications. Hepatobiliary: No focal liver abnormality is seen. No gallstones, gallbladder wall thickening, or biliary dilatation. Pancreas: Diffuse pancreatic atrophy. Spleen: Normal in size without focal abnormality. Adrenals/Urinary Tract: The previously demonstrated 2.0 cm septated cyst in the midpole of the left kidney is grossly stable, measuring 1.9 x 0.9 cm on image number 24 series 2. There is also a 1.1 cm exophytic probable cyst measuring up to 14 Hounsfield units in density arising from the upper pole of the left kidney. There is also a 9 mm exophytic probable cyst measuring up to 15 Hounsfield units in density arising from the lower pole of the right kidney. Normal appearing ureters, urinary bladder and adrenal glands. No urinary tract calculi or hydronephrosis. Stomach/Bowel: Large number of sigmoid and descending colon diverticula. Normal appearing appendix, stomach and small bowel with the exception of an elongated fat density mass in a loop of jejunum in the left mid abdomen, measuring 2.7 x 1.0 cm in maximum dimensions on image number 64 series 5. Vascular/Lymphatic: Atheromatous arterial calcifications without aneurysm. No enlarged lymph nodes. Reproductive: Status post hysterectomy. No adnexal masses. Other: No abdominal wall hernia or abnormality. No abdominopelvic ascites. Musculoskeletal: Approximately 70% L1 vertebral compression deformity with mild bony retropulsion and no acute fracture lines visualized. There is also an approximately 25% T11 vertebral body superior endplate compression deformity with mild Schmorl's node formation and no acute fracture lines. Facet degenerative changes in the  mid and lower lumbar spine with associated grade 1 anterolisthesis at the L4-5 level. No pars defects. Mild to moderate anterior spur formation at multiple levels of the lumbar and lower thoracic spine. IMPRESSION: 1. No urinary tract calculi or hydronephrosis. 2. Small probable mildly complicated cysts arising from the upper pole of the left kidney and lower pole of the right kidney, not seen at previous ultrasound. A repeat right renal ultrasound is recommended to help determine if these are cystic or solid. 3. Pulmonary vascular congestion. 4. Minimal pericardial effusion. 5. Atheromatous arterial calcifications, including the coronary arteries. 6. Extensive sigmoid and descending colon diverticulosis. 7. Old T11 and L1 vertebral compression fractures. 8. 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 Electronically Signed   By: Claudie Revering M.D.   On: 10/18/2017 11:09    Assessment/Plan:     Active Problems:   Hypokalemia  Patient would benefit from PT evaluation.  She may use a lumbar corset for comfort.  If patient complains of persistent back pain or lower extremity pain, numbness or weakness, consult neurosurgery for further recommendations.    Thornton Park ,  MD 10/19/2017, 1:34 PM

## 2017-10-19 NOTE — Consult Note (Signed)
Reason for Consult: Shortness of breath dyspnea elevated troponins Referring Physician: Fenton Malling PA primary Dr. Estanislado Pandy hospitalist Cardiologist Dr. Angus Palms [name] [name] is an 72 y.o. female.  HPI: Patient presents with known coronary disease COPD not on home O2 hyperlipidemia hypertension presented with flank pain.  Patient had symptoms for the past week after a fall did not seek medical attention after the fall pain is sharp reproducible with palpation.  Patient was evaluated by CT which showed no kidney stone.  Patient was found to have elevated d-dimer CT angiogram of the chest showed no pulmonary embolus patient was hypoxic placed on supplemental oxygen therapy was found to have a very low potassium 2.5 been on Lasix therapy complained of generalized weakness has borderline troponins has known compression fractures and with persistent back pain now here for follow-up cardiology evaluation with because of dyspnea  Past Medical History:  Diagnosis Date  . Anxiety   . CAD (coronary artery disease) unk  . COPD (chronic obstructive pulmonary disease) ([place])   . Depression   . Hypercholesteremia unk  . Hypertension     Past Surgical History:  Procedure Laterality Date  . ABDOMINAL HYSTERECTOMY  1982    Family History  Problem Relation Age of Onset  . Leukemia Mother   . Aneurysm Father   . Diabetes Father   . Heart disease Father   . Diabetes Maternal Grandmother   . Diabetes Paternal Grandmother     Social History:  reports that she has been smoking.  She has a 25.00 pack-year smoking history. She has never used smokeless tobacco. She reports that she drinks about 4.2 oz of alcohol per week. She reports that she does not use drugs.  Allergies: No Known Allergies  Medications: I have reviewed the patient's current medications.  Results for orders placed or performed during the hospital encounter of 10/18/17 (from the past 48 hour(s))  CBC     Status: None    Collection Time: 10/18/17  9:58 AM  Result Value Ref Range   WBC 9.3 3.6 - 11.0 K/uL   RBC 4.78 3.80 - 5.20 MIL/uL   Hemoglobin 15.3 12.0 - 16.0 g/dL   HCT 46.1 35.0 - 47.0 %   MCV 96.4 80.0 - 100.0 fL   MCH 32.1 26.0 - 34.0 pg   MCHC 33.3 32.0 - 36.0 g/dL   RDW 13.5 11.5 - 14.5 %   Platelets 286 150 - 440 K/uL    Comment: Performed at [facility], [address] [place], [place] [address]  Comprehensive metabolic panel     Status: Abnormal   Collection Time: 10/18/17  9:58 AM  Result Value Ref Range   Sodium 137 135 - 145 mmol/L   Potassium 2.5 (LL) 3.5 - 5.1 mmol/L    Comment: CRITICAL RESULT CALLED TO, READ BACK BY AND VERIFIED WITH KAYLEE WALKER ON 10/18/17 AT 1047 [place]    Chloride 88 (L) 101 - 111 mmol/L   CO2 30 22 - 32 mmol/L   Glucose, Bld 130 (H) 65 - 99 mg/dL   BUN 22 (H) 6 - 20 mg/dL   Creatinine, Ser 1.20 (H) 0.44 - 1.00 mg/dL   Calcium 9.3 8.9 - 10.3 mg/dL   Total Protein 8.9 (H) 6.5 - 8.1 g/dL   Albumin 4.3 3.5 - 5.0 g/dL   AST 27 15 - 41 U/L   ALT 11 (L) 14 - 54 U/L   Alkaline Phosphatase 99 38 - 126 U/L   Total Bilirubin 1.1  0.3 - 1.2 mg/dL   GFR calc non Af Amer 44 (L) >60 mL/min   GFR calc Af Amer 51 (L) >60 mL/min    Comment: (NOTE) The eGFR has been calculated using the CKD EPI equation. This calculation has not been validated in all clinical situations. eGFR's persistently <60 mL/min signify possible Chronic Kidney Disease.    Anion gap 19 (H) 5 - 15    Comment: Performed at [facility], [address] [place], [place] [address]  Brain natriuretic peptide     Status: Abnormal   Collection Time: 10/18/17  9:58 AM  Result Value Ref Range   B Natriuretic Peptide 876.0 (H) 0.0 - 100.0 pg/mL    Comment: Performed at [facility], [address] [place], [place] [address]  Troponin I     Status: Abnormal   Collection Time: 10/18/17  9:58 AM  Result Value Ref Range   Troponin I 0.06 (HH) <0.03 ng/mL    Comment: CRITICAL  RESULT CALLED TO, READ BACK BY AND VERIFIED WITH DR Cinda Quest ON 10/18/17 AT 1317 [facility] Performed at [facility], [address] [place], [place] [address]   Differential     Status: Abnormal   Collection Time: 10/18/17  9:58 AM  Result Value Ref Range   Neutrophils Relative % 81 %   Neutro Abs 7.5 (H) 1.4 - 6.5 K/uL   Lymphocytes Relative 11 %   Lymphs Abs 1.0 1.0 - 3.6 K/uL   Monocytes Relative 6 %   Monocytes Absolute 0.5 0.2 - 0.9 K/uL   Eosinophils Relative 1 %   Eosinophils Absolute 0.1 0 - 0.7 K/uL   Basophils Relative 1 %   Basophils Absolute 0.1 0 - 0.1 K/uL    Comment: Performed at [facility], [address] [place], [place] [address]  Urinalysis, Routine w reflex microscopic     Status: Abnormal   Collection Time: 10/18/17 10:38 AM  Result Value Ref Range   Color, Urine YELLOW (A) YELLOW   APPearance HAZY (A) CLEAR   Specific Gravity, Urine 1.044 (H) 1.005 - 1.030   pH 5.0 5.0 - 8.0   Glucose, UA NEGATIVE NEGATIVE mg/dL   Hgb urine dipstick NEGATIVE NEGATIVE   Bilirubin Urine NEGATIVE NEGATIVE   Ketones, ur NEGATIVE NEGATIVE mg/dL   Protein, ur NEGATIVE NEGATIVE mg/dL   Nitrite NEGATIVE NEGATIVE   Leukocytes, UA NEGATIVE NEGATIVE    Comment: Performed at [facility], [address] [place], [place] [address]  Fibrin derivatives D-[facility] only)     Status: Abnormal   Collection Time: 10/18/17 10:53 AM  Result Value Ref Range   Fibrin derivatives D-dimer ([facility]) 1,339.48 (H) 0.00 - 499.00 ng/mL (FEU)    Comment: (NOTE) <> Exclusion of Venous Thromboembolism (VTE) - OUTPATIENT ONLY   (Emergency Department or [place])   0-499 ng/ml (FEU): With a low to intermediate pretest probability                      for VTE this test result excludes the diagnosis                      of VTE.   >499 ng/ml (FEU) : VTE not excluded; additional work up for VTE is                      required. <> Testing on Inpatients and Evaluation of  Disseminated Intravascular   Coagulation (  DIC) Reference Range:   0-499 ng/ml (FEU) Performed at [facility], [place], [place] [address]   Troponin I (q 6hr x 3)     Status: Abnormal   Collection Time: 10/18/17  4:36 PM  Result Value Ref Range   Troponin I 0.06 (HH) <0.03 ng/mL    Comment: CRITICAL VALUE NOTED. VALUE IS CONSISTENT WITH PREVIOUSLY REPORTED/CALLED VALUE.MSS Performed at [facility], [place], [place] [address]   Troponin I (q 6hr x 3)     Status: Abnormal   Collection Time: 10/18/17  9:11 PM  Result Value Ref Range   Troponin I 0.07 (HH) <0.03 ng/mL    Comment: CRITICAL VALUE NOTED. VALUE IS CONSISTENT WITH PREVIOUSLY REPORTED/CALLED VALUE.PMH Performed at [facility], [place], [place] [address]   Troponin I (q 6hr x 3)     Status: Abnormal   Collection Time: 10/19/17  4:15 AM  Result Value Ref Range   Troponin I 0.07 (HH) <0.03 ng/mL    Comment: CRITICAL VALUE NOTED. VALUE IS CONSISTENT WITH PREVIOUSLY REPORTED/CALLED VALUE.PMH Performed at [facility], [address] [place], [place] [address]   Basic metabolic panel     Status: Abnormal   Collection Time: 10/19/17  4:15 AM  Result Value Ref Range   Sodium 139 135 - 145 mmol/L   Potassium 3.3 (L) 3.5 - 5.1 mmol/L   Chloride 100 (L) 101 - 111 mmol/L   CO2 30 22 - 32 mmol/L   Glucose, Bld 94 65 - 99 mg/dL   BUN 25 (H) 6 - 20 mg/dL   Creatinine, Ser 1.43 (H) 0.44 - 1.00 mg/dL   Calcium 8.2 (L) 8.9 - 10.3 mg/dL   GFR calc non Af Amer 36 (L) >60 mL/min   GFR calc Af Amer 42 (L) >60 mL/min    Comment: (NOTE) The eGFR has been calculated using the CKD EPI equation. This calculation has not been validated in all clinical situations. eGFR's persistently <60 mL/min signify possible Chronic Kidney Disease.    Anion gap 9 5 - 15    Comment: Performed at [facility], [address] [place], [place] [address]  CBC      Status: None   Collection Time: 10/19/17  4:15 AM  Result Value Ref Range   WBC 6.3 3.6 - 11.0 K/uL   RBC 3.93 3.80 - 5.20 MIL/uL   Hemoglobin 12.7 12.0 - 16.0 g/dL   HCT 38.1 35.0 - 47.0 %   MCV 97.1 80.0 - 100.0 fL   MCH 32.4 26.0 - 34.0 pg   MCHC 33.3 32.0 - 36.0 g/dL   RDW 13.6 11.5 - 14.5 %   Platelets 214 150 - 440 K/uL    Comment: Performed at [facility], [address] [place],  [address]    Dg Chest 2 View  Result Date: 10/18/2017 CLINICAL DATA:  Cough.  Intermittent low-grade fever. EXAM: CHEST - 2 VIEW COMPARISON:  Chest x-ray dated February 09, 2015. FINDINGS: Borderline cardiomegaly, unchanged. Normal pulmonary vascularity. Atherosclerotic calcification of the aortic arch. The lungs remain mildly hyperinflated. No focal consolidation, pleural effusion, or pneumothorax. No acute osseous abnormality. Old left-sided rib fractures. IMPRESSION: COPD.  No active cardiopulmonary disease. Electronically Signed   By: Titus Dubin M.D.   On: 10/18/2017 10:34   Ct Angio Chest Pe W And/or Wo Contrast  Result Date: 10/18/2017 CLINICAL DATA:  Cough for 2 weeks EXAM: CT ANGIOGRAPHY CHEST WITH  CONTRAST TECHNIQUE: Multidetector CT imaging of the chest was performed using the standard protocol during bolus administration of intravenous contrast. Multiplanar CT image reconstructions and MIPs were obtained to evaluate the vascular anatomy. CONTRAST:  75m ISOVUE-370 IOPAMIDOL (ISOVUE-370) INJECTION 76% COMPARISON:  04/03/2011 FINDINGS: Cardiovascular: There are no filling defects in the pulmonary arterial tree to suggest acute pulmonary thromboembolism. Atherosclerotic calcifications of the thoracic aorta are noted. Maximal diameter of the ascending aorta is 3.4 cm. Maximal diameter of the descending aorta is 4.0 cm. Atherosclerotic calcifications in the great vessels are noted. There is no obvious dissection or intramural hematoma. Atherosclerotic changes in the aorta continuing  the abdomen with irregular calcified plaque. Moderate 3 vessel coronary artery calcifications. Extensive hypertrophy of the left ventricle myocardium. Mitral valve and annular calcifications are present. Mediastinum/Nodes: No pericardial effusion. Thyroid is atrophic. No abnormal mediastinal adenopathy. Unremarkable esophagus. Lungs/Pleura: Severe emphysema dependent atelectasis at the lung bases. No pneumothorax. No pleural effusion. Musculoskeletal: New T11 and L1 compression fractures compared with a lateral radiograph dated 02/09/2015. At T11, the superior endplate compression results in 10% loss of height anteriorly. At L1, there is severe height loss and some retropulsion of the superior endplate. Review of the MIP images confirms the above findings. IMPRESSION: No acute pulmonary thromboembolism. 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 Left ventricular hypertrophy. T11 and L1 compression fractures. Aortic Atherosclerosis (ICD10-I70.0) and Emphysema (ICD10-J43.9). Electronically Signed   By: AMarybelle KillingsM.D.   On: 10/18/2017 12:10   Mr Lumbar Spine Wo Contrast  Result Date: 10/19/2017 CLINICAL DATA:  Low back pain following injury. EXAM: MRI LUMBAR SPINE WITHOUT CONTRAST TECHNIQUE: Multiplanar, multisequence MR imaging of the lumbar spine was performed. No intravenous contrast was administered. COMPARISON:  CT abdomen pelvis 10/18/2017. FINDINGS: Segmentation: Conventional anatomy assumed, with the last open disc space designated L5-S1. Alignment: Similar to recent CT with a mild convex left scoliosis. There is a degenerative grade 1 anterolisthesis at L4-5. Vertebrae: There is an old healed mild superior endplate compression deformity at T11. A more severe biconcave compression fracture at L1 is associated with 3 mm of osseous retropulsion and mild residual marrow edema. The posterior elements are intact. No evidence of acute fracture or pars defect. The visualized  sacroiliac joints appear unremarkable. Conus medullaris: Extends to the L1 level and appears normal. Fatty filum noted. Paraspinal and other soft tissues: Small left renal cysts are stable. Disc levels: Sagittal images demonstrate mild disc degeneration from T10-11 through T12-L1. No resulting spinal stenosis or nerve root encroachment. L1-2: Disc height and hydration are largely maintained. There is mild disc bulging. No spinal stenosis or nerve root encroachment. L2-3: Mild disc bulging eccentric to the left with mild facet and ligamentous hypertrophy. Mild spinal stenosis. No nerve root encroachment. L3-4: Mild disc bulging, facet and ligamentous hypertrophy. Mild spinal stenosis. No nerve root encroachment. L4-5: There is loss of disc height with annular disc bulging eccentric to the right. There is advanced facet hypertrophy accounting for the grade 1 anterolisthesis. These factors contribute to moderate spinal stenosis with asymmetric narrowing of the right lateral recess and right foramen. There is possible encroachment on the right L4 and L5 nerve roots. L5-S1: Disc height and hydration are maintained. Mild facet hypertrophy, worse on the left. No significant spinal stenosis or nerve root encroachment. IMPRESSION: 1. Largely healed L1 biconcave compression fracture with osseous retropulsion and mild residual marrow edema. Old healed T11 superior endplate compression deformity. No acute osseous findings. 2. Moderate multifactorial spinal stenosis  at L4-5, secondary to annular disc bulging, facet hypertrophy and resulting grade 1 anterolisthesis. There is asymmetric narrowing of the right lateral recess and right foramen with possible encroachment on the right L4 and L5 nerve roots. 3. Mild multifactorial spinal stenosis at L2-3 and L3-4 without nerve root encroachment. Electronically Signed   By: Richardean Sale M.D.   On: 10/19/2017 12:48   Ct Renal Stone Study  Result Date: 10/18/2017 CLINICAL DATA:   Left flank pain for the past 5 days. EXAM: CT ABDOMEN AND PELVIS WITHOUT CONTRAST TECHNIQUE: Multidetector CT imaging of the abdomen and pelvis was performed following the standard protocol without IV contrast. COMPARISON:  Abdomen ultrasound dated 12/08/2013. FINDINGS: Lower chest: Prominent pulmonary vasculature at the lung bases. Minimal pericardial fluid with a maximum thickness of 6 mm. No pleural fluid. Coronary artery calcifications. Dense mitral valve annulus calcifications. Hepatobiliary: No focal liver abnormality is seen. No gallstones, gallbladder wall thickening, or biliary dilatation. Pancreas: Diffuse pancreatic atrophy. Spleen: Normal in size without focal abnormality. Adrenals/Urinary Tract: The previously demonstrated 2.0 cm septated cyst in the midpole of the left kidney is grossly stable, measuring 1.9 x 0.9 cm on image number 24 series 2. There is also a 1.1 cm exophytic probable cyst measuring up to 14 Hounsfield units in density arising from the upper pole of the left kidney. There is also a 9 mm exophytic probable cyst measuring up to 15 Hounsfield units in density arising from the lower pole of the right kidney. Normal appearing ureters, urinary bladder and adrenal glands. No urinary tract calculi or hydronephrosis. Stomach/Bowel: Large number of sigmoid and descending colon diverticula. Normal appearing appendix, stomach and small bowel with the exception of an elongated fat density mass in a loop of jejunum in the left mid abdomen, measuring 2.7 x 1.0 cm in maximum dimensions on image number 64 series 5. Vascular/Lymphatic: Atheromatous arterial calcifications without aneurysm. No enlarged lymph nodes. Reproductive: Status post hysterectomy. No adnexal masses. Other: No abdominal wall hernia or abnormality. No abdominopelvic ascites. Musculoskeletal: Approximately 70% L1 vertebral compression deformity with mild bony retropulsion and no acute fracture lines visualized. There is also an  approximately 25% T11 vertebral body superior endplate compression deformity with mild Schmorl's node formation and no acute fracture lines. Facet degenerative changes in the mid and lower lumbar spine with associated grade 1 anterolisthesis at the L4-5 level. No pars defects. Mild to moderate anterior spur formation at multiple levels of the lumbar and lower thoracic spine. IMPRESSION: 1. No urinary tract calculi or hydronephrosis. 2. Small probable mildly complicated cysts arising from the upper pole of the left kidney and lower pole of the right kidney, not seen at previous ultrasound. A repeat right renal ultrasound is recommended to help determine if these are cystic or solid. 3. Pulmonary vascular congestion. 4. Minimal pericardial effusion. 5. Atheromatous arterial calcifications, including the coronary arteries. 6. Extensive sigmoid and descending colon diverticulosis. 7. Old T11 and L1 vertebral compression fractures. 8. 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 Electronically Signed   By: Claudie Revering M.D.   On: 10/18/2017 11:09    Review of Systems  Constitutional: Positive for malaise/fatigue.  HENT: Positive for congestion.   Eyes: Negative.   Respiratory: Positive for shortness of breath.   Cardiovascular: Positive for palpitations, orthopnea and PND.  Gastrointestinal: Negative.   Genitourinary: Negative.   Musculoskeletal: Positive for back pain, falls and myalgias.  Skin: Negative.   Neurological: Positive for dizziness.  Endo/Heme/Allergies: Negative.   Psychiatric/Behavioral: Negative.    Blood pressure (!) 83/53, pulse 71, temperature 98.3 F (36.8 C), temperature source Oral, resp. rate 18, height 5' 3"  (1.6 m), weight 109 lb 4.8 oz (49.6 kg), SpO2 93 %. Physical Exam  Nursing note and vitals reviewed. Constitutional: She is oriented to person, place, and time. She appears well-developed and well-nourished.  HENT:  Head:  Normocephalic and atraumatic.  Eyes: Pupils are equal, round, and reactive to light. Conjunctivae and EOM are normal.  Neck: Normal range of motion. Neck supple.  Cardiovascular: Normal rate, regular rhythm and normal heart sounds.  Respiratory: Effort normal. She has decreased breath sounds. She has rhonchi.  GI: Soft. Bowel sounds are normal.  Musculoskeletal: Normal range of motion.  Neurological: She is alert and oriented to person, place, and time. She has normal reflexes.  Skin: Skin is warm and dry.  Psychiatric: She has a normal mood and affect.    Assessment/Plan: Hypokalemia Shortness of breath dyspnea COPD Congestion Elevated troponins Hypertension Anxiety Smoking Falls Hyperlipidemia Depression GERD . Plan Hypokalemia recommend correct electrolytes Gentle hydration for dehydration hold Lasix for now Hypoxemia add supplemental oxygen therapy Emphysema COPD recommend inhalers supplemental oxygen consider steroids Demand ischemia related to borderline troponins Flank pain possibly related to renal cyst consider ultrasound as outpatient Compression fractures appeared to be old from previous falls continue conservative therapy follow-up with orthopedics Consider echocardiogram for shortness of breath symptoms Follow-up with cardiology 1-2 weeks upon discharge  Tamaria Dunleavy D Trannie Bardales 10/19/2017, 3:06 PM

## 2017-10-19 NOTE — Progress Notes (Signed)
PT Cancellation Note  Patient Details Name: Brittania Sudbeck MRN: [id number] DOB: [birth year]   Cancelled Treatment:    Reason Eval/Treat Not Completed: Medical issues which prohibited therapy. Per chart review, patient is awaiting MRI of the lumbar spine to evaluate compression fractures/possibility of kyphoplasty. Will hold PT evaluation until MRI is completed.    Bertram Denver, PT, DPT, CWCE 10/19/2017, 9:19 AM

## 2017-10-19 NOTE — Progress Notes (Signed)
Pt BP was 83/53. Notify docotr Gouru. Schedule lisinorpil was not given for 1000. Will continue to monitor.

## 2017-10-19 NOTE — Progress Notes (Signed)
Pt was offered soap sud enema to help with constipation, but refused  as this time. Will continue to monitor.

## 2017-10-19 NOTE — Progress Notes (Signed)
Metoprolol held due to BP 83/65. MD Jannifer Franklin made aware.

## 2017-10-19 NOTE — Plan of Care (Signed)
  Problem: Clinical Measurements: Goal: Ability to maintain clinical measurements within normal limits will improve 10/19/2017 1532 by Liliane Channel, RN Outcome: Progressing 10/19/2017 1532 by Liliane Channel, RN Outcome: Progressing Goal: Will remain free from infection 10/19/2017 1532 by Liliane Channel, RN Outcome: Progressing 10/19/2017 1532 by Liliane Channel, RN Outcome: Progressing Goal: Respiratory complications will improve 10/19/2017 1532 by Liliane Channel, RN Outcome: Progressing 10/19/2017 1532 by Liliane Channel, RN Outcome: Progressing   Problem: Activity: Goal: Risk for activity intolerance will decrease Outcome: Progressing   Problem: Coping: Goal: Level of anxiety will decrease Outcome: Progressing   Problem: Pain Managment: Goal: General experience of comfort will improve 10/19/2017 1532 by Liliane Channel, RN Outcome: Progressing 10/19/2017 1532 by Liliane Channel, RN Outcome: Progressing   Problem: Safety: Goal: Ability to remain free from injury will improve 10/19/2017 1532 by Liliane Channel, RN Outcome: Progressing 10/19/2017 1532 by Liliane Channel, RN Outcome: Progressing

## 2017-10-19 NOTE — Progress Notes (Signed)
[facility] [place] NAME: [name] [name]    MR#:  [id number]  DATE OF BIRTH:  Jan 24, 1946  SUBJECTIVE:  CHIEF COMPLAINT: Patient is reporting lower abdominal pain constipated and I did not have a bowel movement for the last 4-5 days.  Reporting generalized back pain, daughter at bedside.  REVIEW OF SYSTEMS:  CONSTITUTIONAL: No fever, fatigue or weakness.  EYES: No blurred or double vision.  EARS, NOSE, AND THROAT: No tinnitus or ear pain.  RESPIRATORY: No cough, shortness of breath, wheezing or hemoptysis.  CARDIOVASCULAR: No chest pain, orthopnea, edema.  GASTROINTESTINAL: No nausea, vomiting, diarrhea.  Reporting constipation and lower abdominal pain GENITOURINARY: No dysuria, hematuria.  ENDOCRINE: No polyuria, nocturia,  HEMATOLOGY: No anemia, easy bruising or bleeding SKIN: No rash or lesion. MUSCULOSKELETAL: No joint pain or arthritis.   NEUROLOGIC: No tingling, numbness, weakness.  PSYCHIATRY: No anxiety or depression.   DRUG ALLERGIES:  No Known Allergies  VITALS:  Blood pressure (!) 83/53, pulse 71, temperature 98.3 F (36.8 C), temperature source Oral, resp. rate 18, height 5\' 3"  (1.6 m), weight 49.6 kg (109 lb 4.8 oz), SpO2 93 %.  PHYSICAL EXAMINATION:  GENERAL:  72 y.o.-year-old patient lying in the bed with no acute distress.  EYES: Pupils equal, round, reactive to light and accommodation. No scleral icterus. Extraocular muscles intact.  HEENT: Head atraumatic, normocephalic. Oropharynx and nasopharynx clear.  NECK:  Supple, no jugular venous distention. No thyroid enlargement, no tenderness.  LUNGS: Normal breath sounds bilaterally, no wheezing, rales,rhonchi or crepitation. No use of accessory muscles of respiration.  CARDIOVASCULAR: S1, S2 normal. No murmurs, rubs, or gallops.  ABDOMEN: Soft, nontender, no rebound tenderness nondistended. Bowel sounds present.  EXTREMITIES: No pedal edema, cyanosis, or clubbing.   NEUROLOGIC: Cranial nerves II through XII are intact. Muscle strength generalized weakness in all extremities. Sensation intact. Gait not checked.  Diffuse tenderness in the lower back PSYCHIATRIC: The patient is alert and oriented x 3.  SKIN: No obvious rash, lesion, or ulcer.    LABORATORY PANEL:   CBC Recent Labs  Lab 10/19/17 0415  WBC 6.3  HGB 12.7  HCT 38.1  PLT 214   ------------------------------------------------------------------------------------------------------------------  Chemistries  Recent Labs  Lab 10/18/17 0958 10/19/17 0415  NA 137 139  K 2.5* 3.3*  CL 88* 100*  CO2 30 30  GLUCOSE 130* 94  BUN 22* 25*  CREATININE 1.20* 1.43*  CALCIUM 9.3 8.2*  AST 27  --   ALT 11*  --   ALKPHOS 99  --   BILITOT 1.1  --    ------------------------------------------------------------------------------------------------------------------  Cardiac Enzymes Recent Labs  Lab 10/19/17 0415  TROPONINI 0.07*   ------------------------------------------------------------------------------------------------------------------  RADIOLOGY:  Dg Chest 2 View  Result Date: 10/18/2017 CLINICAL DATA:  Cough.  Intermittent low-grade fever. EXAM: CHEST - 2 VIEW COMPARISON:  Chest x-ray dated February 09, 2015. FINDINGS: Borderline cardiomegaly, unchanged. Normal pulmonary vascularity. Atherosclerotic calcification of the aortic arch. The lungs remain mildly hyperinflated. No focal consolidation, pleural effusion, or pneumothorax. No acute osseous abnormality. Old left-sided rib fractures. IMPRESSION: COPD.  No active cardiopulmonary disease. Electronically Signed   By: Titus Dubin M.D.   On: 10/18/2017 10:34   Ct Angio Chest Pe W And/or Wo Contrast  Result Date: 10/18/2017 CLINICAL DATA:  Cough for 2 weeks EXAM: CT ANGIOGRAPHY CHEST WITH CONTRAST TECHNIQUE: Multidetector CT imaging of the chest was performed using the standard protocol during bolus administration of intravenous  contrast. Multiplanar CT image  reconstructions and MIPs were obtained to evaluate the vascular anatomy. CONTRAST:  51mL ISOVUE-370 IOPAMIDOL (ISOVUE-370) INJECTION 76% COMPARISON:  04/03/2011 FINDINGS: Cardiovascular: There are no filling defects in the pulmonary arterial tree to suggest acute pulmonary thromboembolism. Atherosclerotic calcifications of the thoracic aorta are noted. Maximal diameter of the ascending aorta is 3.4 cm. Maximal diameter of the descending aorta is 4.0 cm. Atherosclerotic calcifications in the great vessels are noted. There is no obvious dissection or intramural hematoma. Atherosclerotic changes in the aorta continuing the abdomen with irregular calcified plaque. Moderate 3 vessel coronary artery calcifications. Extensive hypertrophy of the left ventricle myocardium. Mitral valve and annular calcifications are present. Mediastinum/Nodes: No pericardial effusion. Thyroid is atrophic. No abnormal mediastinal adenopathy. Unremarkable esophagus. Lungs/Pleura: Severe emphysema dependent atelectasis at the lung bases. No pneumothorax. No pleural effusion. Musculoskeletal: New T11 and L1 compression fractures compared with a lateral radiograph dated 02/09/2015. At T11, the superior endplate compression results in 10% loss of height anteriorly. At L1, there is severe height loss and some retropulsion of the superior endplate. Review of the MIP images confirms the above findings. IMPRESSION: No acute pulmonary thromboembolism. 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 Left ventricular hypertrophy. T11 and L1 compression fractures. Aortic Atherosclerosis (ICD10-I70.0) and Emphysema (ICD10-J43.9). Electronically Signed   By: Marybelle Killings M.D.   On: 10/18/2017 12:10   Mr Lumbar Spine Wo Contrast  Result Date: 10/19/2017 CLINICAL DATA:  Low back pain following injury. EXAM: MRI LUMBAR SPINE WITHOUT CONTRAST TECHNIQUE: Multiplanar, multisequence MR imaging of the  lumbar spine was performed. No intravenous contrast was administered. COMPARISON:  CT abdomen pelvis 10/18/2017. FINDINGS: Segmentation: Conventional anatomy assumed, with the last open disc space designated L5-S1. Alignment: Similar to recent CT with a mild convex left scoliosis. There is a degenerative grade 1 anterolisthesis at L4-5. Vertebrae: There is an old healed mild superior endplate compression deformity at T11. A more severe biconcave compression fracture at L1 is associated with 3 mm of osseous retropulsion and mild residual marrow edema. The posterior elements are intact. No evidence of acute fracture or pars defect. The visualized sacroiliac joints appear unremarkable. Conus medullaris: Extends to the L1 level and appears normal. Fatty filum noted. Paraspinal and other soft tissues: Small left renal cysts are stable. Disc levels: Sagittal images demonstrate mild disc degeneration from T10-11 through T12-L1. No resulting spinal stenosis or nerve root encroachment. L1-2: Disc height and hydration are largely maintained. There is mild disc bulging. No spinal stenosis or nerve root encroachment. L2-3: Mild disc bulging eccentric to the left with mild facet and ligamentous hypertrophy. Mild spinal stenosis. No nerve root encroachment. L3-4: Mild disc bulging, facet and ligamentous hypertrophy. Mild spinal stenosis. No nerve root encroachment. L4-5: There is loss of disc height with annular disc bulging eccentric to the right. There is advanced facet hypertrophy accounting for the grade 1 anterolisthesis. These factors contribute to moderate spinal stenosis with asymmetric narrowing of the right lateral recess and right foramen. There is possible encroachment on the right L4 and L5 nerve roots. L5-S1: Disc height and hydration are maintained. Mild facet hypertrophy, worse on the left. No significant spinal stenosis or nerve root encroachment. IMPRESSION: 1. Largely healed L1 biconcave compression fracture  with osseous retropulsion and mild residual marrow edema. Old healed T11 superior endplate compression deformity. No acute osseous findings. 2. Moderate multifactorial spinal stenosis at L4-5, secondary to annular disc bulging, facet hypertrophy and resulting grade 1 anterolisthesis. There is asymmetric narrowing of the right lateral recess  and right foramen with possible encroachment on the right L4 and L5 nerve roots. 3. Mild multifactorial spinal stenosis at L2-3 and L3-4 without nerve root encroachment. Electronically Signed   By: Richardean Sale M.D.   On: 10/19/2017 12:48   Ct Renal Stone Study  Result Date: 10/18/2017 CLINICAL DATA:  Left flank pain for the past 5 days. EXAM: CT ABDOMEN AND PELVIS WITHOUT CONTRAST TECHNIQUE: Multidetector CT imaging of the abdomen and pelvis was performed following the standard protocol without IV contrast. COMPARISON:  Abdomen ultrasound dated 12/08/2013. FINDINGS: Lower chest: Prominent pulmonary vasculature at the lung bases. Minimal pericardial fluid with a maximum thickness of 6 mm. No pleural fluid. Coronary artery calcifications. Dense mitral valve annulus calcifications. Hepatobiliary: No focal liver abnormality is seen. No gallstones, gallbladder wall thickening, or biliary dilatation. Pancreas: Diffuse pancreatic atrophy. Spleen: Normal in size without focal abnormality. Adrenals/Urinary Tract: The previously demonstrated 2.0 cm septated cyst in the midpole of the left kidney is grossly stable, measuring 1.9 x 0.9 cm on image number 24 series 2. There is also a 1.1 cm exophytic probable cyst measuring up to 14 Hounsfield units in density arising from the upper pole of the left kidney. There is also a 9 mm exophytic probable cyst measuring up to 15 Hounsfield units in density arising from the lower pole of the right kidney. Normal appearing ureters, urinary bladder and adrenal glands. No urinary tract calculi or hydronephrosis. Stomach/Bowel: Large number of  sigmoid and descending colon diverticula. Normal appearing appendix, stomach and small bowel with the exception of an elongated fat density mass in a loop of jejunum in the left mid abdomen, measuring 2.7 x 1.0 cm in maximum dimensions on image number 64 series 5. Vascular/Lymphatic: Atheromatous arterial calcifications without aneurysm. No enlarged lymph nodes. Reproductive: Status post hysterectomy. No adnexal masses. Other: No abdominal wall hernia or abnormality. No abdominopelvic ascites. Musculoskeletal: Approximately 70% L1 vertebral compression deformity with mild bony retropulsion and no acute fracture lines visualized. There is also an approximately 25% T11 vertebral body superior endplate compression deformity with mild Schmorl's node formation and no acute fracture lines. Facet degenerative changes in the mid and lower lumbar spine with associated grade 1 anterolisthesis at the L4-5 level. No pars defects. Mild to moderate anterior spur formation at multiple levels of the lumbar and lower thoracic spine. IMPRESSION: 1. No urinary tract calculi or hydronephrosis. 2. Small probable mildly complicated cysts arising from the upper pole of the left kidney and lower pole of the right kidney, not seen at previous ultrasound. A repeat right renal ultrasound is recommended to help determine if these are cystic or solid. 3. Pulmonary vascular congestion. 4. Minimal pericardial effusion. 5. Atheromatous arterial calcifications, including the coronary arteries. 6. Extensive sigmoid and descending colon diverticulosis. 7. Old T11 and L1 vertebral compression fractures. 8. 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 Electronically Signed   By: Claudie Revering M.D.   On: 10/18/2017 11:09    EKG:   Orders placed or performed during the hospital encounter of 10/18/17  . ED EKG  . ED EKG  . EKG 12-Lead  . EKG 12-Lead  . ED EKG  . ED EKG  . EKG 12-Lead  . EKG 12-Lead     ASSESSMENT AND PLAN:    72 year old female patient with history of emphysema, coronary artery disease, anxiety disorder, hyperlipidemia, hypertension presented to the emergency room with flank pain and low oxygen level.  1.  Hypokalemia  Replace potassium po She was given IV potassium supplements.   Potassium trended up from 2.5-3.3  2 dehydration. IV fluid hydration  given Hold Lasix for now  3.  Hypoxia Chest x-ray no acute findings CT angiogram negative for acute pulmonary embolism  4.  Elevated troponin secondary to demand ischemia Cycle troponin-0.06-0.07-0.07  5.   Lower abdominal pain diffuse probably secondary to   Constipation Urinalysis no abnormal findings  Senokot, Dulcolax suppository and soapsuds enema if needed  UA looks normal  6.    Acute on chronic low back pain secondary to compression fractures  MRI of the lumbar spine with no acute findings but reveals Largely healed L1 biconcave compression fracture with osseous retropulsion and mild residual marrow edema. Old healed T11 superior endplate compression deformity. No acute osseous findings. 2. Moderate multifactorial spinal stenosis at L4-5, secondary to annular disc bulging, facet hypertrophy and resulting grade 1 anterolisthesis. There is asymmetric narrowing of the right lateral recess and right foramen with possible encroachment on the right L4 and L5 nerve roots. 3. Mild multifactorial spinal stenosis at L2-3 and L3-4 without nerve root encroachment Consult placed to Dr. Rudene Christians for possible kyphoplasty  7.  Descending thoracic aortic aneurysm Periodic imaging as outpatient by primary care physician      All the records are reviewed and case discussed with Care Management/Social Workerr. Management plans discussed with the patient, daughter at bedside and they are in agreement.  CODE STATUS: fc  TOTAL TIME TAKING CARE OF THIS PATIENT: 35 minutes.   POSSIBLE D/C IN 36 DAYS, DEPENDING ON  CLINICAL CONDITION.  Note: This dictation was prepared with Dragon dictation along with smaller phrase technology. Any transcriptional errors that result from this process are unintentional.   Nicholes Mango M.D on 10/19/2017 at 2:31 PM  Between 7am to 6pm - Pager - [phone] After 6pm go to [email or web address] - password [email or web address] [facility]  Office  [phone]  CC: Primary care physician; Mar Daring, PA-C

## 2017-10-19 NOTE — Progress Notes (Signed)
ANTICOAGULATION CONSULT NOTE - Initial Consult  Pharmacy Consult for Lovenox dosing Indication: chest pain/ACS  No Known Allergies  Patient Measurements: Height: 5\' 3"  (160 cm) Weight: 117 lb 11.2 oz (53.4 kg) IBW/kg (Calculated) : 52.4 Enoxaparin Dosing Weight: 53 kg  Vital Signs: Temp: 97.8 F (36.6 C) (03/23 1933) Temp Source: Oral (03/23 1933) BP: 83/65 (03/23 2114) Pulse Rate: 85 (03/23 2114)  Labs: Recent Labs    10/18/17 0958 10/18/17 1636 10/18/17 2111  HGB 15.3  --   --   HCT 46.1  --   --   PLT 286  --   --   CREATININE 1.20*  --   --   TROPONINI 0.06* 0.06* 0.07*    Estimated Creatinine Clearance: 35.6 mL/min (A) (by C-G formula based on SCr of 1.2 mg/dL (H)).   Medical History: Past Medical History:  Diagnosis Date  . Anxiety   . CAD (coronary artery disease) unk  . COPD (chronic obstructive pulmonary disease) ([place])   . Depression   . Hypercholesteremia unk  . Hypertension     Medications:  No anticoag in PTA meds  Assessment: Trop 0.07  Goal of Therapy:  Monitor platelets by anticoagulation protocol: Yes   Plan:  Lovenox 1 mg/kg q 12 hours ordered. F/u labs per protocol.  Nakisha Chai S 10/19/2017,12:07 AM

## 2017-10-20 LAB — BASIC METABOLIC PANEL
Anion gap: 10 (ref 5–15)
BUN: 21 mg/dL — ABNORMAL HIGH (ref 6–20)
CHLORIDE: 102 mmol/L (ref 101–111)
CO2: 26 mmol/L (ref 22–32)
CREATININE: 0.92 mg/dL (ref 0.44–1.00)
Calcium: 8.6 mg/dL — ABNORMAL LOW (ref 8.9–10.3)
GFR calc non Af Amer: >60 mL/min (ref >60)
GLUCOSE: 111 mg/dL — AB (ref 65–99)
Potassium: 3 mmol/L — ABNORMAL LOW (ref 3.5–5.1)
Sodium: 138 mmol/L (ref 135–145)

## 2017-10-20 LAB — CBC
HEMATOCRIT: 35.7 % (ref 35.0–47.0)
Hemoglobin: 11.7 g/dL — ABNORMAL LOW (ref 12.0–16.0)
MCH: 32 pg (ref 26.0–34.0)
MCHC: 32.7 g/dL (ref 32.0–36.0)
MCV: 97.9 fL (ref 80.0–100.0)
Platelets: 199 10*3/uL (ref 150–440)
RBC: 3.65 MIL/uL — ABNORMAL LOW (ref 3.80–5.20)
RDW: 13.4 % (ref 11.5–14.5)
WBC: 6.9 10*3/uL (ref 3.6–11.0)

## 2017-10-20 LAB — MAGNESIUM: Magnesium: 1.4 mg/dL — ABNORMAL LOW (ref 1.7–2.4)

## 2017-10-20 MED ORDER — MAGNESIUM SULFATE 2 GM/50ML IV SOLN
2.0000 g | Freq: Once | INTRAVENOUS | Status: AC
  Administered 2017-10-20: 2 g via INTRAVENOUS
  Filled 2017-10-20: qty 50

## 2017-10-20 MED ORDER — POTASSIUM CHLORIDE CRYS ER 20 MEQ PO TBCR
40.0000 meq | EXTENDED_RELEASE_TABLET | ORAL | Status: AC
  Administered 2017-10-20 (×2): 40 meq via ORAL
  Filled 2017-10-20 (×2): qty 2

## 2017-10-20 MED ORDER — SALINE SPRAY 0.65 % NA SOLN
1.0000 | NASAL | Status: DC | PRN
  Administered 2017-10-20: 1 via NASAL
  Filled 2017-10-20: qty 44

## 2017-10-20 MED ORDER — SODIUM CHLORIDE 0.9% FLUSH
3.0000 mL | Freq: Two times a day (BID) | INTRAVENOUS | Status: DC
  Administered 2017-10-20 – 2017-10-24 (×8): 3 mL via INTRAVENOUS

## 2017-10-20 MED ORDER — SODIUM CHLORIDE 0.9% FLUSH
3.0000 mL | Freq: Two times a day (BID) | INTRAVENOUS | Status: DC
  Administered 2017-10-20 – 2017-10-22 (×5): 3 mL via INTRAVENOUS

## 2017-10-20 MED ORDER — SODIUM CHLORIDE 0.9% FLUSH: 3.0000 mL | INTRAVENOUS | Status: DC | PRN

## 2017-10-20 NOTE — Consult Note (Signed)
Patient is a 72 year old who suffered injury to her back about a week and a half ago when she self on a step stool and came off of it hard.  She has a severe low back pain and x-rays and MRI show L1 compression fracture without neurologic deficit.  She has severe pain and is very limited in her mobility.  On exam she is tender with a kyphotic deformity at L1 tender over the L1 vertebral spinous process with percussion No clonus  Grafts were reviewed and there is a biconcave compression at L1 with significant edema consistent with acute to subacute injury  imPression is back pain secondary to L1 compression fracture Plan is for kyphoplasty tomorrow

## 2017-10-20 NOTE — Care Management (Signed)
For kyphoplasty 3/26.  Order present for physical therapy after procedure.  Discharge disposition will be determined after procedure and therapy evaluation

## 2017-10-20 NOTE — Progress Notes (Signed)
PT Cancellation Note  Patient Details Name: Meryn Sarracino MRN: [id number] DOB: 05/20/1946   Cancelled Treatment:    Reason Eval/Treat Not Completed: Medical issues which prohibited therapy; Pt is scheduled for lumbar kyphoplasty 10/21/17.  Will complete PT orders at this time and will assess patient upon receipt of new PT orders to eval and treat.   Linus Salmons PT, DPT 10/20/17, 8:43 AM

## 2017-10-20 NOTE — Progress Notes (Signed)
Pharmacy Electrolyte Monitoring Consult:  Pharmacy consulted to assist in monitoring and replacing electrolytes in this 72 y.o. female admitted on 10/18/2017 with Flank Pain   Labs:  Sodium (mmol/L)  Date Value  10/20/2017 138  09/26/2017 135  11/04/2013 131 (L)   Potassium (mmol/L)  Date Value  10/20/2017 3.0 (L)  11/04/2013 4.0   Magnesium (mg/dL)  Date Value  10/20/2017 1.4 (L)   Calcium (mg/dL)  Date Value  10/20/2017 8.6 (L)   Calcium, Total (mg/dL)  Date Value  11/04/2013 8.8   Albumin (g/dL)  Date Value  10/18/2017 4.3  09/26/2017 4.1    Plan: K= 3.0  Mag= 1.4 MD has ordered KCL PO 40 meq q4h x 2 doses and Magnesuim sulfate 2 gram IV x 1. Will f/u with am labs   Nanda Bittick A 10/20/2017 9:14 AM

## 2017-10-20 NOTE — Progress Notes (Signed)
[facility] [place] NAME: [name] [name]    MR#:  [id number]  DATE OF BIRTH:  03-13-1946  SUBJECTIVE:  CHIEF COMPLAINT: Patient is stillreporting lower abdominal pain constipated and  did not have a bowel movement for the last 4-5 days. For kyphoplasty tomorrow, daughter at bedside.  REVIEW OF SYSTEMS:  CONSTITUTIONAL: No fever, fatigue or weakness.  EYES: No blurred or double vision.  EARS, NOSE, AND THROAT: No tinnitus or ear pain.  RESPIRATORY: No cough, shortness of breath, wheezing or hemoptysis.  CARDIOVASCULAR: No chest pain, orthopnea, edema.  GASTROINTESTINAL: No nausea, vomiting, diarrhea.  Reporting constipation and lower abdominal pain GENITOURINARY: No dysuria, hematuria.  ENDOCRINE: No polyuria, nocturia,  HEMATOLOGY: No anemia, easy bruising or bleeding SKIN: No rash or lesion. MUSCULOSKELETAL: No joint pain or arthritis.   NEUROLOGIC: No tingling, numbness, weakness.  PSYCHIATRY: No anxiety or depression.   DRUG ALLERGIES:  No Known Allergies  VITALS:  Blood pressure 137/63, pulse 90, temperature 98.5 F (36.9 C), temperature source Oral, resp. rate 17, height 5\' 3"  (1.6 m), weight 49.6 kg (109 lb 4.8 oz), SpO2 91 %.  PHYSICAL EXAMINATION:  GENERAL:  72 y.o.-year-old patient lying in the bed with no acute distress.  EYES: Pupils equal, round, reactive to light and accommodation. No scleral icterus. Extraocular muscles intact.  HEENT: Head atraumatic, normocephalic. Oropharynx and nasopharynx clear.  NECK:  Supple, no jugular venous distention. No thyroid enlargement, no tenderness.  LUNGS: Normal breath sounds bilaterally, no wheezing, rales,rhonchi or crepitation. No use of accessory muscles of respiration.  CARDIOVASCULAR: S1, S2 normal. No murmurs, rubs, or gallops.  ABDOMEN: Soft, nontender, no rebound tenderness nondistended. Bowel sounds present.  EXTREMITIES: No pedal edema, cyanosis, or clubbing.   NEUROLOGIC: Cranial nerves II through XII are intact. Muscle strength generalized weakness in all extremities. Sensation intact. Gait not checked.  Diffuse tenderness in the lower back PSYCHIATRIC: The patient is alert and oriented x 3.  SKIN: No obvious rash, lesion, or ulcer.    LABORATORY PANEL:   CBC Recent Labs  Lab 10/20/17 0318  WBC 6.9  HGB 11.7*  HCT 35.7  PLT 199   ------------------------------------------------------------------------------------------------------------------  Chemistries  Recent Labs  Lab 10/18/17 0958  10/20/17 0318  NA 137   < > 138  K 2.5*   < > 3.0*  CL 88*   < > 102  CO2 30   < > 26  GLUCOSE 130*   < > 111*  BUN 22*   < > 21*  CREATININE 1.20*   < > 0.92  CALCIUM 9.3   < > 8.6*  MG  --   --  1.4*  AST 27  --   --   ALT 11*  --   --   ALKPHOS 99  --   --   BILITOT 1.1  --   --    < > = values in this interval not displayed.   ------------------------------------------------------------------------------------------------------------------  Cardiac Enzymes Recent Labs  Lab 10/19/17 0415  TROPONINI 0.07*   ------------------------------------------------------------------------------------------------------------------  RADIOLOGY:  Dg Chest 2 View  Result Date: 10/19/2017 CLINICAL DATA:  Cough for several days, fever, COPD, coronary artery disease, smoker EXAM: CHEST - 2 VIEW COMPARISON:  10/18/2017 FINDINGS: Enlargement of cardiac silhouette with pulmonary vascular congestion. Atherosclerotic calcification aorta. New bibasilar atelectasis. Accentuation of interstitial markings since previous exam could be related to mild hypoinflation or developing mild interstitial infiltrate/edema. Questionable small RIGHT pleural effusion. No pneumothorax. Bones demineralized  with chronic compression deformity of L1 vertebral body. IMPRESSION: Enlargement of cardiac silhouette with pulmonary vascular congestion. Bibasilar atelectasis with  increased interstitial prominence question due to hypoinflation or interstitial infiltrate/edema. Electronically Signed   By: Lavonia Dana M.D.   On: 10/19/2017 16:19   Mr Lumbar Spine Wo Contrast  Result Date: 10/19/2017 CLINICAL DATA:  Low back pain following injury. EXAM: MRI LUMBAR SPINE WITHOUT CONTRAST TECHNIQUE: Multiplanar, multisequence MR imaging of the lumbar spine was performed. No intravenous contrast was administered. COMPARISON:  CT abdomen pelvis 10/18/2017. FINDINGS: Segmentation: Conventional anatomy assumed, with the last open disc space designated L5-S1. Alignment: Similar to recent CT with a mild convex left scoliosis. There is a degenerative grade 1 anterolisthesis at L4-5. Vertebrae: There is an old healed mild superior endplate compression deformity at T11. A more severe biconcave compression fracture at L1 is associated with 3 mm of osseous retropulsion and mild residual marrow edema. The posterior elements are intact. No evidence of acute fracture or pars defect. The visualized sacroiliac joints appear unremarkable. Conus medullaris: Extends to the L1 level and appears normal. Fatty filum noted. Paraspinal and other soft tissues: Small left renal cysts are stable. Disc levels: Sagittal images demonstrate mild disc degeneration from T10-11 through T12-L1. No resulting spinal stenosis or nerve root encroachment. L1-2: Disc height and hydration are largely maintained. There is mild disc bulging. No spinal stenosis or nerve root encroachment. L2-3: Mild disc bulging eccentric to the left with mild facet and ligamentous hypertrophy. Mild spinal stenosis. No nerve root encroachment. L3-4: Mild disc bulging, facet and ligamentous hypertrophy. Mild spinal stenosis. No nerve root encroachment. L4-5: There is loss of disc height with annular disc bulging eccentric to the right. There is advanced facet hypertrophy accounting for the grade 1 anterolisthesis. These factors contribute to moderate  spinal stenosis with asymmetric narrowing of the right lateral recess and right foramen. There is possible encroachment on the right L4 and L5 nerve roots. L5-S1: Disc height and hydration are maintained. Mild facet hypertrophy, worse on the left. No significant spinal stenosis or nerve root encroachment. IMPRESSION: 1. Largely healed L1 biconcave compression fracture with osseous retropulsion and mild residual marrow edema. Old healed T11 superior endplate compression deformity. No acute osseous findings. 2. Moderate multifactorial spinal stenosis at L4-5, secondary to annular disc bulging, facet hypertrophy and resulting grade 1 anterolisthesis. There is asymmetric narrowing of the right lateral recess and right foramen with possible encroachment on the right L4 and L5 nerve roots. 3. Mild multifactorial spinal stenosis at L2-3 and L3-4 without nerve root encroachment. Electronically Signed   By: Richardean Sale M.D.   On: 10/19/2017 12:48    EKG:   Orders placed or performed during the hospital encounter of 10/18/17  . ED EKG  . ED EKG  . EKG 12-Lead  . EKG 12-Lead  . ED EKG  . ED EKG  . EKG 12-Lead  . EKG 12-Lead  . EKG    ASSESSMENT AND PLAN:    72 year old female patient with history of emphysema, coronary artery disease, anxiety disorder, hyperlipidemia, hypertension presented to the emergency room with flank pain and low oxygen level.  1.  Hypokalemia hypomagnesemia Replace potassium magnesium check a.m. labs  2 dehydration. IV fluid hydration  given Hold Lasix for now  3.  Hypoxia Chest x-ray no acute findings CT angiogram negative for acute pulmonary embolism  4.  Elevated troponin secondary to demand ischemia Cycle troponin-0.06-0.07-0.07  5.   Lower abdominal pain diffuse probably secondary to  Constipation Urinalysis no abnormal findings  Senokot, Dulcolax suppository and soapsuds enema if needed  UA looks normal  6.    Acute on chronic low back pain  secondary to compression fractures  MRI of the lumbar spine with no acute findings but reveals Largely healed L1 biconcave compression fracture with osseous retropulsion and mild residual marrow edema. Old healed T11 superior endplate compression deformity. No acute osseous findings. 2. Moderate multifactorial spinal stenosis at L4-5, secondary to annular disc bulging, facet hypertrophy and resulting grade 1 anterolisthesis. There is asymmetric narrowing of the right lateral recess and right foramen with possible encroachment on the right L4 and L5 nerve roots. 3. Mild multifactorial spinal stenosis at L2-3 and L3-4 without nerve root encroachment  Dr. Rudene Christians scheduled for  kyphoplasty  7.  Descending thoracic aortic aneurysm Periodic imaging as outpatient by primary care physician      All the records are reviewed and case discussed with Care Management/Social Workerr. Management plans discussed with the patient, daughter at bedside and they are in agreement.  CODE STATUS: fc  TOTAL TIME TAKING CARE OF THIS PATIENT: 35 minutes.   POSSIBLE D/C IN 36 DAYS, DEPENDING ON CLINICAL CONDITION.  Note: This dictation was prepared with Dragon dictation along with smaller phrase technology. Any transcriptional errors that result from this process are unintentional.   Nicholes Mango M.D on 10/20/2017 at 3:02 PM  Between 7am to 6pm - Pager - [phone] After 6pm go to [email or web address] - password [email or web address] [facility]  Office  [phone]  CC: Primary care physician; Mar Daring, PA-C

## 2017-10-20 NOTE — Progress Notes (Signed)
Patient examined and booklet on kyphoplasty given to patient and daughter.  We reviewed the procedure at length and reviewed with a bone model.  Additionally talk with her nurse was present and noted that the patient is on Eliquis will need to stop that and hold procedure until Thursday

## 2017-10-21 ENCOUNTER — Inpatient Hospital Stay: Admit: 2017-10-21 | Payer: Medicare Other

## 2017-10-21 LAB — BASIC METABOLIC PANEL
Anion gap: 9 (ref 5–15)
BUN: 13 mg/dL (ref 6–20)
CO2: 27 mmol/L (ref 22–32)
CREATININE: 0.79 mg/dL (ref 0.44–1.00)
Calcium: 8.8 mg/dL — ABNORMAL LOW (ref 8.9–10.3)
Chloride: 101 mmol/L (ref 101–111)
GFR calc Af Amer: >60 mL/min (ref >60)
Glucose, Bld: 108 mg/dL — ABNORMAL HIGH (ref 65–99)
POTASSIUM: 4 mmol/L (ref 3.5–5.1)
SODIUM: 137 mmol/L (ref 135–145)

## 2017-10-21 LAB — MAGNESIUM: MAGNESIUM: 1.5 mg/dL — AB (ref 1.7–2.4)

## 2017-10-21 MED ORDER — MAGNESIUM SULFATE 2 GM/50ML IV SOLN
2.0000 g | Freq: Once | INTRAVENOUS | Status: AC
  Administered 2017-10-21: 2 g via INTRAVENOUS
  Filled 2017-10-21: qty 50

## 2017-10-21 MED ORDER — METOPROLOL SUCCINATE ER 50 MG PO TB24
50.0000 mg | ORAL_TABLET | Freq: Every day | ORAL | Status: DC
  Administered 2017-10-21 – 2017-10-23 (×3): 50 mg via ORAL
  Filled 2017-10-21 (×3): qty 1

## 2017-10-21 MED ORDER — LISINOPRIL 20 MG PO TABS
40.0000 mg | ORAL_TABLET | Freq: Every day | ORAL | Status: DC
  Administered 2017-10-21 – 2017-10-23 (×3): 40 mg via ORAL
  Filled 2017-10-21 (×4): qty 2

## 2017-10-21 NOTE — Progress Notes (Signed)
Pain Assessment.  Patient doesn't seem able to use the 0 - 10 pain scale.  Explained it to thoroughly.  At shift change she said she had very little pain.  She scored it a 9 of 10.  Following the 1 tablet of Norco she said her back was only tingling, she scored it an eight of 10. Will try another scale going forward.

## 2017-10-21 NOTE — Plan of Care (Signed)
Slept at long intervals after prn pain med for back pain.

## 2017-10-21 NOTE — Progress Notes (Signed)
Pharmacy consulted for electrolyte replacement protocol:   Goal of therapy: Electrolytes within normal limits:  K 3.5 - 5.1 Corrected Ca 8.9 - 10.3 Phos 2.5 - 4.6 Mg 1.7 - 2.4   Assessment: Lab Results  Component Value Date   CREATININE 0.79 10/21/2017   BUN 13 10/21/2017   NA 137 10/21/2017   K 4.0 10/21/2017   CL 101 10/21/2017   CO2 27 10/21/2017  Mg 1.5   Plan: Magnesium sulfate 2gm iv x 1 dose, repeat labs tomorrow with AM labs.    Thomasenia Sales, PharmD, MBA, [facility]

## 2017-10-21 NOTE — Progress Notes (Signed)
Patient refuses to have an Echocardiogram because the tech is a female.  [name] [name] does not know when a female tech will be working.  Dr. Margaretmary Eddy notified

## 2017-10-21 NOTE — Evaluation (Signed)
Physical Therapy Evaluation Patient Details Name: [name] [name] MRN: [id number] DOB: 18-Feb-1946 Today's Date: 10/21/2017   History of Present Illness  Pt is a 72 year old female admitted for hypokalemia and scheduled for a kyphoplasty for L1 compression fracture 09/25/2017.  Pt arrived at the ED with c/o L lower back pain and presented as hypoxic upon arrival.   PMH includes Htn, hypercholestrolemia, depression, COPD, CAD and anxiety.  Clinical Impression  Pt is a 72 year old female who lives in a one story home alone.  She reports being independent with mobility prior to admission.  Pt is in bed with cannula lying by her side upon PT arrival.  PT replaced cannula and pt's O2 measured 79%.  PT educated pt concerning importance of use of O2 at this time and notified RN.  Pt appeared confused and anxious throughout evaluation.  Pt presented with generalized weakness of UE and LE.  Pt was able to perform bed mobility with increased time and use of bed rail.  Pt able to sit at EOB with bilateral UE and LE support.  Pt attempted STS but was unable to achieve upright posture due to pain increase in low back.  Pt will continue to benefit from skilled PT for strength, tolerance to activity, functional mobility and education for safe use of AD.    Follow Up Recommendations SNF    Equipment Recommendations  (Unclear as to whether pt has an AD at home at this point due to cognitive status.  If no RW available, pt will need one following surgery.)    Recommendations for Other Services       Precautions / Restrictions Precautions Precautions: Fall Restrictions Weight Bearing Restrictions: No      Mobility  Bed Mobility Overal bed mobility: Modified Independent             General bed mobility comments: Pt is able to slowly move to EOB but does not need physical assistance with HOB elevated.  Transfers Overall transfer level: Needs assistance Equipment used: Rolling walker (2  wheeled) Transfers: Sit to/from Stand Sit to Stand: Min assist         General transfer comment: PT provided education concerning safe hand placement and min physical assist to initiate STS.  Pt unable to complete transfer due to sharp pain increase.  Ambulation/Gait Ambulation/Gait assistance: (Unable to assess due to pain level.)              Stairs            Wheelchair Mobility    Modified Rankin (Stroke Patients Only)       Balance Overall balance assessment: Needs assistance Sitting-balance support: Bilateral upper extremity supported;Feet supported                                         Pertinent Vitals/Pain Pain Assessment: Faces Faces Pain Scale: Hurts even more Pain Location: Low back over area of compression fracture at L1 with standing. Pain Intervention(s): Limited activity within patient's tolerance;Monitored during session    [place] expects to be discharged to:: Private residence Living Arrangements: Alone Available Help at Discharge: Family;Available PRN/intermittently Type of Home: House Home Access: Level entry     Home Layout: One level        Prior Function Level of Independence: Independent         Comments: Pt reports to be  independent at baseline without AD.     Hand Dominance        Extremity/Trunk Assessment   Upper Extremity Assessment Upper Extremity Assessment: Generalized weakness    Lower Extremity Assessment Lower Extremity Assessment: Generalized weakness    Cervical / Trunk Assessment Cervical / Trunk Assessment: Kyphotic  Communication   Communication: Other (comment)(Pt's had removed O2 cannula and presented with O2: 79% upon PT arrival.  Pt appeared to be confused and RN was contacted.  Unclear as to how accurate pt's social hx is for this reason.)  Cognition Arousal/Alertness: Awake/alert Behavior During Therapy: Restless Overall Cognitive Status: No  family/caregiver present to determine baseline cognitive functioning Area of Impairment: Memory                     Memory: Decreased short-term memory         General Comments: Pt appears to be confused concerning order of events and situation in hospital.  Unclear as to if this is pt's baseline due to pt's O2 sats and no family being in room during evaluation.      General Comments      Exercises     Assessment/Plan    PT Assessment Patient needs continued PT services  PT Problem List Decreased strength;Decreased mobility;Decreased balance;Decreased knowledge of use of DME;Decreased activity tolerance;Decreased cognition;Cardiopulmonary status limiting activity;Decreased range of motion       PT Treatment Interventions DME instruction;Therapeutic activities;Gait training;Therapeutic exercise;Stair training;Balance training;Functional mobility training;Neuromuscular re-education;Patient/family education    PT Goals (Current goals can be found in the Care Plan section)  Acute Rehab PT Goals PT Goal Formulation: Patient unable to participate in goal setting    Frequency Min 2X/week   Barriers to discharge        Co-evaluation               AM-PAC PT "6 Clicks" Daily Activity  Outcome Measure Difficulty turning over in bed (including adjusting bedclothes, sheets and blankets)?: A Lot Difficulty moving from lying on back to sitting on the side of the bed? : A Lot Difficulty sitting down on and standing up from a chair with arms (e.g., wheelchair, bedside commode, etc,.)?: A Lot Help needed moving to and from a bed to chair (including a wheelchair)?: A Lot Help needed walking in hospital room?: A Lot Help needed climbing 3-5 steps with a railing? : Total 6 Click Score: 11    End of Session Equipment Utilized During Treatment: Gait belt;Oxygen Activity Tolerance: Patient limited by pain Patient left: in bed;with call bell/phone within reach;with bed alarm  set;with nursing/sitter in room Nurse Communication: Mobility status(Communicated 02 sat reading of 79% to RN as well as pt removing cannula herself.  Pt has been educated concerning importance of keeping O2 in place.) PT Visit Diagnosis: Muscle weakness (generalized) (M62.81);Unsteadiness on feet (R26.81);Pain Pain - part of body: (Low back)    Time: 1020-1050 PT Time Calculation (min) (ACUTE ONLY): 30 min   Charges:   PT Evaluation $PT Eval Moderate Complexity: 1 Mod     PT G Codes:   PT G-Codes **NOT FOR INPATIENT CLASS** Functional Assessment Tool Used: AM-PAC 6 Clicks Basic Mobility    Roxanne Gates, PT, DPT   Roxanne Gates 10/21/2017, 11:03 AM

## 2017-10-21 NOTE — Progress Notes (Addendum)
[facility] at [place] NAME: [name] [name]    MR#:  [id number]  DATE OF BIRTH:  02-06-1946  SUBJECTIVE:  CHIEF COMPLAINT: Patient had a very small bowel movement last night and still feels constipated  For kyphoplasty on Thursday Eliquis is on hold  REVIEW OF SYSTEMS:  CONSTITUTIONAL: No fever, fatigue or weakness.  EYES: No blurred or double vision.  EARS, NOSE, AND THROAT: No tinnitus or ear pain.  RESPIRATORY: No cough, shortness of breath, wheezing or hemoptysis.  CARDIOVASCULAR: No chest pain, orthopnea, edema.  GASTROINTESTINAL: No nausea, vomiting, diarrhea.  Reporting constipation and lower abdominal pain GENITOURINARY: No dysuria, hematuria.  ENDOCRINE: No polyuria, nocturia,  HEMATOLOGY: No anemia, easy bruising or bleeding SKIN: No rash or lesion. MUSCULOSKELETAL: No joint pain or arthritis.   NEUROLOGIC: No tingling, numbness, weakness.  PSYCHIATRY: No anxiety or depression.   DRUG ALLERGIES:  No Known Allergies  VITALS:  Blood pressure (!) 186/69, pulse 92, temperature 98.8 F (37.1 C), temperature source Oral, resp. rate 14, height 5\' 3"  (1.6 m), weight 49.6 kg (109 lb 4.8 oz), SpO2 93 %.  PHYSICAL EXAMINATION:  GENERAL:  72 y.o.-year-old patient lying in the bed with no acute distress.  EYES: Pupils equal, round, reactive to light and accommodation. No scleral icterus. Extraocular muscles intact.  HEENT: Head atraumatic, normocephalic. Oropharynx and nasopharynx clear.  NECK:  Supple, no jugular venous distention. No thyroid enlargement, no tenderness.  LUNGS: Normal breath sounds bilaterally, no wheezing, rales,rhonchi or crepitation. No use of accessory muscles of respiration.  CARDIOVASCULAR: S1, S2 normal. No murmurs, rubs, or gallops.  ABDOMEN: Soft, nontender, no rebound tenderness nondistended. Bowel sounds present.  EXTREMITIES: No pedal edema, cyanosis, or clubbing.  NEUROLOGIC: Cranial nerves II through  XII are intact. Muscle strength generalized weakness in all extremities. Sensation intact. Gait not checked.  Diffuse tenderness in the lower back PSYCHIATRIC: The patient is alert and oriented x 3.  SKIN: No obvious rash, lesion, or ulcer.    LABORATORY PANEL:   CBC Recent Labs  Lab 10/20/17 0318  WBC 6.9  HGB 11.7*  HCT 35.7  PLT 199   ------------------------------------------------------------------------------------------------------------------  Chemistries  Recent Labs  Lab 10/18/17 0958  10/21/17 0424  NA 137   < > 137  K 2.5*   < > 4.0  CL 88*   < > 101  CO2 30   < > 27  GLUCOSE 130*   < > 108*  BUN 22*   < > 13  CREATININE 1.20*   < > 0.79  CALCIUM 9.3   < > 8.8*  MG  --    < > 1.5*  AST 27  --   --   ALT 11*  --   --   ALKPHOS 99  --   --   BILITOT 1.1  --   --    < > = values in this interval not displayed.   ------------------------------------------------------------------------------------------------------------------  Cardiac Enzymes Recent Labs  Lab 10/19/17 0415  TROPONINI 0.07*   ------------------------------------------------------------------------------------------------------------------  RADIOLOGY:  No results found.  EKG:   Orders placed or performed during the hospital encounter of 10/18/17  . ED EKG  . ED EKG  . EKG 12-Lead  . EKG 12-Lead  . ED EKG  . ED EKG  . EKG 12-Lead  . EKG 12-Lead  . EKG    ASSESSMENT AND PLAN:    72 year old female patient with history of emphysema, coronary artery disease, anxiety disorder,  hyperlipidemia, hypertension presented to the emergency room with flank pain and low oxygen level.  1.  Hypokalemia hypomagnesemia Replace potassium magnesium check a.m. labs  2 dehydration. IV fluid hydration  given Hold Lasix for now  3.  Hypoxia Chest x-ray no acute findings CT angiogram negative for acute pulmonary embolism Check echocardiogram  4.  Elevated troponin secondary to demand  ischemia Cycle troponin-0.06-0.07-0.07  5.   Lower abdominal pain diffuse  secondary to   Constipation Improving.  Patient had a small bowel movement last night  Senokot, Dulcolax suppository and soapsuds enemax1 today UA looks normal  6.    Acute on chronic low back pain secondary to compression fractures  MRI of the lumbar spine with no acute findings but reveals Largely healed L1 biconcave compression fracture with osseous retropulsion and mild residual marrow edema. Old healed T11 superior endplate compression deformity. No acute osseous findings. 2. Moderate multifactorial spinal stenosis at L4-5, secondary to annular disc bulging, facet hypertrophy and resulting grade 1 anterolisthesis. There is asymmetric narrowing of the right lateral recess and right foramen with possible encroachment on the right L4 and L5 nerve roots. 3. Mild multifactorial spinal stenosis at L2-3 and L3-4 without nerve root encroachment  Dr. Rudene Christians scheduled pt for  kyphoplasty on Thursday Eliquis is on hold  7.  Descending thoracic aortic aneurysm Periodic imaging as outpatient by primary care physician      All the records are reviewed and case discussed with Care Management/Social Workerr. Management plans discussed with the patient, daughter at bedside and they are in agreement.  CODE STATUS: fc  TOTAL TIME TAKING CARE OF THIS PATIENT: 35 minutes.   POSSIBLE D/C IN 36 DAYS, DEPENDING ON CLINICAL CONDITION.  Note: This dictation was prepared with Dragon dictation along with smaller phrase technology. Any transcriptional errors that result from this process are unintentional.   Nicholes Mango M.D on 10/21/2017 at 3:05 PM  Between 7am to 6pm - Pager - [phone] After 6pm go to [email or web address] - password [email or web address] [facility]  Office  [phone]  CC: Primary care physician; Mar Daring, PA-C

## 2017-10-22 LAB — CBC
HEMATOCRIT: 39.6 % (ref 35.0–47.0)
HEMOGLOBIN: 13 g/dL (ref 12.0–16.0)
MCH: 31.9 pg (ref 26.0–34.0)
MCHC: 32.9 g/dL (ref 32.0–36.0)
MCV: 97.1 fL (ref 80.0–100.0)
Platelets: 216 10*3/uL (ref 150–440)
RBC: 4.07 MIL/uL (ref 3.80–5.20)
RDW: 13.7 % (ref 11.5–14.5)
WBC: 5.1 10*3/uL (ref 3.6–11.0)

## 2017-10-22 LAB — BASIC METABOLIC PANEL
Anion gap: 11 (ref 5–15)
BUN: 13 mg/dL (ref 6–20)
CHLORIDE: 98 mmol/L — AB (ref 101–111)
CO2: 27 mmol/L (ref 22–32)
Calcium: 8.8 mg/dL — ABNORMAL LOW (ref 8.9–10.3)
Creatinine, Ser: 0.8 mg/dL (ref 0.44–1.00)
GFR calc non Af Amer: >60 mL/min (ref >60)
Glucose, Bld: 112 mg/dL — ABNORMAL HIGH (ref 65–99)
POTASSIUM: 4.1 mmol/L (ref 3.5–5.1)
Sodium: 136 mmol/L (ref 135–145)

## 2017-10-22 LAB — MAGNESIUM: Magnesium: 1.7 mg/dL (ref 1.7–2.4)

## 2017-10-22 MED ORDER — ALUM & MAG HYDROXIDE-SIMETH 200-200-20 MG/5ML PO SUSP
30.0000 mL | Freq: Four times a day (QID) | ORAL | Status: DC | PRN
  Administered 2017-10-22: 30 mL via ORAL
  Filled 2017-10-22: qty 30

## 2017-10-22 MED ORDER — CEFAZOLIN (ANCEF) 1 G IV SOLR
1.0000 g | INTRAVENOUS | Status: AC
  Administered 2017-10-23: 1 g
  Filled 2017-10-22: qty 1

## 2017-10-22 MED ORDER — MAGNESIUM OXIDE 400 (241.3 MG) MG PO TABS
800.0000 mg | ORAL_TABLET | Freq: Once | ORAL | Status: AC
  Administered 2017-10-22: 800 mg via ORAL
  Filled 2017-10-22: qty 2

## 2017-10-22 MED ORDER — MEGESTROL ACETATE 400 MG/10ML PO SUSP
400.0000 mg | Freq: Every day | ORAL | Status: DC
  Administered 2017-10-22: 400 mg via ORAL
  Filled 2017-10-22 (×4): qty 10

## 2017-10-22 MED ORDER — MAGNESIUM SULFATE IN D5W 1-5 GM/100ML-% IV SOLN
1.0000 g | Freq: Once | INTRAVENOUS | Status: DC
  Filled 2017-10-22: qty 100

## 2017-10-22 NOTE — Progress Notes (Signed)
Pharmacy consulted for electrolyte replacement protocol:   Goal of therapy: Electrolytes within normal limits:  Assessment: Lab Results  Component Value Date   CREATININE 0.80 10/22/2017   BUN 13 10/22/2017   NA 136 10/22/2017   K 4.1 10/22/2017   CL 98 (L) 10/22/2017   CO2 27 10/22/2017  Mg 1.7   Plan: Will order Magnesium sulfate 1 gm iv x 1 dose, repeat labs on 3/29   Chinita Greenland PharmD Clinical Pharmacist 10/22/2017

## 2017-10-22 NOTE — Care Management Important Message (Signed)
Important Message  Patient Details  Name: [name] [name] MRN: [id number] Date of Birth: [birth year]   Medicare Important Message Given:  Yes Signed IM notice given    Katrina Stack, RN 10/22/2017, 5:06 PM

## 2017-10-22 NOTE — Plan of Care (Signed)
Back pain controlled with prn meds.  Pt voices understanding of pending NPO status after midnight and understanding of pending kyphoplasty.

## 2017-10-22 NOTE — Progress Notes (Signed)
Patient seen and procedure discussed.  She is uncertain about the procedure.  Discussed at length with patient and daughter. Recommend surgery tomorrow, orders placed, site marked. If she decides not to have procedure, recommend follow up with me in 3 weeks to reassess.

## 2017-10-22 NOTE — Progress Notes (Signed)
SATURATION QUALIFICATIONS: (This note is used to comply with regulatory documentation for home oxygen)  Patient Saturations on Room Air at Rest = 87  Patient Saturations on Room Air while Ambulating = 83%  Patient Saturations on 2 Liters of oxygen while Ambulating = 95%  Please briefly explain why patient needs home oxygen:Pt. Oxygen saturations drop relatively quickly on room air at rest, pt. Becomes dyspneic on room air with ambulation to [facility] and bathroom.

## 2017-10-22 NOTE — Progress Notes (Signed)
[place] at [facility]                                                                                                                                                                                  Patient Demographics   [name] [name], is a 72 y.o. female, DOB - February 19, 1946, UXN:[id number]  Admit date - 10/18/2017   Admitting Physician Saundra Shelling, MD  Outpatient Primary MD for the patient is Mar Daring, PA-C   LOS - 4  Subjective: Patient seen and evaluated today Has poor appetite Has lower back pain whenever she ambulates Due for kyphoplasty tomorrow Dr. Rudene Christians discussed the procedure with the patient and daughter No complaints of any chest pain Is on oxygen via nasal cannula at 2 L  Review of Systems:   CONSTITUTIONAL: No documented fever. No fatigue, weakness. No weight gain, no weight loss.  EYES: No blurry or double vision.  ENT: No tinnitus. No postnasal drip. No redness of the oropharynx.  RESPIRATORY: No cough, no wheeze, no hemoptysis. No dyspnea.  CARDIOVASCULAR: No chest pain. No orthopnea. No palpitations. No syncope.  GASTROINTESTINAL: No nausea, no vomiting or diarrhea. No abdominal pain. No melena or hematochezia.  GENITOURINARY: No dysuria or hematuria.  ENDOCRINE: No polyuria or nocturia. No heat or cold intolerance.  HEMATOLOGY: No anemia. No bruising. No bleeding.  INTEGUMENTARY: No rashes. No lesions.  MUSCULOSKELETAL: No arthritis. No swelling. No gout.  Low back pain present NEUROLOGIC: No numbness, tingling, or ataxia. No seizure-type activity.  PSYCHIATRIC: No anxiety. No insomnia. No ADD.    Vitals:   Vitals:   10/22/17 0512 10/22/17 0805 10/22/17 0900 10/22/17 0908  BP: (!) 184/83 (!) 155/72    Pulse: 80 81    Resp: 18 14    Temp: 99 F (37.2 C) 98.1 F (36.7 C)    TempSrc: Oral Oral    SpO2: 94% 95% (!) 87% 95%  Weight: 55.5 kg (122 lb 4.8 oz)     Height:        Wt Readings from Last 3 Encounters:   10/22/17 55.5 kg (122 lb 4.8 oz)  09/26/17 55.8 kg (123 lb)  08/15/17 59.1 kg (130 lb 6.4 oz)     Intake/Output Summary (Last 24 hours) at 10/22/2017 1510 Last data filed at 10/22/2017 0519 Gross per 24 hour  Intake 0 ml  Output 500 ml  Net -500 ml    Physical Exam:   GENERAL: Pleasant-appearing in no apparent distress.  HEAD, EYES, EARS, NOSE AND THROAT: Atraumatic, normocephalic. Extraocular muscles are intact. Pupils equal and reactive to light. Sclerae anicteric. No conjunctival injection. No oro-pharyngeal erythema.  NECK: Supple. There is  no jugular venous distention. No bruits, no lymphadenopathy, no thyromegaly.  HEART: Regular rate and rhythm,. No murmurs, no rubs, no clicks.  LUNGS: Clear to auscultation bilaterally. No rales or rhonchi. No wheezes.  ABDOMEN: Soft, flat, nontender, nondistended. Has good bowel sounds. No hepatosplenomegaly appreciated.  EXTREMITIES: No evidence of any cyanosis, clubbing, or peripheral edema.  +2 pedal and radial pulses bilaterally.  NEUROLOGIC: The patient is alert, awake, and oriented x3 with no focal motor or sensory deficits appreciated bilaterally. Has tenderness lower back. SKIN: Moist and warm with no rashes appreciated.  Psych: Not anxious, depressed LN: No inguinal LN enlargement    Antibiotics   Anti-infectives (From admission, onward)   Start     Dose/Rate Route Frequency Ordered Stop   10/23/17 1300  ceFAZolin (ANCEF) powder 1 g     1 g Other To Surgery 10/22/17 1228 10/24/17 1300      Medications   Scheduled Meds: . aspirin EC  81 mg Oral Daily  . [START ON 10/23/2017] ceFAZolin  1 g Other To OR  . citalopram  20 mg Oral Daily  . lisinopril  40 mg Oral Daily  . megestrol  400 mg Oral Daily  . metoprolol succinate  50 mg Oral Daily  . mometasone-formoterol  2 puff Inhalation BID  . pantoprazole  40 mg Oral QAC breakfast  . senna  1 tablet Oral Daily  . simvastatin  10 mg Oral Daily  . sodium chloride flush  3 mL  Intravenous Q12H  . sodium chloride flush  3 mL Intravenous Q12H  . tiotropium  1 capsule Inhalation Daily   Continuous Infusions: PRN Meds:.acetaminophen **OR** acetaminophen, albuterol, ALPRAZolam, alum & mag hydroxide-simeth, bisacodyl, HYDROcodone-acetaminophen, ondansetron **OR** ondansetron (ZOFRAN) IV, senna-docusate, sodium chloride, sodium chloride flush   Data Review:   Micro Results No results found for this or any previous visit (from the past 240 hour(s)).  Radiology Reports Dg Chest 2 View  Result Date: 10/19/2017 CLINICAL DATA:  Cough for several days, fever, COPD, coronary artery disease, smoker EXAM: CHEST - 2 VIEW COMPARISON:  10/18/2017 FINDINGS: Enlargement of cardiac silhouette with pulmonary vascular congestion. Atherosclerotic calcification aorta. New bibasilar atelectasis. Accentuation of interstitial markings since previous exam could be related to mild hypoinflation or developing mild interstitial infiltrate/edema. Questionable small RIGHT pleural effusion. No pneumothorax. Bones demineralized with chronic compression deformity of L1 vertebral body. IMPRESSION: Enlargement of cardiac silhouette with pulmonary vascular congestion. Bibasilar atelectasis with increased interstitial prominence question due to hypoinflation or interstitial infiltrate/edema. Electronically Signed   By: Lavonia Dana M.D.   On: 10/19/2017 16:19   Dg Chest 2 View  Result Date: 10/18/2017 CLINICAL DATA:  Cough.  Intermittent low-grade fever. EXAM: CHEST - 2 VIEW COMPARISON:  Chest x-ray dated February 09, 2015. FINDINGS: Borderline cardiomegaly, unchanged. Normal pulmonary vascularity. Atherosclerotic calcification of the aortic arch. The lungs remain mildly hyperinflated. No focal consolidation, pleural effusion, or pneumothorax. No acute osseous abnormality. Old left-sided rib fractures. IMPRESSION: COPD.  No active cardiopulmonary disease. Electronically Signed   By: Titus Dubin M.D.   On:  10/18/2017 10:34   Ct Angio Chest Pe W And/or Wo Contrast  Result Date: 10/18/2017 CLINICAL DATA:  Cough for 2 weeks EXAM: CT ANGIOGRAPHY CHEST WITH CONTRAST TECHNIQUE: Multidetector CT imaging of the chest was performed using the standard protocol during bolus administration of intravenous contrast. Multiplanar CT image reconstructions and MIPs were obtained to evaluate the vascular anatomy. CONTRAST:  28mL ISOVUE-370 IOPAMIDOL (ISOVUE-370) INJECTION 76% COMPARISON:  04/03/2011 FINDINGS:  Cardiovascular: There are no filling defects in the pulmonary arterial tree to suggest acute pulmonary thromboembolism. Atherosclerotic calcifications of the thoracic aorta are noted. Maximal diameter of the ascending aorta is 3.4 cm. Maximal diameter of the descending aorta is 4.0 cm. Atherosclerotic calcifications in the great vessels are noted. There is no obvious dissection or intramural hematoma. Atherosclerotic changes in the aorta continuing the abdomen with irregular calcified plaque. Moderate 3 vessel coronary artery calcifications. Extensive hypertrophy of the left ventricle myocardium. Mitral valve and annular calcifications are present. Mediastinum/Nodes: No pericardial effusion. Thyroid is atrophic. No abnormal mediastinal adenopathy. Unremarkable esophagus. Lungs/Pleura: Severe emphysema dependent atelectasis at the lung bases. No pneumothorax. No pleural effusion. Musculoskeletal: New T11 and L1 compression fractures compared with a lateral radiograph dated 02/09/2015. At T11, the superior endplate compression results in 10% loss of height anteriorly. At L1, there is severe height loss and some retropulsion of the superior endplate. Review of the MIP images confirms the above findings. IMPRESSION: No acute pulmonary thromboembolism. 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 Left ventricular hypertrophy. T11 and L1 compression fractures. Aortic Atherosclerosis (ICD10-I70.0) and  Emphysema (ICD10-J43.9). Electronically Signed   By: Marybelle Killings M.D.   On: 10/18/2017 12:10   Mr Lumbar Spine Wo Contrast  Result Date: 10/19/2017 CLINICAL DATA:  Low back pain following injury. EXAM: MRI LUMBAR SPINE WITHOUT CONTRAST TECHNIQUE: Multiplanar, multisequence MR imaging of the lumbar spine was performed. No intravenous contrast was administered. COMPARISON:  CT abdomen pelvis 10/18/2017. FINDINGS: Segmentation: Conventional anatomy assumed, with the last open disc space designated L5-S1. Alignment: Similar to recent CT with a mild convex left scoliosis. There is a degenerative grade 1 anterolisthesis at L4-5. Vertebrae: There is an old healed mild superior endplate compression deformity at T11. A more severe biconcave compression fracture at L1 is associated with 3 mm of osseous retropulsion and mild residual marrow edema. The posterior elements are intact. No evidence of acute fracture or pars defect. The visualized sacroiliac joints appear unremarkable. Conus medullaris: Extends to the L1 level and appears normal. Fatty filum noted. Paraspinal and other soft tissues: Small left renal cysts are stable. Disc levels: Sagittal images demonstrate mild disc degeneration from T10-11 through T12-L1. No resulting spinal stenosis or nerve root encroachment. L1-2: Disc height and hydration are largely maintained. There is mild disc bulging. No spinal stenosis or nerve root encroachment. L2-3: Mild disc bulging eccentric to the left with mild facet and ligamentous hypertrophy. Mild spinal stenosis. No nerve root encroachment. L3-4: Mild disc bulging, facet and ligamentous hypertrophy. Mild spinal stenosis. No nerve root encroachment. L4-5: There is loss of disc height with annular disc bulging eccentric to the right. There is advanced facet hypertrophy accounting for the grade 1 anterolisthesis. These factors contribute to moderate spinal stenosis with asymmetric narrowing of the right lateral recess and  right foramen. There is possible encroachment on the right L4 and L5 nerve roots. L5-S1: Disc height and hydration are maintained. Mild facet hypertrophy, worse on the left. No significant spinal stenosis or nerve root encroachment. IMPRESSION: 1. Largely healed L1 biconcave compression fracture with osseous retropulsion and mild residual marrow edema. Old healed T11 superior endplate compression deformity. No acute osseous findings. 2. Moderate multifactorial spinal stenosis at L4-5, secondary to annular disc bulging, facet hypertrophy and resulting grade 1 anterolisthesis. There is asymmetric narrowing of the right lateral recess and right foramen with possible encroachment on the right L4 and L5 nerve roots. 3. Mild multifactorial spinal stenosis at L2-3 and  L3-4 without nerve root encroachment. Electronically Signed   By: Richardean Sale M.D.   On: 10/19/2017 12:48   Ct Renal Stone Study  Result Date: 10/18/2017 CLINICAL DATA:  Left flank pain for the past 5 days. EXAM: CT ABDOMEN AND PELVIS WITHOUT CONTRAST TECHNIQUE: Multidetector CT imaging of the abdomen and pelvis was performed following the standard protocol without IV contrast. COMPARISON:  Abdomen ultrasound dated 12/08/2013. FINDINGS: Lower chest: Prominent pulmonary vasculature at the lung bases. Minimal pericardial fluid with a maximum thickness of 6 mm. No pleural fluid. Coronary artery calcifications. Dense mitral valve annulus calcifications. Hepatobiliary: No focal liver abnormality is seen. No gallstones, gallbladder wall thickening, or biliary dilatation. Pancreas: Diffuse pancreatic atrophy. Spleen: Normal in size without focal abnormality. Adrenals/Urinary Tract: The previously demonstrated 2.0 cm septated cyst in the midpole of the left kidney is grossly stable, measuring 1.9 x 0.9 cm on image number 24 series 2. There is also a 1.1 cm exophytic probable cyst measuring up to 14 Hounsfield units in density arising from the upper pole of  the left kidney. There is also a 9 mm exophytic probable cyst measuring up to 15 Hounsfield units in density arising from the lower pole of the right kidney. Normal appearing ureters, urinary bladder and adrenal glands. No urinary tract calculi or hydronephrosis. Stomach/Bowel: Large number of sigmoid and descending colon diverticula. Normal appearing appendix, stomach and small bowel with the exception of an elongated fat density mass in a loop of jejunum in the left mid abdomen, measuring 2.7 x 1.0 cm in maximum dimensions on image number 64 series 5. Vascular/Lymphatic: Atheromatous arterial calcifications without aneurysm. No enlarged lymph nodes. Reproductive: Status post hysterectomy. No adnexal masses. Other: No abdominal wall hernia or abnormality. No abdominopelvic ascites. Musculoskeletal: Approximately 70% L1 vertebral compression deformity with mild bony retropulsion and no acute fracture lines visualized. There is also an approximately 25% T11 vertebral body superior endplate compression deformity with mild Schmorl's node formation and no acute fracture lines. Facet degenerative changes in the mid and lower lumbar spine with associated grade 1 anterolisthesis at the L4-5 level. No pars defects. Mild to moderate anterior spur formation at multiple levels of the lumbar and lower thoracic spine. IMPRESSION: 1. No urinary tract calculi or hydronephrosis. 2. Small probable mildly complicated cysts arising from the upper pole of the left kidney and lower pole of the right kidney, not seen at previous ultrasound. A repeat right renal ultrasound is recommended to help determine if these are cystic or solid. 3. Pulmonary vascular congestion. 4. Minimal pericardial effusion. 5. Atheromatous arterial calcifications, including the coronary arteries. 6. Extensive sigmoid and descending colon diverticulosis. 7. Old T11 and L1 vertebral compression fractures. 8. 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 Electronically Signed   By: Claudie Revering M.D.   On: 10/18/2017 11:09     CBC Recent Labs  Lab 10/18/17 0958 10/19/17 0415 10/20/17 0318 10/22/17 0719  WBC 9.3 6.3 6.9 5.1  HGB 15.3 12.7 11.7* 13.0  HCT 46.1 38.1 35.7 39.6  PLT 286 214 199 216  MCV 96.4 97.1 97.9 97.1  MCH 32.1 32.4 32.0 31.9  MCHC 33.3 33.3 32.7 32.9  RDW 13.5 13.6 13.4 13.7  LYMPHSABS 1.0  --   --   --   MONOABS 0.5  --   --   --   EOSABS 0.1  --   --   --   BASOSABS 0.1  --   --   --  Chemistries  Recent Labs  Lab 10/18/17 0958 10/19/17 0415 10/20/17 0318 10/21/17 0424 10/22/17 0719  NA 137 139 138 137 136  K 2.5* 3.3* 3.0* 4.0 4.1  CL 88* 100* 102 101 98*  CO2 30 30 26 27 27   GLUCOSE 130* 94 111* 108* 112*  BUN 22* 25* 21* 13 13  CREATININE 1.20* 1.43* 0.92 0.79 0.80  CALCIUM 9.3 8.2* 8.6* 8.8* 8.8*  MG  --   --  1.4* 1.5* 1.7  AST 27  --   --   --   --   ALT 11*  --   --   --   --   ALKPHOS 99  --   --   --   --   BILITOT 1.1  --   --   --   --    ------------------------------------------------------------------------------------------------------------------ estimated creatinine clearance is 53.4 mL/min (by C-G formula based on SCr of 0.8 mg/dL). ------------------------------------------------------------------------------------------------------------------ No results for input(s): HGBA1C in the last 72 hours. ------------------------------------------------------------------------------------------------------------------ No results for input(s): CHOL, HDL, LDLCALC, TRIG, CHOLHDL, LDLDIRECT in the last 72 hours. ------------------------------------------------------------------------------------------------------------------ No results for input(s): TSH, T4TOTAL, T3FREE, THYROIDAB in the last 72 hours.  Invalid input(s): FREET3 ------------------------------------------------------------------------------------------------------------------ No results  for input(s): VITAMINB12, FOLATE, FERRITIN, TIBC, IRON, RETICCTPCT in the last 72 hours.  Coagulation profile No results for input(s): INR, PROTIME in the last 168 hours.  No results for input(s): DDIMER in the last 72 hours.  Cardiac Enzymes Recent Labs  Lab 10/18/17 1636 10/18/17 2111 10/19/17 0415  TROPONINI 0.06* 0.07* 0.07*   ------------------------------------------------------------------------------------------------------------------ Invalid input(s): POCBNP    Assessment & Plan   72 year old female patient with history of emphysema, coronary artery disease, anxiety disorder, hyperlipidemia, hypertension presented to the emergency room with flank pain and hypoxia  1. Electrolyte Imbalance Magnesium replaced Potassium supplemented  2 Dehydration imroved IV fluid hydration  given Hold Lasix for now  3.Hypoxia Chest x-ray no acute findings CT angiogram negative for acute pulmonary embolism F/u Echo  4.Elevated troponin secondary to demand ischemia Troponin trend 0.06-0.07-0.07  5.  Constipation Continue stool softeners  6.  Acute on chronic low back pain secondary to compression fractures  MRI of the lumbar spine with no acute findings but reveals Largely healed L1 biconcave compression fracture with osseous retropulsion and mild residual marrow edema. Old healed T11 superior endplate compression deformity. No acute osseous findings. 2. Moderate multifactorial spinal stenosis at L4-5, secondary to annular disc bulging, facet hypertrophy and resulting grade 1 anterolisthesis. There is asymmetric narrowing of the right lateral recess and right foramen with possible encroachment on the right L4 and L5 nerve roots. 3. Mild multifactorial spinal stenosis at L2-3 and L3-4 without nerve root encroachment  Dr. Rudene Christians scheduled pt for  kyphoplasty on Thursday, Eliquis is on hold Case was discussed with the patient and daughter by Dr. Rudene Christians.  They are willing for  the procedure tomorrow kyphoplasty will be performed.  7.Descending thoracic aortic aneurysm Periodicimaging as outpatient by primary care physician          Code Status Orders  (From admission, onward)        Start     Ordered   10/18/17 1629  Full code  Continuous     10/18/17 1628    Code Status History    This patient has a current code status but no historical code status.     Time Spent in minutes   35 minutes  Greater than 50% of time spent in care coordination  and counseling patient regarding the condition and plan of care.   Saundra Shelling M.D on 10/22/2017 at 3:10 PM  Between 7am to 6pm - Pager - [phone]  After 6pm go to [email or web address] - Proofreader  [employer]   Office  [phone]

## 2017-10-23 ENCOUNTER — Inpatient Hospital Stay: Payer: Medicare Other

## 2017-10-23 ENCOUNTER — Inpatient Hospital Stay: Payer: Medicare Other | Admitting: Anesthesiology

## 2017-10-23 ENCOUNTER — Encounter
Admission: EM | Disposition: A | Discharge status: Home Health | Payer: Self-pay | Source: Home / Self Care | Attending: Internal Medicine

## 2017-10-23 ENCOUNTER — Encounter: Payer: Self-pay | Admitting: *Deleted

## 2017-10-23 HISTORY — PX: KYPHOPLASTY: SHX5884

## 2017-10-23 LAB — MRSA PCR SCREENING: MRSA by PCR: NEGATIVE

## 2017-10-23 LAB — GLUCOSE, CAPILLARY: Glucose-Capillary: 100 mg/dL — ABNORMAL HIGH (ref 65–99)

## 2017-10-23 SURGERY — KYPHOPLASTY
Anesthesia: Monitor Anesthesia Care

## 2017-10-23 MED ORDER — PHENYLEPHRINE HCL 10 MG/ML IJ SOLN
INTRAMUSCULAR | Status: DC | PRN
  Administered 2017-10-23 (×3): 100 ug via INTRAVENOUS

## 2017-10-23 MED ORDER — LIDOCAINE HCL 1 % IJ SOLN
INTRAMUSCULAR | Status: DC | PRN
Start: 2017-10-23 — End: 2017-10-23
  Administered 2017-10-23: 20 mL

## 2017-10-23 MED ORDER — PROPOFOL 10 MG/ML IV BOLUS
INTRAVENOUS | Status: AC
  Filled 2017-10-23: qty 20

## 2017-10-23 MED ORDER — KETAMINE HCL 50 MG/ML IJ SOLN
INTRAMUSCULAR | Status: DC | PRN
  Administered 2017-10-23: 5 mg via INTRAMUSCULAR
  Administered 2017-10-23: 10 mg via INTRAMUSCULAR

## 2017-10-23 MED ORDER — DEXTROSE-NACL 5-0.9 % IV SOLN
INTRAVENOUS | Status: DC
  Administered 2017-10-23: 10:00:00 via INTRAVENOUS

## 2017-10-23 MED ORDER — FENTANYL CITRATE (PF) 100 MCG/2ML IJ SOLN
INTRAMUSCULAR | Status: DC | PRN
  Administered 2017-10-23 (×5): 25 ug via INTRAVENOUS

## 2017-10-23 MED ORDER — IOPAMIDOL (ISOVUE-M 200) INJECTION 41%
INTRAMUSCULAR | Status: DC | PRN
  Administered 2017-10-23: 40 mL

## 2017-10-23 MED ORDER — GLYCOPYRROLATE 0.2 MG/ML IJ SOLN
INTRAMUSCULAR | Status: DC | PRN
  Administered 2017-10-23: 0.1 mg via INTRAVENOUS

## 2017-10-23 MED ORDER — FENTANYL CITRATE (PF) 250 MCG/5ML IJ SOLN
INTRAMUSCULAR | Status: AC
  Filled 2017-10-23: qty 5

## 2017-10-23 MED ORDER — KETAMINE HCL 50 MG/ML IJ SOLN
INTRAMUSCULAR | Status: AC
  Filled 2017-10-23: qty 10

## 2017-10-23 MED ORDER — MIDAZOLAM HCL 2 MG/2ML IJ SOLN
INTRAMUSCULAR | Status: DC | PRN
  Administered 2017-10-23 (×2): 1 mg via INTRAVENOUS

## 2017-10-23 MED ORDER — CEFAZOLIN SODIUM-DEXTROSE 1-4 GM/50ML-% IV SOLN
INTRAVENOUS | Status: AC
  Filled 2017-10-23: qty 50

## 2017-10-23 MED ORDER — MIDAZOLAM HCL 2 MG/2ML IJ SOLN
INTRAMUSCULAR | Status: AC
  Filled 2017-10-23: qty 2

## 2017-10-23 MED ORDER — LACTATED RINGERS IV SOLN
INTRAVENOUS | Status: DC | PRN
  Administered 2017-10-23: 14:00:00 via INTRAVENOUS

## 2017-10-23 MED ORDER — HYDRALAZINE HCL 20 MG/ML IJ SOLN
10.0000 mg | INTRAMUSCULAR | Status: DC | PRN
  Administered 2017-10-23 – 2017-10-24 (×3): 10 mg via INTRAVENOUS
  Filled 2017-10-23 (×4): qty 1

## 2017-10-23 MED ORDER — DEXMEDETOMIDINE HCL 200 MCG/2ML IV SOLN
INTRAVENOUS | Status: DC | PRN
  Administered 2017-10-23 (×4): 2 ug via INTRAVENOUS

## 2017-10-23 MED ORDER — CHLORHEXIDINE GLUCONATE CLOTH 2 % EX PADS
6.0000 | MEDICATED_PAD | Freq: Every day | CUTANEOUS | Status: AC
  Administered 2017-10-23: 6 via TOPICAL

## 2017-10-23 MED ORDER — PROPOFOL 10 MG/ML IV BOLUS
INTRAVENOUS | Status: DC | PRN
  Administered 2017-10-23: 10 mg via INTRAVENOUS
  Administered 2017-10-23: 20 mg via INTRAVENOUS
  Administered 2017-10-23: 10 mg via INTRAVENOUS

## 2017-10-23 MED ORDER — PROPOFOL 500 MG/50ML IV EMUL: INTRAVENOUS | Status: DC | PRN

## 2017-10-23 MED ORDER — BUPIVACAINE-EPINEPHRINE (PF) 0.5% -1:200000 IJ SOLN
INTRAMUSCULAR | Status: DC | PRN
  Administered 2017-10-23: 10 mL via PERINEURAL

## 2017-10-23 SURGICAL SUPPLY — 16 items
CEMENT KYPHON CX01A KIT/MIXER (Cement) ×2 IMPLANT
DERMABOND ADVANCED (GAUZE/BANDAGES/DRESSINGS) ×1
DERMABOND ADVANCED .7 DNX12 (GAUZE/BANDAGES/DRESSINGS) ×1 IMPLANT
DEVICE BIOPSY BONE KYPHX (INSTRUMENTS) ×2 IMPLANT
DRAPE C-ARM XRAY 36X54 (DRAPES) ×2 IMPLANT
DURAPREP 26ML APPLICATOR (WOUND CARE) ×2 IMPLANT
GLOVE SURG SYN 9.0  PF PI (GLOVE) ×1
GLOVE SURG SYN 9.0 PF PI (GLOVE) ×1 IMPLANT
GOWN SRG 2XL LVL 4 RGLN SLV (GOWNS) ×1 IMPLANT
GOWN STRL NON-REIN 2XL LVL4 (GOWNS) ×1
GOWN STRL REUS W/ TWL LRG LVL3 (GOWN DISPOSABLE) ×1 IMPLANT
GOWN STRL REUS W/TWL LRG LVL3 (GOWN DISPOSABLE) ×1
PACK KYPHOPLASTY (MISCELLANEOUS) ×2 IMPLANT
STRAP SAFETY 5IN WIDE (MISCELLANEOUS) ×2 IMPLANT
TRAY KYPHOPAK 15/3 EXPRESS 1ST (MISCELLANEOUS) ×2 IMPLANT
TRAY KYPHOPAK 20/3 EXPRESS 1ST (MISCELLANEOUS) IMPLANT

## 2017-10-23 NOTE — Transfer of Care (Signed)
Immediate Anesthesia Transfer of Care Note  Patient: [name] [name]  Procedure(s) Performed: Anne Ng (N/A )  Patient Location: PACU  Anesthesia Type:MAC  Level of Consciousness: awake, alert  and oriented  Airway & Oxygen Therapy: Patient Spontanous Breathing and Patient connected to nasal cannula oxygen  Post-op Assessment: Report given to RN and Post -op Vital signs reviewed and stable  Post vital signs: Reviewed and stable  Last Vitals:  Vitals Value Taken Time  BP 131/69 10/23/2017  2:27 PM  Temp    Pulse 92 10/23/2017  2:27 PM  Resp 12 10/23/2017  2:27 PM  SpO2 94 % 10/23/2017  2:27 PM  Vitals shown include unvalidated device data.  Last Pain:  Vitals:   10/23/17 0827  TempSrc: Oral  PainSc:       Patients Stated Pain Goal: 0 (92/01/00 7121)  Complications: No apparent anesthesia complications

## 2017-10-23 NOTE — Clinical Social Work Note (Signed)
CSW received referral that patient may need SNF for short term rehab.  CSW reviewed patient's chart and saw that she will be having surgery on Thursday.  CSW will begin bed search in [facility], after patient is reevaluated by PT after surgery for insurance authorization.  Jones Broom. [place], MSW, [place]  10/23/2017 8:32 AM

## 2017-10-23 NOTE — Progress Notes (Signed)
PT Cancellation Note  Patient Details Name: [name] [name] MRN: [id number] DOB: 1945-11-28   Cancelled Treatment:    Reason Eval/Treat Not Completed: Medical issues which prohibited therapy.  Kyphoplasty completed today.  Will plan to re-evaluate 10/24/2017.   Roxanne Gates, PT, DPT 10/23/2017, 3:29 PM

## 2017-10-23 NOTE — Op Note (Signed)
10/23/2017  2:25 PM  PATIENT:  [name] [name]  72 y.o. female  PRE-OPERATIVE DIAGNOSIS:  compression fracture L1  POST-OPERATIVE DIAGNOSIS:  compression fracture L1  PROCEDURE:  Procedure(s): KYPHOPLASTY-L1 (N/A)  SURGEON: Laurene Footman, MD  ASSISTANTS: none  ANESTHESIA:   local and MAC  EBL:  Total I/O In: 700 [I.V.:700] Out: 200 [Urine:200]  BLOOD ADMINISTERED:none  DRAINS: none   LOCAL MEDICATIONS USED:  MARCAINE    and XYLOCAINE   SPECIMEN:  No Specimen  DISPOSITION OF SPECIMEN:  N/A  COUNTS:  YES  TOURNIQUET:  * No tourniquets in log *  IMPLANTS: bone cement   DICTATION: .Dragon Dictation patient was brought to the operating room and after adequate anesthesia was obtained the patient was placed prone.  Serum was brought in in good visualization of L1 was obtained in both AP and lateral projections showing moderate compression.  Appropriate patient identification and timeout procedures were completed and 10 cc of 1% Xylocaine was infiltrated on the right side.  The back was then prepped and draped in usual sterile fashion and repeat timeout procedure carried out.  Spinal needle was used to get local down to the pedicle on the right side at L1.  After allowing this to set with a 50-50 mix of 1% Xylocaine half percent Sensorcaine with epinephrine a small incision was made and a trocar advanced in extrapedicular fashion into the vertebral body.  Care was taken to remain in line with the pedicle on the lateral view and outside the medial wall the pedicle on the AP view to the vertebral body was entered.  Biopsy was not obtained.  Drilling was carried out followed by placement of the balloon with inflation of 2.5 cc.  Amount was mixed at this time and what was the appropriate consistency was a slowly inserted there is a small amount of extravasation that went between the L1 and T12 vertebral bodies into the disc space with apparent for a fracture line that went through the L1 to  this point.  Further infiltration was held until that had set and then the trocar brought backwards and additional fill for a total of 4.5 cc.  There is no other extravasation.  The trocar was removed with permanent C-arm views obtained followed by Dermabond for the skin and a Band-Aid   PLAN OF CARE: continue as inpatient  PATIENT DISPOSITION:  PACU - hemodynamically stable.

## 2017-10-23 NOTE — Progress Notes (Signed)
Pt requesting staff to leave room on hourly round.  Previously calm, cooperative. Reports the "pure unadulterated devil is here.  Declines to answer questions of orientation.  Bed alarm in place.  Staff will monitor at a distance with door open to prevent agitation/anxiety.

## 2017-10-23 NOTE — Anesthesia Post-op Follow-up Note (Signed)
Anesthesia QCDR form completed.        

## 2017-10-23 NOTE — Progress Notes (Signed)
[place] at [facility]                                                                                                                                                                                  Patient Demographics   [name] [name], is a 72 y.o. female, DOB - 1946-03-17, CVE:[id number]  Admit date - 10/18/2017   Admitting Physician Saundra Shelling, MD  Outpatient Primary MD for the patient is Mar Daring, PA-C   LOS - 5  Subjective: Patient seen and evaluated by me today Has improved appetite after megace was started Has lower back pain whenever she ambulates and turns around Due for kyphoplasty today afternoon No complaints of any chest pain Is on oxygen via nasal cannula at 2 L  Review of Systems:   CONSTITUTIONAL: No documented fever. No fatigue, weakness. No weight gain, no weight loss.  EYES: No blurry or double vision.  ENT: No tinnitus. No postnasal drip. No redness of the oropharynx.  RESPIRATORY: No cough, no wheeze, no hemoptysis. No dyspnea.  CARDIOVASCULAR: No chest pain. No orthopnea. No palpitations. No syncope.  GASTROINTESTINAL: No nausea, no vomiting or diarrhea. No abdominal pain. No melena or hematochezia.  GENITOURINARY: No dysuria or hematuria.  ENDOCRINE: No polyuria or nocturia. No heat or cold intolerance.  HEMATOLOGY: No anemia. No bruising. No bleeding.  INTEGUMENTARY: No rashes. No lesions.  MUSCULOSKELETAL: No arthritis. No swelling. No gout.  Low back pain present NEUROLOGIC: No numbness, tingling, or ataxia. No seizure-type activity.  PSYCHIATRIC: No anxiety. No insomnia. No ADD.    Vitals:   Vitals:   10/22/17 1914 10/23/17 0054 10/23/17 0419 10/23/17 0827  BP: 132/73  (!) 159/78 (!) 182/64  Pulse: 67  77 80  Resp: 18  18 14   Temp: 98.9 F (37.2 C)  99 F (37.2 C) 98.8 F (37.1 C)  TempSrc: Oral  Oral Oral  SpO2: 99%  96% 94%  Weight:  54.1 kg (119 lb 3.2 oz) 54.1 kg (119 lb 3.2 oz)   Height:  5'  3" (1.6 m)      Wt Readings from Last 3 Encounters:  10/23/17 54.1 kg (119 lb 3.2 oz)  09/26/17 55.8 kg (123 lb)  08/15/17 59.1 kg (130 lb 6.4 oz)     Intake/Output Summary (Last 24 hours) at 10/23/2017 0941 Last data filed at 10/23/2017 0419 Gross per 24 hour  Intake 240 ml  Output 850 ml  Net -610 ml    Physical Exam:   GENERAL: Pleasant-appearing elderly patient in no apparent distress.  HEAD, EYES, EARS, NOSE AND THROAT: Atraumatic, normocephalic. Extraocular muscles are intact. Pupils equal and reactive to light. Sclerae  anicteric. No conjunctival injection. No oro-pharyngeal erythema.  NECK: Supple. There is no jugular venous distention. No bruits, no lymphadenopathy, no thyromegaly.  HEART: Regular rate and rhythm,. No murmurs, no rubs, no clicks.  LUNGS: Clear to auscultation bilaterally. No rales or rhonchi. No wheezes.  ABDOMEN: Soft, flat, nontender, nondistended. Has good bowel sounds. No hepatosplenomegaly appreciated.  EXTREMITIES: No evidence of any cyanosis, clubbing, or peripheral edema.  +2 pedal and radial pulses bilaterally.  NEUROLOGIC: The patient is alert, awake, and oriented x3 with no focal motor or sensory deficits appreciated bilaterally. Has tenderness lower back. SKIN: Moist and warm with no rashes appreciated.  Psych: Not anxious, depressed LN: No inguinal LN enlargement    Antibiotics   Anti-infectives (From admission, onward)   Start     Dose/Rate Route Frequency Ordered Stop   10/23/17 1300  ceFAZolin (ANCEF) powder 1 g     1 g Other To Surgery 10/22/17 1228 10/24/17 1300      Medications   Scheduled Meds: . aspirin EC  81 mg Oral Daily  . ceFAZolin  1 g Other To OR  . citalopram  20 mg Oral Daily  . lisinopril  40 mg Oral Daily  . megestrol  400 mg Oral Daily  . metoprolol succinate  50 mg Oral Daily  . mometasone-formoterol  2 puff Inhalation BID  . pantoprazole  40 mg Oral QAC breakfast  . senna  1 tablet Oral Daily  .  simvastatin  10 mg Oral Daily  . sodium chloride flush  3 mL Intravenous Q12H  . sodium chloride flush  3 mL Intravenous Q12H  . tiotropium  1 capsule Inhalation Daily   Continuous Infusions: . dextrose 5 % and 0.9% NaCl     PRN Meds:.acetaminophen **OR** acetaminophen, albuterol, ALPRAZolam, alum & mag hydroxide-simeth, bisacodyl, HYDROcodone-acetaminophen, ondansetron **OR** ondansetron (ZOFRAN) IV, senna-docusate, sodium chloride, sodium chloride flush   Data Review:   Micro Results Recent Results (from the past 240 hour(s))  MRSA PCR Screening     Status: None   Collection Time: 10/23/17  4:40 AM  Result Value Ref Range Status   MRSA by PCR NEGATIVE NEGATIVE Final    Comment:        The GeneXpert MRSA Assay (FDA approved for NASAL specimens only), is one component of a comprehensive MRSA colonization surveillance program. It is not intended to diagnose MRSA infection nor to guide or monitor treatment for MRSA infections. Performed at [facility], [address] [place], [place] [address]     Radiology Reports Dg Chest 2 View  Result Date: 10/19/2017 CLINICAL DATA:  Cough for several days, fever, COPD, coronary artery disease, smoker EXAM: CHEST - 2 VIEW COMPARISON:  10/18/2017 FINDINGS: Enlargement of cardiac silhouette with pulmonary vascular congestion. Atherosclerotic calcification aorta. New bibasilar atelectasis. Accentuation of interstitial markings since previous exam could be related to mild hypoinflation or developing mild interstitial infiltrate/edema. Questionable small RIGHT pleural effusion. No pneumothorax. Bones demineralized with chronic compression deformity of L1 vertebral body. IMPRESSION: Enlargement of cardiac silhouette with pulmonary vascular congestion. Bibasilar atelectasis with increased interstitial prominence question due to hypoinflation or interstitial infiltrate/edema. Electronically Signed   By: Lavonia Dana M.D.   On: 10/19/2017  16:19   Dg Chest 2 View  Result Date: 10/18/2017 CLINICAL DATA:  Cough.  Intermittent low-grade fever. EXAM: CHEST - 2 VIEW COMPARISON:  Chest x-ray dated February 09, 2015. FINDINGS: Borderline cardiomegaly, unchanged. Normal pulmonary vascularity. Atherosclerotic calcification of the aortic arch. The lungs remain mildly  hyperinflated. No focal consolidation, pleural effusion, or pneumothorax. No acute osseous abnormality. Old left-sided rib fractures. IMPRESSION: COPD.  No active cardiopulmonary disease. Electronically Signed   By: Titus Dubin M.D.   On: 10/18/2017 10:34   Ct Angio Chest Pe W And/or Wo Contrast  Result Date: 10/18/2017 CLINICAL DATA:  Cough for 2 weeks EXAM: CT ANGIOGRAPHY CHEST WITH CONTRAST TECHNIQUE: Multidetector CT imaging of the chest was performed using the standard protocol during bolus administration of intravenous contrast. Multiplanar CT image reconstructions and MIPs were obtained to evaluate the vascular anatomy. CONTRAST:  52mL ISOVUE-370 IOPAMIDOL (ISOVUE-370) INJECTION 76% COMPARISON:  04/03/2011 FINDINGS: Cardiovascular: There are no filling defects in the pulmonary arterial tree to suggest acute pulmonary thromboembolism. Atherosclerotic calcifications of the thoracic aorta are noted. Maximal diameter of the ascending aorta is 3.4 cm. Maximal diameter of the descending aorta is 4.0 cm. Atherosclerotic calcifications in the great vessels are noted. There is no obvious dissection or intramural hematoma. Atherosclerotic changes in the aorta continuing the abdomen with irregular calcified plaque. Moderate 3 vessel coronary artery calcifications. Extensive hypertrophy of the left ventricle myocardium. Mitral valve and annular calcifications are present. Mediastinum/Nodes: No pericardial effusion. Thyroid is atrophic. No abnormal mediastinal adenopathy. Unremarkable esophagus. Lungs/Pleura: Severe emphysema dependent atelectasis at the lung bases. No pneumothorax. No pleural  effusion. Musculoskeletal: New T11 and L1 compression fractures compared with a lateral radiograph dated 02/09/2015. At T11, the superior endplate compression results in 10% loss of height anteriorly. At L1, there is severe height loss and some retropulsion of the superior endplate. Review of the MIP images confirms the above findings. IMPRESSION: No acute pulmonary thromboembolism. 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 Left ventricular hypertrophy. T11 and L1 compression fractures. Aortic Atherosclerosis (ICD10-I70.0) and Emphysema (ICD10-J43.9). Electronically Signed   By: Marybelle Killings M.D.   On: 10/18/2017 12:10   Mr Lumbar Spine Wo Contrast  Result Date: 10/19/2017 CLINICAL DATA:  Low back pain following injury. EXAM: MRI LUMBAR SPINE WITHOUT CONTRAST TECHNIQUE: Multiplanar, multisequence MR imaging of the lumbar spine was performed. No intravenous contrast was administered. COMPARISON:  CT abdomen pelvis 10/18/2017. FINDINGS: Segmentation: Conventional anatomy assumed, with the last open disc space designated L5-S1. Alignment: Similar to recent CT with a mild convex left scoliosis. There is a degenerative grade 1 anterolisthesis at L4-5. Vertebrae: There is an old healed mild superior endplate compression deformity at T11. A more severe biconcave compression fracture at L1 is associated with 3 mm of osseous retropulsion and mild residual marrow edema. The posterior elements are intact. No evidence of acute fracture or pars defect. The visualized sacroiliac joints appear unremarkable. Conus medullaris: Extends to the L1 level and appears normal. Fatty filum noted. Paraspinal and other soft tissues: Small left renal cysts are stable. Disc levels: Sagittal images demonstrate mild disc degeneration from T10-11 through T12-L1. No resulting spinal stenosis or nerve root encroachment. L1-2: Disc height and hydration are largely maintained. There is mild disc bulging. No spinal  stenosis or nerve root encroachment. L2-3: Mild disc bulging eccentric to the left with mild facet and ligamentous hypertrophy. Mild spinal stenosis. No nerve root encroachment. L3-4: Mild disc bulging, facet and ligamentous hypertrophy. Mild spinal stenosis. No nerve root encroachment. L4-5: There is loss of disc height with annular disc bulging eccentric to the right. There is advanced facet hypertrophy accounting for the grade 1 anterolisthesis. These factors contribute to moderate spinal stenosis with asymmetric narrowing of the right lateral recess and right foramen. There is possible  encroachment on the right L4 and L5 nerve roots. L5-S1: Disc height and hydration are maintained. Mild facet hypertrophy, worse on the left. No significant spinal stenosis or nerve root encroachment. IMPRESSION: 1. Largely healed L1 biconcave compression fracture with osseous retropulsion and mild residual marrow edema. Old healed T11 superior endplate compression deformity. No acute osseous findings. 2. Moderate multifactorial spinal stenosis at L4-5, secondary to annular disc bulging, facet hypertrophy and resulting grade 1 anterolisthesis. There is asymmetric narrowing of the right lateral recess and right foramen with possible encroachment on the right L4 and L5 nerve roots. 3. Mild multifactorial spinal stenosis at L2-3 and L3-4 without nerve root encroachment. Electronically Signed   By: Richardean Sale M.D.   On: 10/19/2017 12:48   Ct Renal Stone Study  Result Date: 10/18/2017 CLINICAL DATA:  Left flank pain for the past 5 days. EXAM: CT ABDOMEN AND PELVIS WITHOUT CONTRAST TECHNIQUE: Multidetector CT imaging of the abdomen and pelvis was performed following the standard protocol without IV contrast. COMPARISON:  Abdomen ultrasound dated 12/08/2013. FINDINGS: Lower chest: Prominent pulmonary vasculature at the lung bases. Minimal pericardial fluid with a maximum thickness of 6 mm. No pleural fluid. Coronary artery  calcifications. Dense mitral valve annulus calcifications. Hepatobiliary: No focal liver abnormality is seen. No gallstones, gallbladder wall thickening, or biliary dilatation. Pancreas: Diffuse pancreatic atrophy. Spleen: Normal in size without focal abnormality. Adrenals/Urinary Tract: The previously demonstrated 2.0 cm septated cyst in the midpole of the left kidney is grossly stable, measuring 1.9 x 0.9 cm on image number 24 series 2. There is also a 1.1 cm exophytic probable cyst measuring up to 14 Hounsfield units in density arising from the upper pole of the left kidney. There is also a 9 mm exophytic probable cyst measuring up to 15 Hounsfield units in density arising from the lower pole of the right kidney. Normal appearing ureters, urinary bladder and adrenal glands. No urinary tract calculi or hydronephrosis. Stomach/Bowel: Large number of sigmoid and descending colon diverticula. Normal appearing appendix, stomach and small bowel with the exception of an elongated fat density mass in a loop of jejunum in the left mid abdomen, measuring 2.7 x 1.0 cm in maximum dimensions on image number 64 series 5. Vascular/Lymphatic: Atheromatous arterial calcifications without aneurysm. No enlarged lymph nodes. Reproductive: Status post hysterectomy. No adnexal masses. Other: No abdominal wall hernia or abnormality. No abdominopelvic ascites. Musculoskeletal: Approximately 70% L1 vertebral compression deformity with mild bony retropulsion and no acute fracture lines visualized. There is also an approximately 25% T11 vertebral body superior endplate compression deformity with mild Schmorl's node formation and no acute fracture lines. Facet degenerative changes in the mid and lower lumbar spine with associated grade 1 anterolisthesis at the L4-5 level. No pars defects. Mild to moderate anterior spur formation at multiple levels of the lumbar and lower thoracic spine. IMPRESSION: 1. No urinary tract calculi or  hydronephrosis. 2. Small probable mildly complicated cysts arising from the upper pole of the left kidney and lower pole of the right kidney, not seen at previous ultrasound. A repeat right renal ultrasound is recommended to help determine if these are cystic or solid. 3. Pulmonary vascular congestion. 4. Minimal pericardial effusion. 5. Atheromatous arterial calcifications, including the coronary arteries. 6. Extensive sigmoid and descending colon diverticulosis. 7. Old T11 and L1 vertebral compression fractures. 8. 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 Electronically Signed   By: Claudie Revering M.D.   On: 10/18/2017 11:09  CBC Recent Labs  Lab 10/18/17 0958 10/19/17 0415 10/20/17 0318 10/22/17 0719  WBC 9.3 6.3 6.9 5.1  HGB 15.3 12.7 11.7* 13.0  HCT 46.1 38.1 35.7 39.6  PLT 286 214 199 216  MCV 96.4 97.1 97.9 97.1  MCH 32.1 32.4 32.0 31.9  MCHC 33.3 33.3 32.7 32.9  RDW 13.5 13.6 13.4 13.7  LYMPHSABS 1.0  --   --   --   MONOABS 0.5  --   --   --   EOSABS 0.1  --   --   --   BASOSABS 0.1  --   --   --     Chemistries  Recent Labs  Lab 10/18/17 0958 10/19/17 0415 10/20/17 0318 10/21/17 0424 10/22/17 0719  NA 137 139 138 137 136  K 2.5* 3.3* 3.0* 4.0 4.1  CL 88* 100* 102 101 98*  CO2 30 30 26 27 27   GLUCOSE 130* 94 111* 108* 112*  BUN 22* 25* 21* 13 13  CREATININE 1.20* 1.43* 0.92 0.79 0.80  CALCIUM 9.3 8.2* 8.6* 8.8* 8.8*  MG  --   --  1.4* 1.5* 1.7  AST 27  --   --   --   --   ALT 11*  --   --   --   --   ALKPHOS 99  --   --   --   --   BILITOT 1.1  --   --   --   --    ------------------------------------------------------------------------------------------------------------------ estimated creatinine clearance is 53.4 mL/min (by C-G formula based on SCr of 0.8 mg/dL). ------------------------------------------------------------------------------------------------------------------ No results for input(s): HGBA1C in  the last 72 hours. ------------------------------------------------------------------------------------------------------------------ No results for input(s): CHOL, HDL, LDLCALC, TRIG, CHOLHDL, LDLDIRECT in the last 72 hours. ------------------------------------------------------------------------------------------------------------------ No results for input(s): TSH, T4TOTAL, T3FREE, THYROIDAB in the last 72 hours.  Invalid input(s): FREET3 ------------------------------------------------------------------------------------------------------------------ No results for input(s): VITAMINB12, FOLATE, FERRITIN, TIBC, IRON, RETICCTPCT in the last 72 hours.  Coagulation profile No results for input(s): INR, PROTIME in the last 168 hours.  No results for input(s): DDIMER in the last 72 hours.  Cardiac Enzymes Recent Labs  Lab 10/18/17 1636 10/18/17 2111 10/19/17 0415  TROPONINI 0.06* 0.07* 0.07*   ------------------------------------------------------------------------------------------------------------------ Invalid input(s): POCBNP    Assessment & Plan   72 year old female patient with history of emphysema, coronary artery disease, anxiety disorder, hyperlipidemia, hypertension presented to the emergency room with low back pain secondary to vertebral compression fractures  1.  Acute on chronic low back pain secondary to compression fractures  MRI of the lumbar spine with no acute findings but reveals Largely healed L1 biconcave compression fracture with osseous retropulsion and mild residual marrow edema. Old healed T11 superior endplate compression deformity. No acute osseous findings. 2. Moderate multifactorial spinal stenosis at L4-5, secondary to annular disc bulging, facet hypertrophy and resulting grade 1 anterolisthesis. There is asymmetric narrowing of the right lateral recess and right foramen with possible encroachment on the right L4 and L5 nerve roots. 3. Mild  multifactorial spinal stenosis at L2-3 and L3-4 without nerve root encroachment  Dr. Rudene Christians scheduled pt for  kyphoplasty today, Eliquis is on hold Case was discussed with the patient and daughter by Dr. Rudene Christians.  They are willing for the procedure kyphoplasty.  2 Uncontrolled hypertension Continue oral lisinopril and metoprolol Add IV hydralazine prn for better control of blood pressure  3.Hypoxia Chest x-ray no acute findings CT angiogram negative for acute pulmonary embolism Comfortable on oxygen via nasal canula  4.Elevated troponin  secondary to demand ischemia Troponin trend 0.06-0.07-0.07  5.  Constipation improved Continue stool softeners  6.Descending thoracic aortic aneurysm Periodicimaging as outpatient by primary care physician  7. NPO for now Gentle IV fluids          Code Status Orders  (From admission, onward)        Start     Ordered   10/18/17 1629  Full code  Continuous     10/18/17 1628    Code Status History    This patient has a current code status but no historical code status.     Time Spent in minutes   35 minutes  Greater than 50% of time spent in care coordination and counseling patient regarding the condition and plan of care.   Saundra Shelling M.D on 10/23/2017 at 9:41 AM  Between 7am to 6pm - Pager - [phone]  After 6pm go to [email or web address] - Proofreader  [employer]   Office  [phone]

## 2017-10-24 ENCOUNTER — Encounter: Payer: Self-pay | Admitting: Orthopedic Surgery

## 2017-10-24 LAB — MAGNESIUM: MAGNESIUM: 1.4 mg/dL — AB (ref 1.7–2.4)

## 2017-10-24 LAB — BASIC METABOLIC PANEL
Anion gap: 12 (ref 5–15)
BUN: 11 mg/dL (ref 6–20)
CALCIUM: 8.5 mg/dL — AB (ref 8.9–10.3)
CO2: 25 mmol/L (ref 22–32)
CREATININE: 0.7 mg/dL (ref 0.44–1.00)
Chloride: 97 mmol/L — ABNORMAL LOW (ref 101–111)
GFR calc Af Amer: >60 mL/min (ref >60)
Glucose, Bld: 101 mg/dL — ABNORMAL HIGH (ref 65–99)
Potassium: 3.2 mmol/L — ABNORMAL LOW (ref 3.5–5.1)
SODIUM: 134 mmol/L — AB (ref 135–145)

## 2017-10-24 MED ORDER — DOCUSATE SODIUM 100 MG PO CAPS
100.0000 mg | ORAL_CAPSULE | Freq: Two times a day (BID) | ORAL | Status: DC
  Administered 2017-10-24: 100 mg via ORAL
  Filled 2017-10-24 (×2): qty 1

## 2017-10-24 MED ORDER — METOPROLOL SUCCINATE ER 50 MG PO TB24
50.0000 mg | ORAL_TABLET | Freq: Every day | ORAL | Status: DC
  Administered 2017-10-24: 50 mg via ORAL
  Filled 2017-10-24: qty 1

## 2017-10-24 MED ORDER — ONDANSETRON HCL 4 MG PO TABS
4.0000 mg | ORAL_TABLET | Freq: Four times a day (QID) | ORAL | Status: DC | PRN
Start: 2017-10-24 — End: 2017-10-24

## 2017-10-24 MED ORDER — LISINOPRIL 20 MG PO TABS
40.0000 mg | ORAL_TABLET | Freq: Every day | ORAL | Status: DC
  Administered 2017-10-24 – 2017-10-25 (×2): 40 mg via ORAL
  Filled 2017-10-24: qty 2

## 2017-10-24 MED ORDER — APIXABAN 5 MG PO TABS: 5.0000 mg | ORAL_TABLET | Freq: Two times a day (BID) | ORAL | Status: DC

## 2017-10-24 MED ORDER — METOCLOPRAMIDE HCL 5 MG/ML IJ SOLN: 5.0000 mg | Freq: Three times a day (TID) | INTRAMUSCULAR | Status: DC | PRN

## 2017-10-24 MED ORDER — MAGNESIUM CITRATE PO SOLN
1.0000 | Freq: Once | ORAL | Status: AC | PRN
  Administered 2017-10-24: 1 via ORAL
  Filled 2017-10-24 (×2): qty 296

## 2017-10-24 MED ORDER — ONDANSETRON HCL 4 MG/2ML IJ SOLN
4.0000 mg | Freq: Four times a day (QID) | INTRAMUSCULAR | Status: DC | PRN
Start: 2017-10-24 — End: 2017-10-24

## 2017-10-24 MED ORDER — BISACODYL 10 MG RE SUPP: 10.0000 mg | Freq: Every day | RECTAL | Status: DC | PRN

## 2017-10-24 MED ORDER — MAGNESIUM HYDROXIDE 400 MG/5ML PO SUSP: 30.0000 mL | Freq: Every day | ORAL | Status: DC | PRN

## 2017-10-24 MED ORDER — METOCLOPRAMIDE HCL 5 MG PO TABS: 5.0000 mg | ORAL_TABLET | Freq: Three times a day (TID) | ORAL | Status: DC | PRN

## 2017-10-24 MED ORDER — POTASSIUM CHLORIDE CRYS ER 20 MEQ PO TBCR
40.0000 meq | EXTENDED_RELEASE_TABLET | Freq: Once | ORAL | Status: AC
  Administered 2017-10-24: 40 meq via ORAL
  Filled 2017-10-24: qty 2

## 2017-10-24 MED ORDER — MAGNESIUM SULFATE 2 GM/50ML IV SOLN
2.0000 g | Freq: Once | INTRAVENOUS | Status: AC
  Administered 2017-10-24: 2 g via INTRAVENOUS
  Filled 2017-10-24: qty 50

## 2017-10-24 MED ORDER — NITROGLYCERIN 2 % TD OINT
0.5000 [in_us] | TOPICAL_OINTMENT | Freq: Four times a day (QID) | TRANSDERMAL | Status: DC
  Administered 2017-10-24: 0.5 [in_us] via TOPICAL
  Filled 2017-10-24: qty 1

## 2017-10-24 MED ORDER — ENOXAPARIN SODIUM 40 MG/0.4ML ~~LOC~~ SOLN
40.0000 mg | SUBCUTANEOUS | Status: DC
  Administered 2017-10-24: 40 mg via SUBCUTANEOUS
  Filled 2017-10-24 (×2): qty 0.4

## 2017-10-24 NOTE — Anesthesia Preprocedure Evaluation (Signed)
Anesthesia Evaluation  Patient identified by MRN, date of birth, ID band Patient awake    Reviewed: Allergy & Precautions, H&P , NPO status , Patient's Chart, lab work & pertinent test results, reviewed documented beta blocker date and time   Airway Mallampati: II  TM Distance: >3 FB Neck ROM: full    Dental no notable dental hx. (+) Poor Dentition   Pulmonary neg pulmonary ROS, COPD, Current Smoker,    Pulmonary exam normal breath sounds clear to auscultation       Cardiovascular Exercise Tolerance: Good hypertension, + CAD and +CHF  negative cardio ROS Normal cardiovascular exam Rhythm:regular Rate:Normal     Neuro/Psych PSYCHIATRIC DISORDERS Anxiety Depression negative neurological ROS  negative psych ROS   GI/Hepatic negative GI ROS, Neg liver ROS, PUD, GERD  ,  Endo/Other  negative endocrine ROSdiabetes  Renal/GU negative Renal ROS  negative genitourinary   Musculoskeletal   Abdominal   Peds  Hematology negative hematology ROS (+)   Anesthesia Other Findings Past Medical History: No date: Anxiety unk: CAD (coronary artery disease) No date: COPD (chronic obstructive pulmonary disease) (HCC) No date: Depression unk: Hypercholesteremia No date: Hypertension Past Surgical History: 1982: ABDOMINAL HYSTERECTOMY 10/23/2017: KYPHOPLASTY; N/A     Comment:  Procedure: KYPHOPLASTY-L1;  Surgeon: Hessie Knows, MD;               Location: [facility] ORS;  Service: Orthopedics;  Laterality:               N/A; BMI    Body Mass Index:  20.87 kg/m     Reproductive/Obstetrics negative OB ROS                             Anesthesia Physical Anesthesia Plan  ASA: III  Anesthesia Plan: General   Post-op Pain Management:    Induction:   PONV Risk Score and Plan:   Airway Management Planned:   Additional Equipment:   Intra-op Plan:   Post-operative Plan:   Informed Consent: I have  reviewed the patients History and Physical, chart, labs and discussed the procedure including the risks, benefits and alternatives for the proposed anesthesia with the patient or authorized representative who has indicated his/her understanding and acceptance.   Dental Advisory Given  Plan Discussed with: CRNA  Anesthesia Plan Comments:         Anesthesia Quick Evaluation

## 2017-10-24 NOTE — Anesthesia Postprocedure Evaluation (Signed)
Anesthesia Post Note  Patient: [name] [name]  Procedure(s) Performed: Anne Ng (N/A )  Patient location during evaluation: PACU Anesthesia Type: MAC Level of consciousness: awake and alert Pain management: pain level controlled Vital Signs Assessment: post-procedure vital signs reviewed and stable Respiratory status: spontaneous breathing, nonlabored ventilation, respiratory function stable and patient connected to nasal cannula oxygen Cardiovascular status: blood pressure returned to baseline and stable Postop Assessment: no apparent nausea or vomiting Anesthetic complications: no     Last Vitals:  Vitals:   10/24/17 0954 10/24/17 1057  BP: (!) 83/54 (!) 90/45  Pulse:  79  Resp:    Temp:    SpO2:      Last Pain:  Vitals:   10/24/17 0909  TempSrc:   PainSc: 4                  Molli Barrows

## 2017-10-24 NOTE — Clinical Social Work Note (Signed)
CSW received referral for SNF.  Case discussed with case manager and plan is to discharge home with home health.  CSW to sign off please re-consult if social work needs arise.  Faylinn Schwenn R. Edwen Mclester, MSW, LCSWA [phone]  

## 2017-10-24 NOTE — Progress Notes (Signed)
SATURATION QUALIFICATIONS: (This note is used to comply with regulatory documentation for home oxygen)  Patient Saturations on Room Air at Rest = 90%  Patient Saturations on Room Air while Ambulating = 84%  Patient Saturations on 2Liters of oxygen while Ambulating = 94%  Please briefly explain why patient needs home oxygen: 

## 2017-10-24 NOTE — Progress Notes (Addendum)
Patient sts she takes her lisinopril in the morning as soon as she gets up and her metoprolol at night before bed. Her MD told her to do this and she sts it controls her pressure quite well.  Called pharmacy to reschedule her meds this way in hopes we can control her pressure here in the hospital better.

## 2017-10-24 NOTE — Progress Notes (Signed)
MD on call paged for pt's elevated blood pressure.

## 2017-10-24 NOTE — Progress Notes (Signed)
SATURATION QUALIFICATIONS: (This note is used to comply with regulatory documentation for home oxygen)  Patient Saturations on Room Air at Rest =  84%  Patient Saturations on 2 liters at rest 93-94%

## 2017-10-24 NOTE — Evaluation (Signed)
Physical Therapy Evaluation Patient Details Name: [name] [name] MRN: [id number] DOB: [birth year] Today's Date: 10/24/2017   History of Present Illness  presented to ER secondary to L flank pain; admitted with hypokalemia (admission 2.5; current 3.2) and noted with compression fracture to T11 and L1 on imaging.  Initially evaluated 10/21/17 with significant mobility limitations due to back pain/compression fracture.  Now status post L1 kyphoplasty (10/23/17) with orders for re-evaluation.  Clinical Impression  Upon evaluation, patient alert and oriented to basic information; follows all commands and demonstrates fair insight into safety needs.  Marked improvement in pain control with functional activities this date (and denies any radicular symptoms into LEs).  Able to complete bed mobility with mod indep; sit/stand, basic transfers and gait (30-200') with RW, cga for safety. Min cuing for hand placement and walker position/management, but no overt buckling or LOB with mobility efforts.  Do recommend continued use of RW for all mobility at this time. Anticipate need for home O2 upon discharge; will need to continue/integrate education on line management, activity pacing and energy conservation as appropriate. Would benefit from skilled PT to address above deficits and promote optimal return to PLOF; Recommend transition to [facility] upon discharge from acute hospitalization.     Follow Up Recommendations Home health PT    Equipment Recommendations  Rolling walker with 5" wheels    Recommendations for Other Services       Precautions / Restrictions Precautions Precautions: Fall;Back Restrictions Weight Bearing Restrictions: No      Mobility  Bed Mobility Overal bed mobility: Modified Independent             General bed mobility comments: improved speed and fluidity of movement noted  Transfers Overall transfer level: Needs assistance Equipment used: Rolling walker (2  wheeled) Transfers: Sit to/from Stand Sit to Stand: Min guard         General transfer comment: cuing for hand placement; good power/strength in bilat LEs  Ambulation/Gait Ambulation/Gait assistance: Min guard Ambulation Distance (Feet): 30 Feet Assistive device: Rolling walker (2 wheeled)     Gait velocity interpretation: Below normal speed for age/gender General Gait Details: reciprocal stepping pattern with fair cadence/gait speed; completes head turns and changes of direction without buckling or LOB. No significant increase in pain with mobility efforts.  Stairs            Wheelchair Mobility    Modified Rankin (Stroke Patients Only)       Balance Overall balance assessment: Needs assistance Sitting-balance support: No upper extremity supported;Feet supported Sitting balance-Leahy Scale: Good     Standing balance support: Bilateral upper extremity supported Standing balance-Leahy Scale: Fair                               Pertinent Vitals/Pain Pain Assessment: Faces Faces Pain Scale: Hurts little more Pain Location: back, abdomen Pain Descriptors / Indicators: Pressure Pain Intervention(s): Limited activity within patient's tolerance;Monitored during session;Repositioned    Home Living Family/patient expects to be discharged to:: Private residence Living Arrangements: Alone Available Help at Discharge: Family;Available PRN/intermittently Type of Home: House Home Access: Stairs to enter Entrance Stairs-Rails: Right Entrance Stairs-Number of Steps: 3 Home Layout: One level        Prior Function Level of Independence: Independent         Comments: Indep with ADLs, household and community mobilization; no home O2.     Hand Dominance  Extremity/Trunk Assessment   Upper Extremity Assessment Upper Extremity Assessment: Overall WFL for tasks assessed    Lower Extremity Assessment Lower Extremity Assessment: Overall WFL for  tasks assessed(grossly at least 4-/5 throughout)       Communication   Communication: No difficulties  Cognition Arousal/Alertness: Awake/alert Behavior During Therapy: WFL for tasks assessed/performed Overall Cognitive Status: Within Functional Limits for tasks assessed                                        General Comments      Exercises Other Exercises Other Exercises: Additional 200' gait with RW, cga-completes full lap around nursing station without buckling, LOB or pain reported.  Min cuing for walker position and pushing vs lifting.  Do recommend continued use of RW for optimal safety. Other Exercises: Reviewed role and importance of activity pacing upon discharge; introduced importance of awareness of/management of O2 tubing; encouraged to be mindful of pets and minimize their walking around her feet (to reduce fall risk). Patient voiced understanding; will contniue to reinforce   Assessment/Plan    PT Assessment Patient needs continued PT services  PT Problem List Decreased strength;Decreased mobility;Decreased balance;Decreased knowledge of use of DME;Decreased activity tolerance;Decreased cognition;Cardiopulmonary status limiting activity;Decreased range of motion       PT Treatment Interventions DME instruction;Gait training;Stair training;Functional mobility training;Therapeutic activities;Therapeutic exercise;Balance training;Cognitive remediation;Patient/family education    PT Goals (Current goals can be found in the Care Plan section)  Acute Rehab PT Goals Patient Stated Goal: to return home and be with my dogs PT Goal Formulation: With patient Time For Goal Achievement: 11/07/17 Potential to Achieve Goals: Good    Frequency 7X/week   Barriers to discharge Decreased caregiver support      Co-evaluation               AM-PAC PT "6 Clicks" Daily Activity  Outcome Measure Difficulty turning over in bed (including adjusting bedclothes,  sheets and blankets)?: A Little Difficulty moving from lying on back to sitting on the side of the bed? : A Little Difficulty sitting down on and standing up from a chair with arms (e.g., wheelchair, bedside commode, etc,.)?: A Little Help needed moving to and from a bed to chair (including a wheelchair)?: A Little Help needed walking in hospital room?: A Little Help needed climbing 3-5 steps with a railing? : A Little 6 Click Score: 18    End of Session Equipment Utilized During Treatment: Gait belt;Oxygen Activity Tolerance: Patient tolerated treatment well Patient left: in chair;with chair alarm set;with call bell/phone within reach Nurse Communication: Mobility status PT Visit Diagnosis: Muscle weakness (generalized) (M62.81);Unsteadiness on feet (R26.81);Difficulty in walking, not elsewhere classified (R26.2)    Time: 8676-7209 PT Time Calculation (min) (ACUTE ONLY): 29 min   Charges:   PT Evaluation $PT Re-evaluation: 1 Re-eval PT Treatments $Gait Training: 8-22 mins   PT G Codes:        Caylen Yardley H. Owens Shark, PT, DPT, NCS 10/24/17, 2:53 PM [phone]

## 2017-10-24 NOTE — Progress Notes (Signed)
Pharmacy consulted for electrolyte replacement protocol:   Goal of therapy: Electrolytes within normal limits:  Assessment: Lab Results  Component Value Date   CREATININE 0.70 10/24/2017   BUN 11 10/24/2017   NA 134 (L) 10/24/2017   K 3.2 (L) 10/24/2017   CL 97 (L) 10/24/2017   CO2 25 10/24/2017  Mg 1.7   Plan: Magnesium 2 g iv once and KCl 40 meq po once. Will recheck labs in AM.  Ulice Dash, PharmD Clinical Pharmacist  10/24/2017

## 2017-10-24 NOTE — Care Management Important Message (Signed)
Important Message  Patient Details  Name: [name] [name] MRN: [id number] Date of Birth: June 20, 1946   Medicare Important Message Given:  Yes  Signed IM notice given  Katrina Stack, RN 10/24/2017, 11:36 AM

## 2017-10-24 NOTE — Care Management (Signed)
Physical Therapy  has upgraded recommendation to home with home health.  Patient and her daughter happy about recommendation.  Both agreeable to [facility] PT OT and aide.  Discussed the need for home oxygen to both.  Patient says she would rather not have it . CM explained rationale. No agency preference.  Referral for home health and oxygen called to and accepted by [facility] Agency can make initial visit within 24 hours hours of discharge. Obtained order for oxygen.  Anticipate  discharge 10/25/3017.

## 2017-10-24 NOTE — Progress Notes (Signed)
Pt. Has loss of IV access d/t pain at site, she is refusing another IV attempt, pt is AxOx4 says she does not need it, she is going home tomorrow. MD notified.

## 2017-10-24 NOTE — Progress Notes (Signed)
Dr. Marcille Blanco stated the patient can get her lisinopril early and a dose of nitro paste.

## 2017-10-24 NOTE — Progress Notes (Addendum)
[place] at [facility]                                                                                                                                                                                  Patient Demographics   [name] [name], is a 72 y.o. female, DOB - April 18, 1946, WJX:[id number]  Admit date - 10/18/2017   Admitting Physician Saundra Shelling, MD  Outpatient Primary MD for the patient is Mar Daring, PA-C   LOS - 6  Subjective: Patient seen and evaluated by me today Status post a vertebral kyphoplasty yesterday Has improved appetite after megace was started Blood pressure was high last night patient received IV hydralazine No complaints of any chest pain Is on oxygen via nasal cannula at 2 L  Review of Systems:   CONSTITUTIONAL: No documented fever. No fatigue, weakness. No weight gain, no weight loss.  EYES: No blurry or double vision.  ENT: No tinnitus. No postnasal drip. No redness of the oropharynx.  RESPIRATORY: No cough, no wheeze, no hemoptysis. No dyspnea.  CARDIOVASCULAR: No chest pain. No orthopnea. No palpitations. No syncope.  GASTROINTESTINAL: No nausea, no vomiting or diarrhea. No abdominal pain. No melena or hematochezia.  GENITOURINARY: No dysuria or hematuria.  ENDOCRINE: No polyuria or nocturia. No heat or cold intolerance.  HEMATOLOGY: No anemia. No bruising. No bleeding.  INTEGUMENTARY: No rashes. No lesions.  MUSCULOSKELETAL: No arthritis. No swelling. No gout.  Low back pain decreased NEUROLOGIC: No numbness, tingling, or ataxia. No seizure-type activity.  PSYCHIATRIC: No anxiety. No insomnia. No ADD.    Vitals:   Vitals:   10/24/17 0612 10/24/17 0745 10/24/17 0954 10/24/17 1057  BP: (!) 200/75 (!) 198/83 (!) 83/54 (!) 90/45  Pulse: 81 80  79  Resp:  18    Temp:  98.6 F (37 C)    TempSrc:  Oral    SpO2:  96%    Weight:      Height:        Wt Readings from Last 3 Encounters:  10/24/17 53.4 kg (117 lb  12.8 oz)  09/26/17 55.8 kg (123 lb)  08/15/17 59.1 kg (130 lb 6.4 oz)     Intake/Output Summary (Last 24 hours) at 10/24/2017 1251 Last data filed at 10/23/2017 2250 Gross per 24 hour  Intake 1099 ml  Output 1 ml  Net 1098 ml    Physical Exam:   GENERAL: Pleasant-appearing elderly patient in no apparent distress.  HEAD, EYES, EARS, NOSE AND THROAT: Atraumatic, normocephalic. Extraocular muscles are intact. Pupils equal and reactive to light. Sclerae anicteric. No conjunctival injection. No oro-pharyngeal erythema.  NECK: Supple. There is no jugular venous distention.  No bruits, no lymphadenopathy, no thyromegaly.  HEART: Regular rate and rhythm,. No murmurs, no rubs, no clicks.  LUNGS: Clear to auscultation bilaterally. No rales or rhonchi. No wheezes.  ABDOMEN: Soft, flat, nontender, nondistended. Has good bowel sounds. No hepatosplenomegaly appreciated.  EXTREMITIES: No evidence of any cyanosis, clubbing, or peripheral edema.  +2 pedal and radial pulses bilaterally.  NEUROLOGIC: The patient is alert, awake, and oriented x3 with no focal motor or sensory deficits appreciated bilaterally. Decreased tenderness lower back. SKIN: Moist and warm with no rashes appreciated.  Psych: Not anxious, depressed LN: No inguinal LN enlargement    Antibiotics   Anti-infectives (From admission, onward)   Start     Dose/Rate Route Frequency Ordered Stop   10/23/17 1300  ceFAZolin (ANCEF) powder 1 g     1 g Other To Surgery 10/22/17 1228 10/23/17 1356   10/23/17 1300  ceFAZolin (ANCEF) 1-4 GM/50ML-% IVPB    Note to Pharmacy:  Algis Liming   : cabinet override      10/23/17 1300 10/24/17 0114      Medications   Scheduled Meds: . aspirin EC  81 mg Oral Daily  . citalopram  20 mg Oral Daily  . docusate sodium  100 mg Oral BID  . enoxaparin (LOVENOX) injection  40 mg Subcutaneous Q24H  . lisinopril  40 mg Oral Daily  . megestrol  400 mg Oral Daily  . metoprolol succinate  50 mg Oral QHS   . mometasone-formoterol  2 puff Inhalation BID  . pantoprazole  40 mg Oral QAC breakfast  . senna  1 tablet Oral Daily  . simvastatin  10 mg Oral Daily  . sodium chloride flush  3 mL Intravenous Q12H  . sodium chloride flush  3 mL Intravenous Q12H  . tiotropium  1 capsule Inhalation Daily   Continuous Infusions:  PRN Meds:.acetaminophen **OR** acetaminophen, albuterol, ALPRAZolam, alum & mag hydroxide-simeth, bisacodyl, bisacodyl, hydrALAZINE, HYDROcodone-acetaminophen, magnesium hydroxide, metoCLOPramide **OR** metoCLOPramide (REGLAN) injection, ondansetron **OR** ondansetron (ZOFRAN) IV, senna-docusate, sodium chloride, sodium chloride flush   Data Review:   Micro Results Recent Results (from the past 240 hour(s))  MRSA PCR Screening     Status: None   Collection Time: 10/23/17  4:40 AM  Result Value Ref Range Status   MRSA by PCR NEGATIVE NEGATIVE Final    Comment:        The GeneXpert MRSA Assay (FDA approved for NASAL specimens only), is one component of a comprehensive MRSA colonization surveillance program. It is not intended to diagnose MRSA infection nor to guide or monitor treatment for MRSA infections. Performed at [facility], [address], [place], [place] [address]     Radiology Reports Dg Chest 2 View  Result Date: 10/19/2017 CLINICAL DATA:  Cough for several days, fever, COPD, coronary artery disease, smoker EXAM: CHEST - 2 VIEW COMPARISON:  10/18/2017 FINDINGS: Enlargement of cardiac silhouette with pulmonary vascular congestion. Atherosclerotic calcification aorta. New bibasilar atelectasis. Accentuation of interstitial markings since previous exam could be related to mild hypoinflation or developing mild interstitial infiltrate/edema. Questionable small RIGHT pleural effusion. No pneumothorax. Bones demineralized with chronic compression deformity of L1 vertebral body. IMPRESSION: Enlargement of cardiac silhouette with pulmonary vascular  congestion. Bibasilar atelectasis with increased interstitial prominence question due to hypoinflation or interstitial infiltrate/edema. Electronically Signed   By: Lavonia Dana M.D.   On: 10/19/2017 16:19   Dg Chest 2 View  Result Date: 10/18/2017 CLINICAL DATA:  Cough.  Intermittent low-grade fever. EXAM: CHEST - 2  VIEW COMPARISON:  Chest x-ray dated February 09, 2015. FINDINGS: Borderline cardiomegaly, unchanged. Normal pulmonary vascularity. Atherosclerotic calcification of the aortic arch. The lungs remain mildly hyperinflated. No focal consolidation, pleural effusion, or pneumothorax. No acute osseous abnormality. Old left-sided rib fractures. IMPRESSION: COPD.  No active cardiopulmonary disease. Electronically Signed   By: Titus Dubin M.D.   On: 10/18/2017 10:34   Dg Lumbar Spine 2-3 Views  Result Date: 10/23/2017 CLINICAL DATA:  Kyphoplasty. EXAM: DG C-ARM 61-120 MIN; LUMBAR SPINE - 2-3 VIEW COMPARISON:  10/19/2017. FINDINGS: Kyphoplasty upper lumbar vertebral body. Methylmethacrylate noted within the body and adjacent disc space. One image obtained. 1 minutes 55 seconds fluoroscopy time. IMPRESSION: Kyphoplasty upper lumbar vertebral body. Electronically Signed   By: Marcello Moores  Register   On: 10/23/2017 15:45   Ct Angio Chest Pe W And/or Wo Contrast  Result Date: 10/18/2017 CLINICAL DATA:  Cough for 2 weeks EXAM: CT ANGIOGRAPHY CHEST WITH CONTRAST TECHNIQUE: Multidetector CT imaging of the chest was performed using the standard protocol during bolus administration of intravenous contrast. Multiplanar CT image reconstructions and MIPs were obtained to evaluate the vascular anatomy. CONTRAST:  52mL ISOVUE-370 IOPAMIDOL (ISOVUE-370) INJECTION 76% COMPARISON:  04/03/2011 FINDINGS: Cardiovascular: There are no filling defects in the pulmonary arterial tree to suggest acute pulmonary thromboembolism. Atherosclerotic calcifications of the thoracic aorta are noted. Maximal diameter of the ascending aorta is  3.4 cm. Maximal diameter of the descending aorta is 4.0 cm. Atherosclerotic calcifications in the great vessels are noted. There is no obvious dissection or intramural hematoma. Atherosclerotic changes in the aorta continuing the abdomen with irregular calcified plaque. Moderate 3 vessel coronary artery calcifications. Extensive hypertrophy of the left ventricle myocardium. Mitral valve and annular calcifications are present. Mediastinum/Nodes: No pericardial effusion. Thyroid is atrophic. No abnormal mediastinal adenopathy. Unremarkable esophagus. Lungs/Pleura: Severe emphysema dependent atelectasis at the lung bases. No pneumothorax. No pleural effusion. Musculoskeletal: New T11 and L1 compression fractures compared with a lateral radiograph dated 02/09/2015. At T11, the superior endplate compression results in 10% loss of height anteriorly. At L1, there is severe height loss and some retropulsion of the superior endplate. Review of the MIP images confirms the above findings. IMPRESSION: No acute pulmonary thromboembolism. 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 Left ventricular hypertrophy. T11 and L1 compression fractures. Aortic Atherosclerosis (ICD10-I70.0) and Emphysema (ICD10-J43.9). Electronically Signed   By: Marybelle Killings M.D.   On: 10/18/2017 12:10   Mr Lumbar Spine Wo Contrast  Result Date: 10/19/2017 CLINICAL DATA:  Low back pain following injury. EXAM: MRI LUMBAR SPINE WITHOUT CONTRAST TECHNIQUE: Multiplanar, multisequence MR imaging of the lumbar spine was performed. No intravenous contrast was administered. COMPARISON:  CT abdomen pelvis 10/18/2017. FINDINGS: Segmentation: Conventional anatomy assumed, with the last open disc space designated L5-S1. Alignment: Similar to recent CT with a mild convex left scoliosis. There is a degenerative grade 1 anterolisthesis at L4-5. Vertebrae: There is an old healed mild superior endplate compression deformity at T11. A more  severe biconcave compression fracture at L1 is associated with 3 mm of osseous retropulsion and mild residual marrow edema. The posterior elements are intact. No evidence of acute fracture or pars defect. The visualized sacroiliac joints appear unremarkable. Conus medullaris: Extends to the L1 level and appears normal. Fatty filum noted. Paraspinal and other soft tissues: Small left renal cysts are stable. Disc levels: Sagittal images demonstrate mild disc degeneration from T10-11 through T12-L1. No resulting spinal stenosis or nerve root encroachment. L1-2: Disc height and hydration are  largely maintained. There is mild disc bulging. No spinal stenosis or nerve root encroachment. L2-3: Mild disc bulging eccentric to the left with mild facet and ligamentous hypertrophy. Mild spinal stenosis. No nerve root encroachment. L3-4: Mild disc bulging, facet and ligamentous hypertrophy. Mild spinal stenosis. No nerve root encroachment. L4-5: There is loss of disc height with annular disc bulging eccentric to the right. There is advanced facet hypertrophy accounting for the grade 1 anterolisthesis. These factors contribute to moderate spinal stenosis with asymmetric narrowing of the right lateral recess and right foramen. There is possible encroachment on the right L4 and L5 nerve roots. L5-S1: Disc height and hydration are maintained. Mild facet hypertrophy, worse on the left. No significant spinal stenosis or nerve root encroachment. IMPRESSION: 1. Largely healed L1 biconcave compression fracture with osseous retropulsion and mild residual marrow edema. Old healed T11 superior endplate compression deformity. No acute osseous findings. 2. Moderate multifactorial spinal stenosis at L4-5, secondary to annular disc bulging, facet hypertrophy and resulting grade 1 anterolisthesis. There is asymmetric narrowing of the right lateral recess and right foramen with possible encroachment on the right L4 and L5 nerve roots. 3. Mild  multifactorial spinal stenosis at L2-3 and L3-4 without nerve root encroachment. Electronically Signed   By: Richardean Sale M.D.   On: 10/19/2017 12:48   Dg C-arm 1-60 Min  Result Date: 10/23/2017 CLINICAL DATA:  Kyphoplasty. EXAM: DG C-ARM 61-120 MIN; LUMBAR SPINE - 2-3 VIEW COMPARISON:  10/19/2017. FINDINGS: Kyphoplasty upper lumbar vertebral body. Methylmethacrylate noted within the body and adjacent disc space. One image obtained. 1 minutes 55 seconds fluoroscopy time. IMPRESSION: Kyphoplasty upper lumbar vertebral body. Electronically Signed   By: Marcello Moores  Register   On: 10/23/2017 15:45   Ct Renal Stone Study  Result Date: 10/18/2017 CLINICAL DATA:  Left flank pain for the past 5 days. EXAM: CT ABDOMEN AND PELVIS WITHOUT CONTRAST TECHNIQUE: Multidetector CT imaging of the abdomen and pelvis was performed following the standard protocol without IV contrast. COMPARISON:  Abdomen ultrasound dated 12/08/2013. FINDINGS: Lower chest: Prominent pulmonary vasculature at the lung bases. Minimal pericardial fluid with a maximum thickness of 6 mm. No pleural fluid. Coronary artery calcifications. Dense mitral valve annulus calcifications. Hepatobiliary: No focal liver abnormality is seen. No gallstones, gallbladder wall thickening, or biliary dilatation. Pancreas: Diffuse pancreatic atrophy. Spleen: Normal in size without focal abnormality. Adrenals/Urinary Tract: The previously demonstrated 2.0 cm septated cyst in the midpole of the left kidney is grossly stable, measuring 1.9 x 0.9 cm on image number 24 series 2. There is also a 1.1 cm exophytic probable cyst measuring up to 14 Hounsfield units in density arising from the upper pole of the left kidney. There is also a 9 mm exophytic probable cyst measuring up to 15 Hounsfield units in density arising from the lower pole of the right kidney. Normal appearing ureters, urinary bladder and adrenal glands. No urinary tract calculi or hydronephrosis. Stomach/Bowel:  Large number of sigmoid and descending colon diverticula. Normal appearing appendix, stomach and small bowel with the exception of an elongated fat density mass in a loop of jejunum in the left mid abdomen, measuring 2.7 x 1.0 cm in maximum dimensions on image number 64 series 5. Vascular/Lymphatic: Atheromatous arterial calcifications without aneurysm. No enlarged lymph nodes. Reproductive: Status post hysterectomy. No adnexal masses. Other: No abdominal wall hernia or abnormality. No abdominopelvic ascites. Musculoskeletal: Approximately 70% L1 vertebral compression deformity with mild bony retropulsion and no acute fracture lines visualized. There is also an approximately 25%  T11 vertebral body superior endplate compression deformity with mild Schmorl's node formation and no acute fracture lines. Facet degenerative changes in the mid and lower lumbar spine with associated grade 1 anterolisthesis at the L4-5 level. No pars defects. Mild to moderate anterior spur formation at multiple levels of the lumbar and lower thoracic spine. IMPRESSION: 1. No urinary tract calculi or hydronephrosis. 2. Small probable mildly complicated cysts arising from the upper pole of the left kidney and lower pole of the right kidney, not seen at previous ultrasound. A repeat right renal ultrasound is recommended to help determine if these are cystic or solid. 3. Pulmonary vascular congestion. 4. Minimal pericardial effusion. 5. Atheromatous arterial calcifications, including the coronary arteries. 6. Extensive sigmoid and descending colon diverticulosis. 7. Old T11 and L1 vertebral compression fractures. 8. 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 Electronically Signed   By: Claudie Revering M.D.   On: 10/18/2017 11:09     CBC Recent Labs  Lab 10/18/17 0958 10/19/17 0415 10/20/17 0318 10/22/17 0719  WBC 9.3 6.3 6.9 5.1  HGB 15.3 12.7 11.7* 13.0  HCT 46.1 38.1 35.7 39.6  PLT 286 214  199 216  MCV 96.4 97.1 97.9 97.1  MCH 32.1 32.4 32.0 31.9  MCHC 33.3 33.3 32.7 32.9  RDW 13.5 13.6 13.4 13.7  LYMPHSABS 1.0  --   --   --   MONOABS 0.5  --   --   --   EOSABS 0.1  --   --   --   BASOSABS 0.1  --   --   --     Chemistries  Recent Labs  Lab 10/18/17 0958 10/19/17 0415 10/20/17 0318 10/21/17 0424 10/22/17 0719 10/24/17 0420  NA 137 139 138 137 136 134*  K 2.5* 3.3* 3.0* 4.0 4.1 3.2*  CL 88* 100* 102 101 98* 97*  CO2 30 30 26 27 27 25   GLUCOSE 130* 94 111* 108* 112* 101*  BUN 22* 25* 21* 13 13 11   CREATININE 1.20* 1.43* 0.92 0.79 0.80 0.70  CALCIUM 9.3 8.2* 8.6* 8.8* 8.8* 8.5*  MG  --   --  1.4* 1.5* 1.7 1.4*  AST 27  --   --   --   --   --   ALT 11*  --   --   --   --   --   ALKPHOS 99  --   --   --   --   --   BILITOT 1.1  --   --   --   --   --    ------------------------------------------------------------------------------------------------------------------ estimated creatinine clearance is 53.4 mL/min (by C-G formula based on SCr of 0.7 mg/dL). ------------------------------------------------------------------------------------------------------------------ No results for input(s): HGBA1C in the last 72 hours. ------------------------------------------------------------------------------------------------------------------ No results for input(s): CHOL, HDL, LDLCALC, TRIG, CHOLHDL, LDLDIRECT in the last 72 hours. ------------------------------------------------------------------------------------------------------------------ No results for input(s): TSH, T4TOTAL, T3FREE, THYROIDAB in the last 72 hours.  Invalid input(s): FREET3 ------------------------------------------------------------------------------------------------------------------ No results for input(s): VITAMINB12, FOLATE, FERRITIN, TIBC, IRON, RETICCTPCT in the last 72 hours.  Coagulation profile No results for input(s): INR, PROTIME in the last 168 hours.  No results for input(s):  DDIMER in the last 72 hours.  Cardiac Enzymes Recent Labs  Lab 10/18/17 1636 10/18/17 2111 10/19/17 0415  TROPONINI 0.06* 0.07* 0.07*   ------------------------------------------------------------------------------------------------------------------ Invalid input(s): POCBNP    Assessment & Plan   72 year old female patient with history of emphysema, coronary artery disease, anxiety disorder, hyperlipidemia, hypertension  presented to the emergency room with low back pain secondary to vertebral compression fractures  1.  Acute on chronic low back pain secondary to compression fractures  Status post vertebral kyphoplasty yesterday Patient tolerated procedure well Currently receiving physical therapy  2 hypotension this a.m. DC Nitropaste Continue oral lisinopril and metoprolol with holding parameters Add IV hydralazine prn for better control of blood pressure only when blood pressure is elevated more than 160/90  3.copd Discussed with patient and daughter Has history of emphysema according to the family On oxygen via nasal cannula, oxygen could not be weaned CT angiogram negative for acute pulmonary embolism  4.Elevated troponin secondary to demand ischemia Troponin trend 0.06-0.07-0.07  5.  Constipation improved Continue stool softeners  6.Descending thoracic aortic aneurysm Periodicimaging as outpatient by primary care physician  7.  DVT prophylaxis subcu Lovenox 40 mg daily  8.  Hypokalemia Replace potassium orally.  9.  Disposition , discharge tomorrow to home depending on clinical condition          Code Status Orders  (From admission, onward)        Start     Ordered   10/18/17 1629  Full code  Continuous     10/18/17 1628    Code Status History    This patient has a current code status but no historical code status.     Time Spent in minutes   35 minutes  Greater than 50% of time spent in care coordination and counseling  patient regarding the condition and plan of care.   Saundra Shelling M.D on 10/24/2017 at 12:51 PM  Between 7am to 6pm - Pager - [phone]  After 6pm go to [email or web address] - Proofreader  [employer]   Office  [phone]

## 2017-10-25 LAB — BASIC METABOLIC PANEL
ANION GAP: 11 (ref 5–15)
BUN: 13 mg/dL (ref 6–20)
CHLORIDE: 97 mmol/L — AB (ref 101–111)
CO2: 27 mmol/L (ref 22–32)
Calcium: 8.6 mg/dL — ABNORMAL LOW (ref 8.9–10.3)
Creatinine, Ser: 0.74 mg/dL (ref 0.44–1.00)
GFR calc Af Amer: >60 mL/min (ref >60)
Glucose, Bld: 95 mg/dL (ref 65–99)
POTASSIUM: 4 mmol/L (ref 3.5–5.1)
SODIUM: 135 mmol/L (ref 135–145)

## 2017-10-25 LAB — MAGNESIUM: Magnesium: 2.3 mg/dL (ref 1.7–2.4)

## 2017-10-25 MED ORDER — HYDROCODONE-ACETAMINOPHEN 5-325 MG PO TABS: 1.0000 | ORAL_TABLET | ORAL | 0 refills | Status: DC | PRN

## 2017-10-25 MED ORDER — CITALOPRAM HYDROBROMIDE 20 MG PO TABS: 20.0000 mg | ORAL_TABLET | Freq: Every day | ORAL | 0 refills | Status: DC

## 2017-10-25 MED ORDER — ALPRAZOLAM 0.25 MG PO TABS: 0.2500 mg | ORAL_TABLET | Freq: Two times a day (BID) | ORAL | 0 refills | Status: DC | PRN

## 2017-10-25 MED ORDER — MEGESTROL ACETATE 400 MG/10ML PO SUSP: 400.0000 mg | Freq: Every day | ORAL | 0 refills | Status: DC

## 2017-10-25 MED ORDER — MOMETASONE FURO-FORMOTEROL FUM 200-5 MCG/ACT IN AERO: 2.0000 | INHALATION_SPRAY | Freq: Two times a day (BID) | RESPIRATORY_TRACT | 0 refills | Status: DC

## 2017-10-25 NOTE — Progress Notes (Signed)
[place] at [facility]                                                                                                                                                                                  Patient Demographics   [name] [name], is a 72 y.o. female, DOB - 1946-03-01, WEX:[id number]  Admit date - 10/18/2017   Admitting Physician Saundra Shelling, MD  Outpatient Primary MD for the patient is Mar Daring, PA-C   LOS - 7   Discharge home.  Discharging home with oxygen.  Review of Systems:   CONSTITUTIONAL: No documented fever. No fatigue, weakness. No weight gain, no weight loss.  EYES: No blurry or double vision.  ENT: No tinnitus. No postnasal drip. No redness of the oropharynx.  RESPIRATORY: No cough, no wheeze, no hemoptysis. No dyspnea.  CARDIOVASCULAR: No chest pain. No orthopnea. No palpitations. No syncope.  GASTROINTESTINAL: No nausea, no vomiting or diarrhea. No abdominal pain. No melena or hematochezia.  GENITOURINARY: No dysuria or hematuria.  ENDOCRINE: No polyuria or nocturia. No heat or cold intolerance.  HEMATOLOGY: No anemia. No bruising. No bleeding.  INTEGUMENTARY: No rashes. No lesions.  MUSCULOSKELETAL: No arthritis. No swelling. No gout.  Low back pain decreased NEUROLOGIC: No numbness, tingling, or ataxia. No seizure-type activity.  PSYCHIATRIC: No anxiety. No insomnia. No ADD.    Vitals:   Vitals:   10/25/17 0522 10/25/17 0752 10/25/17 0930 10/25/17 0950  BP: (!) 155/62 (!) 167/94 (!) 178/106 (!) 177/77  Pulse: 85 81 95 79  Resp: 18 18    Temp: 98.2 F (36.8 C) 98.2 F (36.8 C)    TempSrc: Oral Oral    SpO2: 96% 97% 97%   Weight: 52.8 kg (116 lb 8 oz)     Height:        Wt Readings from Last 3 Encounters:  10/25/17 52.8 kg (116 lb 8 oz)  09/26/17 55.8 kg (123 lb)  08/15/17 59.1 kg (130 lb 6.4 oz)     Intake/Output Summary (Last 24 hours) at 10/25/2017 1020 Last data filed at 10/25/2017 0950 Gross per  24 hour  Intake 240 ml  Output -  Net 240 ml    Physical Exam:   GENERAL: Pleasant-appearing elderly patient in no apparent distress.  HEAD, EYES, EARS, NOSE AND THROAT: Atraumatic, normocephalic. Extraocular muscles are intact. Pupils equal and reactive to light. Sclerae anicteric. No conjunctival injection. No oro-pharyngeal erythema.  NECK: Supple. There is no jugular venous distention. No bruits, no lymphadenopathy, no thyromegaly.  HEART: Regular rate and rhythm,. No murmurs, no rubs, no clicks.  LUNGS: Clear to auscultation bilaterally. No rales or rhonchi. No wheezes.  ABDOMEN: Soft, flat, nontender, nondistended. Has good bowel sounds. No hepatosplenomegaly appreciated.  EXTREMITIES: No evidence of any cyanosis, clubbing, or peripheral edema.  +2 pedal and radial pulses bilaterally.  NEUROLOGIC: The patient is alert, awake, and oriented x3 with no focal motor or sensory deficits appreciated bilaterally. Decreased tenderness lower back. SKIN: Moist and warm with no rashes appreciated.  Psych: Not anxious, depressed LN: No inguinal LN enlargement    Antibiotics   Anti-infectives (From admission, onward)   Start     Dose/Rate Route Frequency Ordered Stop   10/23/17 1300  ceFAZolin (ANCEF) powder 1 g     1 g Other To Surgery 10/22/17 1228 10/23/17 1356   10/23/17 1300  ceFAZolin (ANCEF) 1-4 GM/50ML-% IVPB    Note to Pharmacy:  Algis Liming   : cabinet override      10/23/17 1300 10/24/17 0114      Medications   Scheduled Meds: . aspirin EC  81 mg Oral Daily  . citalopram  20 mg Oral Daily  . enoxaparin (LOVENOX) injection  40 mg Subcutaneous Q24H  . lisinopril  40 mg Oral Daily  . megestrol  400 mg Oral Daily  . metoprolol succinate  50 mg Oral QHS  . mometasone-formoterol  2 puff Inhalation BID  . pantoprazole  40 mg Oral QAC breakfast  . simvastatin  10 mg Oral Daily  . sodium chloride flush  3 mL Intravenous Q12H  . sodium chloride flush  3 mL Intravenous Q12H   . tiotropium  1 capsule Inhalation Daily   Continuous Infusions:  PRN Meds:.acetaminophen **OR** acetaminophen, albuterol, ALPRAZolam, alum & mag hydroxide-simeth, hydrALAZINE, HYDROcodone-acetaminophen, magnesium hydroxide, metoCLOPramide **OR** metoCLOPramide (REGLAN) injection, ondansetron **OR** ondansetron (ZOFRAN) IV, sodium chloride, sodium chloride flush   Data Review:   Micro Results Recent Results (from the past 240 hour(s))  MRSA PCR Screening     Status: None   Collection Time: 10/23/17  4:40 AM  Result Value Ref Range Status   MRSA by PCR NEGATIVE NEGATIVE Final    Comment:        The GeneXpert MRSA Assay (FDA approved for NASAL specimens only), is one component of a comprehensive MRSA colonization surveillance program. It is not intended to diagnose MRSA infection nor to guide or monitor treatment for MRSA infections. Performed at [facility], [address], [place], [place] [address]     Radiology Reports Dg Chest 2 View  Result Date: 10/19/2017 CLINICAL DATA:  Cough for several days, fever, COPD, coronary artery disease, smoker EXAM: CHEST - 2 VIEW COMPARISON:  10/18/2017 FINDINGS: Enlargement of cardiac silhouette with pulmonary vascular congestion. Atherosclerotic calcification aorta. New bibasilar atelectasis. Accentuation of interstitial markings since previous exam could be related to mild hypoinflation or developing mild interstitial infiltrate/edema. Questionable small RIGHT pleural effusion. No pneumothorax. Bones demineralized with chronic compression deformity of L1 vertebral body. IMPRESSION: Enlargement of cardiac silhouette with pulmonary vascular congestion. Bibasilar atelectasis with increased interstitial prominence question due to hypoinflation or interstitial infiltrate/edema. Electronically Signed   By: Lavonia Dana M.D.   On: 10/19/2017 16:19   Dg Chest 2 View  Result Date: 10/18/2017 CLINICAL DATA:  Cough.  Intermittent low-grade  fever. EXAM: CHEST - 2 VIEW COMPARISON:  Chest x-ray dated February 09, 2015. FINDINGS: Borderline cardiomegaly, unchanged. Normal pulmonary vascularity. Atherosclerotic calcification of the aortic arch. The lungs remain mildly hyperinflated. No focal consolidation, pleural effusion, or pneumothorax. No acute osseous abnormality. Old left-sided rib fractures. IMPRESSION: COPD.  No active cardiopulmonary disease. Electronically Signed  By: Titus Dubin M.D.   On: 10/18/2017 10:34   Dg Lumbar Spine 2-3 Views  Result Date: 10/23/2017 CLINICAL DATA:  Kyphoplasty. EXAM: DG C-ARM 61-120 MIN; LUMBAR SPINE - 2-3 VIEW COMPARISON:  10/19/2017. FINDINGS: Kyphoplasty upper lumbar vertebral body. Methylmethacrylate noted within the body and adjacent disc space. One image obtained. 1 minutes 55 seconds fluoroscopy time. IMPRESSION: Kyphoplasty upper lumbar vertebral body. Electronically Signed   By: Marcello Moores  Register   On: 10/23/2017 15:45   Ct Angio Chest Pe W And/or Wo Contrast  Result Date: 10/18/2017 CLINICAL DATA:  Cough for 2 weeks EXAM: CT ANGIOGRAPHY CHEST WITH CONTRAST TECHNIQUE: Multidetector CT imaging of the chest was performed using the standard protocol during bolus administration of intravenous contrast. Multiplanar CT image reconstructions and MIPs were obtained to evaluate the vascular anatomy. CONTRAST:  71mL ISOVUE-370 IOPAMIDOL (ISOVUE-370) INJECTION 76% COMPARISON:  04/03/2011 FINDINGS: Cardiovascular: There are no filling defects in the pulmonary arterial tree to suggest acute pulmonary thromboembolism. Atherosclerotic calcifications of the thoracic aorta are noted. Maximal diameter of the ascending aorta is 3.4 cm. Maximal diameter of the descending aorta is 4.0 cm. Atherosclerotic calcifications in the great vessels are noted. There is no obvious dissection or intramural hematoma. Atherosclerotic changes in the aorta continuing the abdomen with irregular calcified plaque. Moderate 3 vessel coronary  artery calcifications. Extensive hypertrophy of the left ventricle myocardium. Mitral valve and annular calcifications are present. Mediastinum/Nodes: No pericardial effusion. Thyroid is atrophic. No abnormal mediastinal adenopathy. Unremarkable esophagus. Lungs/Pleura: Severe emphysema dependent atelectasis at the lung bases. No pneumothorax. No pleural effusion. Musculoskeletal: New T11 and L1 compression fractures compared with a lateral radiograph dated 02/09/2015. At T11, the superior endplate compression results in 10% loss of height anteriorly. At L1, there is severe height loss and some retropulsion of the superior endplate. Review of the MIP images confirms the above findings. IMPRESSION: No acute pulmonary thromboembolism. 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 Left ventricular hypertrophy. T11 and L1 compression fractures. Aortic Atherosclerosis (ICD10-I70.0) and Emphysema (ICD10-J43.9). Electronically Signed   By: Marybelle Killings M.D.   On: 10/18/2017 12:10   Mr Lumbar Spine Wo Contrast  Result Date: 10/19/2017 CLINICAL DATA:  Low back pain following injury. EXAM: MRI LUMBAR SPINE WITHOUT CONTRAST TECHNIQUE: Multiplanar, multisequence MR imaging of the lumbar spine was performed. No intravenous contrast was administered. COMPARISON:  CT abdomen pelvis 10/18/2017. FINDINGS: Segmentation: Conventional anatomy assumed, with the last open disc space designated L5-S1. Alignment: Similar to recent CT with a mild convex left scoliosis. There is a degenerative grade 1 anterolisthesis at L4-5. Vertebrae: There is an old healed mild superior endplate compression deformity at T11. A more severe biconcave compression fracture at L1 is associated with 3 mm of osseous retropulsion and mild residual marrow edema. The posterior elements are intact. No evidence of acute fracture or pars defect. The visualized sacroiliac joints appear unremarkable. Conus medullaris: Extends to the L1  level and appears normal. Fatty filum noted. Paraspinal and other soft tissues: Small left renal cysts are stable. Disc levels: Sagittal images demonstrate mild disc degeneration from T10-11 through T12-L1. No resulting spinal stenosis or nerve root encroachment. L1-2: Disc height and hydration are largely maintained. There is mild disc bulging. No spinal stenosis or nerve root encroachment. L2-3: Mild disc bulging eccentric to the left with mild facet and ligamentous hypertrophy. Mild spinal stenosis. No nerve root encroachment. L3-4: Mild disc bulging, facet and ligamentous hypertrophy. Mild spinal stenosis. No nerve root encroachment. L4-5: There is  loss of disc height with annular disc bulging eccentric to the right. There is advanced facet hypertrophy accounting for the grade 1 anterolisthesis. These factors contribute to moderate spinal stenosis with asymmetric narrowing of the right lateral recess and right foramen. There is possible encroachment on the right L4 and L5 nerve roots. L5-S1: Disc height and hydration are maintained. Mild facet hypertrophy, worse on the left. No significant spinal stenosis or nerve root encroachment. IMPRESSION: 1. Largely healed L1 biconcave compression fracture with osseous retropulsion and mild residual marrow edema. Old healed T11 superior endplate compression deformity. No acute osseous findings. 2. Moderate multifactorial spinal stenosis at L4-5, secondary to annular disc bulging, facet hypertrophy and resulting grade 1 anterolisthesis. There is asymmetric narrowing of the right lateral recess and right foramen with possible encroachment on the right L4 and L5 nerve roots. 3. Mild multifactorial spinal stenosis at L2-3 and L3-4 without nerve root encroachment. Electronically Signed   By: Richardean Sale M.D.   On: 10/19/2017 12:48   Dg C-arm 1-60 Min  Result Date: 10/23/2017 CLINICAL DATA:  Kyphoplasty. EXAM: DG C-ARM 61-120 MIN; LUMBAR SPINE - 2-3 VIEW COMPARISON:   10/19/2017. FINDINGS: Kyphoplasty upper lumbar vertebral body. Methylmethacrylate noted within the body and adjacent disc space. One image obtained. 1 minutes 55 seconds fluoroscopy time. IMPRESSION: Kyphoplasty upper lumbar vertebral body. Electronically Signed   By: Marcello Moores  Register   On: 10/23/2017 15:45   Ct Renal Stone Study  Result Date: 10/18/2017 CLINICAL DATA:  Left flank pain for the past 5 days. EXAM: CT ABDOMEN AND PELVIS WITHOUT CONTRAST TECHNIQUE: Multidetector CT imaging of the abdomen and pelvis was performed following the standard protocol without IV contrast. COMPARISON:  Abdomen ultrasound dated 12/08/2013. FINDINGS: Lower chest: Prominent pulmonary vasculature at the lung bases. Minimal pericardial fluid with a maximum thickness of 6 mm. No pleural fluid. Coronary artery calcifications. Dense mitral valve annulus calcifications. Hepatobiliary: No focal liver abnormality is seen. No gallstones, gallbladder wall thickening, or biliary dilatation. Pancreas: Diffuse pancreatic atrophy. Spleen: Normal in size without focal abnormality. Adrenals/Urinary Tract: The previously demonstrated 2.0 cm septated cyst in the midpole of the left kidney is grossly stable, measuring 1.9 x 0.9 cm on image number 24 series 2. There is also a 1.1 cm exophytic probable cyst measuring up to 14 Hounsfield units in density arising from the upper pole of the left kidney. There is also a 9 mm exophytic probable cyst measuring up to 15 Hounsfield units in density arising from the lower pole of the right kidney. Normal appearing ureters, urinary bladder and adrenal glands. No urinary tract calculi or hydronephrosis. Stomach/Bowel: Large number of sigmoid and descending colon diverticula. Normal appearing appendix, stomach and small bowel with the exception of an elongated fat density mass in a loop of jejunum in the left mid abdomen, measuring 2.7 x 1.0 cm in maximum dimensions on image number 64 series 5.  Vascular/Lymphatic: Atheromatous arterial calcifications without aneurysm. No enlarged lymph nodes. Reproductive: Status post hysterectomy. No adnexal masses. Other: No abdominal wall hernia or abnormality. No abdominopelvic ascites. Musculoskeletal: Approximately 70% L1 vertebral compression deformity with mild bony retropulsion and no acute fracture lines visualized. There is also an approximately 25% T11 vertebral body superior endplate compression deformity with mild Schmorl's node formation and no acute fracture lines. Facet degenerative changes in the mid and lower lumbar spine with associated grade 1 anterolisthesis at the L4-5 level. No pars defects. Mild to moderate anterior spur formation at multiple levels of the lumbar and lower  thoracic spine. IMPRESSION: 1. No urinary tract calculi or hydronephrosis. 2. Small probable mildly complicated cysts arising from the upper pole of the left kidney and lower pole of the right kidney, not seen at previous ultrasound. A repeat right renal ultrasound is recommended to help determine if these are cystic or solid. 3. Pulmonary vascular congestion. 4. Minimal pericardial effusion. 5. Atheromatous arterial calcifications, including the coronary arteries. 6. Extensive sigmoid and descending colon diverticulosis. 7. Old T11 and L1 vertebral compression fractures. 8. 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 Electronically Signed   By: Claudie Revering M.D.   On: 10/18/2017 11:09     CBC Recent Labs  Lab 10/19/17 0415 10/20/17 0318 10/22/17 0719  WBC 6.3 6.9 5.1  HGB 12.7 11.7* 13.0  HCT 38.1 35.7 39.6  PLT 214 199 216  MCV 97.1 97.9 97.1  MCH 32.4 32.0 31.9  MCHC 33.3 32.7 32.9  RDW 13.6 13.4 13.7    Chemistries  Recent Labs  Lab 10/20/17 0318 10/21/17 0424 10/22/17 0719 10/24/17 0420 10/25/17 0434  NA 138 137 136 134* 135  K 3.0* 4.0 4.1 3.2* 4.0  CL 102 101 98* 97* 97*  CO2 26 27 27 25 27   GLUCOSE  111* 108* 112* 101* 95  BUN 21* 13 13 11 13   CREATININE 0.92 0.79 0.80 0.70 0.74  CALCIUM 8.6* 8.8* 8.8* 8.5* 8.6*  MG 1.4* 1.5* 1.7 1.4* 2.3   ------------------------------------------------------------------------------------------------------------------ estimated creatinine clearance is 53.4 mL/min (by C-G formula based on SCr of 0.74 mg/dL). ------------------------------------------------------------------------------------------------------------------ No results for input(s): HGBA1C in the last 72 hours. ------------------------------------------------------------------------------------------------------------------ No results for input(s): CHOL, HDL, LDLCALC, TRIG, CHOLHDL, LDLDIRECT in the last 72 hours. ------------------------------------------------------------------------------------------------------------------ No results for input(s): TSH, T4TOTAL, T3FREE, THYROIDAB in the last 72 hours.  Invalid input(s): FREET3 ------------------------------------------------------------------------------------------------------------------ No results for input(s): VITAMINB12, FOLATE, FERRITIN, TIBC, IRON, RETICCTPCT in the last 72 hours.  Coagulation profile No results for input(s): INR, PROTIME in the last 168 hours.  No results for input(s): DDIMER in the last 72 hours.  Cardiac Enzymes Recent Labs  Lab 10/18/17 1636 10/18/17 2111 10/19/17 0415  TROPONINI 0.06* 0.07* 0.07*   ------------------------------------------------------------------------------------------------------------------ Invalid input(s): POCBNP    Assessment & Plan   72 year old female patient with history of emphysema, coronary artery disease, anxiety disorder, hyperlipidemia, hypertension presented to the emergency room with low back pain secondary to vertebral compression fractures  1.  Acute on chronic low back pain secondary to compression fractures  Status post vertebral kyphoplasty  yesterday Patient tolerated procedure well Currently receiving physical therapy, discharged home with home health physical therapy today.  2/hypertension: Uncontrolled at times and hypotensive yesterday, now patient is stressed about therapy and also multiple people visiting her room today morning told her she can go home and relax patient can take Xanax at home and also BP medicines except Lasix.  She says Lasix she takes as needed  3.copd Discussed with patient and daughter Has history of emphysema according to the family On oxygen via nasal cannula, oxygen could not be weaned CT angiogram negative for acute pulmonary embolism  4.Elevated troponin secondary to demand ischemia Troponin trend 0.06-0.07-0.07  5.  Constipation improved Continue stool softeners  6.Descending thoracic aortic aneurysm Periodicimaging as outpatient by primary care physician  7.  DVT prophylaxis subcu Lovenox 40 mg daily  8.  Hypokalemia   9 stable for discharge today with home health physical therapy, oxygen.  Discussed with patient's daughter in detail.  Code Status Orders  (From admission, onward)        Start     Ordered   10/18/17 1629  Full code  Continuous     10/18/17 1628    Code Status History    This patient has a current code status but no historical code status.     Time Spent in minutes   35 minutes  Greater than 50% of time spent in care coordination and counseling patient regarding the condition and plan of care.   Epifanio Lesches M.D on 10/25/2017 at 10:20 AM  Between 7am to 6pm - Pager - [phone]  After 6pm go to [email or web address] - Proofreader  [employer]   Office  [phone]

## 2017-10-25 NOTE — Progress Notes (Signed)
Pharmacy consulted for electrolyte replacement protocol:   Goal of therapy: Electrolytes within normal limits:  Assessment: Lab Results  Component Value Date   CREATININE 0.74 10/25/2017   BUN 13 10/25/2017   NA 135 10/25/2017   K 4.0 10/25/2017   CL 97 (L) 10/25/2017   CO2 27 10/25/2017  Mg 1.7   Plan: Electrolytes are stable. No replacement needed. Pt likely to d/c home today. Pharmacy to sign off. Reconsult if needed  Ramond Dial, Pharm.D, BCPS Clinical Pharmacist  10/25/2017

## 2017-10-25 NOTE — Progress Notes (Signed)
Physical Therapy Treatment Patient Details Name: [name] [name] MRN: [id number] DOB: 04-25-1946 Today's Date: 10/25/2017    History of Present Illness presented to ER secondary to L flank pain; admitted with hypokalemia (admission 2.5; current 3.2) and noted with compression fracture to T11 and L1 on imaging.  Initially evaluated 10/21/17 with significant mobility limitations due to back pain/compression fracture.  Now status post L1 kyphoplasty (10/23/17) with orders for re-evaluation.    PT Comments    Pt in bed.  BP 179/79.  Pt anxious to get up and walk and BP within parameters for therapy.  Hopeful for discharge today but stated she slept poorly due to frequent BM's.  Transitioned in and out bed with ease.  She was able to stand with verbal cues and ambulate around nursing unit x 1 and stop in bathroom to void.  She did voice some back pain with gait.  Dry heaving at times on commode and upon return to bed but pt stated it is common for her.  BP checked upon return to supine 178/106 P 95 O2 97%.  Primary RN notified.  Stated she had not had her BP meds yet this am.    Discussed discharge plan with pt.  She stated she feels confident returning home despite being home alone.  She does need further education on use of O2 tank and cord management.   Follow Up Recommendations  Home health PT     Equipment Recommendations       Recommendations for Other Services       Precautions / Restrictions Precautions Precautions: Fall;Back    Mobility  Bed Mobility Overal bed mobility: Modified Independent                Transfers Overall transfer level: Needs assistance Equipment used: Rolling walker (2 wheeled) Transfers: Sit to/from Stand Sit to Stand: Min guard         General transfer comment: cuing for hand placement; good power/strength in bilat LEs  Ambulation/Gait Ambulation/Gait assistance: Min guard Ambulation Distance (Feet): 160 Feet Assistive device: Rolling walker  (2 wheeled) Gait Pattern/deviations: Step-through pattern;Decreased step length - right;Decreased step length - left   Gait velocity interpretation: Below normal speed for age/gender     Stairs            Wheelchair Mobility    Modified Rankin (Stroke Patients Only)       Balance Overall balance assessment: Needs assistance Sitting-balance support: No upper extremity supported;Feet supported Sitting balance-Leahy Scale: Good     Standing balance support: Bilateral upper extremity supported Standing balance-Leahy Scale: Fair                              Cognition Arousal/Alertness: Awake/alert Behavior During Therapy: WFL for tasks assessed/performed Overall Cognitive Status: Within Functional Limits for tasks assessed                                        Exercises Other Exercises Other Exercises: to bathroom. Other Exercises: reviewed O2 cord monitoring and awareness with gait.    General Comments        Pertinent Vitals/Pain Pain Assessment: 0-10 Pain Score: 3  Pain Location: back Pain Descriptors / Indicators: Sore Pain Intervention(s): Limited activity within patient's tolerance    Home Living  Prior Function            PT Goals (current goals can now be found in the care plan section) Progress towards PT goals: Progressing toward goals    Frequency    7X/week      PT Plan Current plan remains appropriate    Co-evaluation              AM-PAC PT "6 Clicks" Daily Activity  Outcome Measure  Difficulty turning over in bed (including adjusting bedclothes, sheets and blankets)?: None Difficulty moving from lying on back to sitting on the side of the bed? : None Difficulty sitting down on and standing up from a chair with arms (e.g., wheelchair, bedside commode, etc,.)?: A Little Help needed moving to and from a bed to chair (including a wheelchair)?: A Little Help needed  walking in hospital room?: A Little Help needed climbing 3-5 steps with a railing? : A Little 6 Click Score: 20    End of Session Equipment Utilized During Treatment: Gait belt;Oxygen Activity Tolerance: Patient tolerated treatment well;Other (comment) Patient left: in bed;with call bell/phone within reach Nurse Communication: Other (comment)       Time: 0165-5374 PT Time Calculation (min) (ACUTE ONLY): 18 min  Charges:  $Gait Training: 8-22 mins                    G Codes:       Chesley Noon, PTA 10/25/17, 9:37 AM

## 2017-10-25 NOTE — Progress Notes (Signed)
Pt discharged. Telemetry removed, discharge instructions and prescriptions given to patient. Walker and home o2 went home with patient. Med Education, and education on how to use O2 device given to patient and pt's daughter before discharge. Pt had no IV access. Left floor in wheelchair, escorted by aide.

## 2017-10-25 NOTE — Progress Notes (Signed)
Patient is having frequent bowel movements after  taking laxatives and stool softeners. Last two BMs were watery. Dr. Darvin Neighbours notified and he D/C those medications. Will continue to monitor

## 2017-10-25 NOTE — Progress Notes (Signed)
Dr. Vianne Bulls aware of pts blood pressure. No new or prn  medications to be given at this time. Dr. Vianne Bulls stated patient is discharging.

## 2017-10-25 NOTE — Care Management (Signed)
Discharge to home today per Dr. Vianne Bulls. Brenton Grills, [place] representative updated.  Nurse updated Shelbie Ammons RN MSN CCM Care Management [phone]

## 2017-10-27 ENCOUNTER — Telehealth: Payer: Self-pay | Admitting: Physician Assistant

## 2017-10-27 ENCOUNTER — Telehealth: Payer: Self-pay

## 2017-10-27 DIAGNOSIS — I2510 Atherosclerotic heart disease of native coronary artery without angina pectoris: Secondary | ICD-10-CM | POA: Diagnosis not present

## 2017-10-27 DIAGNOSIS — F419 Anxiety disorder, unspecified: Secondary | ICD-10-CM | POA: Diagnosis not present

## 2017-10-27 DIAGNOSIS — J439 Emphysema, unspecified: Secondary | ICD-10-CM | POA: Diagnosis not present

## 2017-10-27 DIAGNOSIS — N281 Cyst of kidney, acquired: Secondary | ICD-10-CM | POA: Diagnosis not present

## 2017-10-27 DIAGNOSIS — F17210 Nicotine dependence, cigarettes, uncomplicated: Secondary | ICD-10-CM | POA: Diagnosis not present

## 2017-10-27 DIAGNOSIS — I712 Thoracic aortic aneurysm, without rupture: Secondary | ICD-10-CM | POA: Diagnosis not present

## 2017-10-27 DIAGNOSIS — F329 Major depressive disorder, single episode, unspecified: Secondary | ICD-10-CM | POA: Diagnosis not present

## 2017-10-27 DIAGNOSIS — E876 Hypokalemia: Secondary | ICD-10-CM | POA: Diagnosis not present

## 2017-10-27 DIAGNOSIS — S32010D Wedge compression fracture of first lumbar vertebra, subsequent encounter for fracture with routine healing: Secondary | ICD-10-CM | POA: Diagnosis not present

## 2017-10-27 DIAGNOSIS — I10 Essential (primary) hypertension: Secondary | ICD-10-CM | POA: Diagnosis not present

## 2017-10-27 DIAGNOSIS — E785 Hyperlipidemia, unspecified: Secondary | ICD-10-CM | POA: Diagnosis not present

## 2017-10-27 DIAGNOSIS — Z9981 Dependence on supplemental oxygen: Secondary | ICD-10-CM | POA: Diagnosis not present

## 2017-10-27 DIAGNOSIS — S22080D Wedge compression fracture of T11-T12 vertebra, subsequent encounter for fracture with routine healing: Secondary | ICD-10-CM | POA: Diagnosis not present

## 2017-10-27 NOTE — Telephone Encounter (Signed)
Transition Care Management Follow-Up Telephone Call   Date discharged and where: [facility] on 10/25/17.  How have you been since you were released from the hospital? Doing ok, still in pain and is paranoid about hurting herself by falling. Pain is in the lower abdomen and stomach. Pt described it as a cramping feeling. Pain level currently is at a 6. Pt states she has been slightly nausea too. Declines v/d or fever.   Any patient concerns? If pt will get completely well and go back to being selfsufficient.  Items Reviewed:   Meds: verified  Allergies: verified  Dietary Changes Reviewed: low sodium, heart healthy  Functional Questionnaire:  Independent-I Dependent-D  ADLs:   Dressing- I    Eating- I   Maintaining continence- I   Transferring- I   Transportation- I, not currently while on Hydrocodone.   Meal Prep- I, though not currently.   Managing Meds- I  Confirmed importance and Date/Time of follow-up visits scheduled: 10/28/17 @ 11:00 AM   Confirmed with patient if condition worsens to call PCP or go to the Emergency Dept. Patient was given office number and encouraged to call back with questions or concerns: YES

## 2017-10-27 NOTE — Telephone Encounter (Signed)
LMTCB  Thanks,  -Riddick Nuon 

## 2017-10-27 NOTE — Telephone Encounter (Signed)
Verbal approval for nursing 2 week 1, 1 week 3 and 1 every other week for 4 weeks and home health aide 2 weeks for 4 weeks.

## 2017-10-27 NOTE — Telephone Encounter (Signed)
Yes this is ok 

## 2017-10-27 NOTE — Telephone Encounter (Signed)
Please Review

## 2017-10-28 ENCOUNTER — Encounter: Payer: Self-pay | Admitting: Physician Assistant

## 2017-10-28 ENCOUNTER — Ambulatory Visit: Payer: Medicare Other | Admitting: Physician Assistant

## 2017-10-28 VITALS — BP 160/80 | HR 66 | Temp 97.7°F | Resp 16 | Ht 63.0 in | Wt 118.0 lb

## 2017-10-28 DIAGNOSIS — S32010D Wedge compression fracture of first lumbar vertebra, subsequent encounter for fracture with routine healing: Secondary | ICD-10-CM

## 2017-10-28 DIAGNOSIS — I7123 Aneurysm of the descending thoracic aorta, without rupture: Secondary | ICD-10-CM | POA: Insufficient documentation

## 2017-10-28 DIAGNOSIS — S32010A Wedge compression fracture of first lumbar vertebra, initial encounter for closed fracture: Secondary | ICD-10-CM

## 2017-10-28 DIAGNOSIS — I712 Thoracic aortic aneurysm, without rupture: Secondary | ICD-10-CM | POA: Diagnosis not present

## 2017-10-28 DIAGNOSIS — J418 Mixed simple and mucopurulent chronic bronchitis: Secondary | ICD-10-CM

## 2017-10-28 DIAGNOSIS — Z9189 Other specified personal risk factors, not elsewhere classified: Secondary | ICD-10-CM | POA: Diagnosis not present

## 2017-10-28 DIAGNOSIS — S22080A Wedge compression fracture of T11-T12 vertebra, initial encounter for closed fracture: Secondary | ICD-10-CM | POA: Insufficient documentation

## 2017-10-28 DIAGNOSIS — K5903 Drug induced constipation: Secondary | ICD-10-CM | POA: Diagnosis not present

## 2017-10-28 DIAGNOSIS — T402X5A Adverse effect of other opioids, initial encounter: Secondary | ICD-10-CM

## 2017-10-28 DIAGNOSIS — J90 Pleural effusion, not elsewhere classified: Secondary | ICD-10-CM | POA: Diagnosis not present

## 2017-10-28 DIAGNOSIS — S22000A Wedge compression fracture of unspecified thoracic vertebra, initial encounter for closed fracture: Secondary | ICD-10-CM | POA: Insufficient documentation

## 2017-10-28 DIAGNOSIS — IMO0001 Reserved for inherently not codable concepts without codable children: Secondary | ICD-10-CM

## 2017-10-28 DIAGNOSIS — S22080D Wedge compression fracture of T11-T12 vertebra, subsequent encounter for fracture with routine healing: Secondary | ICD-10-CM

## 2017-10-28 DIAGNOSIS — N281 Cyst of kidney, acquired: Secondary | ICD-10-CM | POA: Insufficient documentation

## 2017-10-28 DIAGNOSIS — J449 Chronic obstructive pulmonary disease, unspecified: Secondary | ICD-10-CM

## 2017-10-28 NOTE — Progress Notes (Signed)
Patient: [name] [name] Female    DOB: 26-Oct-1945   72 y.o.   MRN: [id number] Visit Date: 10/28/2017  Today's Provider: Mar Daring, PA-C   Chief Complaint  Patient presents with  . Hospitalization Follow-up   Subjective:    HPI  Follow up Hospitalization  Patient here today with her daughter [name] [name]. Patient was admitted to [facility] on 10/18/17 and discharged on 10/25/17. She was treated for Left flank pain, hypoxic, low potassium. Treatment for this included CT scan, oxygen and one oral dose of potassium 40 MEQ. She was found to have an acute fracture of her T11-L1 spine. She underwent kyphoplasty with Dr. Rudene Christians while inpatient.  Telephone follow up was done on 10/27/17. She reports excellent compliance with treatment. She reports this condition is Improved. Patient reports she has been using Norco 1 tablet every 4 hours due to pain. Patient reports pain is never gone completely even with medication. ------------------------------------------------------------------------------------ Patient had surgery on 10/23/17 with Dr. Rudene Christians, patient reports she was not instructed on how to take care of incision site.  Patient reports that she her oxygen level was 81% on Sunday, pt reports she did have her oxygen on her through out the night but that the tank was empty and she did not realize. Patient reports that it took about 30 minutes for level to go up to 96%.     No Known Allergies   Current Outpatient Medications:  .  albuterol (PROVENTIL HFA;VENTOLIN HFA) 108 (90 Base) MCG/ACT inhaler, Inhale 2 puffs into the lungs every 4 (four) hours as needed for wheezing or shortness of breath., Disp: 1 Inhaler, Rfl: 1 .  ALPRAZolam (XANAX) 0.5 MG tablet, Take 1 tablet (0.5 mg total) by mouth 2 (two) times daily as needed. for anxiety, Disp: 60 tablet, Rfl: 5 .  aspirin 81 MG tablet, Take 81 mg by mouth daily., Disp: , Rfl:  .  citalopram (CELEXA) 20 MG tablet, Take 1 tablet (20 mg total) by  mouth daily., Disp: 30 tablet, Rfl: 0 .  Fluticasone-Salmeterol (ADVAIR) 250-50 MCG/DOSE AEPB, Inhale 1 puff into the lungs 2 (two) times daily., Disp: 14 each, Rfl: 1 .  furosemide (LASIX) 20 MG tablet, Take 20 mg by mouth daily. , Disp: , Rfl:  .  HYDROcodone-acetaminophen (NORCO/VICODIN) 5-325 MG tablet, Take 1 tablet by mouth every 4 (four) hours as needed for moderate pain., Disp: 30 tablet, Rfl: 0 .  lisinopril (PRINIVIL,ZESTRIL) 40 MG tablet, Take 40 mg by mouth daily. , Disp: , Rfl:  .  metoprolol succinate (TOPROL-XL) 50 MG 24 hr tablet, Take 50 mg by mouth every evening. Take with or immediately following a meal., Disp: , Rfl:  .  mometasone-formoterol (DULERA) 200-5 MCG/ACT AERO, Inhale 2 puffs into the lungs 2 (two) times daily., Disp: 13 g, Rfl: 0 .  potassium chloride SA (K-DUR,KLOR-CON) 20 MEQ tablet, Take 1 tablet (20 mEq total) by mouth daily., Disp: 90 tablet, Rfl: 1 .  simvastatin (ZOCOR) 10 MG tablet, Take 1 tablet (10 mg total) by mouth daily., Disp: 90 tablet, Rfl: 3 .  Tiotropium Bromide Monohydrate (SPIRIVA RESPIMAT) 1.25 MCG/ACT AERS, Inhale 1 puff into the lungs daily., Disp: 4 g, Rfl: 5  Review of Systems  Constitutional: Negative.   Respiratory: Positive for shortness of breath.   Cardiovascular: Negative.   Gastrointestinal: Positive for constipation.  Musculoskeletal: Positive for back pain.  Neurological: Positive for weakness. Negative for dizziness, light-headedness, numbness and headaches.    Social History  Tobacco Use  . Smoking status: Former Smoker    Packs/day: 0.50    Years: 50.00    Pack years: 25.00    Last attempt to quit: 10/18/2017    Years since quitting: 0.0  . Smokeless tobacco: Never Used  Substance Use Topics  . Alcohol use: Yes    Alcohol/week: 4.2 oz    Types: 7 Glasses of wine per week    Comment: 1-2 glasses wine daily   Objective:   BP (!) 160/80 (BP Location: Left Arm, Patient Position: Sitting, Cuff Size: Normal)   Pulse  66   Temp 97.7 F (36.5 C) (Oral)   Resp 16   Ht 5\' 3"  (1.6 m)   Wt 118 lb (53.5 kg)   SpO2 99% Comment: 2 liters of oxygen  BMI 20.90 kg/m  Vitals:   10/28/17 1127  BP: (!) 160/80  Pulse: 66  Resp: 16  Temp: 97.7 F (36.5 C)  TempSrc: Oral  SpO2: 99%  Weight: 118 lb (53.5 kg)  Height: 5\' 3"  (1.6 m)     Physical Exam  Constitutional: She appears well-developed and well-nourished. No distress.  Neck: Normal range of motion. Neck supple. No tracheal deviation present. No thyromegaly present.  Cardiovascular: Normal rate, regular rhythm and normal heart sounds. Exam reveals no gallop and no friction rub.  No murmur heard. Pulmonary/Chest: Effort normal and breath sounds normal. No respiratory distress. She has no wheezes. She has no rales.  Abdominal: Soft. Bowel sounds are normal. She exhibits no distension and no mass. There is tenderness in the left upper quadrant and left lower quadrant. There is no rebound and no guarding.  Musculoskeletal: She exhibits no edema.  Lymphadenopathy:    She has no cervical adenopathy.  Skin: She is not diaphoretic.  Vitals reviewed.       Assessment & Plan:     1. Transition of care performed with sharing of clinical summary Hospital notes, images, labs and consults all reviewed from hospitalization from 10/18/17-10/26/17. TCC done on 10/27/17.  2. Pleural effusion Lung sounds clear today. O2 improving.   3. Mixed simple and mucopurulent chronic bronchitis (HCC) Continue inhalers as prescribed. Patient able to ambulate today x 3 min and O2 stayed between 92-95% RA. Ok to go to as needed use for home O2. If improved at next 2-3 week f/u will obtain PFT. Patient agrees.   4. Closed compression fracture of thoracolumbar vertebra with routine healing, subsequent encounter S/P Kyphoplasty. Still having pain. Suspect pain more related to constipation at this time. Kyphoplasty incision site healing well. No signs of infection. May wash with  soap and water.   5. Renal cyst Noted on CT during hospitalization. New from previous imaging in 2016. Recommended renal US to make sure complex cyst and not solid. [place] ordered as below.  - US Renal; Future  6. Chronic airflow limitation (HCC) See above medical treatment plan for #3.   7. Therapeutic opioid induced constipation Sample of Movantik given to patient x 6 days. She is to call if still having bowel issues. She is to call if medication works so we can send in refill or let us know if she would rather try colace instead. Patient also needs to reschedule colonoscopy but we will address at f/u if she is improving.   8. 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 Will need f/u imaging in 6 months.        Mar Daring, PA-C  [facility]

## 2017-10-28 NOTE — Telephone Encounter (Signed)
Verbal orders give to [facility].  Thanks,  -Joseline

## 2017-10-29 ENCOUNTER — Telehealth: Payer: Self-pay | Admitting: Physician Assistant

## 2017-10-29 NOTE — Telephone Encounter (Signed)
[name] [name] with [employer] care called needs verbal Pt Orders 1 time a week for 1 week, 2 times a week and 1 week for 2 weeks  Also needs clarification on her oxygen orders  Her call back is [phone]  Thanks  teri

## 2017-10-29 NOTE — Telephone Encounter (Signed)
Raquel Sarna advised as directed below.  Thanks,  -Joseline

## 2017-10-29 NOTE — Telephone Encounter (Signed)
[place] for PT orders.  Patient may discontinue continuous oxygen. May use prn for when O2 sats drop below 90% and at bedtime.

## 2017-10-31 ENCOUNTER — Telehealth: Payer: Self-pay | Admitting: Physician Assistant

## 2017-10-31 NOTE — Telephone Encounter (Signed)
Esther from [facility] is requesting verbal orders for OT for 2 w 2.

## 2017-11-02 NOTE — Discharge Summary (Signed)
[name] [name], is a 72 y.o. female  DOB May 31, 1946  MRN [id number].  Admission date:  10/18/2017  Admitting Physician  Saundra Shelling, MD  Discharge Date:  10/25/2017   Primary MD  Mar Daring, PA-C  Recommendations for primary care physician for things to follow:   Follow with PCP in 1 week Follow-up with Dr. Rudene Christians in 2 weeks   Admission Diagnosis  Abnormal EKG [R94.31] Elevated troponin [R74.8] Left flank pain [R10.9] Vertebral compression fracture (HCC) [G29.52WU] Systolic congestive heart failure, unspecified HF chronicity (HCC) [I50.20]   Discharge Diagnosis  Abnormal EKG [R94.31] Elevated troponin [R74.8] Left flank pain [R10.9] Vertebral compression fracture (HCC) [X32.44WN] Systolic congestive heart failure, unspecified HF chronicity (HCC) [I50.20]    Active Problems:   Hypokalemia      Past Medical History:  Diagnosis Date  . Anxiety   . CAD (coronary artery disease) unk  . COPD (chronic obstructive pulmonary disease) ([place])   . Depression   . Hypercholesteremia unk  . Hypertension     Past Surgical History:  Procedure Laterality Date  . ABDOMINAL HYSTERECTOMY  1982  . KYPHOPLASTY N/A 10/23/2017   Procedure: [id number];  Surgeon: Hessie Knows, MD;  Location: [facility] ORS;  Service: Orthopedics;  Laterality: N/A;       History of present illness and  Hospital Course:     Kindly see H&P for history of present illness and admission details, please review complete Labs, Consult reports and Test reports for all details in brief  HPI  from the history and physical done on the day of admission 72 year old female patient with history of emphysema, CAD, hyperlipidemia, hypertension came into hospital because of right flank pain.,  Patient found to have L1 compression fracture.   Hospital  Course   Acute on chronic low back pain secondary to L1 compression fracture status post kyphoplasty, patient has degenerative joint disease especially on the MRI of the back.  Seen by Dr. Rudene Christians.  After kyphoplasty patient did well, discharged home with home health physical therapy.  Patient did not require much of narcotics.  Take Tylenol for pain control.  #2. essential hypertension, patient was hypotensive transiently during hospital stay, patient received lisinopril, metoprolol during the hospital stay and we continued that. 3.  COPD, lives with the patient, patient on oxygen at home, she can continue with that.  She has emphysema.  She had CT Angie of the chest here which did not show any PE.  Patient can continue her home dose inhalers with Spiriva, Advair. 4.  Elevated troponins likely secondary to demand ischemia.  Patient received Nitropaste during the hospital stay but it caused her to have hypotension so we started Nitropaste, patient troponins slightly elevated up to 0.07.  Did not have any chest pain or EKG changes. 5.  Constipation, received stool softener symptoms improved. 6.  History of descending thoracic aortic aneurysm, pediatric imaging as outpatient by PCP. 7.  Hypokalemia: Replaced.,  Improved, potassium 4 at the time of discharge. #8. orthopedic consult history of anxiety, patient can continue Xanax.   Discharge Condition: Stable   Follow UP  Follow-up Information    Mar Daring, PA-C. Schedule an appointment as soon as possible for a visit in 1 week(s).   Specialty:  Family Medicine Contact information: [place] [address] [phone]             Discharge Instructions  and  Discharge Medications   Patient can continue her Helyn Numbers,  Discharge Instructions    Face-to-face encounter (required for Medicare/Medicaid patients)   Complete by:  As directed    I Epifanio Lesches certify that this patient is  under my care and that I, or a nurse practitioner or physician's assistant working with me, had a face-to-face encounter that meets the physician face-to-face encounter requirements with this patient on 10/25/2017. The encounter with the patient was in whole, or in part for the following medical condition(s) which is the primary reason for home health care  Recent kyphoplasty Hypotension hypokalemia   The encounter with the patient was in whole, or in part, for the following medical condition, which is the primary reason for home health care:  whole   I certify that, based on my findings, the following services are medically necessary home health services:   Nursing Physical therapy     Reason for Medically Necessary Home Health Services:  Therapy- Personnel officer, Public librarian   My clinical findings support the need for the above services:  Unable to leave home safely without assistance and/or assistive device   Further, I certify that my clinical findings support that this patient is homebound due to:  Unable to leave home safely without assistance   For home use only DME oxygen   Complete by:  As directed    Mode or (Route):  Nasal cannula   Liters per Minute:  2   Frequency:  Continuous (stationary and portable oxygen unit needed)   Oxygen conserving device:  Yes   Oxygen delivery system:  Gas   Home Health   Complete by:  As directed    To provide the following care/treatments:   PT [place] OT Respiratory Care Social work       Allergies as of 10/25/2017   No Known Allergies     Medication List    STOP taking these medications   pantoprazole 40 MG tablet Commonly known as:  PROTONIX   polyethylene glycol powder powder Commonly known as:  GLYCOLAX/MIRALAX   predniSONE 10 MG (21) Tbpk tablet Commonly known as:  STERAPRED UNI-PAK 21 TAB   triamcinolone cream 0.1 % Commonly known as:  KENALOG     TAKE these medications   albuterol 108  (90 Base) MCG/ACT inhaler Commonly known as:  PROVENTIL HFA;VENTOLIN HFA Inhale 2 puffs into the lungs every 4 (four) hours as needed for wheezing or shortness of breath.   ALPRAZolam 0.5 MG tablet Commonly known as:  XANAX Take 1 tablet (0.5 mg total) by mouth 2 (two) times daily as needed. for anxiety   aspirin 81 MG tablet Take 81 mg by mouth daily.   citalopram 20 MG tablet Commonly known as:  CELEXA Take 1 tablet (20 mg total) by mouth daily. What changed:  additional instructions   Fluticasone-Salmeterol 250-50 MCG/DOSE Aepb Commonly known as:  ADVAIR Inhale 1 puff into the lungs 2 (two) times daily.   furosemide 20 MG tablet Commonly known as:  LASIX Take 20 mg by mouth daily.   HYDROcodone-acetaminophen 5-325 MG tablet Commonly known as:  NORCO/VICODIN Take 1 tablet by mouth every 4 (four) hours as needed for moderate pain.   lisinopril 40 MG tablet Commonly known as:  PRINIVIL,ZESTRIL Take 40 mg by mouth daily.   metoprolol succinate 50 MG 24 hr tablet Commonly known as:  TOPROL-XL Take 50 mg by mouth every evening. Take with or immediately following a meal.   mometasone-formoterol 200-5 MCG/ACT Aero Commonly known as:  DULERA  Inhale 2 puffs into the lungs 2 (two) times daily.   potassium chloride SA 20 MEQ tablet Commonly known as:  K-DUR,KLOR-CON Take 1 tablet (20 mEq total) by mouth daily.   simvastatin 10 MG tablet Commonly known as:  ZOCOR Take 1 tablet (10 mg total) by mouth daily.   Tiotropium Bromide Monohydrate 1.25 MCG/ACT Aers Commonly known as:  SPIRIVA RESPIMAT Inhale 1 puff into the lungs daily.            Durable Medical Equipment  (From admission, onward)        Start     Ordered   10/25/17 0000  For home use only DME oxygen    Question Answer Comment  Mode or (Route) Nasal cannula   Liters per Minute 2   Frequency Continuous (stationary and portable oxygen unit needed)   Oxygen conserving device Yes   Oxygen delivery  system Gas      10/25/17 0719        Diet and Activity recommendation: See Discharge Instructions above   Consults obtained -orthopedic consult, physical therapy consult   Major procedures and Radiology Reports - PLEASE review detailed and final reports for all details, in brief -      Dg Chest 2 View  Result Date: 10/19/2017 CLINICAL DATA:  Cough for several days, fever, COPD, coronary artery disease, smoker EXAM: CHEST - 2 VIEW COMPARISON:  10/18/2017 FINDINGS: Enlargement of cardiac silhouette with pulmonary vascular congestion. Atherosclerotic calcification aorta. New bibasilar atelectasis. Accentuation of interstitial markings since previous exam could be related to mild hypoinflation or developing mild interstitial infiltrate/edema. Questionable small RIGHT pleural effusion. No pneumothorax. Bones demineralized with chronic compression deformity of L1 vertebral body. IMPRESSION: Enlargement of cardiac silhouette with pulmonary vascular congestion. Bibasilar atelectasis with increased interstitial prominence question due to hypoinflation or interstitial infiltrate/edema. Electronically Signed   By: Lavonia Dana M.D.   On: 10/19/2017 16:19   Dg Chest 2 View  Result Date: 10/18/2017 CLINICAL DATA:  Cough.  Intermittent low-grade fever. EXAM: CHEST - 2 VIEW COMPARISON:  Chest x-ray dated February 09, 2015. FINDINGS: Borderline cardiomegaly, unchanged. Normal pulmonary vascularity. Atherosclerotic calcification of the aortic arch. The lungs remain mildly hyperinflated. No focal consolidation, pleural effusion, or pneumothorax. No acute osseous abnormality. Old left-sided rib fractures. IMPRESSION: COPD.  No active cardiopulmonary disease. Electronically Signed   By: Titus Dubin M.D.   On: 10/18/2017 10:34   Dg Lumbar Spine 2-3 Views  Result Date: 10/23/2017 CLINICAL DATA:  Kyphoplasty. EXAM: DG C-ARM 61-120 MIN; LUMBAR SPINE - 2-3 VIEW COMPARISON:  10/19/2017. FINDINGS: Kyphoplasty upper  lumbar vertebral body. Methylmethacrylate noted within the body and adjacent disc space. One image obtained. 1 minutes 55 seconds fluoroscopy time. IMPRESSION: Kyphoplasty upper lumbar vertebral body. Electronically Signed   By: Marcello Moores  Register   On: 10/23/2017 15:45   Ct Angio Chest Pe W And/or Wo Contrast  Result Date: 10/18/2017 CLINICAL DATA:  Cough for 2 weeks EXAM: CT ANGIOGRAPHY CHEST WITH CONTRAST TECHNIQUE: Multidetector CT imaging of the chest was performed using the standard protocol during bolus administration of intravenous contrast. Multiplanar CT image reconstructions and MIPs were obtained to evaluate the vascular anatomy. CONTRAST:  39mL ISOVUE-370 IOPAMIDOL (ISOVUE-370) INJECTION 76% COMPARISON:  04/03/2011 FINDINGS: Cardiovascular: There are no filling defects in the pulmonary arterial tree to suggest acute pulmonary thromboembolism. Atherosclerotic calcifications of the thoracic aorta are noted. Maximal diameter of the ascending aorta is 3.4 cm. Maximal diameter of the descending aorta is 4.0 cm. Atherosclerotic calcifications in  the great vessels are noted. There is no obvious dissection or intramural hematoma. Atherosclerotic changes in the aorta continuing the abdomen with irregular calcified plaque. Moderate 3 vessel coronary artery calcifications. Extensive hypertrophy of the left ventricle myocardium. Mitral valve and annular calcifications are present. Mediastinum/Nodes: No pericardial effusion. Thyroid is atrophic. No abnormal mediastinal adenopathy. Unremarkable esophagus. Lungs/Pleura: Severe emphysema dependent atelectasis at the lung bases. No pneumothorax. No pleural effusion. Musculoskeletal: New T11 and L1 compression fractures compared with a lateral radiograph dated 02/09/2015. At T11, the superior endplate compression results in 10% loss of height anteriorly. At L1, there is severe height loss and some retropulsion of the superior endplate. Review of the MIP images confirms  the above findings. IMPRESSION: No acute pulmonary thromboembolism. 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 Left ventricular hypertrophy. T11 and L1 compression fractures. Aortic Atherosclerosis (ICD10-I70.0) and Emphysema (ICD10-J43.9). Electronically Signed   By: Marybelle Killings M.D.   On: 10/18/2017 12:10   Mr Lumbar Spine Wo Contrast  Result Date: 10/19/2017 CLINICAL DATA:  Low back pain following injury. EXAM: MRI LUMBAR SPINE WITHOUT CONTRAST TECHNIQUE: Multiplanar, multisequence MR imaging of the lumbar spine was performed. No intravenous contrast was administered. COMPARISON:  CT abdomen pelvis 10/18/2017. FINDINGS: Segmentation: Conventional anatomy assumed, with the last open disc space designated L5-S1. Alignment: Similar to recent CT with a mild convex left scoliosis. There is a degenerative grade 1 anterolisthesis at L4-5. Vertebrae: There is an old healed mild superior endplate compression deformity at T11. A more severe biconcave compression fracture at L1 is associated with 3 mm of osseous retropulsion and mild residual marrow edema. The posterior elements are intact. No evidence of acute fracture or pars defect. The visualized sacroiliac joints appear unremarkable. Conus medullaris: Extends to the L1 level and appears normal. Fatty filum noted. Paraspinal and other soft tissues: Small left renal cysts are stable. Disc levels: Sagittal images demonstrate mild disc degeneration from T10-11 through T12-L1. No resulting spinal stenosis or nerve root encroachment. L1-2: Disc height and hydration are largely maintained. There is mild disc bulging. No spinal stenosis or nerve root encroachment. L2-3: Mild disc bulging eccentric to the left with mild facet and ligamentous hypertrophy. Mild spinal stenosis. No nerve root encroachment. L3-4: Mild disc bulging, facet and ligamentous hypertrophy. Mild spinal stenosis. No nerve root encroachment. L4-5: There is loss of disc  height with annular disc bulging eccentric to the right. There is advanced facet hypertrophy accounting for the grade 1 anterolisthesis. These factors contribute to moderate spinal stenosis with asymmetric narrowing of the right lateral recess and right foramen. There is possible encroachment on the right L4 and L5 nerve roots. L5-S1: Disc height and hydration are maintained. Mild facet hypertrophy, worse on the left. No significant spinal stenosis or nerve root encroachment. IMPRESSION: 1. Largely healed L1 biconcave compression fracture with osseous retropulsion and mild residual marrow edema. Old healed T11 superior endplate compression deformity. No acute osseous findings. 2. Moderate multifactorial spinal stenosis at L4-5, secondary to annular disc bulging, facet hypertrophy and resulting grade 1 anterolisthesis. There is asymmetric narrowing of the right lateral recess and right foramen with possible encroachment on the right L4 and L5 nerve roots. 3. Mild multifactorial spinal stenosis at L2-3 and L3-4 without nerve root encroachment. Electronically Signed   By: Richardean Sale M.D.   On: 10/19/2017 12:48   Dg C-arm 1-60 Min  Result Date: 10/23/2017 CLINICAL DATA:  Kyphoplasty. EXAM: DG C-ARM 61-120 MIN; LUMBAR SPINE - 2-3 VIEW COMPARISON:  10/19/2017. FINDINGS: Kyphoplasty upper lumbar vertebral body. Methylmethacrylate noted within the body and adjacent disc space. One image obtained. 1 minutes 55 seconds fluoroscopy time. IMPRESSION: Kyphoplasty upper lumbar vertebral body. Electronically Signed   By: Marcello Moores  Register   On: 10/23/2017 15:45   Ct Renal Stone Study  Result Date: 10/18/2017 CLINICAL DATA:  Left flank pain for the past 5 days. EXAM: CT ABDOMEN AND PELVIS WITHOUT CONTRAST TECHNIQUE: Multidetector CT imaging of the abdomen and pelvis was performed following the standard protocol without IV contrast. COMPARISON:  Abdomen ultrasound dated 12/08/2013. FINDINGS: Lower chest: Prominent  pulmonary vasculature at the lung bases. Minimal pericardial fluid with a maximum thickness of 6 mm. No pleural fluid. Coronary artery calcifications. Dense mitral valve annulus calcifications. Hepatobiliary: No focal liver abnormality is seen. No gallstones, gallbladder wall thickening, or biliary dilatation. Pancreas: Diffuse pancreatic atrophy. Spleen: Normal in size without focal abnormality. Adrenals/Urinary Tract: The previously demonstrated 2.0 cm septated cyst in the midpole of the left kidney is grossly stable, measuring 1.9 x 0.9 cm on image number 24 series 2. There is also a 1.1 cm exophytic probable cyst measuring up to 14 Hounsfield units in density arising from the upper pole of the left kidney. There is also a 9 mm exophytic probable cyst measuring up to 15 Hounsfield units in density arising from the lower pole of the right kidney. Normal appearing ureters, urinary bladder and adrenal glands. No urinary tract calculi or hydronephrosis. Stomach/Bowel: Large number of sigmoid and descending colon diverticula. Normal appearing appendix, stomach and small bowel with the exception of an elongated fat density mass in a loop of jejunum in the left mid abdomen, measuring 2.7 x 1.0 cm in maximum dimensions on image number 64 series 5. Vascular/Lymphatic: Atheromatous arterial calcifications without aneurysm. No enlarged lymph nodes. Reproductive: Status post hysterectomy. No adnexal masses. Other: No abdominal wall hernia or abnormality. No abdominopelvic ascites. Musculoskeletal: Approximately 70% L1 vertebral compression deformity with mild bony retropulsion and no acute fracture lines visualized. There is also an approximately 25% T11 vertebral body superior endplate compression deformity with mild Schmorl's node formation and no acute fracture lines. Facet degenerative changes in the mid and lower lumbar spine with associated grade 1 anterolisthesis at the L4-5 level. No pars defects. Mild to moderate  anterior spur formation at multiple levels of the lumbar and lower thoracic spine. IMPRESSION: 1. No urinary tract calculi or hydronephrosis. 2. Small probable mildly complicated cysts arising from the upper pole of the left kidney and lower pole of the right kidney, not seen at previous ultrasound. A repeat right renal ultrasound is recommended to help determine if these are cystic or solid. 3. Pulmonary vascular congestion. 4. Minimal pericardial effusion. 5. Atheromatous arterial calcifications, including the coronary arteries. 6. Extensive sigmoid and descending colon diverticulosis. 7. Old T11 and L1 vertebral compression fractures. 8. 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 Electronically Signed   By: Claudie Revering M.D.   On: 10/18/2017 11:09    Micro Results    No results found for this or any previous visit (from the past 240 hour(s)).     Today   Subjective:   [name] [name] today has no further back pain and stable for discharge  Objective:   Blood pressure (!) 177/77, pulse 79, temperature 98.2 F (36.8 C), temperature source Oral, resp. rate 18, height 5\' 3"  (1.6 m), weight 52.8 kg (116 lb 8 oz), SpO2 97 %.  No intake or  output data in the 24 hours ending 11/02/17 0831  Exam Awake Alert, Oriented x 3, No new F.N deficits, Normal affect [place].AT,PERRAL Supple Neck,No JVD, No cervical lymphadenopathy appriciated.  Symmetrical Chest wall movement, Good air movement bilaterally, CTAB RRR,No Gallops,Rubs or new Murmurs, No Parasternal Heave +ve B.Sounds, Abd Soft, Non tender, No organomegaly appriciated, No rebound -guarding or rigidity. No Cyanosis, Clubbing or edema, No new Rash or bruise  Data Review   CBC w Diff:  Lab Results  Component Value Date   WBC 5.1 10/22/2017   HGB 13.0 10/22/2017   HGB 13.2 09/26/2017   HCT 39.6 10/22/2017   HCT 40.0 09/26/2017   PLT 216 10/22/2017   PLT 270 09/26/2017   LYMPHOPCT 11  10/18/2017   MONOPCT 6 10/18/2017   EOSPCT 1 10/18/2017   BASOPCT 1 10/18/2017    CMP:  Lab Results  Component Value Date   NA 135 10/25/2017   NA 135 09/26/2017   NA 131 (L) 11/04/2013   K 4.0 10/25/2017   K 4.0 11/04/2013   CL 97 (L) 10/25/2017   CL 97 (L) 11/04/2013   CO2 27 10/25/2017   CO2 31 11/04/2013   BUN 13 10/25/2017   BUN 9 09/26/2017   BUN 12 11/04/2013   CREATININE 0.74 10/25/2017   CREATININE 0.92 05/20/2017   GLU 98 08/05/2014   PROT 8.9 (H) 10/18/2017   PROT 7.1 09/26/2017   ALBUMIN 4.3 10/18/2017   ALBUMIN 4.1 09/26/2017   BILITOT 1.1 10/18/2017   BILITOT 0.4 09/26/2017   ALKPHOS 99 10/18/2017   AST 27 10/18/2017   ALT 11 (L) 10/18/2017  .   Total Time in preparing paper work, data evaluation and todays exam - 35 minutes  Epifanio Lesches M.D on 3/30//2019 at 8:31 AM    Note: This dictation was prepared with Dragon dictation along with smaller phrase technology. Any transcriptional errors that result from this process are unintentional.

## 2017-11-03 ENCOUNTER — Other Ambulatory Visit: Payer: Self-pay | Admitting: Physician Assistant

## 2017-11-03 DIAGNOSIS — S32010G Wedge compression fracture of first lumbar vertebra, subsequent encounter for fracture with delayed healing: Secondary | ICD-10-CM

## 2017-11-03 MED ORDER — HYDROCODONE-ACETAMINOPHEN 5-325 MG PO TABS: 1.0000 | ORAL_TABLET | ORAL | 0 refills | Status: DC | PRN

## 2017-11-03 NOTE — Telephone Encounter (Signed)
Needs refill on Hydrocodone Acet.  5-325 mg.  to [facility] in [place]

## 2017-11-03 NOTE — Telephone Encounter (Signed)
Yes this is ok 

## 2017-11-03 NOTE — Progress Notes (Signed)
Hydrocodone-apap sent in

## 2017-11-03 NOTE — Telephone Encounter (Signed)
Sherlynn Stalls advised as below.

## 2017-11-04 ENCOUNTER — Encounter: Payer: Self-pay | Admitting: *Deleted

## 2017-11-04 ENCOUNTER — Ambulatory Visit
Admission: RE | Admit: 2017-11-04 | Discharge: 2017-11-04 | Disposition: A | Discharge status: Home / Self Care | Payer: Medicare Other | Source: Ambulatory Visit | Attending: Physician Assistant | Admitting: Physician Assistant

## 2017-11-04 DIAGNOSIS — N281 Cyst of kidney, acquired: Secondary | ICD-10-CM | POA: Diagnosis not present

## 2017-11-05 ENCOUNTER — Telehealth: Payer: Self-pay

## 2017-11-05 ENCOUNTER — Telehealth: Payer: Self-pay | Admitting: Physician Assistant

## 2017-11-05 DIAGNOSIS — N281 Cyst of kidney, acquired: Secondary | ICD-10-CM

## 2017-11-05 NOTE — Telephone Encounter (Signed)
Kathaleen Bury Physical Therapist Assistant with [facility] stated that he wasn't able to treat pt for physical therapy today do to pt's BP was elevated and do to [facility] policy he couldn't have pt do physical therapy today. Konrad Dolores stated that when he arrived at pt's home she was putting her dogs up. He first took pt's BP while sitting and it was 192/88. Konrad Dolores stated that he had pt continue to sit and they talked for about 10 minutes and he re-checked BP in the same arm and it was 188/86. Konrad Dolores stated that he plans to return to pt's home Friday 11/07/17 to try physical therapy again. Please advise. Thanks TNP

## 2017-11-05 NOTE — Telephone Encounter (Signed)
Left message to call back  

## 2017-11-05 NOTE — Telephone Encounter (Signed)
If elevated again on Friday patient will need to be seen for HTN

## 2017-11-05 NOTE — Telephone Encounter (Signed)
-----   Message from Mar Daring, [place] sent at 11/05/2017  1:41 PM EDT ----- US of the kidneys show multiple, small, benign appearing cyst. Kidney function has been normal. I have spoken with Dr. Rosanna Randy as well and he feels a referral to Urology would be beneficial just to make sure we do not need to do anything more or even follow. If agreeable I will place referral.

## 2017-11-06 NOTE — Telephone Encounter (Signed)
Left detailed message for Mr. [name] [name] with recommendations for patient.

## 2017-11-07 NOTE — Telephone Encounter (Signed)
Referral placed.

## 2017-11-07 NOTE — Telephone Encounter (Signed)
Patient advised as below. Patient verbalizes understanding and is in agreement with treatment plan.  

## 2017-11-10 ENCOUNTER — Other Ambulatory Visit: Payer: Self-pay | Admitting: Physician Assistant

## 2017-11-10 DIAGNOSIS — S32010G Wedge compression fracture of first lumbar vertebra, subsequent encounter for fracture with delayed healing: Secondary | ICD-10-CM

## 2017-11-10 MED ORDER — HYDROCODONE-ACETAMINOPHEN 5-325 MG PO TABS: 1.0000 | ORAL_TABLET | Freq: Four times a day (QID) | ORAL | 0 refills | Status: DC | PRN

## 2017-11-10 NOTE — Telephone Encounter (Signed)
Pt needs refill on hr hydrocodone/  She uses [facility] in [place].  Her call back is [phone]  Thanks teri

## 2017-11-10 NOTE — Telephone Encounter (Signed)
Please review. Thanks!  

## 2017-11-13 ENCOUNTER — Telehealth: Payer: Self-pay | Admitting: Physician Assistant

## 2017-11-13 NOTE — Telephone Encounter (Signed)
Please advise 

## 2017-11-13 NOTE — Telephone Encounter (Signed)
Yes this is ok to start her protonix again. For the TENS unit she will have to come pick up a hand written Rx for this and take to a medical supply store, unless we can give a verbal and the [facility] agency facilitate getting this for her.

## 2017-11-13 NOTE — Telephone Encounter (Signed)
Amy, a nurse from [facility],  is calling because patient has expressed to her that she would like to try a tinge unit for pain instead of pain medication.  She also states that the patient was on Protonix before she went into the hospital and they took her off of it and did not put her on anything else instead and she is having some reflux symptoms such as excess, bloating and indigestion and would like to know if it is okay to start taking her Protonix again.

## 2017-11-14 NOTE — Telephone Encounter (Signed)
Advised patient as below. Patient reports that she will get the Rx for the TENS unit at her appt on 4/24.

## 2017-11-19 ENCOUNTER — Encounter: Payer: Self-pay | Admitting: Physician Assistant

## 2017-11-19 ENCOUNTER — Other Ambulatory Visit: Payer: Self-pay

## 2017-11-19 ENCOUNTER — Ambulatory Visit (INDEPENDENT_AMBULATORY_CARE_PROVIDER_SITE_OTHER): Payer: Medicare Other | Admitting: Physician Assistant

## 2017-11-19 VITALS — BP 112/70 | HR 68 | Wt 112.0 lb

## 2017-11-19 DIAGNOSIS — K5903 Drug induced constipation: Secondary | ICD-10-CM

## 2017-11-19 DIAGNOSIS — S22080D Wedge compression fracture of T11-T12 vertebra, subsequent encounter for fracture with routine healing: Secondary | ICD-10-CM

## 2017-11-19 DIAGNOSIS — S32010D Wedge compression fracture of first lumbar vertebra, subsequent encounter for fracture with routine healing: Secondary | ICD-10-CM

## 2017-11-19 DIAGNOSIS — J418 Mixed simple and mucopurulent chronic bronchitis: Secondary | ICD-10-CM

## 2017-11-19 DIAGNOSIS — L299 Pruritus, unspecified: Secondary | ICD-10-CM | POA: Diagnosis not present

## 2017-11-19 MED ORDER — HYDROXYZINE HCL 25 MG PO TABS: 25.0000 mg | ORAL_TABLET | Freq: Every day | ORAL | 0 refills | Status: DC

## 2017-11-19 NOTE — Progress Notes (Signed)
Patient: [name] [name] Female    DOB: 1946/07/25   72 y.o.   MRN: [id number] Visit Date: 11/19/2017  Today's Provider: Mar Daring, PA-C   Chief Complaint  Patient presents with  . COPD  . Constipation  . Pain   Subjective:    Constipation  This is a recurrent problem. The current episode started more than 1 month ago. The problem is unchanged. The stool is described as firm, pellet like and loose. The patient is not on a high fiber diet. She does not exercise regularly. There has not been adequate water intake. Associated symptoms include back pain, bloating, diarrhea and flatus. She has tried laxatives, diet changes and stool softeners for the symptoms.  Back Pain  This is a recurrent problem. The current episode started more than 1 month ago. The quality of the pain is described as burning and shooting. The pain is at a severity of 4/10. The pain is mild. The pain is the same all the time. The symptoms are aggravated by bending and sitting. She has tried bed rest and analgesics for the symptoms. The treatment provided mild relief.  Breathing Problem  She complains of difficulty breathing. This is a recurrent problem. The current episode started more than 1 month ago. The problem has been gradually improving. Her symptoms are aggravated by any activity, exercise and minimal activity. Her symptoms are alleviated by steroid inhaler.      No Known Allergies   Current Outpatient Medications:  .  albuterol (PROVENTIL HFA;VENTOLIN HFA) 108 (90 Base) MCG/ACT inhaler, Inhale 2 puffs into the lungs every 4 (four) hours as needed for wheezing or shortness of breath., Disp: 1 Inhaler, Rfl: 1 .  ALPRAZolam (XANAX) 0.5 MG tablet, Take 1 tablet (0.5 mg total) by mouth 2 (two) times daily as needed. for anxiety, Disp: 60 tablet, Rfl: 5 .  aspirin 81 MG tablet, Take 81 mg by mouth daily., Disp: , Rfl:  .  citalopram (CELEXA) 20 MG tablet, Take 1 tablet (20 mg total) by mouth daily.,  Disp: 30 tablet, Rfl: 0 .  Fluticasone-Salmeterol (ADVAIR) 250-50 MCG/DOSE AEPB, Inhale 1 puff into the lungs 2 (two) times daily., Disp: 14 each, Rfl: 1 .  furosemide (LASIX) 20 MG tablet, Take 20 mg by mouth daily. , Disp: , Rfl:  .  HYDROcodone-acetaminophen (NORCO/VICODIN) 5-325 MG tablet, Take 1 tablet by mouth every 6 (six) hours as needed for moderate pain., Disp: 90 tablet, Rfl: 0 .  lisinopril (PRINIVIL,ZESTRIL) 40 MG tablet, Take 40 mg by mouth daily. , Disp: , Rfl:  .  metoprolol succinate (TOPROL-XL) 50 MG 24 hr tablet, Take 50 mg by mouth every evening. Take with or immediately following a meal., Disp: , Rfl:  .  mometasone-formoterol (DULERA) 200-5 MCG/ACT AERO, Inhale 2 puffs into the lungs 2 (two) times daily., Disp: 13 g, Rfl: 0 .  potassium chloride SA (K-DUR,KLOR-CON) 20 MEQ tablet, Take 1 tablet (20 mEq total) by mouth daily., Disp: 90 tablet, Rfl: 1 .  simvastatin (ZOCOR) 10 MG tablet, Take 1 tablet (10 mg total) by mouth daily., Disp: 90 tablet, Rfl: 3 .  Tiotropium Bromide Monohydrate (SPIRIVA RESPIMAT) 1.25 MCG/ACT AERS, Inhale 1 puff into the lungs daily., Disp: 4 g, Rfl: 5  Review of Systems  Constitutional: Positive for fatigue.  Eyes: Negative.   Gastrointestinal: Positive for bloating, constipation, diarrhea and flatus.  Musculoskeletal: Positive for back pain.  Allergic/Immunologic: Negative.     Social History   Tobacco  Use  . Smoking status: Former Smoker    Packs/day: 0.50    Years: 50.00    Pack years: 25.00    Last attempt to quit: 10/18/2017    Years since quitting: 0.0  . Smokeless tobacco: Never Used  Substance Use Topics  . Alcohol use: Yes    Alcohol/week: 4.2 oz    Types: 7 Glasses of wine per week    Comment: 1-2 glasses wine daily   Objective:   BP 112/70 (BP Location: Left Arm, Patient Position: Sitting, Cuff Size: Normal)   Pulse 68   Wt 112 lb (50.8 kg)   SpO2 96%   BMI 19.84 kg/m  Vitals:   11/19/17 1325  BP: 112/70  Pulse: 68   SpO2: 96%  Weight: 112 lb (50.8 kg)     Physical Exam  Constitutional: She appears well-developed and well-nourished. No distress.  Neck: Normal range of motion. Neck supple. No JVD present. No tracheal deviation present. No thyromegaly present.  Cardiovascular: Normal rate, regular rhythm and normal heart sounds. Exam reveals no gallop and no friction rub.  No murmur heard. Pulmonary/Chest: Effort normal. No respiratory distress. She has wheezes. She has no rales.  Lymphadenopathy:    She has no cervical adenopathy.  Skin: She is not diaphoretic.  Vitals reviewed.       Assessment & Plan:     1. Itch Patient had rash develop from hospitalization and I feel she is now in an itch-scratch cycle as rash appears to have improved but scratch marks with scabs remain in the area. Will give hydroxyzine as below. Call if no improvements.  - hydrOXYzine (ATARAX/VISTARIL) 25 MG tablet; Take 1 tablet (25 mg total) by mouth at bedtime.  Dispense: 15 tablet; Refill: 0  2. Mixed simple and mucopurulent chronic bronchitis (HCC) Improved. Is going to use Dulera inhaler given to her at the hospital. After completion she will switch back to Advair BID. Continue Spiriva daily. Albuterol prn. I will see her back in 3 months.  3. Closed compression fracture of thoracolumbar vertebra with routine healing, subsequent encounter Still with pain. Getting a TENS unit to see if this will help her transition off pain medications. Will recheck in 3 months. Call if worsens before then.  4. Drug-induced constipation Secondary to narcotic pain medication for back. Advised to take a stool softener every time she takes a pain pill. Add colace once or twice daily until soft BM daily achieved. Call if symptoms worsen.        Mar Daring, PA-C  [facility]

## 2017-11-19 NOTE — Patient Instructions (Signed)
Take a stool softener with every pain pill. Start Colace once daily. Increase to twice daily if needed. Push fluids.  Hydroxyzine capsules or tablets What is this medicine? HYDROXYZINE (hye [place] i zeen) is an antihistamine. This medicine is used to treat allergy symptoms. It is also used to treat anxiety and tension. This medicine can be used with other medicines to induce sleep before surgery. This medicine may be used for other purposes; ask your health care provider or pharmacist if you have questions. COMMON BRAND NAME(S): ANX, Atarax, Rezine, Vistaril What should I tell my health care provider before I take this medicine? They need to know if you have any of these conditions: -any chronic illness -difficulty passing urine -glaucoma -heart disease -kidney disease -liver disease -lung disease -an unusual or allergic reaction to hydroxyzine, cetirizine, other medicines, foods, dyes, or preservatives -pregnant or trying to get pregnant -breast-feeding How should I use this medicine? Take this medicine by mouth with a full glass of water. Follow the directions on the prescription label. You may take this medicine with food or on an empty stomach. Take your medicine at regular intervals. Do not take your medicine more often than directed. Talk to your pediatrician regarding the use of this medicine in children. Special care may be needed. While this drug may be prescribed for children as young as 70 years of age for selected conditions, precautions do apply. Patients over 28 years old may have a stronger reaction and need a smaller dose. Overdosage: If you think you have taken too much of this medicine contact a poison control center or emergency room at once. NOTE: This medicine is only for you. Do not share this medicine with others. What if I miss a dose? If you miss a dose, take it as soon as you can. If it is almost time for your next dose, take only that dose. Do not take double or  extra doses. What may interact with this medicine? -alcohol -barbiturate medicines for sleep or seizures -medicines for colds, allergies -medicines for depression, anxiety, or emotional disturbances -medicines for pain -medicines for sleep -muscle relaxants This list may not describe all possible interactions. Give your health care provider a list of all the medicines, herbs, non-prescription drugs, or dietary supplements you use. Also tell them if you smoke, drink alcohol, or use illegal drugs. Some items may interact with your medicine. What should I watch for while using this medicine? Tell your doctor or health care professional if your symptoms do not improve. You may get drowsy or dizzy. Do not drive, use machinery, or do anything that needs mental alertness until you know how this medicine affects you. Do not stand or sit up quickly, especially if you are an older patient. This reduces the risk of dizzy or fainting spells. Alcohol may interfere with the effect of this medicine. Avoid alcoholic drinks. Your mouth may get dry. Chewing sugarless gum or sucking hard candy, and drinking plenty of water may help. Contact your doctor if the problem does not go away or is severe. This medicine may cause dry eyes and blurred vision. If you wear contact lenses you may feel some discomfort. Lubricating drops may help. See your eye doctor if the problem does not go away or is severe. If you are receiving skin tests for allergies, tell your doctor you are using this medicine. What side effects may I notice from receiving this medicine? Side effects that you should report to your doctor or health care  professional as soon as possible: -fast or irregular heartbeat -difficulty passing urine -seizures -slurred speech or confusion -tremor Side effects that usually do not require medical attention (report to your doctor or health care professional if they continue or are  bothersome): -constipation -drowsiness -fatigue -headache -stomach upset This list may not describe all possible side effects. Call your doctor for medical advice about side effects. You may report side effects to FDA at 1-800-FDA-1088. Where should I keep my medicine? Keep out of the reach of children. Store at room temperature between 15 and 30 degrees C (59 and 86 degrees F). Keep container tightly closed. Throw away any unused medicine after the expiration date. NOTE: This sheet is a summary. It may not cover all possible information. If you have questions about this medicine, talk to your doctor, pharmacist, or health care provider.  2018 Elsevier/Gold Standard (2007-11-27 14:50:59)

## 2017-11-21 ENCOUNTER — Other Ambulatory Visit: Payer: Self-pay | Admitting: Physician Assistant

## 2017-11-21 DIAGNOSIS — I10 Essential (primary) hypertension: Secondary | ICD-10-CM

## 2017-11-26 DIAGNOSIS — J449 Chronic obstructive pulmonary disease, unspecified: Secondary | ICD-10-CM | POA: Diagnosis not present

## 2017-12-10 ENCOUNTER — Ambulatory Visit: Payer: Medicare Other | Admitting: Urology

## 2017-12-10 ENCOUNTER — Encounter: Payer: Self-pay | Admitting: Urology

## 2017-12-10 VITALS — BP 145/77 | HR 78 | Resp 16 | Ht 63.0 in | Wt 108.9 lb

## 2017-12-10 DIAGNOSIS — N281 Cyst of kidney, acquired: Secondary | ICD-10-CM

## 2017-12-10 LAB — URINALYSIS, COMPLETE
Bilirubin, UA: NEGATIVE
Glucose, UA: NEGATIVE
Ketones, UA: NEGATIVE
LEUKOCYTES UA: NEGATIVE
Nitrite, UA: NEGATIVE
PH UA: 6 (ref 5.0–7.5)
SPEC GRAV UA: 1.02 (ref 1.005–1.030)
Urobilinogen, Ur: 0.2 mg/dL (ref 0.2–1.0)

## 2017-12-10 LAB — MICROSCOPIC EXAMINATION

## 2017-12-10 NOTE — Progress Notes (Signed)
12/10/2017 4:00 PM   [name] [name] Apr 22, 1946 [id number]  Referring provider: Mar Daring, PA-C [place], [place] [address]  Chief Complaint  Patient presents with  . Flank Pain    HPI: [name] [name] is a 72 year old female who was admitted to the hospital in March 2019 for an abnormal EKG, elevated troponin, left flank pain and a vertebral compression fracture.  A noncontrast CT of the abdomen pelvis was performed which showed bilateral renal cysts with questionable mildly complicated cysts in the lower pole of the right kidney and upper pole of the left kidney.  A follow-up renal ultrasound was recommended which was performed on 11/04/2017.  The renal ultrasound showed benign/simple appearing cyst bilaterally and no evidence of complex cyst or solid masses.  Her flank pain has resolved.  She is having some pain due to her vertebral compression fracture.  She denies previous history of urologic problems.   PMH: Past Medical History:  Diagnosis Date  . Anxiety   . CAD (coronary artery disease) unk  . COPD (chronic obstructive pulmonary disease) ([place])   . Depression   . Hypercholesteremia unk  . Hypertension     Surgical History: Past Surgical History:  Procedure Laterality Date  . ABDOMINAL HYSTERECTOMY  1982  . KYPHOPLASTY N/A 10/23/2017   Procedure: [id number];  Surgeon: Hessie Knows, MD;  Location: [facility] ORS;  Service: Orthopedics;  Laterality: N/A;    Home Medications:  Allergies as of 12/10/2017   No Known Allergies     Medication List        Accurate as of 12/10/17  4:00 PM. Always use your most recent med list.          albuterol 108 (90 Base) MCG/ACT inhaler Commonly known as:  PROVENTIL HFA;VENTOLIN HFA Inhale 2 puffs into the lungs every 4 (four) hours as needed for wheezing or shortness of breath.   ALPRAZolam 0.5 MG tablet Commonly known as:  XANAX Take 1 tablet (0.5 mg total) by mouth 2 (two) times daily as needed.  for anxiety   aspirin 81 MG tablet Take 81 mg by mouth daily.   citalopram 20 MG tablet Commonly known as:  CELEXA Take 1 tablet (20 mg total) by mouth daily.   Fluticasone-Salmeterol 250-50 MCG/DOSE Aepb Commonly known as:  ADVAIR Inhale 1 puff into the lungs 2 (two) times daily.   furosemide 20 MG tablet Commonly known as:  LASIX Take 20 mg by mouth daily.   lisinopril 40 MG tablet Commonly known as:  PRINIVIL,ZESTRIL Take 40 mg by mouth daily.   metoprolol succinate 50 MG 24 hr tablet Commonly known as:  TOPROL-XL Take 1 tablet (50 mg total) by mouth daily. QHS   pantoprazole 20 MG tablet Commonly known as:  PROTONIX Take 20 mg by mouth daily.   potassium chloride SA 20 MEQ tablet Commonly known as:  K-DUR,KLOR-CON Take 1 tablet (20 mEq total) by mouth daily.   simvastatin 10 MG tablet Commonly known as:  ZOCOR Take 1 tablet (10 mg total) by mouth daily.   Tiotropium Bromide Monohydrate 1.25 MCG/ACT Aers Commonly known as:  SPIRIVA RESPIMAT Inhale 1 puff into the lungs daily.       Allergies: No Known Allergies  Family History: Family History  Problem Relation Age of Onset  . Leukemia Mother   . Aneurysm Father   . Diabetes Father   . Heart disease Father   . Diabetes Maternal Grandmother   . Diabetes Paternal Grandmother  Social History:  reports that she quit smoking about 7 weeks ago. She has a 25.00 pack-year smoking history. She has never used smokeless tobacco. She reports that she drinks about 4.2 oz of alcohol per week. She reports that she does not use drugs.  ROS: UROLOGY Frequent Urination?: No Hard to postpone urination?: Yes Burning/pain with urination?: Yes Get up at night to urinate?: Yes Leakage of urine?: Yes Urine stream starts and stops?: Yes Trouble starting stream?: No Do you have to strain to urinate?: Yes Blood in urine?: No Urinary tract infection?: No Sexually transmitted disease?: No Injury to kidneys or bladder?:  No Painful intercourse?: No Weak stream?: Yes Currently pregnant?: No Vaginal bleeding?: No Last menstrual period?: n  Gastrointestinal Nausea?: Yes Vomiting?: Yes Indigestion/heartburn?: Yes Diarrhea?: No Constipation?: Yes  Constitutional Fever: No Night sweats?: No Weight loss?: Yes Fatigue?: Yes  Skin Skin rash/lesions?: Yes Itching?: Yes  Eyes Blurred vision?: Yes Double vision?: No  Ears/Nose/Throat Sore throat?: No Sinus problems?: Yes  Hematologic/Lymphatic Swollen glands?: No Easy bruising?: Yes  Cardiovascular Leg swelling?: Yes Chest pain?: No  Respiratory Cough?: Yes Shortness of breath?: Yes  Endocrine Excessive thirst?: Yes  Musculoskeletal Back pain?: Yes Joint pain?: Yes  Neurological Headaches?: Yes Dizziness?: Yes  Psychologic Depression?: Yes Anxiety?: Yes  Physical Exam: BP (!) 145/77   Pulse 78   Resp 16   Ht 5\' 3"  (1.6 m)   Wt 108 lb 14.4 oz (49.4 kg)   SpO2 97%   BMI 19.29 kg/m    Constitutional:  Alert and oriented, No acute distress. HEENT: [place] AT, moist mucus membranes.  Trachea midline, no masses. Cardiovascular: No clubbing, cyanosis, or edema. Respiratory: Normal respiratory effort, no increased work of breathing. GI: Abdomen is soft, nontender, nondistended, no abdominal masses GU: No CVA tenderness Lymph: No cervical or inguinal lymphadenopathy. Skin: No rashes, bruises or suspicious lesions. Neurologic: Grossly intact, no focal deficits, moving all 4 extremities. Psychiatric: Normal mood and affect.  Laboratory Data: Lab Results  Component Value Date   WBC 5.1 10/22/2017   HGB 13.0 10/22/2017   HCT 39.6 10/22/2017   MCV 97.1 10/22/2017   PLT 216 10/22/2017    Lab Results  Component Value Date   CREATININE 0.74 10/25/2017    Lab Results  Component Value Date   HGBA1C 5.5 09/26/2017    Urinalysis    Component Value Date/Time   COLORURINE YELLOW (A) 10/18/2017 1038   APPEARANCEUR HAZY  (A) 10/18/2017 1038   LABSPEC 1.044 (H) 10/18/2017 1038   PHURINE 5.0 10/18/2017 1038   GLUCOSEU NEGATIVE 10/18/2017 1038   HGBUR NEGATIVE 10/18/2017 1038   BILIRUBINUR NEGATIVE 10/18/2017 1038   KETONESUR NEGATIVE 10/18/2017 1038   PROTEINUR NEGATIVE 10/18/2017 1038   NITRITE NEGATIVE 10/18/2017 1038   LEUKOCYTESUR NEGATIVE 10/18/2017 1038    Pertinent Imaging: images were reviewed Results for orders placed during the hospital encounter of 11/04/17  US Renal   Narrative CLINICAL DATA:  Renal cyst noted on CT  EXAM: RENAL / URINARY TRACT ULTRASOUND COMPLETE  COMPARISON:  CT 10/18/2017  FINDINGS: Right Kidney:  Length: 10.0 cm. Multiple simple appearing cysts, the largest 11 mm in the midpole. No hydronephrosis or suspicious mass. Normal echotexture.  Left Kidney:  Length: 9.0 cm. Multiple cysts, the largest 1.9 cm in the mid to upper pole. These appear simple/benign. No hydronephrosis. Normal echotexture.  Bladder:  Appears normal for degree of bladder distention.  IMPRESSION: Multiple bilateral benign-appearing cysts.  No hydronephrosis.   Electronically Signed  By: Rolm Baptise M.D.   On: 11/05/2017 08:07     Results for orders placed during the hospital encounter of 10/18/17  CT RENAL STONE STUDY   Narrative CLINICAL DATA:  Left flank pain for the past 5 days.  EXAM: CT ABDOMEN AND PELVIS WITHOUT CONTRAST  TECHNIQUE: Multidetector CT imaging of the abdomen and pelvis was performed following the standard protocol without IV contrast.  COMPARISON:  Abdomen ultrasound dated 12/08/2013.  FINDINGS: Lower chest: Prominent pulmonary vasculature at the lung bases. Minimal pericardial fluid with a maximum thickness of 6 mm. No pleural fluid. Coronary artery calcifications. Dense mitral valve annulus calcifications.  Hepatobiliary: No focal liver abnormality is seen. No gallstones, gallbladder wall thickening, or biliary dilatation.  Pancreas: Diffuse  pancreatic atrophy.  Spleen: Normal in size without focal abnormality.  Adrenals/Urinary Tract: The previously demonstrated 2.0 cm septated cyst in the midpole of the left kidney is grossly stable, measuring 1.9 x 0.9 cm on image number 24 series 2. There is also a 1.1 cm exophytic probable cyst measuring up to 14 Hounsfield units in density arising from the upper pole of the left kidney. There is also a 9 mm exophytic probable cyst measuring up to 15 Hounsfield units in density arising from the lower pole of the right kidney. Normal appearing ureters, urinary bladder and adrenal glands. No urinary tract calculi or hydronephrosis.  Stomach/Bowel: Large number of sigmoid and descending colon diverticula. Normal appearing appendix, stomach and small bowel with the exception of an elongated fat density mass in a loop of jejunum in the left mid abdomen, measuring 2.7 x 1.0 cm in maximum dimensions on image number 64 series 5.  Vascular/Lymphatic: Atheromatous arterial calcifications without aneurysm. No enlarged lymph nodes.  Reproductive: Status post hysterectomy. No adnexal masses.  Other: No abdominal wall hernia or abnormality. No abdominopelvic ascites.  Musculoskeletal: Approximately 70% L1 vertebral compression deformity with mild bony retropulsion and no acute fracture lines visualized. There is also an approximately 25% T11 vertebral body superior endplate compression deformity with mild Schmorl's node formation and no acute fracture lines. Facet degenerative changes in the mid and lower lumbar spine with associated grade 1 anterolisthesis at the L4-5 level. No pars defects. Mild to moderate anterior spur formation at multiple levels of the lumbar and lower thoracic spine.  IMPRESSION: 1. No urinary tract calculi or hydronephrosis. 2. Small probable mildly complicated cysts arising from the upper pole of the left kidney and lower pole of the right kidney, not seen at  previous ultrasound. A repeat right renal ultrasound is recommended to help determine if these are cystic or solid. 3. Pulmonary vascular congestion. 4. Minimal pericardial effusion. 5. Atheromatous arterial calcifications, including the coronary arteries. 6. Extensive sigmoid and descending colon diverticulosis. 7. Old T11 and L1 vertebral compression fractures. 8. 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   Electronically Signed   By: Claudie Revering M.D.   On: 10/18/2017 11:09     Assessment & Plan:   72 year old female with bilateral simple renal cysts.  Her kidney function is normal.  I recommended a follow-up visit in 1 year with repeat renal ultrasound and if stable no further monitoring will be needed.    Abbie Sons, [facility] [address] [place], [place] [address] [phone]

## 2017-12-11 ENCOUNTER — Telehealth: Payer: Self-pay | Admitting: Urology

## 2017-12-11 ENCOUNTER — Encounter: Payer: Self-pay | Admitting: Urology

## 2017-12-11 NOTE — Telephone Encounter (Signed)
App made Tried calling patient no answer Lm on VM and mailed her app  [name]

## 2017-12-11 NOTE — Telephone Encounter (Signed)
-----   Message from Abbie Sons, MD sent at 12/11/2017  8:51 AM EDT ----- Needs one-year follow-up with [facility]

## 2017-12-12 ENCOUNTER — Other Ambulatory Visit: Payer: Self-pay | Admitting: Physician Assistant

## 2017-12-12 ENCOUNTER — Telehealth: Payer: Self-pay | Admitting: Physician Assistant

## 2017-12-12 DIAGNOSIS — S32010D Wedge compression fracture of first lumbar vertebra, subsequent encounter for fracture with routine healing: Principal | ICD-10-CM

## 2017-12-12 DIAGNOSIS — M8000XD Age-related osteoporosis with current pathological fracture, unspecified site, subsequent encounter for fracture with routine healing: Secondary | ICD-10-CM

## 2017-12-12 DIAGNOSIS — S22080D Wedge compression fracture of T11-T12 vertebra, subsequent encounter for fracture with routine healing: Secondary | ICD-10-CM

## 2017-12-12 MED ORDER — HYDROCODONE-ACETAMINOPHEN 5-325 MG PO TABS: 1.0000 | ORAL_TABLET | Freq: Three times a day (TID) | ORAL | 0 refills | Status: DC | PRN

## 2017-12-12 NOTE — Telephone Encounter (Signed)
Ana with [facility] called requesting an order for a DEXA scan for the patient.  States the patient is in the window to have one completed for [employer].

## 2017-12-12 NOTE — Telephone Encounter (Signed)
Hydrocodone 5-3.25 send to [facility] in [place] states [name] [name] has given her this before and she has been out for a couple of days.

## 2017-12-12 NOTE — Telephone Encounter (Signed)
[facility] reviewed. Refilled 

## 2017-12-24 ENCOUNTER — Ambulatory Visit: Payer: Self-pay | Admitting: Physician Assistant

## 2017-12-27 DIAGNOSIS — J449 Chronic obstructive pulmonary disease, unspecified: Secondary | ICD-10-CM | POA: Diagnosis not present

## 2018-01-08 ENCOUNTER — Ambulatory Visit (INDEPENDENT_AMBULATORY_CARE_PROVIDER_SITE_OTHER): Payer: Medicare Other | Admitting: Physician Assistant

## 2018-01-08 ENCOUNTER — Encounter: Payer: Self-pay | Admitting: Physician Assistant

## 2018-01-08 VITALS — BP 110/60 | HR 60 | Temp 98.0°F | Resp 16 | Wt 109.2 lb

## 2018-01-08 DIAGNOSIS — F411 Generalized anxiety disorder: Secondary | ICD-10-CM | POA: Diagnosis not present

## 2018-01-08 DIAGNOSIS — J441 Chronic obstructive pulmonary disease with (acute) exacerbation: Secondary | ICD-10-CM | POA: Diagnosis not present

## 2018-01-08 DIAGNOSIS — S22080D Wedge compression fracture of T11-T12 vertebra, subsequent encounter for fracture with routine healing: Secondary | ICD-10-CM | POA: Diagnosis not present

## 2018-01-08 DIAGNOSIS — S32010D Wedge compression fracture of first lumbar vertebra, subsequent encounter for fracture with routine healing: Secondary | ICD-10-CM

## 2018-01-08 DIAGNOSIS — W19XXXA Unspecified fall, initial encounter: Secondary | ICD-10-CM | POA: Diagnosis not present

## 2018-01-08 DIAGNOSIS — M62838 Other muscle spasm: Secondary | ICD-10-CM | POA: Diagnosis not present

## 2018-01-08 MED ORDER — PREDNISONE 10 MG (21) PO TBPK: ORAL_TABLET | ORAL | 0 refills | Status: DC

## 2018-01-08 MED ORDER — CITALOPRAM HYDROBROMIDE 20 MG PO TABS: 20.0000 mg | ORAL_TABLET | Freq: Every day | ORAL | 1 refills | Status: DC

## 2018-01-08 MED ORDER — BACLOFEN 10 MG PO TABS: 10.0000 mg | ORAL_TABLET | Freq: Three times a day (TID) | ORAL | 0 refills | Status: DC

## 2018-01-08 MED ORDER — ALPRAZOLAM 0.5 MG PO TABS: 0.5000 mg | ORAL_TABLET | Freq: Two times a day (BID) | ORAL | 1 refills | Status: DC | PRN

## 2018-01-08 MED ORDER — MELOXICAM 7.5 MG PO TABS: 7.5000 mg | ORAL_TABLET | Freq: Every day | ORAL | 0 refills | Status: DC

## 2018-01-08 MED ORDER — FLUTICASONE-SALMETEROL 250-50 MCG/DOSE IN AEPB: 1.0000 | INHALATION_SPRAY | Freq: Two times a day (BID) | RESPIRATORY_TRACT | 1 refills | Status: DC

## 2018-01-08 NOTE — Patient Instructions (Addendum)
Start with steroid taper (6 day prednisone) first. Hold meloxicam and start meloxicam once steroid taper completed. It is ok to take Baclofen with Prednisone.  Do NOT take Ibuprofen or aleve (naproxen) with Meloxicam  Meloxicam capsules What is this medicine? MELOXICAM (mel OX i cam) is a non-steroidal anti-inflammatory drug (NSAID). It is used to reduce swelling and to treat pain. It is used for osteoarthritis. This medicine may be used for other purposes; ask your health care provider or pharmacist if you have questions. COMMON BRAND NAME(S): Vivlodex What should I tell my health care provider before I take this medicine? They need to know if you have any of these conditions: -bleeding disorders -cigarette smoker -coronary artery bypass graft (CABG) surgery within the past 2 weeks -drink more than 3 alcohol-containing drinks per day -heart disease -high blood pressure -history of stomach bleeding -kidney disease -liver disease -lung or breathing disease, like asthma -stomach or intestine problems -an unusual or allergic reaction to meloxicam, aspirin, other NSAIDs, other medicines, foods, dyes, or preservatives -pregnant or trying to get pregnant -breast-feeding How should I use this medicine? Take this medicine by mouth with a full glass of water. Follow the directions on the prescription label. You can take it with or without food. If it upsets your stomach, take it with food. Take your medicine at regular intervals. Do not take it more often than directed. Do not stop taking except on your doctor's advice. A special MedGuide will be given to you by the pharmacist with each prescription and refill. Be sure to read this information carefully each time. Talk to your pediatrician regarding the use of this medicine in children. Special care may be needed. Patients over 58 years old may have a stronger reaction and need a smaller dose. Overdosage: If you think you have taken too much of  this medicine contact a poison control center or emergency room at once. NOTE: This medicine is only for you. Do not share this medicine with others. What if I miss a dose? If you miss a dose, take it as soon as you can. If it is almost time for your next dose, take only that dose. Do not take double or extra doses. What may interact with this medicine? Do not take this medicine with any of the following medications: -cidofovir -ketorolac This medicine may also interact with the following medications: -aspirin and aspirin-like medicines -certain medicines for blood pressure, heart disease, irregular heart beat -certain medicines for depression, anxiety, or psychotic disturbances -certain medicines that treat or prevent blood clots like warfarin, enoxaparin, dalteparin, apixaban, dabigatran, rivaroxaban -cyclosporine -digoxin -diuretics -methotrexate -other NSAIDs, medicines for pain and inflammation, like ibuprofen and naproxen -pemetrexed This list may not describe all possible interactions. Give your health care provider a list of all the medicines, herbs, non-prescription drugs, or dietary supplements you use. Also tell them if you smoke, drink alcohol, or use illegal drugs. Some items may interact with your medicine. What should I watch for while using this medicine? Tell your doctor or healthcare professional if your symptoms do not start to get better or if they get worse. Do not take other medicines that contain aspirin, ibuprofen, or naproxen with this medicine. Side effects such as stomach upset, nausea, or ulcers may be more likely to occur. Many medicines available without a prescription should not be taken with this medicine. This medicine can cause ulcers and bleeding in the stomach and intestines at any time during treatment. This can happen with no  warning and may cause death. There is increased risk with taking this medicine for a long time. Smoking, drinking alcohol, older  age, and poor health can also increase risks. Call your doctor right away if you have stomach pain or blood in your vomit or stool. This medicine does not prevent heart attack or stroke. In fact, this medicine may increase the chance of a heart attack or stroke. The chance may increase with longer use of this medicine and in people who have heart disease. If you take aspirin to prevent heart attack or stroke, talk with your doctor or health care professional. What side effects may I notice from receiving this medicine? Side effects that you should report to your doctor or health care professional as soon as possible: -allergic reactions like skin rash, itching or hives, swelling of the face, lips, or tongue -nausea, vomiting -signs and symptoms of a blood clot such as breathing problems; changes in vision; chest pain; severe, sudden headache; pain, swelling, warmth in the leg; trouble speaking; sudden numbness or weakness of the face, arm, or leg -signs and symptoms of bleeding such as bloody or black, tarry stools; red or dark-brown urine; spitting up blood or brown material that looks like coffee grounds; red spots on the skin; unusual bruising or bleeding from the eye, gums, or nose -signs and symptoms of liver injury like dark yellow or brown urine; general ill feeling or flu-like symptoms; light-colored stools; loss of appetite; nausea; right upper belly pain; unusually weak or tired; yellowing of the eyes or skin -signs and symptoms of stroke like changes in vision; confusion; trouble speaking or understanding; severe headaches; sudden numbness or weakness of the face, arm, or leg; trouble walking; dizziness; loss of balance or coordination Side effects that usually do not require medical attention (report to your doctor or health care professional if they continue or are bothersome): -constipation -diarrhea -gas This list may not describe all possible side effects. Call your doctor for medical  advice about side effects. You may report side effects to FDA at 1-800-FDA-1088. Where should I keep my medicine? Keep out of the reach of children. Store at room temperature between 15 and 30 degrees C (59 and 86 degrees F). Throw away any unused medicine after the expiration date. NOTE: This sheet is a summary. It may not cover all possible information. If you have questions about this medicine, talk to your doctor, pharmacist, or health care provider.  2018 Elsevier/Gold Standard (2015-08-17 10:31:18)  Baclofen tablets What is this medicine? BACLOFEN (BAK loe fen) helps relieve spasms and cramping of muscles. It may be used to treat symptoms of multiple sclerosis or spinal cord injury. This medicine may be used for other purposes; ask your health care provider or pharmacist if you have questions. COMMON BRAND NAME(S): ED Baclofen, Lioresal What should I tell my health care provider before I take this medicine? They need to know if you have any of these conditions: -kidney disease -seizures -stroke -an unusual or allergic reaction to baclofen, other medicines, foods, dyes, or preservatives -pregnant or trying to get pregnant -breast-feeding How should I use this medicine? Take this medicine by mouth. Swallow it with a drink of water. Follow the directions on the prescription label. Do not take more medicine than you are told to take. Talk to your pediatrician regarding the use of this medicine in children. Special care may be needed. Overdosage: If you think you have taken too much of this medicine contact a poison control  center or emergency room at once. NOTE: This medicine is only for you. Do not share this medicine with others. What if I miss a dose? If you miss a dose, take it as soon as you can. If it is almost time for your next dose, take only that dose. Do not take double or extra doses. What may interact with this medicine? Do not take this medication with any of the  following medicines: -narcotic medicines for cough This medicine may also interact with the following medications: -alcohol -antihistamines for allergy, cough and cold -certain medicines for anxiety or sleep -certain medicines for depression like amitriptyline, fluoxetine, sertraline -certain medicines for seizures like phenobarbital, primidone -general anesthetics like halothane, isoflurane, methoxyflurane, propofol -local anesthetics like lidocaine, pramoxine, tetracaine -medicines that relax muscles for surgery -narcotic medicines for pain -phenothiazines like chlorpromazine, mesoridazine, prochlorperazine, thioridazine This list may not describe all possible interactions. Give your health care provider a list of all the medicines, herbs, non-prescription drugs, or dietary supplements you use. Also tell them if you smoke, drink alcohol, or use illegal drugs. Some items may interact with your medicine. What should I watch for while using this medicine? Tell your doctor or health care professional if your symptoms do not start to get better or if they get worse. Do not suddenly stop taking your medicine. If you do, you may develop a severe reaction. If your doctor wants you to stop the medicine, the dose will be slowly lowered over time to avoid any side effects. Follow the advice of your doctor. You may get drowsy or dizzy. Do not drive, use machinery, or do anything that needs mental alertness until you know how this medicine affects you. Do not stand or sit up quickly, especially if you are an older patient. This reduces the risk of dizzy or fainting spells. Alcohol may interfere with the effect of this medicine. Avoid alcoholic drinks. If you are taking another medicine that also causes drowsiness, you may have more side effects. Give your health care provider a list of all medicines you use. Your doctor will tell you how much medicine to take. Do not take more medicine than directed. Call  emergency for help if you have problems breathing or unusual sleepiness. What side effects may I notice from receiving this medicine? Side effects that you should report to your doctor or health care professional as soon as possible: -allergic reactions like skin rash, itching or hives, swelling of the face, lips, or tongue -breathing problems -changes in emotions or moods -changes in vision -chest pain -fast, irregular heartbeat -feeling faint or lightheaded, falls -hallucinations -loss of balance or coordination -ringing of the ears -seizures -trouble passing urine or change in the amount of urine -trouble walking -unusually weak or tired Side effects that usually do not require medical attention (report to your doctor or health care professional if they continue or are bothersome): -changes in taste -confusion -constipation -diarrhea -dry mouth -headache -muscle weakness -nausea, vomiting -trouble sleeping This list may not describe all possible side effects. Call your doctor for medical advice about side effects. You may report side effects to FDA at 1-800-FDA-1088. Where should I keep my medicine? Keep out of the reach of children. Store at room temperature between 15 and 30 degrees C (59 and 86 degrees F). Keep container tightly closed. Throw away any unused medicine after the expiration date. NOTE: This sheet is a summary. It may not cover all possible information. If you have questions about this medicine,  talk to your doctor, pharmacist, or health care provider.  2018 Elsevier/Gold Standard (2015-04-24 15:56:23)

## 2018-01-08 NOTE — Progress Notes (Addendum)
Patient: [name] [name] Female    DOB: April 09, 1946   72 y.o.   MRN: [id number] Visit Date: 01/08/2018  Today's Provider: Mar Daring, PA-C   Chief Complaint  Patient presents with  . Follow-up   Subjective:    HPI   Closed compression fracture of thoracolumbar vertebra with routine healing, Follow up:  The patient was last seen  3 months ago. Changes made since that visit include none. She reports that the TENS unit was helping. Reports that she is having trouble seeing her back.  Patient reports that she fell mid-End of May. Was rolling out of bed as she was taught in rehab and rolled off the bed, hitting her head on her side table, upper back on the bed frame and scraped from low back to upper back along the bed frame as she fell to the floor. Reports no actual injury. She just crawled back into bed and fell asleep. Awoke next morning and had a bruise along her upper back.   Fall Risk  01/08/2018 10/28/2017 09/26/2017 05/20/2017 02/03/2017  Falls in the past year? Yes Yes No No No  Comment Mid-end of May - - - -  Number falls in past yr: 1 1 - - -  Injury with Fall? Yes No - - -  Comment She reports that she hit her head,back and shoulder,tight side  - - - -  Risk for fall due to : - - - - -      No Known Allergies   Current Outpatient Medications:  .  albuterol (PROVENTIL HFA;VENTOLIN HFA) 108 (90 Base) MCG/ACT inhaler, Inhale 2 puffs into the lungs every 4 (four) hours as needed for wheezing or shortness of breath., Disp: 1 Inhaler, Rfl: 1 .  ALPRAZolam (XANAX) 0.5 MG tablet, Take 1 tablet (0.5 mg total) by mouth 2 (two) times daily as needed. for anxiety, Disp: 60 tablet, Rfl: 5 .  aspirin 81 MG tablet, Take 81 mg by mouth daily., Disp: , Rfl:  .  citalopram (CELEXA) 20 MG tablet, Take 1 tablet (20 mg total) by mouth daily., Disp: 30 tablet, Rfl: 0 .  Docusate Sodium (COLACE PO), Take by mouth., Disp: , Rfl:  .  furosemide (LASIX) 20 MG tablet, Take 20 mg by mouth  daily. , Disp: , Rfl:  .  HYDROcodone-acetaminophen (NORCO/VICODIN) 5-325 MG tablet, Take 1 tablet by mouth every 8 (eight) hours as needed for moderate pain., Disp: 90 tablet, Rfl: 0 .  lisinopril (PRINIVIL,ZESTRIL) 40 MG tablet, Take 40 mg by mouth daily. , Disp: , Rfl:  .  metoprolol succinate (TOPROL-XL) 50 MG 24 hr tablet, Take 1 tablet (50 mg total) by mouth daily. QHS, Disp: 90 tablet, Rfl: 1 .  pantoprazole (PROTONIX) 20 MG tablet, Take 20 mg by mouth daily., Disp: , Rfl:  .  potassium chloride SA (K-DUR,KLOR-CON) 20 MEQ tablet, Take 1 tablet (20 mEq total) by mouth daily., Disp: 90 tablet, Rfl: 1 .  simvastatin (ZOCOR) 10 MG tablet, Take 1 tablet (10 mg total) by mouth daily., Disp: 90 tablet, Rfl: 3 .  Tiotropium Bromide Monohydrate (SPIRIVA RESPIMAT) 1.25 MCG/ACT AERS, Inhale 1 puff into the lungs daily., Disp: 4 g, Rfl: 5 .  Fluticasone-Salmeterol (ADVAIR) 250-50 MCG/DOSE AEPB, Inhale 1 puff into the lungs 2 (two) times daily. (Patient not taking: Reported on 01/08/2018), Disp: 14 each, Rfl: 1  Review of Systems  Constitutional: Positive for fatigue.  HENT: Negative.   Respiratory: Positive for cough. Negative  for chest tightness, shortness of breath and wheezing.   Cardiovascular: Negative for chest pain, palpitations and leg swelling.  Gastrointestinal: Negative.   Musculoskeletal: Positive for arthralgias, back pain, gait problem and myalgias.  Neurological: Positive for dizziness ("in the morning" reports it takes her about an hour to ease off).    Social History   Tobacco Use  . Smoking status: Former Smoker    Packs/day: 0.50    Years: 50.00    Pack years: 25.00    Last attempt to quit: 10/18/2017    Years since quitting: 0.2  . Smokeless tobacco: Never Used  Substance Use Topics  . Alcohol use: Yes    Alcohol/week: 4.2 oz    Types: 7 Glasses of wine per week    Comment: 1-2 glasses wine daily   Objective:   BP 110/60 (BP Location: Left Arm, Patient Position:  Sitting, Cuff Size: Normal)   Pulse 60   Temp 98 F (36.7 C) (Oral)   Resp 16   Wt 109 lb 3.2 oz (49.5 kg)   SpO2 98%   BMI 19.34 kg/m     Physical Exam  Constitutional: She appears well-developed and well-nourished. No distress.  Neck: Normal range of motion. Neck supple. No JVD present. No tracheal deviation present. No thyromegaly present.  Cardiovascular: Normal rate and regular rhythm. Exam reveals no gallop and no friction rub.  Murmur heard. Pulmonary/Chest: Effort normal. No respiratory distress. She has wheezes (throughout). She has no rales.  Musculoskeletal:       Cervical back: She exhibits decreased range of motion, tenderness and spasm. She exhibits no bony tenderness.       Thoracic back: She exhibits decreased range of motion, tenderness and bony tenderness. She exhibits no spasm.       Lumbar back: She exhibits decreased range of motion, tenderness and bony tenderness. She exhibits no spasm.  Lymphadenopathy:    She has no cervical adenopathy.  Skin: She is not diaphoretic.  Vitals reviewed.      Assessment & Plan:     1. Fall, initial encounter Fell approx 2-3 weeks ago. Healing ok. No deformity noted. Does have muscle tenderness and mild spasm noted in right upper shoulder and back area. Will start meloxicam as below. However, I am starting her on a short dose of steroid for COPD exacerbation so she will start the meloxicam after completing the steroid. She is also aware she cannot take other NSAIDs, only tylenol. Baclofen will be given for muscle spasm. HH referral placed for PT due to increased falls and high risk for injury with fall. I will see her back in 2-4 weeks.  - meloxicam (MOBIC) 7.5 MG tablet; Take 1 tablet (7.5 mg total) by mouth daily.  Dispense: 30 tablet; Refill: 0 - baclofen (LIORESAL) 10 MG tablet; Take 1 tablet (10 mg total) by mouth 3 (three) times daily.  Dispense: 90 each; Refill: 0 - Ambulatory referral to Home Health  2. GAD  (generalized anxiety disorder) Stable. Diagnosis pulled for medication refill. Continue current medical treatment plan. - citalopram (CELEXA) 20 MG tablet; Take 1 tablet (20 mg total) by mouth daily.  Dispense: 90 tablet; Refill: 1 - ALPRAZolam (XANAX) 0.5 MG tablet; Take 1 tablet (0.5 mg total) by mouth 2 (two) times daily as needed. for anxiety  Dispense: 180 tablet; Refill: 1  3. Chronic obstructive pulmonary disease with acute exacerbation (HCC) Worsening currently since fall. Will give prednisone taper as below. Continue Advair as prescribed. Call if no improvements.  -  Fluticasone-Salmeterol (ADVAIR) 250-50 MCG/DOSE AEPB; Inhale 1 puff into the lungs 2 (two) times daily.  Dispense: 180 each; Refill: 1 - predniSONE (STERAPRED UNI-PAK 21 TAB) 10 MG (21) TBPK tablet; 6 day taper; take as directed on package instructions  Dispense: 21 tablet; Refill: 0  4. Muscle spasm See above medical treatment plan for #1.  - meloxicam (MOBIC) 7.5 MG tablet; Take 1 tablet (7.5 mg total) by mouth daily.  Dispense: 30 tablet; Refill: 0 - baclofen (LIORESAL) 10 MG tablet; Take 1 tablet (10 mg total) by mouth 3 (three) times daily.  Dispense: 90 each; Refill: 0 - Ambulatory referral to [place]  5. Closed compression fracture of thoracolumbar vertebra with routine healing, subsequent encounter Wanting to get off hydrocodone-apap. Will try meloxicam and baclofen as below. Patient does report improvement with TENS unit, but has difficulty getting on by herself if no one is home with her. I will see her back in 2-4 weeks.  - meloxicam (MOBIC) 7.5 MG tablet; Take 1 tablet (7.5 mg total) by mouth daily.  Dispense: 30 tablet; Refill: 0 - baclofen (LIORESAL) 10 MG tablet; Take 1 tablet (10 mg total) by mouth 3 (three) times daily.  Dispense: 90 each; Refill: 0 - Ambulatory referral to [place], PA-C  [facility]

## 2018-01-09 ENCOUNTER — Telehealth: Payer: Self-pay | Admitting: Physician Assistant

## 2018-01-09 NOTE — Telephone Encounter (Signed)
Pt's daughter called wanting to know the name and telephone number to the gastroenterologist that she referred her to back several months ago  Pt's call back is  [phone] [place]

## 2018-01-09 NOTE — Telephone Encounter (Signed)
[facility] and gave the phone number to patient's daughter.

## 2018-01-09 NOTE — Telephone Encounter (Signed)
Patient advised as below.  

## 2018-01-11 ENCOUNTER — Other Ambulatory Visit: Payer: Self-pay | Admitting: Physician Assistant

## 2018-01-11 DIAGNOSIS — F411 Generalized anxiety disorder: Secondary | ICD-10-CM

## 2018-01-14 ENCOUNTER — Other Ambulatory Visit: Payer: Self-pay

## 2018-01-14 ENCOUNTER — Emergency Department
Admission: EM | Admit: 2018-01-14 | Discharge: 2018-01-15 | Disposition: A | Discharge status: Home / Self Care | Payer: Medicare Other | Attending: Emergency Medicine | Admitting: Emergency Medicine

## 2018-01-14 ENCOUNTER — Emergency Department: Payer: Medicare Other

## 2018-01-14 DIAGNOSIS — I10 Essential (primary) hypertension: Secondary | ICD-10-CM

## 2018-01-14 DIAGNOSIS — J449 Chronic obstructive pulmonary disease, unspecified: Secondary | ICD-10-CM | POA: Diagnosis not present

## 2018-01-14 DIAGNOSIS — Z87891 Personal history of nicotine dependence: Secondary | ICD-10-CM | POA: Insufficient documentation

## 2018-01-14 DIAGNOSIS — I2510 Atherosclerotic heart disease of native coronary artery without angina pectoris: Secondary | ICD-10-CM | POA: Diagnosis not present

## 2018-01-14 DIAGNOSIS — E871 Hypo-osmolality and hyponatremia: Secondary | ICD-10-CM | POA: Diagnosis not present

## 2018-01-14 DIAGNOSIS — Z7982 Long term (current) use of aspirin: Secondary | ICD-10-CM | POA: Insufficient documentation

## 2018-01-14 DIAGNOSIS — E876 Hypokalemia: Secondary | ICD-10-CM

## 2018-01-14 DIAGNOSIS — Z79899 Other long term (current) drug therapy: Secondary | ICD-10-CM | POA: Insufficient documentation

## 2018-01-14 DIAGNOSIS — I509 Heart failure, unspecified: Secondary | ICD-10-CM | POA: Diagnosis not present

## 2018-01-14 DIAGNOSIS — I110 Hypertensive heart disease with heart failure: Secondary | ICD-10-CM | POA: Diagnosis not present

## 2018-01-14 DIAGNOSIS — R0602 Shortness of breath: Secondary | ICD-10-CM | POA: Diagnosis not present

## 2018-01-14 DIAGNOSIS — R202 Paresthesia of skin: Secondary | ICD-10-CM | POA: Diagnosis present

## 2018-01-14 LAB — CBC WITH DIFFERENTIAL/PLATELET
Basophils Absolute: 0 10*3/uL (ref 0–0.1)
Basophils Relative: 0 %
EOS ABS: 0.1 10*3/uL (ref 0–0.7)
EOS PCT: 1 %
HCT: 36.9 % (ref 35.0–47.0)
HEMOGLOBIN: 12.6 g/dL (ref 12.0–16.0)
LYMPHS ABS: 2.5 10*3/uL (ref 1.0–3.6)
Lymphocytes Relative: 30 %
MCH: 33 pg (ref 26.0–34.0)
MCHC: 34.2 g/dL (ref 32.0–36.0)
MCV: 96.5 fL (ref 80.0–100.0)
MONO ABS: 0.7 10*3/uL (ref 0.2–0.9)
MONOS PCT: 8 %
Neutro Abs: 5.1 10*3/uL (ref 1.4–6.5)
Neutrophils Relative %: 61 %
PLATELETS: 256 10*3/uL (ref 150–440)
RBC: 3.83 MIL/uL (ref 3.80–5.20)
RDW: 16.1 % — AB (ref 11.5–14.5)
WBC: 8.4 10*3/uL (ref 3.6–11.0)

## 2018-01-14 LAB — COMPREHENSIVE METABOLIC PANEL
ALK PHOS: 76 U/L (ref 38–126)
ALT: 10 U/L — AB (ref 14–54)
ANION GAP: 12 (ref 5–15)
AST: 24 U/L (ref 15–41)
Albumin: 3.8 g/dL (ref 3.5–5.0)
BILIRUBIN TOTAL: 0.8 mg/dL (ref 0.3–1.2)
BUN: 12 mg/dL (ref 6–20)
CALCIUM: 8.5 mg/dL — AB (ref 8.9–10.3)
CO2: 24 mmol/L (ref 22–32)
CREATININE: 0.81 mg/dL (ref 0.44–1.00)
Chloride: 93 mmol/L — ABNORMAL LOW (ref 101–111)
GFR calc Af Amer: >60 mL/min (ref >60)
GFR calc non Af Amer: >60 mL/min (ref >60)
Glucose, Bld: 133 mg/dL — ABNORMAL HIGH (ref 65–99)
Potassium: 2.9 mmol/L — ABNORMAL LOW (ref 3.5–5.1)
Sodium: 129 mmol/L — ABNORMAL LOW (ref 135–145)
TOTAL PROTEIN: 7.2 g/dL (ref 6.5–8.1)

## 2018-01-14 LAB — TROPONIN I: Troponin I: 0.03 ng/mL (ref <0.03)

## 2018-01-14 MED ORDER — POTASSIUM CHLORIDE 20 MEQ PO PACK
PACK | ORAL | Status: AC
  Filled 2018-01-14: qty 2

## 2018-01-14 MED ORDER — SODIUM CHLORIDE 0.9 % IV BOLUS
1000.0000 mL | Freq: Once | INTRAVENOUS | Status: AC
  Administered 2018-01-14: 1000 mL via INTRAVENOUS

## 2018-01-14 MED ORDER — POTASSIUM CHLORIDE 20 MEQ PO PACK
40.0000 meq | PACK | Freq: Once | ORAL | Status: AC
  Administered 2018-01-14: 40 meq via ORAL

## 2018-01-14 MED ORDER — POTASSIUM CHLORIDE CRYS ER 20 MEQ PO TBCR: 40.0000 meq | EXTENDED_RELEASE_TABLET | Freq: Once | ORAL | Status: DC

## 2018-01-14 MED ORDER — GABAPENTIN 300 MG PO CAPS: 300.0000 mg | ORAL_CAPSULE | Freq: Three times a day (TID) | ORAL | 0 refills | Status: DC | PRN

## 2018-01-14 MED ORDER — GABAPENTIN 600 MG PO TABS
300.0000 mg | ORAL_TABLET | Freq: Once | ORAL | Status: AC
  Administered 2018-01-14: 300 mg via ORAL
  Filled 2018-01-14: qty 1

## 2018-01-14 NOTE — ED Provider Notes (Signed)
[facility] Emergency Department Provider Note  ____________________________________________   I have reviewed the triage vital signs and the nursing notes.   HISTORY  Chief Complaint Hypertension   History limited by: Not Limited   HPI [name] [name] is a 72 y.o. female who presents to the emergency department today because of concern for high blood pressure. States it has been high the past couple of days. Thinks that it may be related to her being taken off of her pain medication.  She states that when her blood pressure is gotten high she has felt tingling in her arms and legs.  She did take blood pressure medication this evening.  At the time my exam she denies any other symptoms.  She does have secondary complaint of back pain.  She states that this is been present for months.  Daughter states she has been on narcotic pain medication for 10 weeks and that they are try to take her off of it.  They did switch her to muscle relaxers which she states are not helping.    Per medical record review patient has a history of hypertension.  Past Medical History:  Diagnosis Date  . Anxiety   . CAD (coronary artery disease) unk  . COPD (chronic obstructive pulmonary disease) ([place])   . Depression   . Hypercholesteremia unk  . Hypertension     Patient Active Problem List   Diagnosis Date Noted  . Closed compression fracture of thoracolumbar vertebra ([place]) 10/28/2017  . 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 Renal cyst 10/28/2017  . Hypokalemia 10/18/2017  . Chronic congestive heart failure ([place]) 09/26/2017  . Alcohol abuse 08/28/2016  . Pedal edema 03/13/2016  . Osteoporosis 09/07/2015  . Pleural effusion 02/03/2015  . Rib fracture 02/02/2015  . Body mass index (BMI) of 23.0-23.9 in adult 12/29/2014  . Gastric ulcer 12/29/2014  . H/O transient cerebral ischemia 12/29/2014  . HLD (hyperlipidemia) 12/29/2014  . BP (high blood pressure)  12/29/2014  . Below normal amount of sodium in the blood 12/29/2014  . Awareness of heartbeats 12/29/2014  . Abnormal Pap smear of vagina 12/29/2014  . Plantar fasciitis 12/29/2014  . Esophageal reflux 12/29/2014  . 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 TI (tricuspid incompetence) 12/31/2013  . Chest pain 12/31/2013  . Chronic obstructive pulmonary disease ([place]) 12/31/2013  . Abnormal blood sugar 03/01/2009  . GAD (generalized anxiety disorder) 02/15/2009  . Current tobacco use 06/26/2006    Past Surgical History:  Procedure Laterality Date  . ABDOMINAL HYSTERECTOMY  1982  . KYPHOPLASTY N/A 10/23/2017   Procedure: [id number];  Surgeon: Hessie Knows, MD;  Location: [facility] ORS;  Service: Orthopedics;  Laterality: N/A;    Prior to Admission medications   Medication Sig Start Date End Date Taking? Authorizing Provider  albuterol (PROVENTIL HFA;VENTOLIN HFA) 108 (90 Base) MCG/ACT inhaler Inhale 2 puffs into the lungs every 4 (four) hours as needed for wheezing or shortness of breath. 09/26/17   Mar Daring, PA-C  ALPRAZolam Duanne Moron) 0.5 MG tablet TAKE 1 TABLET BY MOUTH TWICE DAILY AS NEEDED FOR ANXIETY 01/12/18   Mar Daring, PA-C  aspirin 81 MG tablet Take 81 mg by mouth daily.    [provider]  baclofen (LIORESAL) 10 MG tablet Take 1 tablet (10 mg total) by mouth 3 (three) times daily. 01/08/18   Mar Daring, PA-C  citalopram (CELEXA) 20 MG tablet Take 1 tablet (20 mg total) by mouth daily. 01/08/18   Burnette,  Clearnce Sorrel, PA-C  Docusate Sodium (COLACE PO) Take by mouth.    [provider]  Fluticasone-Salmeterol (ADVAIR) 250-50 MCG/DOSE AEPB Inhale 1 puff into the lungs 2 (two) times daily. 01/08/18   Mar Daring, PA-C  furosemide (LASIX) 20 MG tablet Take 20 mg by mouth daily.     [provider]  lisinopril (PRINIVIL,ZESTRIL) 40 MG tablet Take 40 mg by mouth daily.  11/24/13   [provider]  meloxicam (MOBIC) 7.5  MG tablet Take 1 tablet (7.5 mg total) by mouth daily. 01/08/18   Mar Daring, PA-C  metoprolol succinate (TOPROL-XL) 50 MG 24 hr tablet Take 1 tablet (50 mg total) by mouth daily. QHS 11/21/17   Mar Daring, PA-C  pantoprazole (PROTONIX) 20 MG tablet Take 20 mg by mouth daily.    [provider]  potassium chloride SA (K-DUR,KLOR-CON) 20 MEQ tablet Take 1 tablet (20 mEq total) by mouth daily. 10/01/17   Mar Daring, PA-C  predniSONE (STERAPRED UNI-PAK 21 TAB) 10 MG (21) TBPK tablet 6 day taper; take as directed on package instructions 01/08/18   Mar Daring, PA-C  simvastatin (ZOCOR) 10 MG tablet Take 1 tablet (10 mg total) by mouth daily. 03/24/17   Mar Daring, PA-C  Tiotropium Bromide Monohydrate (SPIRIVA RESPIMAT) 1.25 MCG/ACT AERS Inhale 1 puff into the lungs daily. 09/26/17   Mar Daring, PA-C    Allergies Patient has no known allergies.  Family History  Problem Relation Age of Onset  . Leukemia Mother   . Aneurysm Father   . Diabetes Father   . Heart disease Father   . Diabetes Maternal Grandmother   . Diabetes Paternal Grandmother     Social History Social History   Tobacco Use  . Smoking status: Former Smoker    Packs/day: 0.50    Years: 50.00    Pack years: 25.00    Last attempt to quit: 10/18/2017    Years since quitting: 0.2  . Smokeless tobacco: Never Used  Substance Use Topics  . Alcohol use: Yes    Alcohol/week: 4.2 oz    Types: 7 Glasses of wine per week    Comment: 1-2 glasses wine daily  . Drug use: No    Review of Systems Constitutional: No fever/chills Eyes: No visual changes. ENT: No sore throat. Cardiovascular: Denies chest pain. Respiratory: Denies shortness of breath. Gastrointestinal: No abdominal pain.  No nausea, no vomiting.  No diarrhea.   Genitourinary: Negative for dysuria. Musculoskeletal: Positive for back pain. Skin: Negative for rash. Neurological: tingling of the arms and  legs.  ____________________________________________   PHYSICAL EXAM:  VITAL SIGNS: ED Triage Vitals  Enc Vitals Group     BP 01/14/18 2043 (!) 144/80     Pulse Rate 01/14/18 2043 73     Resp 01/14/18 2043 12     Temp 01/14/18 2043 98.7 F (37.1 C)     Temp Source 01/14/18 2043 Oral     SpO2 01/14/18 2043 97 %     Weight 01/14/18 2041 108 lb (49 kg)     Height 01/14/18 2041 5\' 3"  (1.6 m)     Head Circumference --      Peak Flow --      Pain Score 01/14/18 2049 8   Constitutional: Alert and oriented.  Eyes: Conjunctivae are normal.  ENT      Head: Normocephalic and atraumatic.      Nose: No congestion/rhinnorhea.      Mouth/Throat: Mucous  membranes are moist.      Neck: No stridor. Hematological/Lymphatic/Immunilogical: No cervical lymphadenopathy. Cardiovascular: Normal rate, regular rhythm.  No murmurs, rubs, or gallops.  Respiratory: Normal respiratory effort without tachypnea nor retractions. Breath sounds are clear and equal bilaterally. No wheezes/rales/rhonchi. Gastrointestinal: Soft and non tender. No rebound. No guarding.  Genitourinary: Deferred Musculoskeletal: Normal range of motion in all extremities. Tender to palpation on the lower spine.  Neurologic:  Normal speech and language. No gross focal neurologic deficits are appreciated.  Skin:  Skin is warm, dry and intact. No rash noted. Psychiatric: Mood and affect are normal. Speech and behavior are normal. Patient exhibits appropriate insight and judgment.  ____________________________________________    LABS (pertinent positives/negatives)  CMP na 129, k 2.9, cr 0.81 Trop 0.03  ____________________________________________   EKG  I, Nance Pear, attending physician, personally viewed and interpreted this EKG  EKG Time: 2059 Rate: 67 Rhythm: normal sinus rhythm Axis: normal Intervals: qtc 481 QRS: LVH, q waves v1, v2 ST changes: no st elevation Impression: abnormal  ekg   ____________________________________________    RADIOLOGY  CXR Cardiomegaly  ____________________________________________   PROCEDURES  Procedures  ____________________________________________   INITIAL IMPRESSION / ASSESSMENT AND PLAN / ED COURSE  Pertinent labs & imaging results that were available during my care of the patient were reviewed by me and considered in my medical decision making (see chart for details).   Patient presented to the emergency department today because of concerns for high blood pressure.  Blood pressure is better here.  In terms of the patient's tingling this could be explained by the low potassium and low sodium.  Patient did feel better with gabapentin for back pain.  Discussed blood work findings with the patient.  Will replete.  ____________________________________________   FINAL CLINICAL IMPRESSION(S) / ED DIAGNOSES  Final diagnoses:  Hypertension, unspecified type  Hypokalemia  Hyponatremia     Note: This dictation was prepared with Dragon dictation. Any transcriptional errors that result from this process are unintentional     Nance Pear, MD 01/14/18 2330

## 2018-01-14 NOTE — Discharge Instructions (Signed)
Please seek medical attention for any high fevers, chest pain, shortness of breath, change in behavior, persistent vomiting, bloody stool or any other new or concerning symptoms.  

## 2018-01-14 NOTE — ED Notes (Signed)
Dr. Archie Balboa aware of pt troponin result.

## 2018-01-14 NOTE — ED Triage Notes (Signed)
Pt states here for htn states was 580 systolic at home, states also had tingling to all extremities. States does have back problems that have caused her tingling in the past. Pt was recently taken off of her pain meds and was switched to muscle relaxants. Pt co dizziness and shob as well.

## 2018-01-15 ENCOUNTER — Telehealth: Payer: Self-pay | Admitting: Physician Assistant

## 2018-01-15 NOTE — Telephone Encounter (Signed)
Bone density scheduled at [place] 01/22/18 at 3:00

## 2018-01-15 NOTE — ED Notes (Signed)
Topez not working 

## 2018-01-19 ENCOUNTER — Encounter: Payer: Self-pay | Admitting: Physician Assistant

## 2018-01-19 ENCOUNTER — Ambulatory Visit: Payer: Medicare Other | Admitting: Physician Assistant

## 2018-01-19 VITALS — BP 180/90 | HR 73 | Temp 98.0°F | Resp 16 | Wt 106.6 lb

## 2018-01-19 DIAGNOSIS — S22000A Wedge compression fracture of unspecified thoracic vertebra, initial encounter for closed fracture: Secondary | ICD-10-CM | POA: Diagnosis not present

## 2018-01-19 MED ORDER — HYDROCODONE-ACETAMINOPHEN 5-325 MG PO TABS: 1.0000 | ORAL_TABLET | ORAL | 0 refills | Status: DC | PRN

## 2018-01-19 NOTE — Progress Notes (Signed)
Patient: [name] [name] Female    DOB: 1946/02/25   72 y.o.   MRN: [id number] Visit Date: 01/19/2018  Today's Provider: Mar Daring, PA-C   Chief Complaint  Patient presents with  . Follow-up   Subjective:    HPI  Follow Up ER Visit  Patient is here for ER follow up.  She was recently seen at [facility] for Hypertension and secondary complaint of back pain on 01/14/18. Treatment for this included Gabapentin for her back. Reports the Gabapentin made her feel like she was "dying" She reports poor compliance with treatment. Stopped gabapenitn and baclofen. She reports this condition is Unchanged. Per daughter patient's blood pressure reading are still high. Reports this morning it was 198/92 and before coming to the appointment it was 168/72.  Upon review of CXR it is found that since March (most likely occurred after her fall on 01/08/18) she now has two new compression fractures in her thoracic vertebrae in T4 and T6. She feels her BP is elevated secondary to back pain. She reports having pain radiate around her ribcage in a girdle type fashion.  ------------------------------------------------------------------------------------    No Known Allergies   Current Outpatient Medications:  .  ALPRAZolam (XANAX) 0.5 MG tablet, TAKE 1 TABLET BY MOUTH TWICE DAILY AS NEEDED FOR ANXIETY, Disp: 60 tablet, Rfl: 5 .  aspirin 81 MG tablet, Take 81 mg by mouth daily., Disp: , Rfl:  .  citalopram (CELEXA) 20 MG tablet, Take 1 tablet (20 mg total) by mouth daily., Disp: 90 tablet, Rfl: 1 .  Docusate Sodium (COLACE PO), Take 100 mg by mouth daily as needed (constipation). , Disp: , Rfl:  .  furosemide (LASIX) 20 MG tablet, Take 20 mg by mouth daily as needed for fluid. , Disp: , Rfl:  .  lisinopril (PRINIVIL,ZESTRIL) 40 MG tablet, Take 40 mg by mouth daily. , Disp: , Rfl:  .  meloxicam (MOBIC) 7.5 MG tablet, Take 1 tablet (7.5 mg total) by mouth daily., Disp: 30 tablet, Rfl: 0 .   metoprolol succinate (TOPROL-XL) 50 MG 24 hr tablet, Take 1 tablet (50 mg total) by mouth daily. QHS, Disp: 90 tablet, Rfl: 1 .  mometasone-formoterol (DULERA) 200-5 MCG/ACT AERO, Inhale 2 puffs into the lungs 2 (two) times daily., Disp: , Rfl:  .  pantoprazole (PROTONIX) 20 MG tablet, Take 20 mg by mouth daily as needed for heartburn. , Disp: , Rfl:  .  potassium chloride SA (K-DUR,KLOR-CON) 20 MEQ tablet, Take 1 tablet (20 mEq total) by mouth daily., Disp: 90 tablet, Rfl: 1 .  simvastatin (ZOCOR) 10 MG tablet, Take 1 tablet (10 mg total) by mouth daily., Disp: 90 tablet, Rfl: 3 .  Tiotropium Bromide Monohydrate (SPIRIVA RESPIMAT) 1.25 MCG/ACT AERS, Inhale 1 puff into the lungs daily., Disp: 4 g, Rfl: 5 .  albuterol (PROVENTIL HFA;VENTOLIN HFA) 108 (90 Base) MCG/ACT inhaler, Inhale 2 puffs into the lungs every 4 (four) hours as needed for wheezing or shortness of breath. (Patient taking differently: Inhale 2 puffs into the lungs every 4 (four) hours as needed for wheezing or shortness of breath. ), Disp: 1 Inhaler, Rfl: 1 .  baclofen (LIORESAL) 10 MG tablet, Take 1 tablet (10 mg total) by mouth 3 (three) times daily. (Patient not taking: Reported on 01/14/2018), Disp: 90 each, Rfl: 0 .  Fluticasone-Salmeterol (ADVAIR) 250-50 MCG/DOSE AEPB, Inhale 1 puff into the lungs 2 (two) times daily. (Patient not taking: Reported on 01/14/2018), Disp: 180 each, Rfl: 1 .  gabapentin (NEURONTIN) 300 MG capsule, Take 1 capsule (300 mg total) by mouth every 8 (eight) hours as needed (severe back pain). (Patient not taking: Reported on 01/19/2018), Disp: 15 capsule, Rfl: 0 .  predniSONE (STERAPRED UNI-PAK 21 TAB) 10 MG (21) TBPK tablet, 6 day taper; take as directed on package instructions (Patient not taking: Reported on 01/14/2018), Disp: 21 tablet, Rfl: 0  Review of Systems  Constitutional: Positive for fatigue.  Respiratory: Positive for shortness of breath. Negative for cough and chest tightness.   Cardiovascular:  Negative.   Gastrointestinal: Negative.   Musculoskeletal: Positive for back pain.  Neurological: Negative.     Social History   Tobacco Use  . Smoking status: Former Smoker    Packs/day: 0.50    Years: 50.00    Pack years: 25.00    Last attempt to quit: 10/18/2017    Years since quitting: 0.2  . Smokeless tobacco: Never Used  Substance Use Topics  . Alcohol use: Yes    Alcohol/week: 4.2 oz    Types: 7 Glasses of wine per week    Comment: 1-2 glasses wine daily   Objective:   BP (!) 180/90 (BP Location: Left Arm, Patient Position: Sitting, Cuff Size: Normal)   Pulse 73   Temp 98 F (36.7 C) (Oral)   Resp 16   Wt 106 lb 9.6 oz (48.4 kg)   SpO2 98%   BMI 18.88 kg/m     Physical Exam  Constitutional: She appears well-developed and well-nourished. No distress.  Neck: Normal range of motion. Neck supple.  Cardiovascular: Normal rate, regular rhythm and normal heart sounds. Exam reveals no gallop and no friction rub.  No murmur heard. Pulmonary/Chest: Effort normal and breath sounds normal. No respiratory distress. She has no wheezes. She has no rales.  Musculoskeletal:       Thoracic back: She exhibits decreased range of motion and bony tenderness. She exhibits no pain and no spasm.       Lumbar back: She exhibits decreased range of motion and bony tenderness.  Skin: She is not diaphoretic.  Vitals reviewed.      Assessment & Plan:     1. Compression fracture of body of thoracic vertebra (HCC) New compression fractures in T4 and T6 noted on CXR most likely secondary to her fall from her bed on 01/08/18. Will restart Norco as below. She and her daughter are to call me/mychart message me by [facility] of this week to let me know how her BP is running with pain control. If still elevated will start amlodipine for BP control. Continue Metoprolol 50mg  and Lisinopril 40mg  for now. I will see her back in 02/04/18 and we will repeat thoracic spine xray for checking for healing of  fractures and recheck labs (low sodium, potassium and calcium noted from ER visit). They are to call if symptoms do not improve or worsen in the meantime.  - HYDROcodone-acetaminophen (NORCO/VICODIN) 5-325 MG tablet; Take 1 tablet by mouth every 4 (four) hours as needed for moderate pain or severe pain.  Dispense: 90 tablet; Refill: 0       Mar Daring, PA-C  [facility]

## 2018-01-19 NOTE — Patient Instructions (Signed)
Vertebral Fracture A vertebral fracture means that one of the bones in the spine is broken (fractured). These bones are called vertebrae. You may have back pain that gets worse when you move. Vertebral fractures can be mild or severe. Many will get better without surgery. Surgery may be needed for more severe breaks. Follow these instructions at home: General instructions  Take medicines only as told by your doctor.  Do not drive or use heavy machinery while taking pain medicine.  If told, put ice on the injured area: ? Put ice in a plastic bag. ? Place a towel between your skin and the bag. ? Leave the ice on for 30 minutes every 2 hours at first. Then use the ice as needed.  Wear your neck brace or back brace as told by your doctor.  Do not drink alcohol.  Keep all follow-up visits as told by your doctor. This is important. Activity  Stay in bed (on bed rest) only as told by your doctor. Being on bed rest for too long can make your condition worse.  Return to your normal activities as told by your doctor. Ask your doctor what is safe for you to do.  Do exercises for your back (physical therapy) as told by your doctor.  Exercise often as told by your doctor. Contact a doctor if:  You have a fever.  You have a cough that makes your pain worse.  Your pain medicine is not helping.  Your pain does not get better over time.  You cannot return to your normal activities as planned. Get help right away if:  Your pain is very bad and it suddenly gets worse.  You are not able to move any body part (paralysis) that is below the level of your injury.  You have numbness, tingling, or weakness in any body part that is below the level of your injury.  You cannot control when you pee (urinate) or when you poop (have bowel movements). This information is not intended to replace advice given to you by your health care provider. Make sure you discuss any questions you have with your  health care provider. Document Released: 01/02/2010 Document Revised: 12/21/2015 Document Reviewed: 07/20/2014 Elsevier Interactive Patient Education  2018 Elsevier Inc.  

## 2018-01-21 ENCOUNTER — Encounter: Payer: Self-pay | Admitting: Physician Assistant

## 2018-01-26 DIAGNOSIS — M810 Age-related osteoporosis without current pathological fracture: Secondary | ICD-10-CM | POA: Diagnosis not present

## 2018-01-26 DIAGNOSIS — S22050D Wedge compression fracture of T5-T6 vertebra, subsequent encounter for fracture with routine healing: Secondary | ICD-10-CM | POA: Diagnosis not present

## 2018-01-26 DIAGNOSIS — J449 Chronic obstructive pulmonary disease, unspecified: Secondary | ICD-10-CM | POA: Diagnosis not present

## 2018-01-26 DIAGNOSIS — F329 Major depressive disorder, single episode, unspecified: Secondary | ICD-10-CM | POA: Diagnosis not present

## 2018-01-26 DIAGNOSIS — K219 Gastro-esophageal reflux disease without esophagitis: Secondary | ICD-10-CM | POA: Diagnosis not present

## 2018-01-26 DIAGNOSIS — I252 Old myocardial infarction: Secondary | ICD-10-CM | POA: Diagnosis not present

## 2018-01-26 DIAGNOSIS — S32000D Wedge compression fracture of unspecified lumbar vertebra, subsequent encounter for fracture with routine healing: Secondary | ICD-10-CM | POA: Diagnosis not present

## 2018-01-26 DIAGNOSIS — I110 Hypertensive heart disease with heart failure: Secondary | ICD-10-CM | POA: Diagnosis not present

## 2018-01-26 DIAGNOSIS — S22040D Wedge compression fracture of fourth thoracic vertebra, subsequent encounter for fracture with routine healing: Secondary | ICD-10-CM | POA: Diagnosis not present

## 2018-01-26 DIAGNOSIS — I509 Heart failure, unspecified: Secondary | ICD-10-CM | POA: Diagnosis not present

## 2018-01-26 DIAGNOSIS — F1010 Alcohol abuse, uncomplicated: Secondary | ICD-10-CM | POA: Diagnosis not present

## 2018-01-26 DIAGNOSIS — M1990 Unspecified osteoarthritis, unspecified site: Secondary | ICD-10-CM | POA: Diagnosis not present

## 2018-01-26 DIAGNOSIS — F419 Anxiety disorder, unspecified: Secondary | ICD-10-CM | POA: Diagnosis not present

## 2018-01-28 ENCOUNTER — Telehealth: Payer: Self-pay

## 2018-01-28 NOTE — Telephone Encounter (Signed)
Stephanie advised.

## 2018-01-28 NOTE — Telephone Encounter (Signed)
[name] [name] with [facility] calling for verbal orders for occupational therapy. 1 x a week for one week and 2 times a week for 3 week.  CB# [phone]

## 2018-01-28 NOTE — Telephone Encounter (Signed)
Yes this is ok 

## 2018-02-02 ENCOUNTER — Encounter: Payer: Self-pay | Admitting: Physician Assistant

## 2018-02-04 ENCOUNTER — Ambulatory Visit
Admission: RE | Admit: 2018-02-04 | Discharge: 2018-02-04 | Disposition: A | Discharge status: Home / Self Care | Payer: Medicare Other | Source: Ambulatory Visit | Attending: Physician Assistant | Admitting: Physician Assistant

## 2018-02-04 ENCOUNTER — Other Ambulatory Visit: Payer: Self-pay | Admitting: Physician Assistant

## 2018-02-04 ENCOUNTER — Encounter: Payer: Self-pay | Admitting: Physician Assistant

## 2018-02-04 ENCOUNTER — Ambulatory Visit: Payer: Medicare Other | Admitting: Physician Assistant

## 2018-02-04 VITALS — BP 110/58 | HR 57 | Temp 97.8°F | Resp 16 | Wt 106.0 lb

## 2018-02-04 DIAGNOSIS — S22080D Wedge compression fracture of T11-T12 vertebra, subsequent encounter for fracture with routine healing: Secondary | ICD-10-CM

## 2018-02-04 DIAGNOSIS — S32009D Unspecified fracture of unspecified lumbar vertebra, subsequent encounter for fracture with routine healing: Secondary | ICD-10-CM | POA: Insufficient documentation

## 2018-02-04 DIAGNOSIS — S32010D Wedge compression fracture of first lumbar vertebra, subsequent encounter for fracture with routine healing: Principal | ICD-10-CM

## 2018-02-04 DIAGNOSIS — Z1231 Encounter for screening mammogram for malignant neoplasm of breast: Secondary | ICD-10-CM

## 2018-02-04 DIAGNOSIS — S22009D Unspecified fracture of unspecified thoracic vertebra, subsequent encounter for fracture with routine healing: Secondary | ICD-10-CM | POA: Insufficient documentation

## 2018-02-04 DIAGNOSIS — S299XXA Unspecified injury of thorax, initial encounter: Secondary | ICD-10-CM | POA: Diagnosis not present

## 2018-02-04 DIAGNOSIS — X58XXXD Exposure to other specified factors, subsequent encounter: Secondary | ICD-10-CM | POA: Diagnosis not present

## 2018-02-04 DIAGNOSIS — I10 Essential (primary) hypertension: Secondary | ICD-10-CM | POA: Diagnosis not present

## 2018-02-04 DIAGNOSIS — M546 Pain in thoracic spine: Secondary | ICD-10-CM | POA: Diagnosis not present

## 2018-02-04 MED ORDER — METOPROLOL SUCCINATE ER 25 MG PO TB24: 25.0000 mg | ORAL_TABLET | Freq: Every day | ORAL | 3 refills | Status: DC

## 2018-02-04 NOTE — Progress Notes (Signed)
Patient: [name] [name] Female    DOB: 1945-09-21   72 y.o.   MRN: [id number] Visit Date: 02/04/2018  Today's Provider: Mar Daring, PA-C   Chief Complaint  Patient presents with  . Follow-up    compression fracture and blood pressure   Subjective:    HPI Patient here today to follow up from office visit 01/19/18, patient was restarted on Norco for new compression fractures. Patient reports norco 1 tablet every 6 hours, pt reports mild pain control.  Patient advised to continue taking Lisinopril 40 mg and Metoprolol 50 mg daily. Patient reports that this morning her blood pressure was 95/57, patient reports shakiness this morning. Patient reports eating and drinking well. Patient reports that her blood pressure was 140/73 yesterday.     No Known Allergies   Current Outpatient Medications:  .  albuterol (PROVENTIL HFA;VENTOLIN HFA) 108 (90 Base) MCG/ACT inhaler, Inhale 2 puffs into the lungs every 4 (four) hours as needed for wheezing or shortness of breath. (Patient taking differently: Inhale 2 puffs into the lungs every 4 (four) hours as needed for wheezing or shortness of breath. ), Disp: 1 Inhaler, Rfl: 1 .  ALPRAZolam (XANAX) 0.5 MG tablet, TAKE 1 TABLET BY MOUTH TWICE DAILY AS NEEDED FOR ANXIETY, Disp: 60 tablet, Rfl: 5 .  aspirin 81 MG tablet, Take 81 mg by mouth daily., Disp: , Rfl:  .  citalopram (CELEXA) 20 MG tablet, Take 1 tablet (20 mg total) by mouth daily., Disp: 90 tablet, Rfl: 1 .  Docusate Sodium (COLACE PO), Take 100 mg by mouth daily as needed (constipation). , Disp: , Rfl:  .  furosemide (LASIX) 20 MG tablet, Take 20 mg by mouth daily as needed for fluid. , Disp: , Rfl:  .  HYDROcodone-acetaminophen (NORCO/VICODIN) 5-325 MG tablet, Take 1 tablet by mouth every 4 (four) hours as needed for moderate pain or severe pain., Disp: 90 tablet, Rfl: 0 .  ibuprofen (ADVIL,MOTRIN) 600 MG tablet, Take 600 mg by mouth every 8 (eight) hours as needed., Disp: , Rfl:  .   lisinopril (PRINIVIL,ZESTRIL) 40 MG tablet, Take 40 mg by mouth daily. , Disp: , Rfl:  .  metoprolol succinate (TOPROL-XL) 50 MG 24 hr tablet, Take 1 tablet (50 mg total) by mouth daily. QHS, Disp: 90 tablet, Rfl: 1 .  mometasone-formoterol (DULERA) 200-5 MCG/ACT AERO, Inhale 2 puffs into the lungs 2 (two) times daily., Disp: , Rfl:  .  pantoprazole (PROTONIX) 20 MG tablet, Take 20 mg by mouth daily as needed for heartburn. , Disp: , Rfl:  .  potassium chloride SA (K-DUR,KLOR-CON) 20 MEQ tablet, Take 1 tablet (20 mEq total) by mouth daily., Disp: 90 tablet, Rfl: 1 .  simvastatin (ZOCOR) 10 MG tablet, Take 1 tablet (10 mg total) by mouth daily., Disp: 90 tablet, Rfl: 3 .  Tiotropium Bromide Monohydrate (SPIRIVA RESPIMAT) 1.25 MCG/ACT AERS, Inhale 1 puff into the lungs daily., Disp: 4 g, Rfl: 5  Review of Systems  Constitutional: Negative.   Respiratory: Negative.   Cardiovascular: Negative.   Gastrointestinal: Negative.   Musculoskeletal: Positive for back pain and myalgias. Negative for gait problem.  Neurological: Positive for tremors.    Social History   Tobacco Use  . Smoking status: Former Smoker    Packs/day: 0.50    Years: 50.00    Pack years: 25.00    Last attempt to quit: 10/18/2017    Years since quitting: 0.2  . Smokeless tobacco: Never Used  Substance  Use Topics  . Alcohol use: Yes    Alcohol/week: 4.2 oz    Types: 7 Glasses of wine per week    Comment: 1-2 glasses wine daily   Objective:   BP (!) 110/58 (BP Location: Left Arm, Patient Position: Sitting, Cuff Size: Normal)   Pulse (!) 57   Temp 97.8 F (36.6 C) (Oral)   Resp 16   Wt 106 lb (48.1 kg)   SpO2 93%   BMI 18.78 kg/m  Vitals:   02/04/18 1337  BP: (!) 110/58  Pulse: (!) 57  Resp: 16  Temp: 97.8 F (36.6 C)  TempSrc: Oral  SpO2: 93%  Weight: 106 lb (48.1 kg)     Physical Exam  Constitutional: She appears well-developed and well-nourished. No distress.  Neck: Normal range of motion. Neck  supple.  Cardiovascular: Normal rate, regular rhythm and normal heart sounds. Exam reveals no gallop and no friction rub.  No murmur heard. Pulmonary/Chest: Effort normal and breath sounds normal. No respiratory distress. She has no wheezes. She has no rales.  Skin: She is not diaphoretic.  Vitals reviewed.      Assessment & Plan:     1. Essential hypertension BP and HR low today. Will decrease metoprolol to 25mg  XR as below from 50mg  XR. Call if BP starts running high again with change.  - metoprolol succinate (TOPROL-XL) 25 MG 24 hr tablet; Take 1 tablet (25 mg total) by mouth daily.  Dispense: 90 tablet; Refill: 3  2. Closed compression fracture of thoracolumbar vertebra with routine healing, subsequent encounter Will get reimaging as below to check for healing of T4 and T6 compression fractures. I will f/u pending xrays.  - DG Thoracic Spine W/Swimmers; Future       Mar Daring, PA-C  [facility]

## 2018-02-05 ENCOUNTER — Telehealth: Payer: Self-pay

## 2018-02-05 NOTE — Telephone Encounter (Signed)
-----   Message from Mar Daring, PA-C sent at 02/05/2018  8:57 AM EDT ----- Compression fracture changes stable at T4 and T6. I was discussing with Dr. B and she mentioned a new treatment for pain from compression fractures that we can discuss at your next visit. I can see you back in 4-6 weeks and we can discuss the change. It would get you off the narcotic without [place] having to go to a different narcotic.

## 2018-02-05 NOTE — Telephone Encounter (Signed)
Viewed by Brenton Grills on 02/05/2018 10:05 AM   Patient scheduled for 08/06 at 1:20 pm.  Thanks,  -Joseline

## 2018-02-13 ENCOUNTER — Other Ambulatory Visit: Payer: Self-pay | Admitting: Physician Assistant

## 2018-02-13 DIAGNOSIS — S22000A Wedge compression fracture of unspecified thoracic vertebra, initial encounter for closed fracture: Secondary | ICD-10-CM

## 2018-02-13 MED ORDER — HYDROCODONE-ACETAMINOPHEN 5-325 MG PO TABS: 1.0000 | ORAL_TABLET | Freq: Four times a day (QID) | ORAL | 0 refills | Status: DC | PRN

## 2018-02-13 NOTE — Telephone Encounter (Signed)
Pt contacted office for refill request on the following medications:  HYDROcodone-acetaminophen (NORCO/VICODIN) 5-325 MG tablet   [facility]  Last Rx: 01/19/18 LOV: 02/04/18 Please advise. Thanks TNP

## 2018-02-26 DIAGNOSIS — J449 Chronic obstructive pulmonary disease, unspecified: Secondary | ICD-10-CM | POA: Diagnosis not present

## 2018-03-03 ENCOUNTER — Encounter: Payer: Self-pay | Admitting: Physician Assistant

## 2018-03-03 ENCOUNTER — Ambulatory Visit
Admission: RE | Admit: 2018-03-03 | Discharge: 2018-03-03 | Disposition: A | Discharge status: Home / Self Care | Payer: Medicare Other | Source: Ambulatory Visit | Attending: Physician Assistant | Admitting: Physician Assistant

## 2018-03-03 ENCOUNTER — Ambulatory Visit: Payer: Medicare Other | Admitting: Physician Assistant

## 2018-03-03 VITALS — BP 118/60 | HR 68 | Temp 98.3°F | Resp 16 | Wt 104.4 lb

## 2018-03-03 DIAGNOSIS — S22000A Wedge compression fracture of unspecified thoracic vertebra, initial encounter for closed fracture: Secondary | ICD-10-CM | POA: Diagnosis not present

## 2018-03-03 DIAGNOSIS — S22080D Wedge compression fracture of T11-T12 vertebra, subsequent encounter for fracture with routine healing: Secondary | ICD-10-CM | POA: Diagnosis not present

## 2018-03-03 DIAGNOSIS — S32010D Wedge compression fracture of first lumbar vertebra, subsequent encounter for fracture with routine healing: Secondary | ICD-10-CM | POA: Diagnosis not present

## 2018-03-03 DIAGNOSIS — M8000XD Age-related osteoporosis with current pathological fracture, unspecified site, subsequent encounter for fracture with routine healing: Secondary | ICD-10-CM | POA: Diagnosis present

## 2018-03-03 DIAGNOSIS — M8000XK Age-related osteoporosis with current pathological fracture, unspecified site, subsequent encounter for fracture with nonunion: Secondary | ICD-10-CM | POA: Diagnosis not present

## 2018-03-03 DIAGNOSIS — Z1231 Encounter for screening mammogram for malignant neoplasm of breast: Secondary | ICD-10-CM | POA: Diagnosis not present

## 2018-03-03 DIAGNOSIS — M810 Age-related osteoporosis without current pathological fracture: Secondary | ICD-10-CM | POA: Diagnosis not present

## 2018-03-03 MED ORDER — HYDROCODONE-ACETAMINOPHEN 5-325 MG PO TABS: 1.0000 | ORAL_TABLET | Freq: Four times a day (QID) | ORAL | 0 refills | Status: DC | PRN

## 2018-03-03 NOTE — Progress Notes (Signed)
Patient: [name] [name] Female    DOB: 05/30/1946   72 y.o.   MRN: [id number] Visit Date: 03/03/2018  Today's Provider: Mar Daring, PA-C   Chief Complaint  Patient presents with  . Follow-up    xray results.   Subjective:    HPI Patient here today to discuss treatment options for compression Fracture. Xray showed Compression fracture changes stable at T4 and T6. Patient reports she had her mammogram and Bone Density imaging done today.  She also reports that with movement or walking she feels her bones are "crunching".     No Known Allergies   Current Outpatient Medications:  .  albuterol (PROVENTIL HFA;VENTOLIN HFA) 108 (90 Base) MCG/ACT inhaler, Inhale 2 puffs into the lungs every 4 (four) hours as needed for wheezing or shortness of breath. (Patient taking differently: Inhale 2 puffs into the lungs every 4 (four) hours as needed for wheezing or shortness of breath. ), Disp: 1 Inhaler, Rfl: 1 .  ALPRAZolam (XANAX) 0.5 MG tablet, TAKE 1 TABLET BY MOUTH TWICE DAILY AS NEEDED FOR ANXIETY, Disp: 60 tablet, Rfl: 5 .  aspirin 81 MG tablet, Take 81 mg by mouth daily., Disp: , Rfl:  .  citalopram (CELEXA) 20 MG tablet, Take 1 tablet (20 mg total) by mouth daily., Disp: 90 tablet, Rfl: 1 .  Docusate Sodium (COLACE PO), Take 100 mg by mouth daily as needed (constipation). , Disp: , Rfl:  .  furosemide (LASIX) 20 MG tablet, Take 20 mg by mouth daily as needed for fluid. , Disp: , Rfl:  .  HYDROcodone-acetaminophen (NORCO/VICODIN) 5-325 MG tablet, Take 1 tablet by mouth every 6 (six) hours as needed for moderate pain or severe pain., Disp: 90 tablet, Rfl: 0 .  ibuprofen (ADVIL,MOTRIN) 600 MG tablet, Take 600 mg by mouth every 8 (eight) hours as needed., Disp: , Rfl:  .  lisinopril (PRINIVIL,ZESTRIL) 40 MG tablet, Take 40 mg by mouth daily. , Disp: , Rfl:  .  metoprolol succinate (TOPROL-XL) 25 MG 24 hr tablet, Take 1 tablet (25 mg total) by mouth daily., Disp: 90 tablet, Rfl: 3 .   mometasone-formoterol (DULERA) 200-5 MCG/ACT AERO, Inhale 2 puffs into the lungs 2 (two) times daily., Disp: , Rfl:  .  pantoprazole (PROTONIX) 20 MG tablet, Take 20 mg by mouth daily as needed for heartburn. , Disp: , Rfl:  .  potassium chloride SA (K-DUR,KLOR-CON) 20 MEQ tablet, Take 1 tablet (20 mEq total) by mouth daily., Disp: 90 tablet, Rfl: 1 .  simvastatin (ZOCOR) 10 MG tablet, Take 1 tablet (10 mg total) by mouth daily., Disp: 90 tablet, Rfl: 3 .  Tiotropium Bromide Monohydrate (SPIRIVA RESPIMAT) 1.25 MCG/ACT AERS, Inhale 1 puff into the lungs daily., Disp: 4 g, Rfl: 5  Review of Systems  Constitutional: Negative.   Respiratory: Negative.   Cardiovascular: Negative.   Musculoskeletal: Positive for arthralgias, back pain, gait problem and myalgias.  Neurological: Positive for weakness. Negative for numbness and headaches.    Social History   Tobacco Use  . Smoking status: Current Every Day Smoker    Packs/day: 0.50    Years: 50.00    Pack years: 25.00  . Smokeless tobacco: Never Used  Substance Use Topics  . Alcohol use: Yes    Alcohol/week: 4.2 oz    Types: 7 Glasses of wine per week    Comment: 1-2 glasses wine daily   Objective:   BP 118/60 (BP Location: Left Arm, Patient Position: Sitting, Cuff  Size: Normal)   Pulse 68   Temp 98.3 F (36.8 C) (Oral)   Resp 16   Wt 104 lb 6.4 oz (47.4 kg)   SpO2 98%   BMI 18.49 kg/m  Vitals:   03/03/18 1306  BP: 118/60  Pulse: 68  Resp: 16  Temp: 98.3 F (36.8 C)  TempSrc: Oral  SpO2: 98%  Weight: 104 lb 6.4 oz (47.4 kg)     Physical Exam  Constitutional: She appears well-developed and well-nourished. No distress.  HENT:  Head: Normocephalic and atraumatic.  Neck: Normal range of motion. Neck supple.  Cardiovascular: Normal rate, regular rhythm and normal heart sounds.  Pulmonary/Chest: Effort normal. No respiratory distress.  Skin: She is not diaphoretic.  Psychiatric: She has a normal mood and affect. Her  behavior is normal. Judgment and thought content normal.  Vitals reviewed.      Assessment & Plan:     1. 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 Patient agreeable to endocrinology referral for consideration of injectable osteoporosis treatments. Discussed patient high risk for recurrent fractures. She reports doing well with PT currently, but that it causes worse pain afterwards. She is willing to continue at this time.  - Ambulatory referral to Endocrinology  2. Closed compression fracture of thoracolumbar vertebra with routine healing, subsequent encounter Hydrocodone-apap refilled as below for continued pain in thoracic spine.  - Ambulatory referral to Endocrinology - HYDROcodone-acetaminophen (NORCO/VICODIN) 5-325 MG tablet; Take 1 tablet by mouth every 6 (six) hours as needed for moderate pain or severe pain.  Dispense: 120 tablet; Refill: 0       Mar Daring, PA-C  [facility]

## 2018-03-04 ENCOUNTER — Other Ambulatory Visit: Payer: Self-pay

## 2018-03-04 DIAGNOSIS — Z86010 Personal history of colonic polyps: Secondary | ICD-10-CM

## 2018-03-06 ENCOUNTER — Telehealth: Payer: Self-pay

## 2018-03-06 NOTE — Telephone Encounter (Signed)
LMTCB  Thanks,  -Lula Michaux 

## 2018-03-06 NOTE — Telephone Encounter (Signed)
-----   Message from Mar Daring, PA-C sent at 03/06/2018 10:41 AM EDT ----- Normal mammogram. Repeat screening in one year.

## 2018-03-06 NOTE — Telephone Encounter (Signed)
Advised patient of results.  

## 2018-03-29 DIAGNOSIS — J449 Chronic obstructive pulmonary disease, unspecified: Secondary | ICD-10-CM | POA: Diagnosis not present

## 2018-04-03 ENCOUNTER — Telehealth: Payer: Self-pay | Admitting: Physician Assistant

## 2018-04-03 NOTE — Telephone Encounter (Signed)
Pt called saying she was in Aug 6th and Sonia Baller had talked to her about seeing and Endocrinologist.  She has not heard anything about an appt and wants to know if we have done a referral yet  CB# [phone]  Thanks Con Memos

## 2018-04-06 ENCOUNTER — Encounter: Payer: Self-pay | Admitting: *Deleted

## 2018-04-06 NOTE — Telephone Encounter (Signed)
Referral Notes  Number of Notes: 3  Type Date User Summary Attachment  General 03/27/2018 12:38 PM Parke Poisson - -  Note   LMTCB-PT to f/u on referral        Type Date User Summary Attachment  General 03/12/2018 2:46 PM Charlynn Court - -  Note   [facility] Endocrinology has made several attempts to contact pt for appt. Last attempt was 03/11/18. LVM        Type Date User Summary Attachment  General 03/04/2018 9:15 AM Parke Poisson - -  Note   Office note,bone density,demographics faxed to [facility] endocrinology.Pt advised that their office will contact her with appointment.Their contact information was given to pt         LM on voice mail with information.

## 2018-04-07 ENCOUNTER — Ambulatory Visit: Payer: Medicare Other | Admitting: Anesthesiology

## 2018-04-07 ENCOUNTER — Encounter: Payer: Self-pay | Admitting: *Deleted

## 2018-04-07 ENCOUNTER — Encounter
Admission: RE | Disposition: A | Discharge status: Home / Self Care | Payer: Self-pay | Source: Ambulatory Visit | Attending: Gastroenterology

## 2018-04-07 ENCOUNTER — Ambulatory Visit
Admission: RE | Admit: 2018-04-07 | Discharge: 2018-04-07 | Disposition: A | Discharge status: Home / Self Care | Payer: Medicare Other | Source: Ambulatory Visit | Attending: Gastroenterology | Admitting: Gastroenterology

## 2018-04-07 DIAGNOSIS — J449 Chronic obstructive pulmonary disease, unspecified: Secondary | ICD-10-CM | POA: Diagnosis not present

## 2018-04-07 DIAGNOSIS — I509 Heart failure, unspecified: Secondary | ICD-10-CM | POA: Insufficient documentation

## 2018-04-07 DIAGNOSIS — Z8711 Personal history of peptic ulcer disease: Secondary | ICD-10-CM | POA: Insufficient documentation

## 2018-04-07 DIAGNOSIS — Z79891 Long term (current) use of opiate analgesic: Secondary | ICD-10-CM | POA: Diagnosis not present

## 2018-04-07 DIAGNOSIS — Z1211 Encounter for screening for malignant neoplasm of colon: Secondary | ICD-10-CM | POA: Diagnosis not present

## 2018-04-07 DIAGNOSIS — F419 Anxiety disorder, unspecified: Secondary | ICD-10-CM | POA: Insufficient documentation

## 2018-04-07 DIAGNOSIS — I110 Hypertensive heart disease with heart failure: Secondary | ICD-10-CM | POA: Insufficient documentation

## 2018-04-07 DIAGNOSIS — Z7982 Long term (current) use of aspirin: Secondary | ICD-10-CM | POA: Insufficient documentation

## 2018-04-07 DIAGNOSIS — E7800 Pure hypercholesterolemia, unspecified: Secondary | ICD-10-CM | POA: Insufficient documentation

## 2018-04-07 DIAGNOSIS — Z09 Encounter for follow-up examination after completed treatment for conditions other than malignant neoplasm: Secondary | ICD-10-CM | POA: Diagnosis present

## 2018-04-07 DIAGNOSIS — Z79899 Other long term (current) drug therapy: Secondary | ICD-10-CM | POA: Diagnosis not present

## 2018-04-07 DIAGNOSIS — F329 Major depressive disorder, single episode, unspecified: Secondary | ICD-10-CM | POA: Diagnosis not present

## 2018-04-07 DIAGNOSIS — K219 Gastro-esophageal reflux disease without esophagitis: Secondary | ICD-10-CM | POA: Diagnosis not present

## 2018-04-07 DIAGNOSIS — K644 Residual hemorrhoidal skin tags: Secondary | ICD-10-CM | POA: Insufficient documentation

## 2018-04-07 DIAGNOSIS — K5730 Diverticulosis of large intestine without perforation or abscess without bleeding: Secondary | ICD-10-CM | POA: Insufficient documentation

## 2018-04-07 DIAGNOSIS — D122 Benign neoplasm of ascending colon: Secondary | ICD-10-CM | POA: Diagnosis not present

## 2018-04-07 DIAGNOSIS — Z860100 Personal history of colon polyps, unspecified: Secondary | ICD-10-CM

## 2018-04-07 DIAGNOSIS — D126 Benign neoplasm of colon, unspecified: Secondary | ICD-10-CM | POA: Diagnosis not present

## 2018-04-07 DIAGNOSIS — I2510 Atherosclerotic heart disease of native coronary artery without angina pectoris: Secondary | ICD-10-CM | POA: Diagnosis not present

## 2018-04-07 DIAGNOSIS — F17210 Nicotine dependence, cigarettes, uncomplicated: Secondary | ICD-10-CM | POA: Insufficient documentation

## 2018-04-07 DIAGNOSIS — I739 Peripheral vascular disease, unspecified: Secondary | ICD-10-CM | POA: Diagnosis not present

## 2018-04-07 DIAGNOSIS — Z791 Long term (current) use of non-steroidal anti-inflammatories (NSAID): Secondary | ICD-10-CM | POA: Insufficient documentation

## 2018-04-07 DIAGNOSIS — D123 Benign neoplasm of transverse colon: Secondary | ICD-10-CM | POA: Diagnosis not present

## 2018-04-07 DIAGNOSIS — D120 Benign neoplasm of cecum: Secondary | ICD-10-CM | POA: Diagnosis not present

## 2018-04-07 DIAGNOSIS — K635 Polyp of colon: Secondary | ICD-10-CM | POA: Insufficient documentation

## 2018-04-07 HISTORY — PX: COLONOSCOPY WITH PROPOFOL: SHX5780

## 2018-04-07 SURGERY — COLONOSCOPY WITH PROPOFOL
Anesthesia: General

## 2018-04-07 MED ORDER — ESMOLOL HCL 100 MG/10ML IV SOLN
INTRAVENOUS | Status: AC
  Filled 2018-04-07: qty 10

## 2018-04-07 MED ORDER — PROPOFOL 500 MG/50ML IV EMUL
INTRAVENOUS | Status: AC
  Filled 2018-04-07: qty 50

## 2018-04-07 MED ORDER — SODIUM CHLORIDE 0.9 % IV SOLN
INTRAVENOUS | Status: DC
  Administered 2018-04-07: 1000 mL via INTRAVENOUS

## 2018-04-07 MED ORDER — ESMOLOL HCL 100 MG/10ML IV SOLN
INTRAVENOUS | Status: DC | PRN
  Administered 2018-04-07 (×2): 20 mg via INTRAVENOUS

## 2018-04-07 MED ORDER — LIDOCAINE HCL (CARDIAC) PF 100 MG/5ML IV SOSY
PREFILLED_SYRINGE | INTRAVENOUS | Status: DC | PRN
  Administered 2018-04-07: 50 mg via INTRAVENOUS

## 2018-04-07 MED ORDER — PROPOFOL 500 MG/50ML IV EMUL
INTRAVENOUS | Status: DC | PRN
  Administered 2018-04-07: 75 ug/kg/min via INTRAVENOUS

## 2018-04-07 MED ORDER — PROPOFOL 10 MG/ML IV BOLUS
INTRAVENOUS | Status: DC | PRN
  Administered 2018-04-07: 10 mg via INTRAVENOUS
  Administered 2018-04-07: 20 mg via INTRAVENOUS

## 2018-04-07 MED ORDER — LIDOCAINE HCL (PF) 2 % IJ SOLN
INTRAMUSCULAR | Status: AC
  Filled 2018-04-07: qty 10

## 2018-04-07 NOTE — H&P (Signed)
[name], MD [facility] [address] [place], [place] [address] Phone:[phone] Fax : [phone]  Primary Care Physician:  Mar Daring, PA-C Primary Gastroenterologist:  Dr. Allen Norris  Pre-Procedure History & Physical: HPI:  [name] [name] is a 72 y.o. female is here for an colonoscopy.   Past Medical History:  Diagnosis Date  . Anxiety   . CAD (coronary artery disease) unk  . COPD (chronic obstructive pulmonary disease) ([place])   . Depression   . Hypercholesteremia unk  . Hypertension     Past Surgical History:  Procedure Laterality Date  . ABDOMINAL HYSTERECTOMY  1982  . BACK SURGERY    . KYPHOPLASTY N/A 10/23/2017   Procedure: ENIDPOEUMPN-T6;  Surgeon: Hessie Knows, MD;  Location: [facility] ORS;  Service: Orthopedics;  Laterality: N/A;    Prior to Admission medications   Medication Sig Start Date End Date Taking? Authorizing Provider  lisinopril (PRINIVIL,ZESTRIL) 40 MG tablet Take 40 mg by mouth daily.  11/24/13  Yes [provider]  metoprolol succinate (TOPROL-XL) 25 MG 24 hr tablet Take 1 tablet (25 mg total) by mouth daily. 02/04/18  Yes Mar Daring, PA-C  albuterol (PROVENTIL HFA;VENTOLIN HFA) 108 (90 Base) MCG/ACT inhaler Inhale 2 puffs into the lungs every 4 (four) hours as needed for wheezing or shortness of breath. Patient taking differently: Inhale 2 puffs into the lungs every 4 (four) hours as needed for wheezing or shortness of breath.  09/26/17   Mar Daring, PA-C  ALPRAZolam Duanne Moron) 0.5 MG tablet TAKE 1 TABLET BY MOUTH TWICE DAILY AS NEEDED FOR ANXIETY 01/12/18   Mar Daring, PA-C  aspirin 81 MG tablet Take 81 mg by mouth daily.    [provider]  citalopram (CELEXA) 20 MG tablet Take 1 tablet (20 mg total) by mouth daily. 01/08/18   Mar Daring, PA-C  Docusate Sodium (COLACE PO) Take 100 mg by mouth daily as needed (constipation).     [provider]  furosemide (LASIX) 20 MG tablet Take 20  mg by mouth daily as needed for fluid.     [provider]  HYDROcodone-acetaminophen (NORCO/VICODIN) 5-325 MG tablet Take 1 tablet by mouth every 6 (six) hours as needed for moderate pain or severe pain. 03/03/18   Mar Daring, PA-C  ibuprofen (ADVIL,MOTRIN) 600 MG tablet Take 600 mg by mouth every 8 (eight) hours as needed.    [provider]  mometasone-formoterol (DULERA) 200-5 MCG/ACT AERO Inhale 2 puffs into the lungs 2 (two) times daily.    [provider]  pantoprazole (PROTONIX) 20 MG tablet Take 20 mg by mouth daily as needed for heartburn.     [provider]  potassium chloride SA (K-DUR,KLOR-CON) 20 MEQ tablet Take 1 tablet (20 mEq total) by mouth daily. 10/01/17   Mar Daring, PA-C  simvastatin (ZOCOR) 10 MG tablet Take 1 tablet (10 mg total) by mouth daily. 03/24/17   Mar Daring, PA-C  Tiotropium Bromide Monohydrate (SPIRIVA RESPIMAT) 1.25 MCG/ACT AERS Inhale 1 puff into the lungs daily. 09/26/17   Mar Daring, PA-C    Allergies as of 03/04/2018  . (No Known Allergies)    Family History  Problem Relation Age of Onset  . Leukemia Mother   . Aneurysm Father   . Diabetes Father   . Heart disease Father   . Diabetes Maternal Grandmother   . Diabetes Paternal Grandmother     Social History   Socioeconomic History  . Marital status: Widowed  Spouse name: Not on file  . Number of children: Not on file  . Years of education: Not on file  . Highest education level: Not on file  Occupational History    Employer: RETIRED  Social Needs  . Financial resource strain: Not on file  . Food insecurity:    Worry: Not on file    Inability: Not on file  . Transportation needs:    Medical: Not on file    Non-medical: Not on file  Tobacco Use  . Smoking status: Current Every Day Smoker    Packs/day: 0.50    Years: 50.00    Pack years: 25.00  . Smokeless tobacco: Never Used  Substance and Sexual Activity  .  Alcohol use: Yes    Alcohol/week: 7.0 standard drinks    Types: 7 Glasses of wine per week    Comment: 1-2 glasses wine daily  . Drug use: No  . Sexual activity: Not on file  Lifestyle  . Physical activity:    Days per week: Not on file    Minutes per session: Not on file  . Stress: Not on file  Relationships  . Social connections:    Talks on phone: Not on file    Gets together: Not on file    Attends religious service: Not on file    Active member of club or organization: Not on file    Attends meetings of clubs or organizations: Not on file    Relationship status: Not on file  . Intimate partner violence:    Fear of current or ex partner: Not on file    Emotionally abused: Not on file    Physically abused: Not on file    Forced sexual activity: Not on file  Other Topics Concern  . Not on file  Social History Narrative  . Not on file    Review of Systems: See HPI, otherwise negative ROS  Physical Exam: BP (!) 160/95   Pulse 68   Temp 97.6 F (36.4 C) (Oral)   Resp 18   Ht 5\' 2"  (1.575 m)   Wt 45.4 kg   SpO2 98%   BMI 18.29 kg/m  General:   Alert,  pleasant and cooperative in NAD Head:  Normocephalic and atraumatic. Neck:  Supple; no masses or thyromegaly. Lungs:  Clear throughout to auscultation.    Heart:  Regular rate and rhythm. Abdomen:  Soft, nontender and nondistended. Normal bowel sounds, without guarding, and without rebound.   Neurologic:  Alert and  oriented x4;  grossly normal neurologically.  Impression/Plan: [name] [name] is here for an colonoscopy to be performed for history of colon polyps  Risks, benefits, limitations, and alternatives regarding  colonoscopy have been reviewed with the patient.  Questions have been answered.  All parties agreeable.   [name], MD  04/07/2018, 9:06 AM

## 2018-04-07 NOTE — Op Note (Signed)
[facility] Gastroenterology Patient Name: [name] [name] Procedure Date: 04/07/2018 9:13 AM MRN: [id number] Account #: [id number] Date of Birth: 04-06-1946 Admit Type: Outpatient Age: 72 Room: [facility] ENDO ROOM 4 Gender: Female Note Status: Finalized Procedure:            Colonoscopy Indications:          High risk colon cancer surveillance: Personal history                        of colonic polyps Providers:            [name] MD, MD Referring MD:         Mar Daring (Referring MD) Medicines:            Propofol per Anesthesia Complications:        No immediate complications. Procedure:            Pre-Anesthesia Assessment:                       - Prior to the procedure, a History and Physical was                        performed, and patient medications and allergies were                        reviewed. The patient's tolerance of previous                        anesthesia was also reviewed. The risks and benefits of                        the procedure and the sedation options and risks were                        discussed with the patient. All questions were                        answered, and informed consent was obtained. Prior                        Anticoagulants: The patient has taken no previous                        anticoagulant or antiplatelet agents. ASA Grade                        Assessment: II - A patient with mild systemic disease.                        After reviewing the risks and benefits, the patient was                        deemed in satisfactory condition to undergo the                        procedure.                       After obtaining informed consent, the colonoscope was  passed under direct vision. Throughout the procedure,                        the patient's blood pressure, pulse, and oxygen                        saturations were monitored continuously. The                        Colonoscope  was introduced through the anus and                        advanced to the the cecum, identified by appendiceal                        orifice and ileocecal valve. The colonoscopy was                        performed without difficulty. The patient tolerated the                        procedure well. The quality of the bowel preparation                        was good. Findings:      Two sessile polyps were found in the cecum. The polyps were 4 to 5 mm in       size. These polyps were removed with a cold snare. Resection and       retrieval were complete.      A 2 mm polyp was found in the cecum. The polyp was sessile. The polyp       was removed with a cold biopsy forceps. Resection and retrieval were       complete.      A 14 mm polyp was found in the ascending colon. The polyp was sessile.       Area was successfully injected with 3 mL saline with indigo carmine for       a lift polypectomy. The polyp was removed with a hot snare. Resection       and retrieval were complete. To prevent bleeding post-intervention,       three hemostatic clips were successfully placed (MR conditional). There       was no bleeding at the end of the procedure.      Five sessile polyps were found in the transverse colon. The polyps were       3 to 6 mm in size. These polyps were removed with a cold snare.       Resection and retrieval were complete.      A 8 mm polyp was found in the transverse colon. The polyp was sessile.       Area was successfully injected with 3 mL saline with indigo carmine for       a lift polypectomy. The polyp was removed with a hot snare. Resection       and retrieval were complete. To prevent bleeding post-intervention, two       hemostatic clips were successfully placed (MR conditional). There was no       bleeding at the end of the procedure.      Multiple small-mouthed diverticula were found in the sigmoid colon.  Skin tags were found on perianal exam. Impression:            - Two 4 to 5 mm polyps in the cecum, removed with a                        cold snare. Resected and retrieved.                       - One 2 mm polyp in the cecum, removed with a cold                        biopsy forceps. Resected and retrieved.                       - One 14 mm polyp in the ascending colon, removed with                        a hot snare. Resected and retrieved. Injected. Clips                        (MR conditional) were placed.                       - Five 3 to 6 mm polyps in the transverse colon,                        removed with a cold snare. Resected and retrieved.                       - One 8 mm polyp in the transverse colon, removed with                        a hot snare. Resected and retrieved. Injected. Clips                        (MR conditional) were placed.                       - Diverticulosis in the sigmoid colon.                       - Perianal skin tags found on perianal exam. Recommendation:       - Discharge patient to home.                       - Resume previous diet.                       - Continue present medications.                       - Await pathology results.                       - Repeat colonoscopy in 1 year for surveillance. Procedure Code(s):    --- Professional ---                       [phone], Colonoscopy, flexible; with removal of tumor(s),  polyp(s), or other lesion(s) by snare technique                       45380, 59, Colonoscopy, flexible; with biopsy, single                        or multiple                       45381, Colonoscopy, flexible; with directed submucosal                        injection(s), any substance Diagnosis Code(s):    --- Professional ---                       Z86.010, Personal history of colonic polyps                       D12.0, Benign neoplasm of cecum                       D12.3, Benign neoplasm of transverse colon (hepatic                        flexure or splenic  flexure)                       D12.2, Benign neoplasm of ascending colon CPT copyright 2017 American Medical Association. All rights reserved. The codes documented in this report are preliminary and upon coder review may  be revised to meet current compliance requirements. [name] MD, MD 04/07/2018 10:06:56 AM This report has been signed electronically. Number of Addenda: 0 Note Initiated On: 04/07/2018 9:13 AM Scope Withdrawal Time: 0 hours 33 minutes 35 seconds  Total Procedure Duration: 0 hours 43 minutes 17 seconds       [facility]

## 2018-04-07 NOTE — Anesthesia Preprocedure Evaluation (Signed)
Anesthesia Evaluation  Patient identified by MRN, date of birth, ID band Patient awake    Reviewed: Allergy & Precautions, H&P , NPO status , Patient's Chart, lab work & pertinent test results, reviewed documented beta blocker date and time   Airway Mallampati: II  TM Distance: >3 FB Neck ROM: full    Dental no notable dental hx. (+) Poor Dentition   Pulmonary neg pulmonary ROS, COPD, Current Smoker,    Pulmonary exam normal breath sounds clear to auscultation       Cardiovascular Exercise Tolerance: Good hypertension, + CAD, + Peripheral Vascular Disease and +CHF  negative cardio ROS Normal cardiovascular exam Rhythm:regular Rate:Normal     Neuro/Psych PSYCHIATRIC DISORDERS Anxiety Depression negative neurological ROS  negative psych ROS   GI/Hepatic negative GI ROS, Neg liver ROS, PUD, GERD  ,  Endo/Other  negative endocrine ROSdiabetes  Renal/GU negative Renal ROS  negative genitourinary   Musculoskeletal   Abdominal   Peds negative pediatric ROS (+)  Hematology negative hematology ROS (+)   Anesthesia Other Findings Past Medical History: No date: Anxiety unk: CAD (coronary artery disease) No date: COPD (chronic obstructive pulmonary disease) ([facility]) No date: Depression unk: Hypercholesteremia No date: Hypertension Past Surgical History: 1982: ABDOMINAL HYSTERECTOMY 10/23/2017: KYPHOPLASTY; N/A     Comment:  Procedure: KYPHOPLASTY-L1;  Surgeon: Hessie Knows, MD;               Location: [facility] ORS;  Service: Orthopedics;  Laterality:               N/A; BMI    Body Mass Index:  20.87 kg/m     Reproductive/Obstetrics negative OB ROS                             Anesthesia Physical  Anesthesia Plan  ASA: III  Anesthesia Plan: General   Post-op Pain Management:    Induction: Intravenous  PONV Risk Score and Plan: Propofol infusion  Airway Management Planned: Nasal  Cannula  Additional Equipment:   Intra-op Plan:   Post-operative Plan:   Informed Consent: I have reviewed the patients History and Physical, chart, labs and discussed the procedure including the risks, benefits and alternatives for the proposed anesthesia with the patient or authorized representative who has indicated his/her understanding and acceptance.   Dental Advisory Given  Plan Discussed with: CRNA  Anesthesia Plan Comments:         Anesthesia Quick Evaluation

## 2018-04-07 NOTE — Anesthesia Procedure Notes (Signed)
Performed by: Thomasenia Dowse, CRNA Pre-anesthesia Checklist: Patient identified, Emergency Drugs available, Suction available, Patient being monitored and Timeout performed Patient Re-evaluated:Patient Re-evaluated prior to induction Oxygen Delivery Method: Nasal cannula Induction Type: IV induction       

## 2018-04-07 NOTE — Transfer of Care (Signed)
Immediate Anesthesia Transfer of Care Note  Patient: [name] [name]  Procedure(s) Performed: COLONOSCOPY WITH PROPOFOL (N/A )  Patient Location: PACU  Anesthesia Type:General  Level of Consciousness: sedated and responds to stimulation  Airway & Oxygen Therapy: Patient Spontanous Breathing and Patient connected to nasal cannula oxygen  Post-op Assessment: Report given to RN and Post -op Vital signs reviewed and stable  Post vital signs: Reviewed and stable  Last Vitals:  Vitals Value Taken Time  BP 132/98 04/07/2018 10:09 AM  Temp    Pulse    Resp 15 04/07/2018 10:09 AM  SpO2 100 % 04/07/2018 10:09 AM    Last Pain:  Vitals:   04/07/18 0826  TempSrc: Oral  PainSc: 5          Complications: No apparent anesthesia complications

## 2018-04-07 NOTE — Anesthesia Post-op Follow-up Note (Signed)
Anesthesia QCDR form completed.        

## 2018-04-08 ENCOUNTER — Encounter: Payer: Self-pay | Admitting: Gastroenterology

## 2018-04-08 LAB — SURGICAL PATHOLOGY

## 2018-04-08 NOTE — Anesthesia Postprocedure Evaluation (Signed)
Anesthesia Post Note  Patient: [name] [name]  Procedure(s) Performed: COLONOSCOPY WITH PROPOFOL (N/A )  Patient location during evaluation: PACU Anesthesia Type: General Level of consciousness: awake and alert and oriented Pain management: pain level controlled Vital Signs Assessment: post-procedure vital signs reviewed and stable Respiratory status: spontaneous breathing Cardiovascular status: blood pressure returned to baseline Anesthetic complications: no     Last Vitals:  Vitals:   04/07/18 1030 04/07/18 1040  BP: (!) 177/85 (!) 153/99  Pulse: (!) 56 73  Resp: 14 15  Temp:    SpO2: 97%     Last Pain:  Vitals:   04/08/18 0754  TempSrc:   PainSc: 0-No pain                 Milany Geck

## 2018-04-09 ENCOUNTER — Encounter: Payer: Self-pay | Admitting: Gastroenterology

## 2018-04-15 DIAGNOSIS — K219 Gastro-esophageal reflux disease without esophagitis: Secondary | ICD-10-CM | POA: Diagnosis not present

## 2018-04-15 DIAGNOSIS — M810 Age-related osteoporosis without current pathological fracture: Secondary | ICD-10-CM | POA: Diagnosis not present

## 2018-04-16 DIAGNOSIS — M810 Age-related osteoporosis without current pathological fracture: Secondary | ICD-10-CM | POA: Diagnosis not present

## 2018-04-20 ENCOUNTER — Other Ambulatory Visit: Payer: Self-pay | Admitting: Physician Assistant

## 2018-04-20 DIAGNOSIS — S22000A Wedge compression fracture of unspecified thoracic vertebra, initial encounter for closed fracture: Secondary | ICD-10-CM

## 2018-04-20 MED ORDER — HYDROCODONE-ACETAMINOPHEN 5-325 MG PO TABS: 1.0000 | ORAL_TABLET | Freq: Three times a day (TID) | ORAL | 0 refills | Status: DC | PRN

## 2018-04-20 NOTE — Telephone Encounter (Signed)
Pt needing a refill on: °HYDROcodone-acetaminophen (NORCO/VICODIN) 5-325 MG tablet ° °Please fill at: ° °[id number] - [place], [place] - [address] [phone] (Phone) °[phone] (Fax)  ° °Thanks, °[facility] °

## 2018-04-28 DIAGNOSIS — J449 Chronic obstructive pulmonary disease, unspecified: Secondary | ICD-10-CM | POA: Diagnosis not present

## 2018-05-20 ENCOUNTER — Other Ambulatory Visit: Payer: Self-pay | Admitting: Physician Assistant

## 2018-05-20 DIAGNOSIS — S22000A Wedge compression fracture of unspecified thoracic vertebra, initial encounter for closed fracture: Secondary | ICD-10-CM

## 2018-05-20 NOTE — Telephone Encounter (Signed)
Pt needing a script for HYDROcodone-acetaminophen (NORCO/VICODIN) 5-325 MG tablet.  Please call pt when ready for pick up.  Pt's daughter, Lossie Faes will be the one coming by to pick up the script.  Thanks, [employer]

## 2018-05-20 NOTE — Telephone Encounter (Signed)
We usually just send this to her pharmacy. Does she have to have it printed?

## 2018-05-21 ENCOUNTER — Telehealth: Payer: Self-pay | Admitting: Physician Assistant

## 2018-05-21 NOTE — Telephone Encounter (Signed)
LMTCB 05/21/2018  Thanks,   -Mickel Baas

## 2018-05-21 NOTE — Telephone Encounter (Signed)
Pt returning missed call.  Please call pt back if needed. ° °Thanks, °[facility] °

## 2018-05-22 ENCOUNTER — Other Ambulatory Visit: Payer: Self-pay

## 2018-05-22 DIAGNOSIS — I10 Essential (primary) hypertension: Secondary | ICD-10-CM

## 2018-05-22 MED ORDER — METOPROLOL SUCCINATE ER 25 MG PO TB24: 25.0000 mg | ORAL_TABLET | Freq: Every day | ORAL | 0 refills | Status: DC

## 2018-05-22 MED ORDER — LISINOPRIL 40 MG PO TABS: 40.0000 mg | ORAL_TABLET | Freq: Every day | ORAL | 0 refills | Status: DC

## 2018-05-22 MED ORDER — HYDROCODONE-ACETAMINOPHEN 5-325 MG PO TABS: 1.0000 | ORAL_TABLET | Freq: Three times a day (TID) | ORAL | 0 refills | Status: DC | PRN

## 2018-05-22 NOTE — Telephone Encounter (Signed)
Pt returned call. Pt stated that she forgot Rx could be sent to pharmacy and request that Rx for HYDROcodone-acetaminophen (NORCO/VICODIN) 5-325 MG tablet be sent today to [name] [name] since she is going out of town. Please advise. Thanks TNP

## 2018-05-22 NOTE — Telephone Encounter (Signed)
Patient calling saying that she is out of medication. She normally sees [place]. Please review. Thanks!

## 2018-05-22 NOTE — Telephone Encounter (Signed)
Please see other telephone encounter.

## 2018-05-27 ENCOUNTER — Ambulatory Visit (INDEPENDENT_AMBULATORY_CARE_PROVIDER_SITE_OTHER): Payer: Medicare Other

## 2018-05-27 DIAGNOSIS — Z23 Encounter for immunization: Secondary | ICD-10-CM

## 2018-05-29 DIAGNOSIS — J449 Chronic obstructive pulmonary disease, unspecified: Secondary | ICD-10-CM | POA: Diagnosis not present

## 2018-06-02 NOTE — Progress Notes (Signed)
Patient: [name] [name] Female    DOB: 06-13-1946   72 y.o.   MRN: [id number] Visit Date: 06/03/2018  Today's Provider: Mar Daring, PA-C   Chief Complaint  Patient presents with  . Follow-up   Subjective:    HPI Follow-up Patient presents today for follow-up from her endocrinology appointment on 04/15/2018. Patient states she has not heard anything from the endocrinology office.  Per review of the OV from Endocrine she was offered Forteo or Reclast. It seems that she may have mentioned needing dental work and that initiation of medication was postponed. Per patient she does not have anything scheduled and it was a misunderstanding.     No Known Allergies   Current Outpatient Medications:  .  albuterol (PROVENTIL HFA;VENTOLIN HFA) 108 (90 Base) MCG/ACT inhaler, Inhale 2 puffs into the lungs every 4 (four) hours as needed for wheezing or shortness of breath. (Patient taking differently: Inhale 2 puffs into the lungs every 4 (four) hours as needed for wheezing or shortness of breath. ), Disp: 1 Inhaler, Rfl: 1 .  ALPRAZolam (XANAX) 0.5 MG tablet, TAKE 1 TABLET BY MOUTH TWICE DAILY AS NEEDED FOR ANXIETY, Disp: 60 tablet, Rfl: 5 .  aspirin 81 MG tablet, Take 81 mg by mouth daily., Disp: , Rfl:  .  citalopram (CELEXA) 20 MG tablet, Take 1 tablet (20 mg total) by mouth daily., Disp: 90 tablet, Rfl: 1 .  Docusate Sodium (COLACE PO), Take 100 mg by mouth daily as needed (constipation). , Disp: , Rfl:  .  furosemide (LASIX) 20 MG tablet, Take 20 mg by mouth daily as needed for fluid. , Disp: , Rfl:  .  HYDROcodone-acetaminophen (NORCO/VICODIN) 5-325 MG tablet, Take 1 tablet by mouth every 8 (eight) hours as needed for moderate pain or severe pain., Disp: 90 tablet, Rfl: 0 .  ibuprofen (ADVIL,MOTRIN) 600 MG tablet, Take 600 mg by mouth every 8 (eight) hours as needed., Disp: , Rfl:  .  lisinopril (PRINIVIL,ZESTRIL) 40 MG tablet, Take 1 tablet (40 mg total) by mouth daily., Disp: 90  tablet, Rfl: 0 .  metoprolol succinate (TOPROL-XL) 25 MG 24 hr tablet, Take 1 tablet (25 mg total) by mouth daily., Disp: 90 tablet, Rfl: 0 .  mometasone-formoterol (DULERA) 200-5 MCG/ACT AERO, Inhale 2 puffs into the lungs 2 (two) times daily., Disp: , Rfl:  .  pantoprazole (PROTONIX) 20 MG tablet, Take 20 mg by mouth daily as needed for heartburn. , Disp: , Rfl:  .  potassium chloride SA (K-DUR,KLOR-CON) 20 MEQ tablet, Take 1 tablet (20 mEq total) by mouth daily., Disp: 90 tablet, Rfl: 1 .  simvastatin (ZOCOR) 10 MG tablet, TAKE 1 TABLET(10 MG) BY MOUTH DAILY, Disp: 90 tablet, Rfl: 0 .  Tiotropium Bromide Monohydrate (SPIRIVA RESPIMAT) 1.25 MCG/ACT AERS, Inhale 1 puff into the lungs daily., Disp: 4 g, Rfl: 5  Review of Systems  Constitutional: Negative.   HENT: Negative.   Respiratory: Negative.   Cardiovascular: Negative.   Gastrointestinal: Negative.   Musculoskeletal: Positive for back pain and myalgias.  Allergic/Immunologic: Negative.     Social History   Tobacco Use  . Smoking status: Current Every Day Smoker    Packs/day: 0.50    Years: 50.00    Pack years: 25.00  . Smokeless tobacco: Never Used  Substance Use Topics  . Alcohol use: Yes    Alcohol/week: 7.0 standard drinks    Types: 7 Glasses of wine per week    Comment: 1-2 glasses wine  daily   Objective:   BP (!) 96/52 (BP Location: Left Arm, Patient Position: Sitting, Cuff Size: Normal)   Pulse 67   Temp 97.6 F (36.4 C) (Oral)   Wt 98 lb (44.5 kg)   SpO2 97%   BMI 17.92 kg/m  Vitals:   06/03/18 1114  BP: (!) 96/52  Pulse: 67  Temp: 97.6 F (36.4 C)  TempSrc: Oral  SpO2: 97%  Weight: 98 lb (44.5 kg)     Physical Exam  Constitutional: She appears well-developed and well-nourished.  HENT:  Head: Normocephalic and atraumatic.  Eyes: EOM are normal.  Neck: Normal range of motion. Neck supple.  Pulmonary/Chest: Effort normal. No respiratory distress.  Psychiatric: She has a normal mood and affect.  Her behavior is normal. Judgment and thought content normal.  Vitals reviewed.      Assessment & Plan:     1. Age-related osteoporosis with current pathological fracture with routine healing, subsequent encounter Discussed Endocrinology note. Patient admits she will NOT be able to due injection daily thus does not want forteo. She is interested in Reclast. She was advised to call the endocrinology office to correct the misunderstanding and see if Reclast can be initiated.   2. Neuropathic pain Will restart gabapentin for nerve pain associated with chronic back pain. Has tolerated well in the past.  - gabapentin (NEURONTIN) 300 MG capsule; Take 1 capsule (300 mg total) by mouth at bedtime.  Dispense: 90 capsule; Refill: [place], PA-C  [facility]

## 2018-06-03 ENCOUNTER — Encounter: Payer: Self-pay | Admitting: Physician Assistant

## 2018-06-03 ENCOUNTER — Ambulatory Visit: Payer: Medicare Other | Admitting: Physician Assistant

## 2018-06-03 VITALS — BP 96/52 | HR 67 | Temp 97.6°F | Wt 98.0 lb

## 2018-06-03 DIAGNOSIS — M8000XD Age-related osteoporosis with current pathological fracture, unspecified site, subsequent encounter for fracture with routine healing: Secondary | ICD-10-CM

## 2018-06-03 DIAGNOSIS — M792 Neuralgia and neuritis, unspecified: Secondary | ICD-10-CM | POA: Diagnosis not present

## 2018-06-03 MED ORDER — GABAPENTIN 300 MG PO CAPS: 300.0000 mg | ORAL_CAPSULE | Freq: Every day | ORAL | 1 refills | Status: DC

## 2018-06-19 ENCOUNTER — Other Ambulatory Visit: Payer: Self-pay | Admitting: Physician Assistant

## 2018-06-19 DIAGNOSIS — S22000A Wedge compression fracture of unspecified thoracic vertebra, initial encounter for closed fracture: Secondary | ICD-10-CM

## 2018-06-19 MED ORDER — HYDROCODONE-ACETAMINOPHEN 5-325 MG PO TABS: 1.0000 | ORAL_TABLET | Freq: Three times a day (TID) | ORAL | 0 refills | Status: DC | PRN

## 2018-06-19 NOTE — Telephone Encounter (Signed)
Pt needing a refill on:  HYDROcodone-acetaminophen (NORCO/VICODIN) 5-325 MG tablet  - pt is out   Please fill at: [facility] [address] [place], [place] [phone] (Phone) [phone] (Fax)    Thanks Roman Forest

## 2018-06-28 DIAGNOSIS — J449 Chronic obstructive pulmonary disease, unspecified: Secondary | ICD-10-CM | POA: Diagnosis not present

## 2018-07-16 ENCOUNTER — Telehealth: Payer: Self-pay | Admitting: Physician Assistant

## 2018-07-16 DIAGNOSIS — F411 Generalized anxiety disorder: Secondary | ICD-10-CM

## 2018-07-16 DIAGNOSIS — S22000A Wedge compression fracture of unspecified thoracic vertebra, initial encounter for closed fracture: Secondary | ICD-10-CM

## 2018-07-16 NOTE — Telephone Encounter (Signed)
Pt is going out of town and wants to pick up her refills on Friday.  HYDROcodone-acetaminophen (NORCO/VICODIN) 5-325 MG tablet ALPRAZolam (XANAX) 0.5 MG tablet  Please fill at: [id number] [address], [place] [address] [phone] (Phone) [phone] (Fax)   Thanks, [employer]

## 2018-07-17 ENCOUNTER — Telehealth: Payer: Self-pay

## 2018-07-17 MED ORDER — HYDROCODONE-ACETAMINOPHEN 5-325 MG PO TABS: 1.0000 | ORAL_TABLET | Freq: Three times a day (TID) | ORAL | 0 refills | Status: DC | PRN

## 2018-07-17 MED ORDER — ALPRAZOLAM 0.5 MG PO TABS: 0.5000 mg | ORAL_TABLET | Freq: Two times a day (BID) | ORAL | 5 refills | Status: DC | PRN

## 2018-07-17 NOTE — Telephone Encounter (Signed)
Please review. KW 

## 2018-07-17 NOTE — Telephone Encounter (Signed)
[id number]

## 2018-07-17 NOTE — Telephone Encounter (Signed)
Patient advised that medications were sent to pharmacy

## 2018-07-17 NOTE — Telephone Encounter (Signed)
Refilled for her.  [place] reviewed.

## 2018-07-19 ENCOUNTER — Other Ambulatory Visit: Payer: Self-pay | Admitting: Physician Assistant

## 2018-07-19 DIAGNOSIS — F411 Generalized anxiety disorder: Secondary | ICD-10-CM

## 2018-07-29 DIAGNOSIS — J449 Chronic obstructive pulmonary disease, unspecified: Secondary | ICD-10-CM | POA: Diagnosis not present

## 2018-08-14 ENCOUNTER — Other Ambulatory Visit: Payer: Self-pay

## 2018-08-14 DIAGNOSIS — S22000A Wedge compression fracture of unspecified thoracic vertebra, initial encounter for closed fracture: Secondary | ICD-10-CM

## 2018-08-14 DIAGNOSIS — F411 Generalized anxiety disorder: Secondary | ICD-10-CM

## 2018-08-14 MED ORDER — HYDROCODONE-ACETAMINOPHEN 5-325 MG PO TABS: 1.0000 | ORAL_TABLET | Freq: Three times a day (TID) | ORAL | 0 refills | Status: DC | PRN

## 2018-08-14 NOTE — Telephone Encounter (Signed)
Patient calling office requesting refill on Hydrocodone and Alprazolam sent to [facility] [address]. Patient requesting this to be filled today so she can pick up at pharmacy. Patient request for CMA to give her a call back once prescription has been filled. KW

## 2018-08-20 ENCOUNTER — Other Ambulatory Visit: Payer: Self-pay | Admitting: Family Medicine

## 2018-08-20 ENCOUNTER — Other Ambulatory Visit: Payer: Self-pay | Admitting: Physician Assistant

## 2018-08-20 DIAGNOSIS — E7800 Pure hypercholesterolemia, unspecified: Secondary | ICD-10-CM

## 2018-08-20 DIAGNOSIS — F411 Generalized anxiety disorder: Secondary | ICD-10-CM

## 2018-08-20 NOTE — Telephone Encounter (Signed)
See Rx request ° °

## 2018-08-29 DIAGNOSIS — J449 Chronic obstructive pulmonary disease, unspecified: Secondary | ICD-10-CM | POA: Diagnosis not present

## 2018-09-09 DIAGNOSIS — H5213 Myopia, bilateral: Secondary | ICD-10-CM | POA: Diagnosis not present

## 2018-09-14 ENCOUNTER — Other Ambulatory Visit: Payer: Self-pay | Admitting: Physician Assistant

## 2018-09-14 DIAGNOSIS — S22000A Wedge compression fracture of unspecified thoracic vertebra, initial encounter for closed fracture: Secondary | ICD-10-CM

## 2018-09-14 MED ORDER — HYDROCODONE-ACETAMINOPHEN 5-325 MG PO TABS: 1.0000 | ORAL_TABLET | Freq: Four times a day (QID) | ORAL | 0 refills | Status: DC | PRN

## 2018-09-14 NOTE — Telephone Encounter (Signed)
Pt needing a refill on:  HYDROcodone-acetaminophen (NORCO/VICODIN) 5-325 MG tablet  - pt is out and needing a refill today if possible.  She is also asking for a refill on the previous amount because this amount is only taking the edge off.  Still in severe pain.  Please fill at: [facility] [place], [place] [phone] (Phone) [phone] (Fax)   Thanks, [employer]

## 2018-09-14 NOTE — Telephone Encounter (Signed)
L.O.V. was 06/04/2019, please advise.

## 2018-09-27 DIAGNOSIS — J449 Chronic obstructive pulmonary disease, unspecified: Secondary | ICD-10-CM | POA: Diagnosis not present

## 2018-10-01 DIAGNOSIS — H25812 Combined forms of age-related cataract, left eye: Secondary | ICD-10-CM | POA: Diagnosis not present

## 2018-10-06 ENCOUNTER — Telehealth: Payer: Self-pay | Admitting: Physician Assistant

## 2018-10-06 NOTE — Telephone Encounter (Signed)
Pt's Daughter will be out of town and will not be able to bring pt to the appts on March 20.  Please call Daughter Wells Guiles back to reschedule at [phone].  Thanks, [employer]

## 2018-10-12 ENCOUNTER — Other Ambulatory Visit: Payer: Self-pay | Admitting: Family Medicine

## 2018-10-12 DIAGNOSIS — S22000A Wedge compression fracture of unspecified thoracic vertebra, initial encounter for closed fracture: Secondary | ICD-10-CM

## 2018-10-12 MED ORDER — HYDROCODONE-ACETAMINOPHEN 5-325 MG PO TABS: 1.0000 | ORAL_TABLET | Freq: Four times a day (QID) | ORAL | 0 refills | Status: DC | PRN

## 2018-10-12 NOTE — Telephone Encounter (Signed)
AWV is scheduled for 11/13/18 @ 1:40 PM.

## 2018-10-12 NOTE — Telephone Encounter (Signed)
Patient needs refill on Hydrocodone 5-325 mg sent to [facility] in [place] .

## 2018-10-12 NOTE — Telephone Encounter (Signed)
LMTCB on number requested to call when daughter called in.  -MM

## 2018-10-16 ENCOUNTER — Ambulatory Visit: Payer: Medicare Other

## 2018-10-16 ENCOUNTER — Encounter: Payer: Medicare Other | Admitting: Physician Assistant

## 2018-10-24 ENCOUNTER — Encounter: Payer: Self-pay | Admitting: Physician Assistant

## 2018-10-28 DIAGNOSIS — J449 Chronic obstructive pulmonary disease, unspecified: Secondary | ICD-10-CM | POA: Diagnosis not present

## 2018-11-13 ENCOUNTER — Encounter: Payer: Medicare Other | Admitting: Physician Assistant

## 2018-11-13 ENCOUNTER — Ambulatory Visit: Payer: Medicare Other

## 2018-11-17 ENCOUNTER — Other Ambulatory Visit: Payer: Self-pay | Admitting: Physician Assistant

## 2018-11-17 ENCOUNTER — Other Ambulatory Visit: Payer: Self-pay | Admitting: Family Medicine

## 2018-11-17 DIAGNOSIS — S22000A Wedge compression fracture of unspecified thoracic vertebra, initial encounter for closed fracture: Secondary | ICD-10-CM

## 2018-11-17 DIAGNOSIS — I10 Essential (primary) hypertension: Secondary | ICD-10-CM

## 2018-11-17 MED ORDER — LISINOPRIL 40 MG PO TABS: 40.0000 mg | ORAL_TABLET | Freq: Every day | ORAL | 0 refills | Status: DC

## 2018-11-17 NOTE — Telephone Encounter (Signed)
Please review. Thanks!  

## 2018-11-17 NOTE — Telephone Encounter (Signed)
We received a fax from after hours service stating she needs a refill on the following medication  HYDROcodone-acetaminophen (NORCO/VICODIN) 5-325 MG tablet

## 2018-11-23 ENCOUNTER — Other Ambulatory Visit: Payer: Self-pay | Admitting: Physician Assistant

## 2018-11-23 ENCOUNTER — Telehealth: Payer: Self-pay | Admitting: Physician Assistant

## 2018-11-23 DIAGNOSIS — S22000A Wedge compression fracture of unspecified thoracic vertebra, initial encounter for closed fracture: Secondary | ICD-10-CM

## 2018-11-23 MED ORDER — HYDROCODONE-ACETAMINOPHEN 5-325 MG PO TABS: 1.0000 | ORAL_TABLET | Freq: Four times a day (QID) | ORAL | 0 refills | Status: DC | PRN

## 2018-11-23 NOTE — Telephone Encounter (Signed)
L.O.V. 06/03/2018, please advise.

## 2018-11-23 NOTE — Telephone Encounter (Signed)
Pt needing a refill on: HYDROcodone-acetaminophen (NORCO/VICODIN) 5-325 MG tablet  Please fill at:  [facility] [place], [place] [phone] (Phone) [phone] (Fax)   Thanks, [employer]

## 2018-11-27 DIAGNOSIS — J449 Chronic obstructive pulmonary disease, unspecified: Secondary | ICD-10-CM | POA: Diagnosis not present

## 2018-12-14 ENCOUNTER — Ambulatory Visit: Payer: Medicare Other | Admitting: Urology

## 2018-12-23 ENCOUNTER — Other Ambulatory Visit: Payer: Self-pay

## 2018-12-23 DIAGNOSIS — S22000A Wedge compression fracture of unspecified thoracic vertebra, initial encounter for closed fracture: Secondary | ICD-10-CM

## 2018-12-23 MED ORDER — HYDROCODONE-ACETAMINOPHEN 5-325 MG PO TABS: 1.0000 | ORAL_TABLET | Freq: Four times a day (QID) | ORAL | 0 refills | Status: DC | PRN

## 2018-12-24 ENCOUNTER — Encounter: Payer: Self-pay | Admitting: Physician Assistant

## 2018-12-28 DIAGNOSIS — J449 Chronic obstructive pulmonary disease, unspecified: Secondary | ICD-10-CM | POA: Diagnosis not present

## 2019-01-05 ENCOUNTER — Other Ambulatory Visit: Payer: Self-pay | Admitting: Physician Assistant

## 2019-01-05 DIAGNOSIS — F411 Generalized anxiety disorder: Secondary | ICD-10-CM

## 2019-01-20 ENCOUNTER — Encounter: Payer: Self-pay | Admitting: Physician Assistant

## 2019-01-25 ENCOUNTER — Telehealth: Payer: Self-pay | Admitting: Urology

## 2019-01-25 DIAGNOSIS — N281 Cyst of kidney, acquired: Secondary | ICD-10-CM

## 2019-01-25 NOTE — Telephone Encounter (Signed)
The order for her RUS has expired can you put a new order in please. Her follow up is in a few weeks.  Thanks, [name]

## 2019-01-26 ENCOUNTER — Ambulatory Visit (INDEPENDENT_AMBULATORY_CARE_PROVIDER_SITE_OTHER): Payer: Medicare Other

## 2019-01-26 ENCOUNTER — Other Ambulatory Visit: Payer: Self-pay

## 2019-01-26 DIAGNOSIS — Z0000 Encounter for general adult medical examination without abnormal findings: Secondary | ICD-10-CM

## 2019-01-26 DIAGNOSIS — S22000A Wedge compression fracture of unspecified thoracic vertebra, initial encounter for closed fracture: Secondary | ICD-10-CM

## 2019-01-26 MED ORDER — HYDROCODONE-ACETAMINOPHEN 5-325 MG PO TABS: 1.0000 | ORAL_TABLET | Freq: Four times a day (QID) | ORAL | 0 refills | Status: DC | PRN

## 2019-01-26 NOTE — Patient Instructions (Signed)
Ms. [name] [name] , Thank you for taking time to come for your Medicare Wellness Visit. I appreciate your ongoing commitment to your health goals. Please review the following plan we discussed and let me know if I can assist you in the future.   Screening recommendations/referrals: Colonoscopy: Up to date, due 03/2019 Mammogram: Up to date, due 02/2020 Bone Density: Up to date, due 02/2020 Recommended yearly ophthalmology/optometry visit for glaucoma screening and checkup Recommended yearly dental visit for hygiene and checkup  Vaccinations: Influenza vaccine: Up to date Pneumococcal vaccine: Completed series Tdap vaccine: Up to date, due 02/2021 Shingles vaccine: Pt declines today.     Advanced directives: Advance directive discussed with you today. Even though you declined this today please call our office should you change your mind and we can give you the proper paperwork for you to fill out.  Conditions/risks identified: Smoking cessation discussed with you today.   Next appointment: 02/05/19 @ 3:00 PM with Fenton Malling.    Preventive Care 72 Years and Older, Female Preventive care refers to lifestyle choices and visits with your health care provider that can promote health and wellness. What does preventive care include?  A yearly physical exam. This is also called an annual well check.  Dental exams once or twice a year.  Routine eye exams. Ask your health care provider how often you should have your eyes checked.  Personal lifestyle choices, including:  Daily care of your teeth and gums.  Regular physical activity.  Eating a healthy diet.  Avoiding tobacco and drug use.  Limiting alcohol use.  Practicing safe sex.  Taking low-dose aspirin every day.  Taking vitamin and mineral supplements as recommended by your health care provider. What happens during an annual well check? The services and screenings done by your health care provider during your annual well  check will depend on your age, overall health, lifestyle risk factors, and family history of disease. Counseling  Your health care provider may ask you questions about your:  Alcohol use.  Tobacco use.  Drug use.  Emotional well-being.  Home and relationship well-being.  Sexual activity.  Eating habits.  History of falls.  Memory and ability to understand (cognition).  Work and work Statistician.  Reproductive health. Screening  You may have the following tests or measurements:  Height, weight, and BMI.  Blood pressure.  Lipid and cholesterol levels. These may be checked every 5 years, or more frequently if you are over 32 years old.  Skin check.  Lung cancer screening. You may have this screening every year starting at age 23 if you have a 30-pack-year history of smoking and currently smoke or have quit within the past 15 years.  Fecal occult blood test (FOBT) of the stool. You may have this test every year starting at age 69.  Flexible sigmoidoscopy or colonoscopy. You may have a sigmoidoscopy every 5 years or a colonoscopy every 10 years starting at age 70.  Hepatitis C blood test.  Hepatitis B blood test.  Sexually transmitted disease (STD) testing.  Diabetes screening. This is done by checking your blood sugar (glucose) after you have not eaten for a while (fasting). You may have this done every 1-3 years.  Bone density scan. This is done to screen for osteoporosis. You may have this done starting at age 54.  Mammogram. This may be done every 1-2 years. Talk to your health care provider about how often you should have regular mammograms. Talk with your health care provider about  your test results, treatment options, and if necessary, the need for more tests. Vaccines  Your health care provider may recommend certain vaccines, such as:  Influenza vaccine. This is recommended every year.  Tetanus, diphtheria, and acellular pertussis (Tdap, Td) vaccine. You  may need a Td booster every 10 years.  Zoster vaccine. You may need this after age 59.  Pneumococcal 13-valent conjugate (PCV13) vaccine. One dose is recommended after age 66.  Pneumococcal polysaccharide (PPSV23) vaccine. One dose is recommended after age 32. Talk to your health care provider about which screenings and vaccines you need and how often you need them. This information is not intended to replace advice given to you by your health care provider. Make sure you discuss any questions you have with your health care provider. Document Released: 08/11/2015 Document Revised: 04/03/2016 Document Reviewed: 05/16/2015 Elsevier Interactive Patient Education  2017 [place] Prevention in the Home Falls can cause injuries. They can happen to people of all ages. There are many things you can do to make your home safe and to help prevent falls. What can I do on the outside of my home?  Regularly fix the edges of walkways and driveways and fix any cracks.  Remove anything that might make you trip as you walk through a door, such as a raised step or threshold.  Trim any bushes or trees on the path to your home.  Use bright outdoor lighting.  Clear any walking paths of anything that might make someone trip, such as rocks or tools.  Regularly check to see if handrails are loose or broken. Make sure that both sides of any steps have handrails.  Any raised decks and porches should have guardrails on the edges.  Have any leaves, snow, or ice cleared regularly.  Use sand or salt on walking paths during winter.  Clean up any spills in your garage right away. This includes oil or grease spills. What can I do in the bathroom?  Use night lights.  Install grab bars by the toilet and in the tub and shower. Do not use towel bars as grab bars.  Use non-skid mats or decals in the tub or shower.  If you need to sit down in the shower, use a plastic, non-slip stool.  Keep the floor  dry. Clean up any water that spills on the floor as soon as it happens.  Remove soap buildup in the tub or shower regularly.  Attach bath mats securely with double-sided non-slip rug tape.  Do not have throw rugs and other things on the floor that can make you trip. What can I do in the bedroom?  Use night lights.  Make sure that you have a light by your bed that is easy to reach.  Do not use any sheets or blankets that are too big for your bed. They should not hang down onto the floor.  Have a firm chair that has side arms. You can use this for support while you get dressed.  Do not have throw rugs and other things on the floor that can make you trip. What can I do in the kitchen?  Clean up any spills right away.  Avoid walking on wet floors.  Keep items that you use a lot in easy-to-reach places.  If you need to reach something above you, use a strong step stool that has a grab bar.  Keep electrical cords out of the way.  Do not use floor polish or wax  that makes floors slippery. If you must use wax, use non-skid floor wax.  Do not have throw rugs and other things on the floor that can make you trip. What can I do with my stairs?  Do not leave any items on the stairs.  Make sure that there are handrails on both sides of the stairs and use them. Fix handrails that are broken or loose. Make sure that handrails are as long as the stairways.  Check any carpeting to make sure that it is firmly attached to the stairs. Fix any carpet that is loose or worn.  Avoid having throw rugs at the top or bottom of the stairs. If you do have throw rugs, attach them to the floor with carpet tape.  Make sure that you have a light switch at the top of the stairs and the bottom of the stairs. If you do not have them, ask someone to add them for you. What else can I do to help prevent falls?  Wear shoes that:  Do not have high heels.  Have rubber bottoms.  Are comfortable and fit you  well.  Are closed at the toe. Do not wear sandals.  If you use a stepladder:  Make sure that it is fully opened. Do not climb a closed stepladder.  Make sure that both sides of the stepladder are locked into place.  Ask someone to hold it for you, if possible.  Clearly mark and make sure that you can see:  Any grab bars or handrails.  First and last steps.  Where the edge of each step is.  Use tools that help you move around (mobility aids) if they are needed. These include:  Canes.  Walkers.  Scooters.  Crutches.  Turn on the lights when you go into a dark area. Replace any light bulbs as soon as they burn out.  Set up your furniture so you have a clear path. Avoid moving your furniture around.  If any of your floors are uneven, fix them.  If there are any pets around you, be aware of where they are.  Review your medicines with your doctor. Some medicines can make you feel dizzy. This can increase your chance of falling. Ask your doctor what other things that you can do to help prevent falls. This information is not intended to replace advice given to you by your health care provider. Make sure you discuss any questions you have with your health care provider. Document Released: 05/11/2009 Document Revised: 12/21/2015 Document Reviewed: 08/19/2014 Elsevier Interactive Patient Education  2017 [employer].

## 2019-01-26 NOTE — Telephone Encounter (Signed)
Spoke with pt today to complete her AWV telephonically. Pt is requesting a refill on her Hydrocodone-Acetaminophen and would like this refill sent to [facility] in [place]. Please advise, thanks -MM

## 2019-01-26 NOTE — Progress Notes (Signed)
Subjective:   [name] [name] is a 73 y.o. female who presents for Medicare Annual (Subsequent) preventive examination.    This visit is being conducted through telemedicine due to the COVID-19 pandemic. This patient has given me verbal consent via doximity to conduct this visit, patient states they are participating from their home address. Some vital signs may be absent or patient reported.    Patient identification: identified by name, DOB, and current address  Review of Systems:  N/A  Cardiac Risk Factors include: advanced age (>38men, >42 women);hypertension;dyslipidemia;smoking/ tobacco exposure     Objective:     Vitals: There were no vitals taken for this visit.  There is no height or weight on file to calculate BMI. Unable to obtain vitals due to visit being conducted via telephonically.   Advanced Directives 01/26/2019 04/07/2018 01/14/2018 10/23/2017 10/18/2017 12/11/2015 09/07/2015  Does Patient Have a Medical Advance Directive? No No No No No No No  Would patient like information on creating a medical advance directive? No - Patient declined - - No - Patient declined No - Patient declined No - patient declined information -    Tobacco Social History   Tobacco Use  Smoking Status Current Every Day Smoker  . Packs/day: 0.50  . Years: 50.00  . Pack years: 25.00  Smokeless Tobacco Never Used     Ready to quit: Yes Counseling given: No   Clinical Intake:  Pre-visit preparation completed: Yes  Pain : No/denies pain Pain Score: 0-No pain     Nutritional Risks: Nausea/ vomitting/ diarrhea(nausea occasionally after eating- unsure cause) Diabetes: No  How often do you need to have someone help you when you read instructions, pamphlets, or other written materials from your doctor or pharmacy?: 3 - Sometimes(Due to cataracts on both eyes.)  Interpreter Needed?: No  Information entered by :: [facility], LPN  Past Medical History:  Diagnosis Date  . Anxiety   . CAD  (coronary artery disease) unk  . COPD (chronic obstructive pulmonary disease) ([place])   . Depression   . Hypercholesteremia unk  . Hypertension    Past Surgical History:  Procedure Laterality Date  . ABDOMINAL HYSTERECTOMY  1982  . BACK SURGERY    . COLONOSCOPY WITH PROPOFOL N/A 04/07/2018   Procedure: COLONOSCOPY WITH PROPOFOL;  Surgeon: [name], MD;  Location: [facility] ENDOSCOPY;  Service: Endoscopy;  Laterality: N/A;  . KYPHOPLASTY N/A 10/23/2017   Procedure: [id number];  Surgeon: Hessie Knows, MD;  Location: [facility] ORS;  Service: Orthopedics;  Laterality: N/A;   Family History  Problem Relation Age of Onset  . Leukemia Mother   . Aneurysm Father   . Diabetes Father   . Heart disease Father   . Diabetes Maternal Grandmother   . Diabetes Paternal Grandmother    Social History   Socioeconomic History  . Marital status: Widowed    Spouse name: Not on file  . Number of children: 1  . Years of education: Not on file  . Highest education level: Some college, no degree  Occupational History    Employer: RETIRED  Social Needs  . Financial resource strain: Not hard at all  . Food insecurity    Worry: Never true    Inability: Never true  . Transportation needs    Medical: No    Non-medical: No  Tobacco Use  . Smoking status: Current Every Day Smoker    Packs/day: 0.50    Years: 50.00    Pack years: 25.00  . Smokeless tobacco: Never Used  Substance and Sexual Activity  . Alcohol use: Yes    Alcohol/week: 3.0 standard drinks    Types: 3 Glasses of wine per week  . Drug use: Yes    Types: Marijuana    Comment: in the past  . Sexual activity: Not on file  Lifestyle  . Physical activity    Days per week: 0 days    Minutes per session: 0 min  . Stress: Only a little  Relationships  . Social Herbalist on phone: Patient refused    Gets together: Patient refused    Attends religious service: Patient refused    Active member of club or organization: Patient  refused    Attends meetings of clubs or organizations: Patient refused    Relationship status: Patient refused  Other Topics Concern  . Not on file  Social History Narrative  . Not on file    Outpatient Encounter Medications as of 01/26/2019  Medication Sig  . albuterol (PROVENTIL HFA;VENTOLIN HFA) 108 (90 Base) MCG/ACT inhaler Inhale 2 puffs into the lungs every 4 (four) hours as needed for wheezing or shortness of breath. (Patient taking differently: Inhale 2 puffs into the lungs every 4 (four) hours as needed for wheezing or shortness of breath. )  . ALPRAZolam (XANAX) 0.5 MG tablet TAKE 1 TABLET(0.5 MG) BY MOUTH TWICE DAILY AS NEEDED FOR ANXIETY  . aspirin 81 MG tablet Take 81 mg by mouth daily.  . citalopram (CELEXA) 20 MG tablet TAKE 1 TABLET(20 MG) BY MOUTH DAILY  . Docusate Sodium (COLACE PO) Take 100 mg by mouth daily as needed (constipation).   . furosemide (LASIX) 20 MG tablet Take 20 mg by mouth daily as needed for fluid.   Marland Kitchen HYDROcodone-acetaminophen (NORCO/VICODIN) 5-325 MG tablet Take 1 tablet by mouth every 6 (six) hours as needed for moderate pain or severe pain.  Marland Kitchen ibuprofen (ADVIL,MOTRIN) 600 MG tablet Take 600 mg by mouth every 8 (eight) hours as needed.  Marland Kitchen lisinopril (ZESTRIL) 40 MG tablet Take 1 tablet (40 mg total) by mouth daily.  . metoprolol succinate (TOPROL-XL) 25 MG 24 hr tablet TAKE 1 TABLET BY MOUTH DAILY  . potassium chloride SA (K-DUR,KLOR-CON) 20 MEQ tablet Take 1 tablet (20 mEq total) by mouth daily.  . simvastatin (ZOCOR) 10 MG tablet TAKE 1 TABLET(10 MG) BY MOUTH DAILY  . Tiotropium Bromide Monohydrate (SPIRIVA RESPIMAT) 1.25 MCG/ACT AERS Inhale 1 puff into the lungs daily.  Marland Kitchen gabapentin (NEURONTIN) 300 MG capsule Take 1 capsule (300 mg total) by mouth at bedtime. (Patient not taking: Reported on 01/26/2019)  . mometasone-formoterol (DULERA) 200-5 MCG/ACT AERO Inhale 2 puffs into the lungs 2 (two) times daily.  . pantoprazole (PROTONIX) 20 MG tablet Take  20 mg by mouth daily as needed for heartburn.    No facility-administered encounter medications on file as of 01/26/2019.     Activities of Daily Living In your present state of health, do you have any difficulty performing the following activities: 01/26/2019  Hearing? Y  Comment Wears bilateral hearing aids.  Vision? Y  Comment Due to cataracts on both eyes.  Difficulty concentrating or making decisions? N  Walking or climbing stairs? Y  Comment Due to pain and SOB.  Dressing or bathing? N  Doing errands, shopping? N  Preparing Food and eating ? N  Using the Toilet? N  In the past six months, have you accidently leaked urine? Y  Comment Occasionally- wears protection daily.  Do you have problems with  loss of bowel control? N  Managing your Medications? N  Managing your Finances? N  Housekeeping or managing your Housekeeping? N  Some recent data might be hidden    Patient Care Team: Mar Daring, PA-C as PCP - General (Family Medicine) Isaias Cowman, MD as Consulting Physician (Cardiology) [name], MD as Consulting Physician (Gastroenterology) Abbie Sons, MD (Urology)    Assessment:   This is a routine wellness examination for [place].  Exercise Activities and Dietary recommendations Current Exercise Habits: The patient does not participate in regular exercise at present, Exercise limited by: orthopedic condition(s)  Goals    . Exercise 150 minutes per week (moderate activity)    . Quit Smoking     Recommend to continue efforts to reduce smoking habits until no longer smoking.        Fall Risk: Fall Risk  01/26/2019 01/08/2018 10/28/2017 09/26/2017 05/20/2017  Falls in the past year? 0 Yes Yes No No  Comment - Mid-end of May - - -  Number falls in past yr: - 1 1 - -  Injury with Fall? - Yes No - -  Comment - She reports that she hit her head,back and shoulder,tight side  - - -  Risk for fall due to : - - - - -    FALL RISK PREVENTION  PERTAINING TO THE HOME:  Any stairs in or around the home? Yes  If so, are there any without handrails? No   Home free of loose throw rugs in walkways, pet beds, electrical cords, etc? Yes  Adequate lighting in your home to reduce risk of falls? Yes   ASSISTIVE DEVICES UTILIZED TO PREVENT FALLS:  Life alert? No  Use of a cane, walker or w/c? Yes  Grab bars in the bathroom? No  Shower chair or bench in shower? Yes  Elevated toilet seat or a handicapped toilet? Yes    TIMED UP AND GO:  Was the test performed? No .    Depression Screen PHQ 2/9 Scores 01/26/2019 09/26/2017 08/15/2017 06/23/2017  PHQ - 2 Score 5 4 6 1   PHQ- 9 Score 21 18 19 8      Cognitive Function: Declined today.         Immunization History  Administered Date(s) Administered  . Influenza, High Dose Seasonal PF 05/23/2016, 05/20/2017, 05/27/2018  . Influenza,inj,Quad PF,6+ Mos 08/08/2015  . Pneumococcal Conjugate-13 03/11/2014  . Pneumococcal Polysaccharide-23 06/26/2006, 05/23/2016  . Tdap 03/22/2011    Qualifies for Shingles Vaccine? Yes . Due for Shingrix. Education has been provided regarding the importance of this vaccine. Pt has been advised to call insurance company to determine out of pocket expense. Advised may also receive vaccine at local pharmacy or Health Dept. Verbalized acceptance and understanding.  Tdap: Up to date  Flu Vaccine: Up to date  Pneumococcal Vaccine: Completed series  Screening Tests Health Maintenance  Topic Date Due  . INFLUENZA VACCINE  02/27/2019  . MAMMOGRAM  03/04/2019  . COLONOSCOPY  04/08/2019  . DEXA SCAN  03/03/2020  . TETANUS/TDAP  03/21/2021  . Hepatitis C Screening  Completed  . PNA vac Low Risk Adult  Completed    Cancer Screenings:  Colorectal Screening: Completed 04/07/18. Repeat every yearly.  Mammogram: Completed 03/03/18.  Bone Density: Completed 03/03/18. Results reflect OSTEOPOROSIS. Repeat every 2 years.   Lung Cancer Screening: (Low  Dose CT Chest recommended if Age 35-80 years, 30 pack-year currently smoking OR have quit w/in 15years.) does qualify. An Epic message has been  sent to Burgess Estelle, RN (Oncology Nurse Navigator) regarding the possible need for this exam. Raquel Sarna will review the patient's chart to determine if the patient truly qualifies for the exam. If the patient qualifies, Raquel Sarna will order the Low Dose CT of the chest to facilitate the scheduling of this exam.  Additional Screening:  Hepatitis C Screening: Up to date  Vision Screening: Recommended annual ophthalmology exams for early detection of glaucoma and other disorders of the eye.  Dental Screening: Recommended annual dental exams for proper oral hygiene  Community Resource Referral:  CRR required this visit?  No       Plan:  I have personally reviewed and addressed the Medicare Annual Wellness questionnaire and have noted the following in the patient's chart:  A. Medical and social history B. Use of alcohol, tobacco or illicit drugs  C. Current medications and supplements D. Functional ability and status E.  Nutritional status F.  Physical activity G. Advance directives H. List of other physicians I.  Hospitalizations, surgeries, and ER visits in previous 12 months J.  [place] such as hearing and vision if needed, cognitive and depression L. Referrals and appointments   In addition, I have reviewed and discussed with patient certain preventive protocols, quality metrics, and best practice recommendations. A written personalized care plan for preventive services as well as general preventive health recommendations were provided to patient. Nurse Health Advisor  Signed,    Johnnell Liou [place], [place]  12/24/4130 Nurse Health Advisor   Nurse Notes: None.

## 2019-01-27 ENCOUNTER — Telehealth: Payer: Self-pay | Admitting: *Deleted

## 2019-01-27 DIAGNOSIS — J449 Chronic obstructive pulmonary disease, unspecified: Secondary | ICD-10-CM | POA: Diagnosis not present

## 2019-01-27 NOTE — Telephone Encounter (Signed)
Received referral for low dose lung cancer screening CT scan. Attempted to leave message at phone number listed in EMR for patient to call me back to facilitate scheduling scan. However, there was no voicemail option.

## 2019-02-01 ENCOUNTER — Encounter: Payer: Self-pay | Admitting: *Deleted

## 2019-02-01 ENCOUNTER — Telehealth: Payer: Self-pay | Admitting: *Deleted

## 2019-02-01 NOTE — Telephone Encounter (Signed)
Attempted to contact regarding scheduling lung screening scan, however, there is no answer or voicemail at either # noted in EMR. Will send letter.

## 2019-02-05 ENCOUNTER — Encounter: Payer: Self-pay | Admitting: Physician Assistant

## 2019-02-05 ENCOUNTER — Other Ambulatory Visit: Payer: Self-pay

## 2019-02-05 ENCOUNTER — Ambulatory Visit (INDEPENDENT_AMBULATORY_CARE_PROVIDER_SITE_OTHER): Payer: Medicare Other | Admitting: Physician Assistant

## 2019-02-05 DIAGNOSIS — Z20828 Contact with and (suspected) exposure to other viral communicable diseases: Secondary | ICD-10-CM

## 2019-02-05 DIAGNOSIS — R634 Abnormal weight loss: Secondary | ICD-10-CM

## 2019-02-05 DIAGNOSIS — J209 Acute bronchitis, unspecified: Secondary | ICD-10-CM

## 2019-02-05 DIAGNOSIS — J440 Chronic obstructive pulmonary disease with acute lower respiratory infection: Secondary | ICD-10-CM

## 2019-02-05 DIAGNOSIS — K5903 Drug induced constipation: Secondary | ICD-10-CM

## 2019-02-05 DIAGNOSIS — J441 Chronic obstructive pulmonary disease with (acute) exacerbation: Secondary | ICD-10-CM

## 2019-02-05 DIAGNOSIS — T402X5A Adverse effect of other opioids, initial encounter: Secondary | ICD-10-CM

## 2019-02-05 DIAGNOSIS — Z20822 Contact with and (suspected) exposure to covid-19: Secondary | ICD-10-CM

## 2019-02-05 DIAGNOSIS — F320 Major depressive disorder, single episode, mild: Secondary | ICD-10-CM | POA: Insufficient documentation

## 2019-02-05 DIAGNOSIS — F330 Major depressive disorder, recurrent, mild: Secondary | ICD-10-CM | POA: Insufficient documentation

## 2019-02-05 MED ORDER — LUBIPROSTONE 24 MCG PO CAPS: 24.0000 ug | ORAL_CAPSULE | Freq: Two times a day (BID) | ORAL | 1 refills | Status: DC

## 2019-02-05 MED ORDER — SPIRIVA RESPIMAT 1.25 MCG/ACT IN AERS: 1.0000 | INHALATION_SPRAY | Freq: Every day | RESPIRATORY_TRACT | 5 refills | Status: DC

## 2019-02-05 MED ORDER — AZITHROMYCIN 250 MG PO TABS: ORAL_TABLET | ORAL | 0 refills | Status: DC

## 2019-02-05 MED ORDER — PREDNISONE 10 MG PO TABS: ORAL_TABLET | ORAL | 0 refills | Status: DC

## 2019-02-05 MED ORDER — ALBUTEROL SULFATE HFA 108 (90 BASE) MCG/ACT IN AERS: 2.0000 | INHALATION_SPRAY | RESPIRATORY_TRACT | 5 refills | Status: DC | PRN

## 2019-02-05 NOTE — Progress Notes (Signed)
Virtual Visit via Telephone Note  I connected with [name] [name] on 02/05/19 at  3:00 PM EDT by telephone and verified that I am speaking with the correct person using two identifiers.  Location: Patient: Home Provider: [facility]   I discussed the limitations, risks, security and privacy concerns of performing an evaluation and management service by telephone and the availability of in person appointments. I also discussed with the patient that there may be a patient responsible charge related to this service. The patient expressed understanding and agreed to proceed.  Mar Daring, PA-C    Patient: [name] [name], Female    DOB: September 11, 1945, 73 y.o.   MRN: [id number] Visit Date: 02/05/2019  Today's Provider: Mar Daring, PA-C   No chief complaint on file.  Subjective:     Patient had AWV 01/26/2019 with NHA on 01/26/2019.  [name] is a 73 yr old female that presents via telephone visit. She has multiple complaints today.   First she is complaining of constipation and diarrhea (watery stools). She reports this has been ongoing for about a month or longer and is causing more and more abdominal pain. She is on chronic narcotic therapy for compression fractures in her thoracic spine.   Then last week she had family from [place] come visit. They did not stay at her house longer than one hour, but since they have been home this week, all but one have been positive for Covid-19. She has been having increased productive cough and muscle aches. She denies fever.  ------------------------------------------------------   Review of Systems  Constitutional: Positive for fatigue.  HENT: Positive for congestion and postnasal drip. Negative for ear pain, rhinorrhea, sinus pressure, sinus pain and sore throat.   Respiratory: Positive for cough and shortness of breath. Negative for chest tightness and wheezing.   Cardiovascular: Negative.   Gastrointestinal: Positive for abdominal  distention, constipation and diarrhea. Negative for nausea and vomiting.  Musculoskeletal: Positive for back pain.  Neurological: Negative for dizziness, light-headedness and headaches.    Social History   Socioeconomic History  . Marital status: Widowed    Spouse name: Not on file  . Number of children: 1  . Years of education: Not on file  . Highest education level: Some college, no degree  Occupational History    Employer: RETIRED  Social Needs  . Financial resource strain: Not hard at all  . Food insecurity    Worry: Never true    Inability: Never true  . Transportation needs    Medical: No    Non-medical: No  Tobacco Use  . Smoking status: Current Every Day Smoker    Packs/day: 0.50    Years: 50.00    Pack years: 25.00  . Smokeless tobacco: Never Used  Substance and Sexual Activity  . Alcohol use: Yes    Alcohol/week: 3.0 standard drinks    Types: 3 Glasses of wine per week  . Drug use: Yes    Types: Marijuana    Comment: in the past  . Sexual activity: Not on file  Lifestyle  . Physical activity    Days per week: 0 days    Minutes per session: 0 min  . Stress: Only a little  Relationships  . Social Herbalist on phone: Patient refused    Gets together: Patient refused    Attends religious service: Patient refused    Active member of club or organization: Patient refused    Attends meetings of  clubs or organizations: Patient refused    Relationship status: Patient refused  . Intimate partner violence    Fear of current or ex partner: Patient refused    Emotionally abused: Patient refused    Physically abused: Patient refused    Forced sexual activity: Patient refused  Other Topics Concern  . Not on file  Social History Narrative  . Not on file    Past Medical History:  Diagnosis Date  . Anxiety   . CAD (coronary artery disease) unk  . COPD (chronic obstructive pulmonary disease) ([place])   . Depression   . Hypercholesteremia unk  .  Hypertension      Patient Active Problem List   Diagnosis Date Noted  . Personal history of colonic polyps   . Benign neoplasm of cecum   . Benign neoplasm of transverse colon   . Benign neoplasm of ascending colon   . Closed compression fracture of thoracolumbar vertebra ([place]) 10/28/2017  . 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 Renal cyst 10/28/2017  . Hypokalemia 10/18/2017  . Chronic congestive heart failure ([place]) 09/26/2017  . Alcohol abuse 08/28/2016  . Pedal edema 03/13/2016  . Osteoporosis 09/07/2015  . Pleural effusion 02/03/2015  . Rib fracture 02/02/2015  . Body mass index (BMI) of 23.0-23.9 in adult 12/29/2014  . Gastric ulcer 12/29/2014  . H/O transient cerebral ischemia 12/29/2014  . HLD (hyperlipidemia) 12/29/2014  . BP (high blood pressure) 12/29/2014  . Below normal amount of sodium in the blood 12/29/2014  . Awareness of heartbeats 12/29/2014  . Abnormal Pap smear of vagina 12/29/2014  . Plantar fasciitis 12/29/2014  . Esophageal reflux 12/29/2014  . 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 TI (tricuspid incompetence) 12/31/2013  . Chest pain 12/31/2013  . Chronic obstructive pulmonary disease ([place]) 12/31/2013  . Abnormal blood sugar 03/01/2009  . GAD (generalized anxiety disorder) 02/15/2009  . Current tobacco use 06/26/2006    Past Surgical History:  Procedure Laterality Date  . ABDOMINAL HYSTERECTOMY  1982  . BACK SURGERY    . COLONOSCOPY WITH PROPOFOL N/A 04/07/2018   Procedure: COLONOSCOPY WITH PROPOFOL;  Surgeon: [name], MD;  Location: [facility] ENDOSCOPY;  Service: Endoscopy;  Laterality: N/A;  . KYPHOPLASTY N/A 10/23/2017   Procedure: [id number];  Surgeon: Hessie Knows, MD;  Location: [facility] ORS;  Service: Orthopedics;  Laterality: N/A;    Her family history includes Aneurysm in her father; Diabetes in her father, maternal grandmother, and paternal grandmother; Heart disease in her father; Leukemia in her mother.    Current Outpatient Medications:  .  albuterol (PROVENTIL HFA;VENTOLIN HFA) 108 (90 Base) MCG/ACT inhaler, Inhale 2 puffs into the lungs every 4 (four) hours as needed for wheezing or shortness of breath. (Patient taking differently: Inhale 2 puffs into the lungs every 4 (four) hours as needed for wheezing or shortness of breath. ), Disp: 1 Inhaler, Rfl: 1 .  ALPRAZolam (XANAX) 0.5 MG tablet, TAKE 1 TABLET(0.5 MG) BY MOUTH TWICE DAILY AS NEEDED FOR ANXIETY, Disp: 60 tablet, Rfl: 5 .  aspirin 81 MG tablet, Take 81 mg by mouth daily., Disp: , Rfl:  .  citalopram (CELEXA) 20 MG tablet, TAKE 1 TABLET(20 MG) BY MOUTH DAILY, Disp: 90 tablet, Rfl: 1 .  Docusate Sodium (COLACE PO), Take 100 mg by mouth daily as needed (constipation). , Disp: , Rfl:  .  furosemide (LASIX) 20 MG tablet, Take 20 mg by mouth daily as needed for fluid. , Disp: , Rfl:  .  gabapentin (NEURONTIN) 300  MG capsule, Take 1 capsule (300 mg total) by mouth at bedtime. (Patient not taking: Reported on 01/26/2019), Disp: 90 capsule, Rfl: 1 .  HYDROcodone-acetaminophen (NORCO/VICODIN) 5-325 MG tablet, Take 1 tablet by mouth every 6 (six) hours as needed for moderate pain or severe pain., Disp: 120 tablet, Rfl: 0 .  ibuprofen (ADVIL,MOTRIN) 600 MG tablet, Take 600 mg by mouth every 8 (eight) hours as needed., Disp: , Rfl:  .  lisinopril (ZESTRIL) 40 MG tablet, Take 1 tablet (40 mg total) by mouth daily., Disp: 90 tablet, Rfl: 0 .  metoprolol succinate (TOPROL-XL) 25 MG 24 hr tablet, TAKE 1 TABLET BY MOUTH DAILY, Disp: 90 tablet, Rfl: 0 .  mometasone-formoterol (DULERA) 200-5 MCG/ACT AERO, Inhale 2 puffs into the lungs 2 (two) times daily., Disp: , Rfl:  .  pantoprazole (PROTONIX) 20 MG tablet, Take 20 mg by mouth daily as needed for heartburn. , Disp: , Rfl:  .  potassium chloride SA (K-DUR,KLOR-CON) 20 MEQ tablet, Take 1 tablet (20 mEq total) by mouth daily., Disp: 90 tablet, Rfl: 1 .  simvastatin (ZOCOR) 10 MG tablet, TAKE 1 TABLET(10 MG) BY  MOUTH DAILY, Disp: 90 tablet, Rfl: 1 .  Tiotropium Bromide Monohydrate (SPIRIVA RESPIMAT) 1.25 MCG/ACT AERS, Inhale 1 puff into the lungs daily., Disp: 4 g, Rfl: 5  Patient Care Team: Mar Daring, PA-C as PCP - General (Family Medicine) Isaias Cowman, MD as Consulting Physician (Cardiology) [name], MD as Consulting Physician (Gastroenterology) Abbie Sons, MD (Urology)     Objective:    Vitals: There were no vitals taken for this visit.  Physical Exam Constitutional:      Appearance: Normal appearance.  Pulmonary:     Effort: No respiratory distress.  Neurological:     Mental Status: She is alert.      Assessment & Plan:   1. Therapeutic opioid-induced constipation (OIC) Worsening. Will try Amitiza as below. Has failed Imodium and pepto bismol. I will f/u in 4 weeks.  - lubiprostone (AMITIZA) 24 MCG capsule; Take 1 capsule (24 mcg total) by mouth 2 (two) times daily with a meal.  Dispense: 180 capsule; Refill: 1  2. Weight loss COPD and still smoking. Low dose Chest CT scheduled and patient aware to call to schedule.   3. Acute bronchitis with COPD ([place]) Worsening. Will send for covid testing but also will send in prednisone and zpak since high risk for bacterial complications.  - azithromycin (ZITHROMAX) 250 MG tablet; Take 2 tablets PO on day one, and one tablet PO daily thereafter until completed.  Dispense: 6 tablet; Refill: 0 - predniSONE (DELTASONE) 10 MG tablet; Take 6 tabs PO on day 1&2, 5 tabs PO on day 3&4, 4 tabs PO on day 5&6, 3 tabs PO on day 7&8, 2 tabs PO on day 9&10, 1 tab PO on day 11&12.  Dispense: 42 tablet; Refill: 0  4. Exposure to Covid-19 Virus Family members that visited from [place] tested positive. Will refer for testing.   5. Chronic obstructive pulmonary disease (HCC) Stable. Diagnosis pulled for medication refill. Continue current medical treatment plan. - albuterol (VENTOLIN HFA) 108 (90 Base) MCG/ACT inhaler; Inhale  2 puffs into the lungs every 4 (four) hours as needed for wheezing or shortness of breath.  Dispense: 18 g; Refill: 5 - Tiotropium Bromide Monohydrate (SPIRIVA RESPIMAT) 1.25 MCG/ACT AERS; Inhale 1 puff into the lungs daily.  Dispense: 4 g; Refill: 5  ------------------------------------------------------------------------------------------------------------  I discussed the assessment and treatment plan with the patient. The  patient was provided an opportunity to ask questions and all were answered. The patient agreed with the plan and demonstrated an understanding of the instructions.   The patient was advised to call back or seek an in-person evaluation if the symptoms worsen or if the condition fails to improve as anticipated.  I provided 18 minutes of non-face-to-face time during this encounter.   Mar Daring, PA-C  [facility]

## 2019-02-05 NOTE — Patient Instructions (Signed)

## 2019-02-08 ENCOUNTER — Telehealth: Payer: Self-pay | Admitting: *Deleted

## 2019-02-08 DIAGNOSIS — Z20822 Contact with and (suspected) exposure to covid-19: Secondary | ICD-10-CM

## 2019-02-08 NOTE — Telephone Encounter (Signed)
Call to patient- patient has been scheduled.

## 2019-02-08 NOTE — Telephone Encounter (Signed)
Ordering Provider: Fenton Malling [facility] [address] [place], [place]  [address] Phone:  [phone]   Fax:  [phone]  February 05, 2019  Patient: [name] [name]  MRN: [id number]  Date of Birth: May 10, 1946  Date of Visit: 02/05/2019    Patient with COPD having increased productive cough and muscle aches. No fever. Was exposed to covid possibly last week from family visiting from [place].   Attempted to call patient on both numbers listed- mail box is full and can not leave message. Order have been placed.

## 2019-02-11 ENCOUNTER — Other Ambulatory Visit: Payer: Medicare Other

## 2019-02-11 DIAGNOSIS — R6889 Other general symptoms and signs: Secondary | ICD-10-CM | POA: Diagnosis not present

## 2019-02-11 DIAGNOSIS — Z20822 Contact with and (suspected) exposure to covid-19: Secondary | ICD-10-CM

## 2019-02-14 ENCOUNTER — Other Ambulatory Visit: Payer: Self-pay | Admitting: Physician Assistant

## 2019-02-14 DIAGNOSIS — F411 Generalized anxiety disorder: Secondary | ICD-10-CM

## 2019-02-14 DIAGNOSIS — I10 Essential (primary) hypertension: Secondary | ICD-10-CM

## 2019-02-14 DIAGNOSIS — E7800 Pure hypercholesterolemia, unspecified: Secondary | ICD-10-CM

## 2019-02-15 LAB — NOVEL CORONAVIRUS, NAA: SARS-CoV-2, NAA: NOT DETECTED

## 2019-02-19 ENCOUNTER — Ambulatory Visit: Payer: Medicare Other | Admitting: Urology

## 2019-02-25 ENCOUNTER — Other Ambulatory Visit: Payer: Self-pay | Admitting: Physician Assistant

## 2019-02-25 DIAGNOSIS — S22000A Wedge compression fracture of unspecified thoracic vertebra, initial encounter for closed fracture: Secondary | ICD-10-CM

## 2019-02-25 MED ORDER — HYDROCODONE-ACETAMINOPHEN 5-325 MG PO TABS: 1.0000 | ORAL_TABLET | Freq: Four times a day (QID) | ORAL | 0 refills | Status: DC | PRN

## 2019-02-25 NOTE — Telephone Encounter (Signed)
Pt needing refill on:  HYDROcodone-acetaminophen (NORCO/VICODIN) 5-325 MG tablet  Please fill at:   [facility], [place] [address] [phone] (Phone) [phone] (Fax)     Thanks, [employer]

## 2019-02-27 DIAGNOSIS — J449 Chronic obstructive pulmonary disease, unspecified: Secondary | ICD-10-CM | POA: Diagnosis not present

## 2019-03-16 ENCOUNTER — Other Ambulatory Visit: Payer: Self-pay

## 2019-03-16 ENCOUNTER — Ambulatory Visit
Admission: RE | Admit: 2019-03-16 | Discharge: 2019-03-16 | Disposition: A | Discharge status: Home / Self Care | Payer: Medicare Other | Source: Ambulatory Visit | Attending: Urology | Admitting: Urology

## 2019-03-16 DIAGNOSIS — N1330 Unspecified hydronephrosis: Secondary | ICD-10-CM | POA: Diagnosis not present

## 2019-03-16 DIAGNOSIS — N281 Cyst of kidney, acquired: Secondary | ICD-10-CM | POA: Diagnosis not present

## 2019-03-21 ENCOUNTER — Other Ambulatory Visit: Payer: Self-pay | Admitting: Urology

## 2019-03-21 ENCOUNTER — Encounter: Payer: Self-pay | Admitting: Urology

## 2019-03-21 DIAGNOSIS — N1330 Unspecified hydronephrosis: Secondary | ICD-10-CM

## 2019-03-21 DIAGNOSIS — N281 Cyst of kidney, acquired: Secondary | ICD-10-CM

## 2019-03-29 ENCOUNTER — Other Ambulatory Visit: Payer: Self-pay

## 2019-03-29 DIAGNOSIS — S22000A Wedge compression fracture of unspecified thoracic vertebra, initial encounter for closed fracture: Secondary | ICD-10-CM

## 2019-03-29 MED ORDER — HYDROCODONE-ACETAMINOPHEN 5-325 MG PO TABS: 1.0000 | ORAL_TABLET | Freq: Four times a day (QID) | ORAL | 0 refills | Status: DC | PRN

## 2019-03-30 DIAGNOSIS — J449 Chronic obstructive pulmonary disease, unspecified: Secondary | ICD-10-CM | POA: Diagnosis not present

## 2019-04-02 ENCOUNTER — Ambulatory Visit: Payer: Medicare Other | Admitting: Urology

## 2019-04-07 ENCOUNTER — Telehealth: Payer: Self-pay | Admitting: Urology

## 2019-04-07 NOTE — Telephone Encounter (Signed)
Please advise, thanks.

## 2019-04-07 NOTE — Telephone Encounter (Signed)
Pt had the [place] call here to question why her CT was ordered as a W/WO she would like for someone to call he to explain why it was ordered this way. Her CT is scheduled for 04/08/2019.

## 2019-04-08 ENCOUNTER — Other Ambulatory Visit: Payer: Self-pay

## 2019-04-08 ENCOUNTER — Ambulatory Visit
Admission: RE | Admit: 2019-04-08 | Discharge: 2019-04-08 | Disposition: A | Discharge status: Home / Self Care | Payer: Medicare Other | Source: Ambulatory Visit | Attending: Urology | Admitting: Urology

## 2019-04-08 DIAGNOSIS — N281 Cyst of kidney, acquired: Secondary | ICD-10-CM | POA: Diagnosis not present

## 2019-04-08 DIAGNOSIS — N1330 Unspecified hydronephrosis: Secondary | ICD-10-CM | POA: Diagnosis not present

## 2019-04-08 LAB — POCT I-STAT CREATININE: Creatinine, Ser: 1.2 mg/dL — ABNORMAL HIGH (ref 0.44–1.00)

## 2019-04-08 MED ORDER — IOHEXOL 300 MG/ML  SOLN
75.0000 mL | Freq: Once | INTRAMUSCULAR | Status: AC | PRN
  Administered 2019-04-08: 75 mL via INTRAVENOUS

## 2019-04-08 NOTE — Telephone Encounter (Signed)
CT with and without contrast is the only way to characterize her cyst.  This study was recommended by radiology.  The alternative would be an MRI with and without contrast

## 2019-04-09 ENCOUNTER — Encounter: Payer: Self-pay | Admitting: Urology

## 2019-04-09 ENCOUNTER — Ambulatory Visit: Payer: Medicare Other | Admitting: Urology

## 2019-04-09 VITALS — BP 147/76 | HR 65 | Ht 62.0 in | Wt 88.0 lb

## 2019-04-09 DIAGNOSIS — N1330 Unspecified hydronephrosis: Secondary | ICD-10-CM | POA: Diagnosis not present

## 2019-04-09 DIAGNOSIS — N281 Cyst of kidney, acquired: Secondary | ICD-10-CM

## 2019-04-09 NOTE — Telephone Encounter (Signed)
Pt seen in clinic today and advised of information below.

## 2019-04-09 NOTE — Progress Notes (Signed)
04/09/2019 1:19 PM   [name] [name] [birth year] [id number]  Referring provider: Mar Daring, PA-C [place] [address] [place],  [place] [address]  Chief Complaint  Patient presents with  . Follow-up    HPI: 73 y.o. female presents for follow-up renal cysts.  Prior CT and renal ultrasound in 2019 showed questionable complex cyst.  She had a follow-up renal ultrasound performed in August 2020 which showed new onset right hydronephrosis and indeterminate right renal lesion.  Renal mass protocol CT or MRI was recommended.  She had a CT performed yesterday.  She does have mild to moderate right hydronephrosis at least to the proximal ureter which was not commented in the report.  She has simple left renal cyst and proteinaceous renal cyst.  The right renal lesion was felt to most likely represent a Bosniak 2 cyst however six-month follow-up imaging was recommended.  Although she does not have right flank pain she complains of pain along the right mid rib cage.  She has a history of osteoporosis and a rib fractures.   PMH: Past Medical History:  Diagnosis Date  . Anxiety   . CAD (coronary artery disease) unk  . COPD (chronic obstructive pulmonary disease) ([place])   . Depression   . Hypercholesteremia unk  . Hypertension     Surgical History: Past Surgical History:  Procedure Laterality Date  . ABDOMINAL HYSTERECTOMY  1982  . BACK SURGERY    . COLONOSCOPY WITH PROPOFOL N/A 04/07/2018   Procedure: COLONOSCOPY WITH PROPOFOL;  Surgeon: [name], MD;  Location: [facility] ENDOSCOPY;  Service: Endoscopy;  Laterality: N/A;  . KYPHOPLASTY N/A 10/23/2017   Procedure: [id number];  Surgeon: Hessie Knows, MD;  Location: [facility] ORS;  Service: Orthopedics;  Laterality: N/A;    Home Medications:  Allergies as of 04/09/2019   No Known Allergies     Medication List       Accurate as of April 09, 2019  1:19 PM. If you have any questions, ask your nurse or doctor.         STOP taking these medications   azithromycin 250 MG tablet Commonly known as: ZITHROMAX Stopped by: Abbie Sons, MD   lubiprostone 24 MCG capsule Commonly known as: Amitiza Stopped by: Abbie Sons, MD   predniSONE 10 MG tablet Commonly known as: DELTASONE Stopped by: Abbie Sons, MD     TAKE these medications   albuterol 108 (90 Base) MCG/ACT inhaler Commonly known as: VENTOLIN HFA Inhale 2 puffs into the lungs every 4 (four) hours as needed for wheezing or shortness of breath.   ALPRAZolam 0.5 MG tablet Commonly known as: XANAX TAKE 1 TABLET(0.5 MG) BY MOUTH TWICE DAILY AS NEEDED FOR ANXIETY   aspirin 81 MG tablet Take 81 mg by mouth daily.   citalopram 20 MG tablet Commonly known as: CELEXA TAKE 1 TABLET(20 MG) BY MOUTH DAILY   COLACE PO Take 100 mg by mouth daily as needed (constipation).   Dulera 200-5 MCG/ACT Aero Generic drug: mometasone-formoterol Inhale 2 puffs into the lungs 2 (two) times daily.   furosemide 20 MG tablet Commonly known as: LASIX Take 20 mg by mouth daily as needed for fluid.   HYDROcodone-acetaminophen 5-325 MG tablet Commonly known as: NORCO/VICODIN Take 1 tablet by mouth every 6 (six) hours as needed for moderate pain or severe pain.   ibuprofen 600 MG tablet Commonly known as: ADVIL Take 600 mg by mouth every 8 (eight) hours as needed.   lisinopril 40 MG tablet  Commonly known as: ZESTRIL TAKE 1 TABLET(40 MG) BY MOUTH DAILY   metoprolol succinate 25 MG 24 hr tablet Commonly known as: TOPROL-XL TAKE 1 TABLET BY MOUTH DAILY   pantoprazole 20 MG tablet Commonly known as: PROTONIX Take 20 mg by mouth daily as needed for heartburn.   potassium chloride SA 20 MEQ tablet Commonly known as: K-DUR Take 1 tablet (20 mEq total) by mouth daily.   simvastatin 10 MG tablet Commonly known as: ZOCOR TAKE 1 TABLET(10 MG) BY MOUTH DAILY   Spiriva Respimat 1.25 MCG/ACT Aers Generic drug: Tiotropium Bromide Monohydrate Inhale  1 puff into the lungs daily.       Allergies: No Known Allergies  Family History: Family History  Problem Relation Age of Onset  . Leukemia Mother   . Aneurysm Father   . Diabetes Father   . Heart disease Father   . Diabetes Maternal Grandmother   . Diabetes Paternal Grandmother     Social History:  reports that she has been smoking. She has a 25.00 pack-year smoking history. She has never used smokeless tobacco. She reports current alcohol use of about 3.0 standard drinks of alcohol per week. She reports current drug use. Drug: Marijuana.  ROS: UROLOGY Frequent Urination?: Yes Hard to postpone urination?: Yes Burning/pain with urination?: No Get up at night to urinate?: Yes Leakage of urine?: Yes Urine stream starts and stops?: Yes Trouble starting stream?: No Do you have to strain to urinate?: No Blood in urine?: No Urinary tract infection?: No Sexually transmitted disease?: No Injury to kidneys or bladder?: No Painful intercourse?: No Weak stream?: No Currently pregnant?: No Vaginal bleeding?: No Last menstrual period?: n  Gastrointestinal Nausea?: No Vomiting?: No Indigestion/heartburn?: Yes Diarrhea?: Yes Constipation?: Yes  Constitutional Fever: No Night sweats?: No Weight loss?: No Fatigue?: No  Skin Skin rash/lesions?: No Itching?: No  Eyes Blurred vision?: No Double vision?: No  Ears/Nose/Throat Sore throat?: No Sinus problems?: No  Hematologic/Lymphatic Swollen glands?: No Easy bruising?: No  Cardiovascular Leg swelling?: No Chest pain?: No  Respiratory Cough?: No Shortness of breath?: No  Endocrine Excessive thirst?: No  Musculoskeletal Back pain?: Yes Joint pain?: Yes  Neurological Headaches?: No Dizziness?: Yes  Psychologic Depression?: No Anxiety?: No  Physical Exam: BP (!) 147/76   Pulse 65   Ht 5\' 2"  (1.575 m)   Wt 88 lb (39.9 kg)   BMI 16.10 kg/m   Constitutional:  Alert and oriented, No acute  distress. HEENT: [place] AT, moist mucus membranes.  Trachea midline, no masses. Cardiovascular: No clubbing, cyanosis, or edema. Respiratory: Normal respiratory effort, no increased work of breathing. Neurologic: Grossly intact, no focal deficits, moving all 4 extremities. Psychiatric: Normal mood and affect.   Pertinent Imaging: Images were personally reviewed  Results for orders placed during the hospital encounter of 10/18/17  CT RENAL STONE STUDY   Narrative CLINICAL DATA:  Left flank pain for the past 5 days.  EXAM: CT ABDOMEN AND PELVIS WITHOUT CONTRAST  TECHNIQUE: Multidetector CT imaging of the abdomen and pelvis was performed following the standard protocol without IV contrast.  COMPARISON:  Abdomen ultrasound dated 12/08/2013.  FINDINGS: Lower chest: Prominent pulmonary vasculature at the lung bases. Minimal pericardial fluid with a maximum thickness of 6 mm. No pleural fluid. Coronary artery calcifications. Dense mitral valve annulus calcifications.  Hepatobiliary: No focal liver abnormality is seen. No gallstones, gallbladder wall thickening, or biliary dilatation.  Pancreas: Diffuse pancreatic atrophy.  Spleen: Normal in size without focal abnormality.  Adrenals/Urinary Tract: The  previously demonstrated 2.0 cm septated cyst in the midpole of the left kidney is grossly stable, measuring 1.9 x 0.9 cm on image number 24 series 2. There is also a 1.1 cm exophytic probable cyst measuring up to 14 Hounsfield units in density arising from the upper pole of the left kidney. There is also a 9 mm exophytic probable cyst measuring up to 15 Hounsfield units in density arising from the lower pole of the right kidney. Normal appearing ureters, urinary bladder and adrenal glands. No urinary tract calculi or hydronephrosis.  Stomach/Bowel: Large number of sigmoid and descending colon diverticula. Normal appearing appendix, stomach and small bowel with the exception of an  elongated fat density mass in a loop of jejunum in the left mid abdomen, measuring 2.7 x 1.0 cm in maximum dimensions on image number 64 series 5.  Vascular/Lymphatic: Atheromatous arterial calcifications without aneurysm. No enlarged lymph nodes.  Reproductive: Status post hysterectomy. No adnexal masses.  Other: No abdominal wall hernia or abnormality. No abdominopelvic ascites.  Musculoskeletal: Approximately 70% L1 vertebral compression deformity with mild bony retropulsion and no acute fracture lines visualized. There is also an approximately 25% T11 vertebral body superior endplate compression deformity with mild Schmorl's node formation and no acute fracture lines. Facet degenerative changes in the mid and lower lumbar spine with associated grade 1 anterolisthesis at the L4-5 level. No pars defects. Mild to moderate anterior spur formation at multiple levels of the lumbar and lower thoracic spine.  IMPRESSION: 1. No urinary tract calculi or hydronephrosis. 2. Small probable mildly complicated cysts arising from the upper pole of the left kidney and lower pole of the right kidney, not seen at previous ultrasound. A repeat right renal ultrasound is recommended to help determine if these are cystic or solid. 3. Pulmonary vascular congestion. 4. Minimal pericardial effusion. 5. Atheromatous arterial calcifications, including the coronary arteries. 6. Extensive sigmoid and descending colon diverticulosis. 7. Old T11 and L1 vertebral compression fractures. 8. 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   Electronically Signed   By: Claudie Revering M.D.   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  The cyst were noted back in 2019.  Radiology recommended a six-month follow-up CT.  To better characterize we will obtain a renal mass protocol  MRI in 8 to 9 months.  - Right hydronephrosis New onset.  No stone identified on CT.  Will schedule Lasix renogram to assess for obstruction.  She may subsequently need cystoscopy with right retrograde pyelogram and possible right ureteroscopy.   Abbie Sons, [place] [address] [place], [place] [address] [phone]

## 2019-04-10 ENCOUNTER — Encounter: Payer: Self-pay | Admitting: Urology

## 2019-04-19 ENCOUNTER — Telehealth: Payer: Self-pay | Admitting: Urology

## 2019-04-19 NOTE — Telephone Encounter (Signed)
You ordered a lasik renal study on this patient but I can't see where she was ever told that she needed this. When [name] [name] called her to schedule it she was confused and said she didn't know anything about it. Can you verify if she has been contacted?   [name]

## 2019-04-19 NOTE — Telephone Encounter (Signed)
I explained during the office visit that she had new onset hydronephrosis and recommended this study.  If she has any questions I will be happy to talk to her again

## 2019-04-20 NOTE — Telephone Encounter (Signed)
LM for patient to call back   [name] [name]

## 2019-04-27 NOTE — Telephone Encounter (Signed)
Spoke with the patient and she is now in agreement to schedule and now understands why she needs the test. I told her that i would have Manuela Schwartz in scheduling call her.   [name]

## 2019-04-27 NOTE — Telephone Encounter (Signed)
Pt called back wanting to talk to you about lasik renal study.

## 2019-04-29 DIAGNOSIS — J449 Chronic obstructive pulmonary disease, unspecified: Secondary | ICD-10-CM | POA: Diagnosis not present

## 2019-05-05 ENCOUNTER — Other Ambulatory Visit: Payer: Self-pay

## 2019-05-05 ENCOUNTER — Other Ambulatory Visit: Payer: Self-pay | Admitting: Physician Assistant

## 2019-05-05 ENCOUNTER — Ambulatory Visit
Admission: RE | Admit: 2019-05-05 | Discharge: 2019-05-05 | Disposition: A | Discharge status: Home / Self Care | Payer: Medicare Other | Source: Ambulatory Visit | Attending: Urology | Admitting: Urology

## 2019-05-05 DIAGNOSIS — N1330 Unspecified hydronephrosis: Secondary | ICD-10-CM | POA: Insufficient documentation

## 2019-05-05 DIAGNOSIS — S22000A Wedge compression fracture of unspecified thoracic vertebra, initial encounter for closed fracture: Secondary | ICD-10-CM

## 2019-05-05 MED ORDER — TECHNETIUM TC 99M MERTIATIDE
5.0000 | Freq: Once | INTRAVENOUS | Status: AC | PRN
  Administered 2019-05-05: 5.5 via INTRAVENOUS

## 2019-05-05 MED ORDER — FUROSEMIDE 10 MG/ML IJ SOLN
20.0000 mg | Freq: Once | INTRAMUSCULAR | Status: DC
  Filled 2019-05-05: qty 2

## 2019-05-05 MED ORDER — HYDROCODONE-ACETAMINOPHEN 5-325 MG PO TABS: 1.0000 | ORAL_TABLET | Freq: Four times a day (QID) | ORAL | 0 refills | Status: DC | PRN

## 2019-05-05 NOTE — Telephone Encounter (Signed)
HYDROcodone-acetaminophen (NORCO/VICODIN) 5-325 MG tablet Pt is needing the refill.  Please fill at: [facility] [place], [place] [phone] (Phone) [phone] (Fax)   Thanks, [employer]

## 2019-05-10 ENCOUNTER — Encounter: Payer: Self-pay | Admitting: Urology

## 2019-05-10 ENCOUNTER — Other Ambulatory Visit: Payer: Self-pay | Admitting: Family Medicine

## 2019-05-10 ENCOUNTER — Other Ambulatory Visit: Payer: Self-pay | Admitting: Physician Assistant

## 2019-05-10 ENCOUNTER — Other Ambulatory Visit: Payer: Self-pay | Admitting: Urology

## 2019-05-10 DIAGNOSIS — N281 Cyst of kidney, acquired: Secondary | ICD-10-CM

## 2019-05-10 DIAGNOSIS — I10 Essential (primary) hypertension: Secondary | ICD-10-CM

## 2019-05-10 NOTE — Telephone Encounter (Signed)
L.O.V. was 02/05/2019.

## 2019-05-15 IMAGING — US US RENAL
1 series · 14 of 25 positions shown · non-contrast
Comparison: CT 10/18/2017

CLINICAL DATA: Renal cyst noted on CT

EXAM:
RENAL / URINARY TRACT ULTRASOUND COMPLETE

[Series 1: us renal · 0.23mm/px · 14 of 45 slices shown]
[im 1/45]
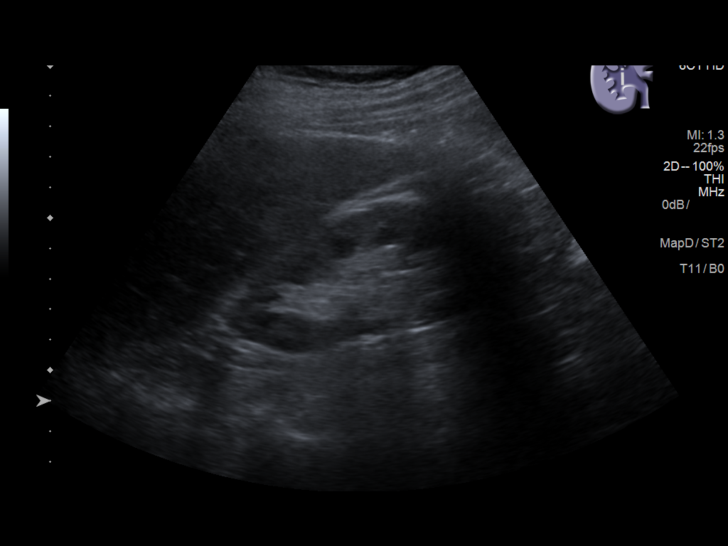
[im 4/45]
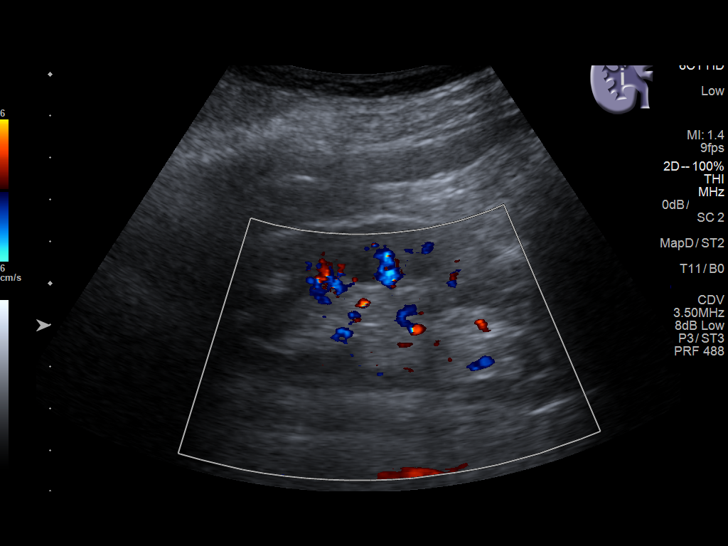
[im 8/45]
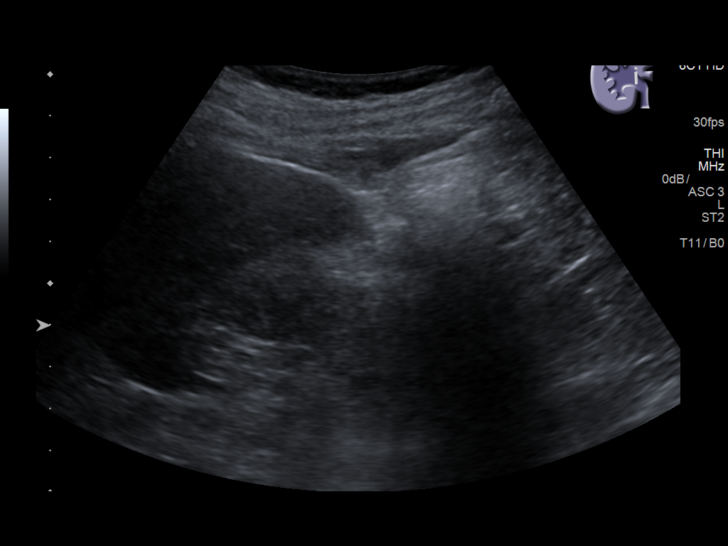
[im 12/45]
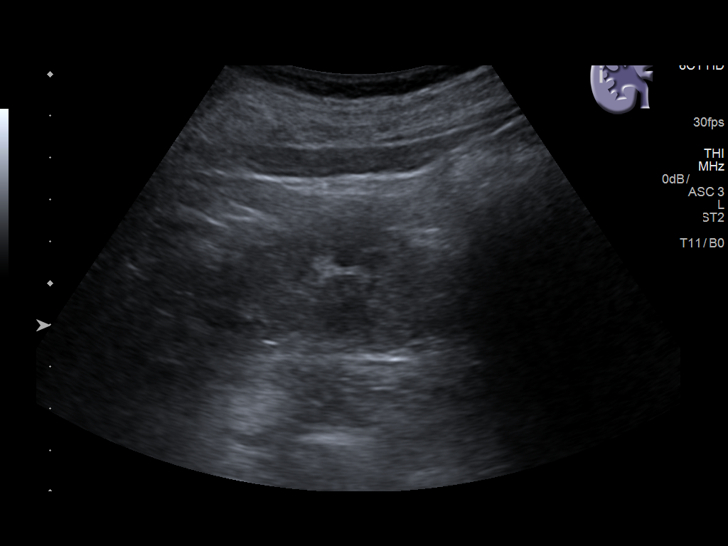
[im 15/45]
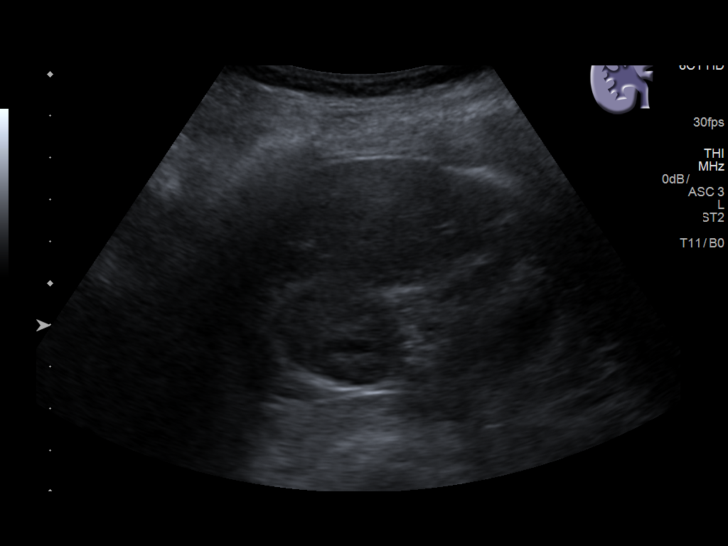
[im 17/45]
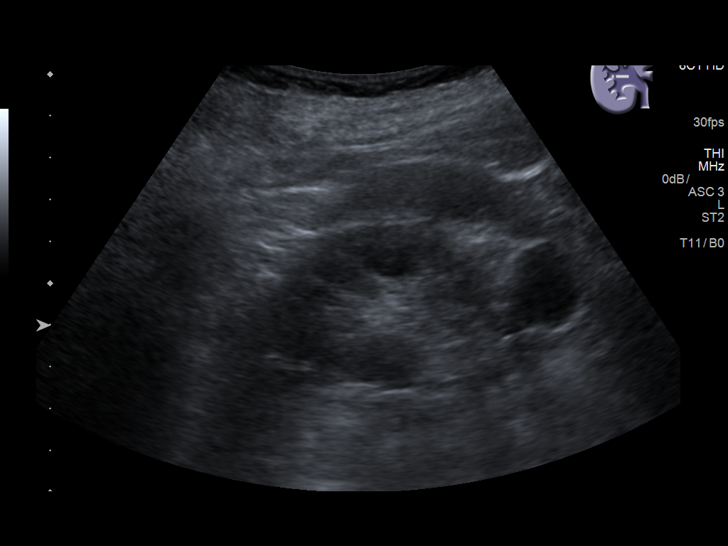
[im 21/45]
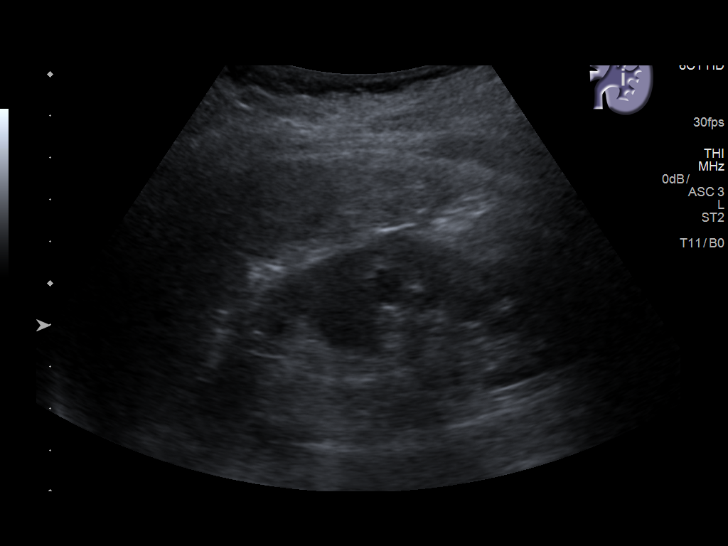
[im 24/45]
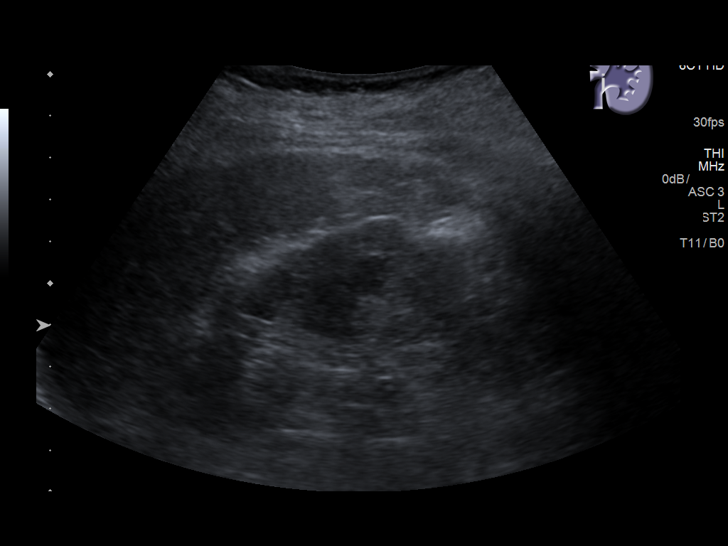
[im 28/45]
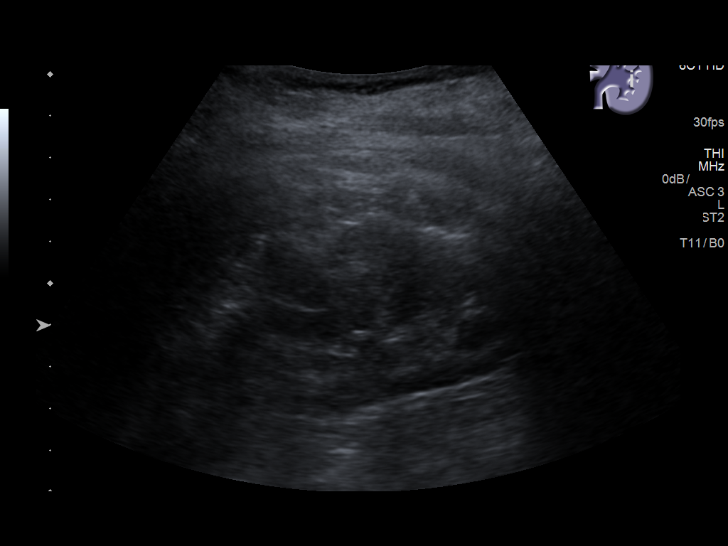
[im 30/45]
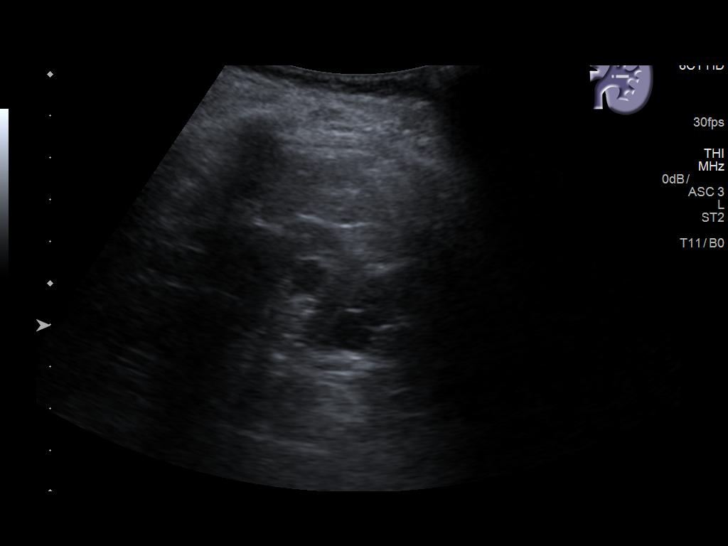
[im 34/45]
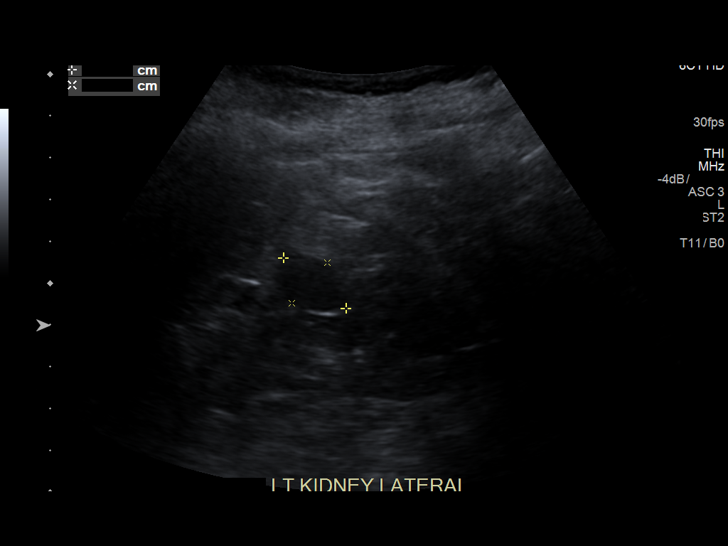
[im 37/45]
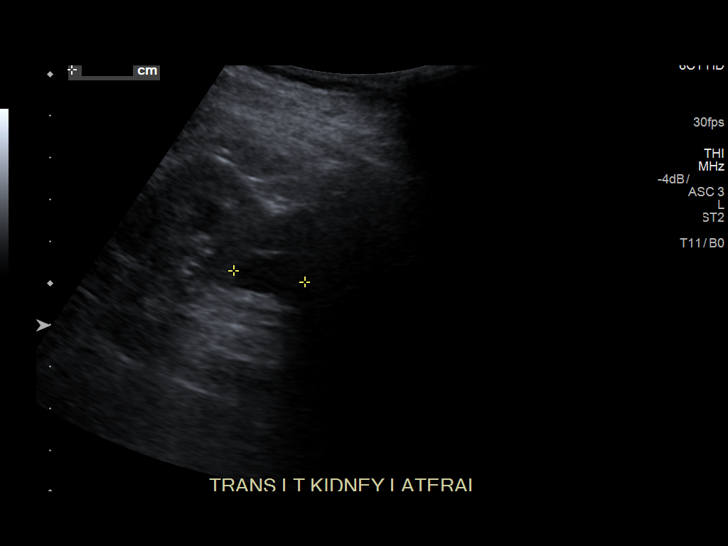
[im 41/45]
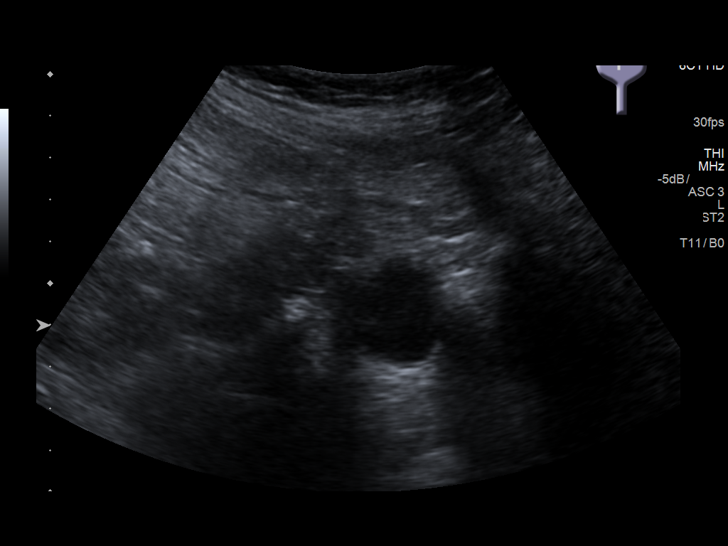
[im 45/45]
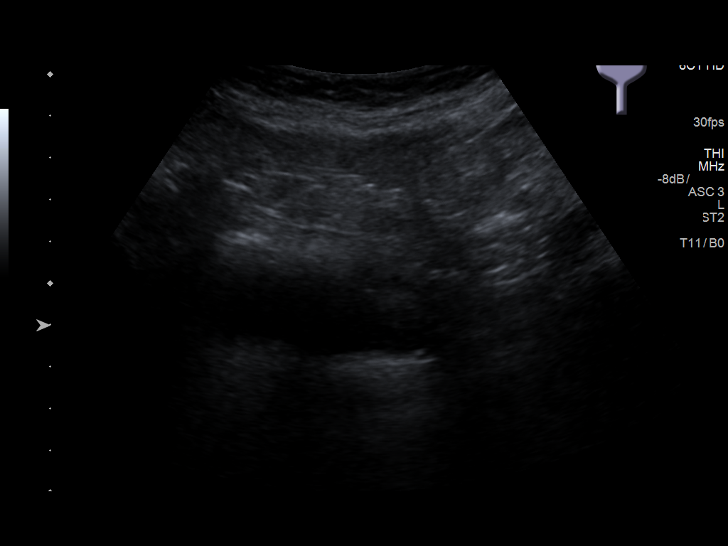

[14 of 25 positions shown; findings below may reference images not displayed]

FINDINGS: Right Kidney:

Length: 10.0 cm. Multiple simple appearing cysts, the largest 11 mm
in the midpole. No hydronephrosis or suspicious mass. Normal
echotexture.

Left Kidney:

Length: 9.0 cm. Multiple cysts, the largest 1.9 cm in the mid to
upper pole. These appear simple/benign. No hydronephrosis. Normal
echotexture.

Bladder:

Appears normal for degree of bladder distention.
IMPRESSION: Multiple bilateral benign-appearing cysts.  No hydronephrosis.

## 2019-05-28 IMAGING — CR DG CHEST 2V
1 series · 2 of 2 positions shown · non-contrast
Comparison: Chest x-ray dated February 09, 2015.

CLINICAL DATA: Cough.  Intermittent low-grade fever.

EXAM:
CHEST - 2 VIEW

[Series 1: w chest pa · 0.14mm/px · 2 of 2 slices shown]
[im 1/2]
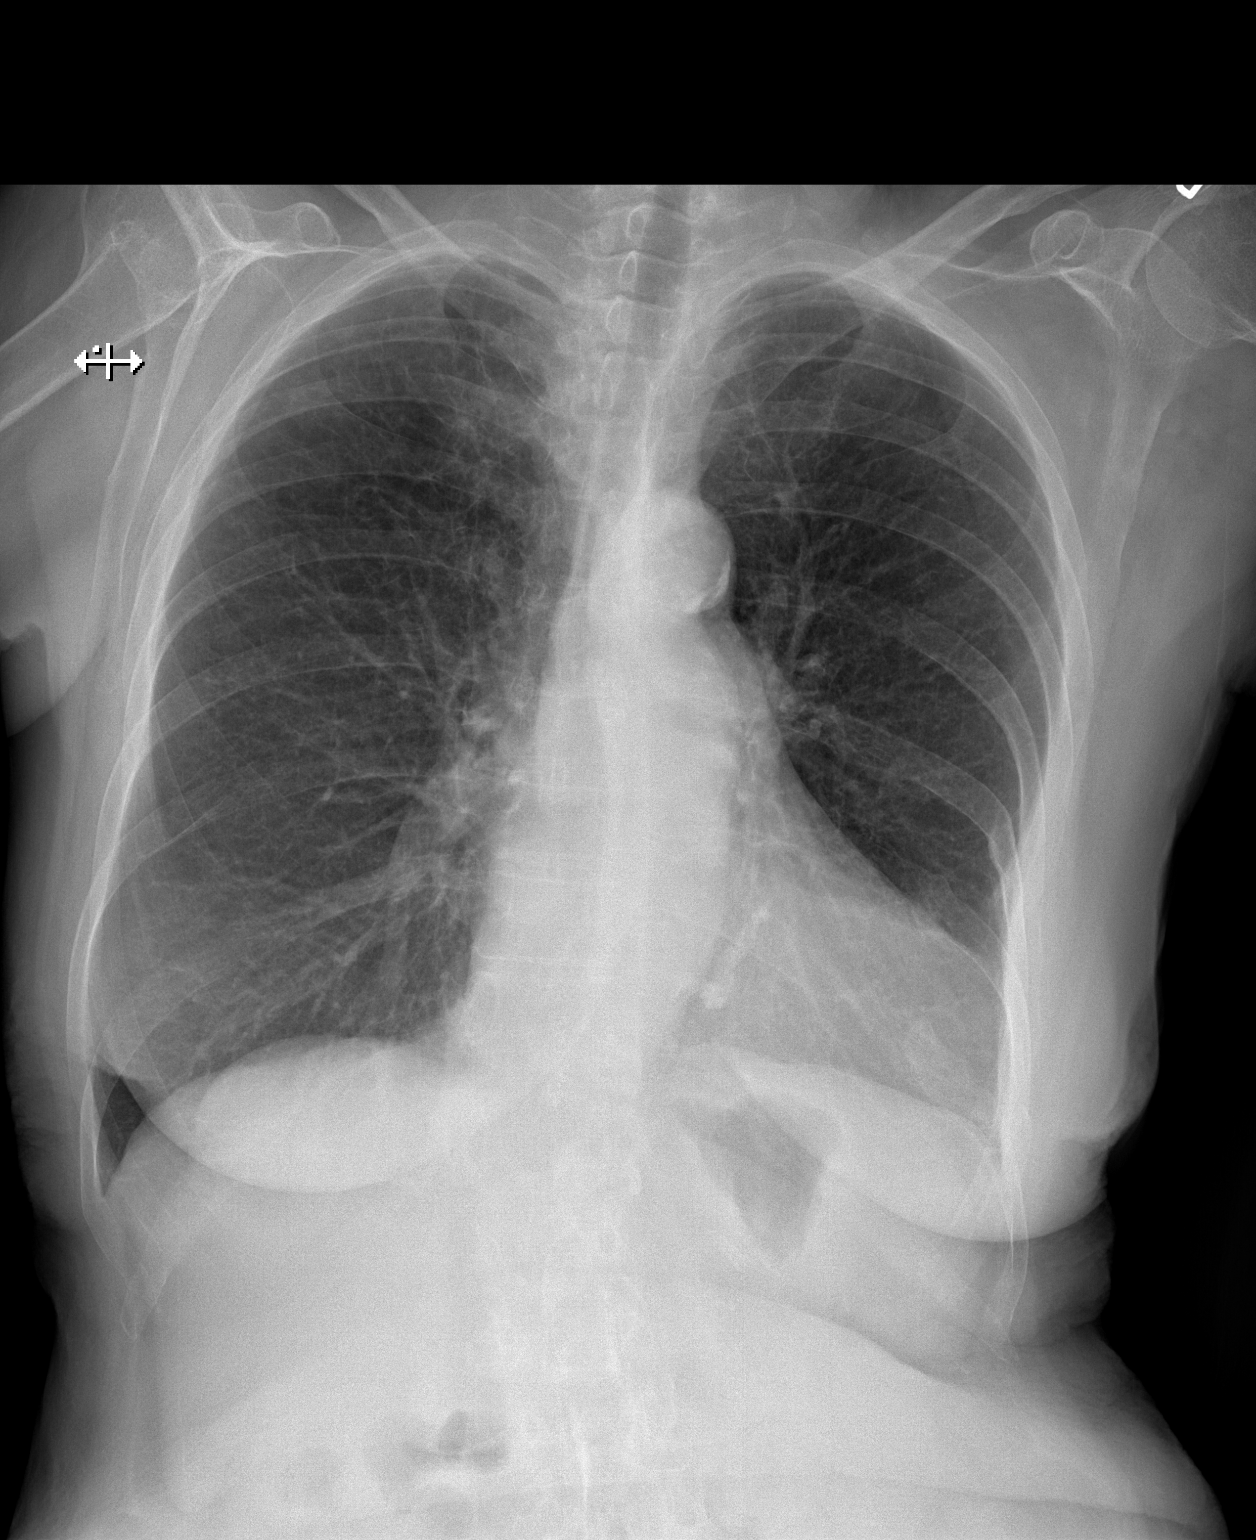
[im 2/2]
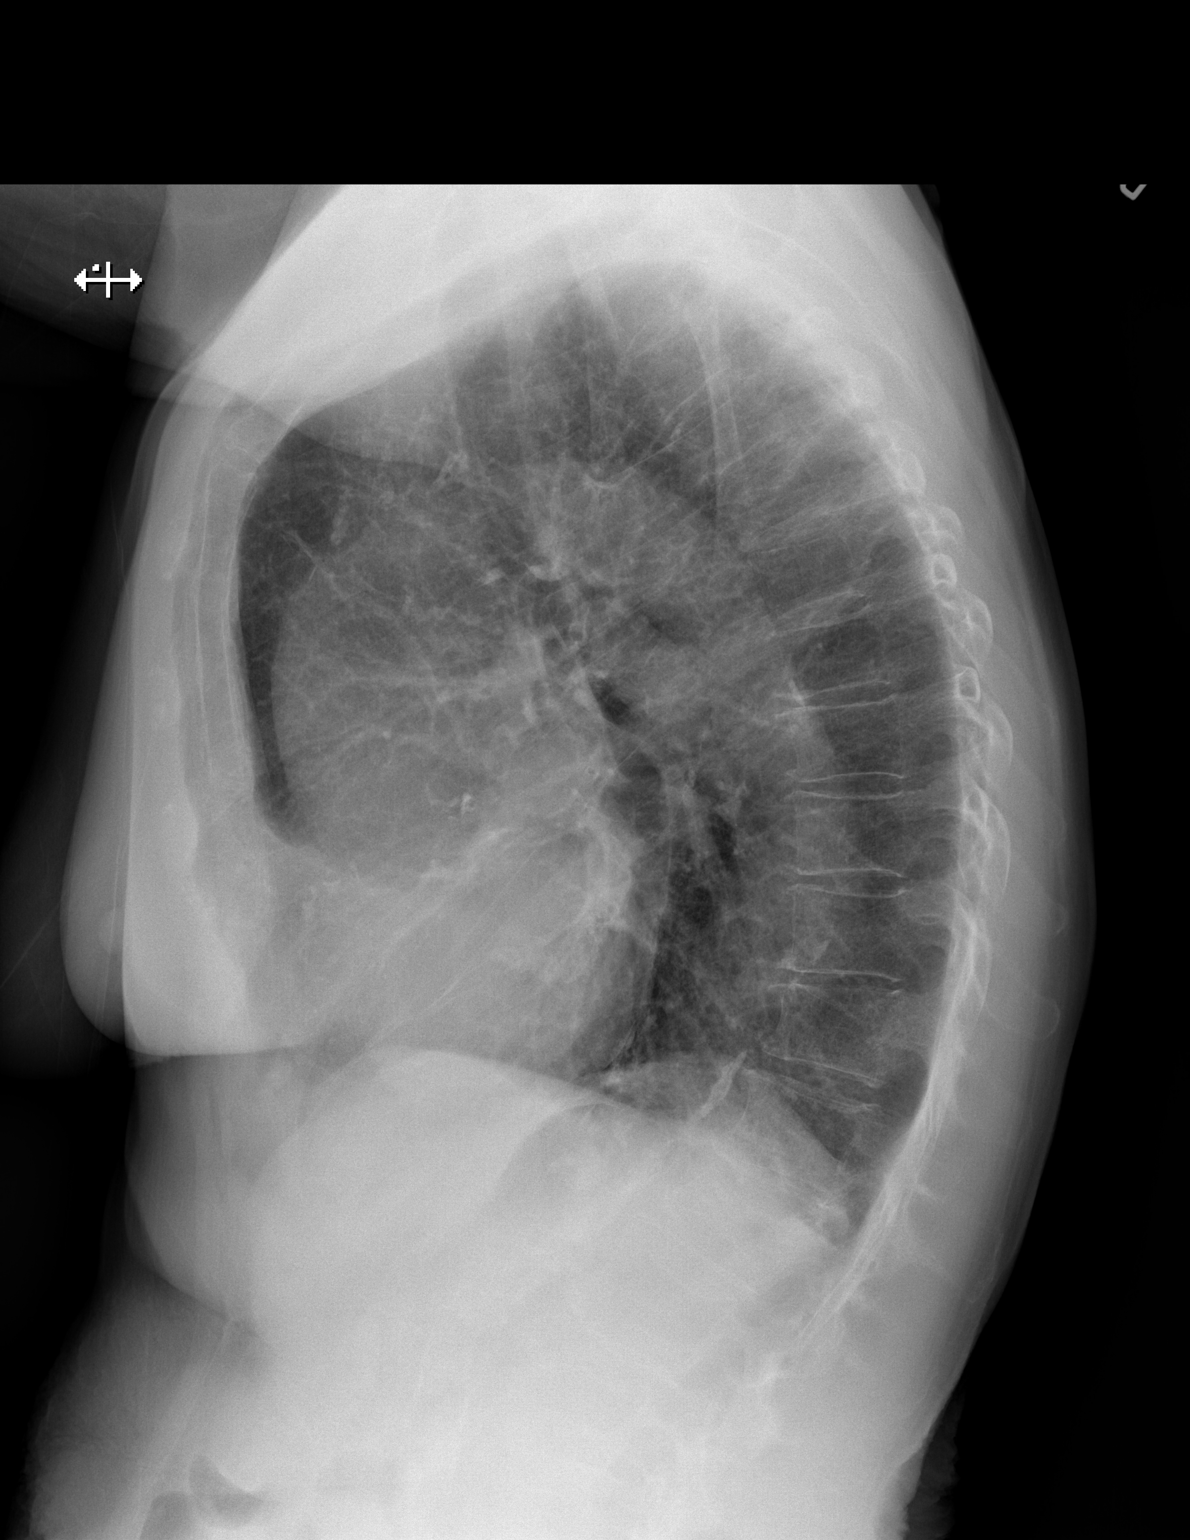

[2 of 2 positions shown; findings below may reference images not displayed]

FINDINGS: Borderline cardiomegaly, unchanged. Normal pulmonary vascularity.
Atherosclerotic calcification of the aortic arch. The lungs remain
mildly hyperinflated. No focal consolidation, pleural effusion, or
pneumothorax. No acute osseous abnormality. Old left-sided rib
fractures.
IMPRESSION: COPD.  No active cardiopulmonary disease.

## 2019-05-30 DIAGNOSIS — J449 Chronic obstructive pulmonary disease, unspecified: Secondary | ICD-10-CM | POA: Diagnosis not present

## 2019-06-02 IMAGING — XA DG LUMBAR SPINE 2-3V
1 series · 1 of 1 positions shown · non-contrast
Comparison: 10/19/2017.

CLINICAL DATA: Kyphoplasty.

EXAM:
DG C-ARM 61-120 MIN; LUMBAR SPINE - 2-3 VIEW

[Series 1: ortho standard · 1 of 1 slices shown]
[im 1/1]
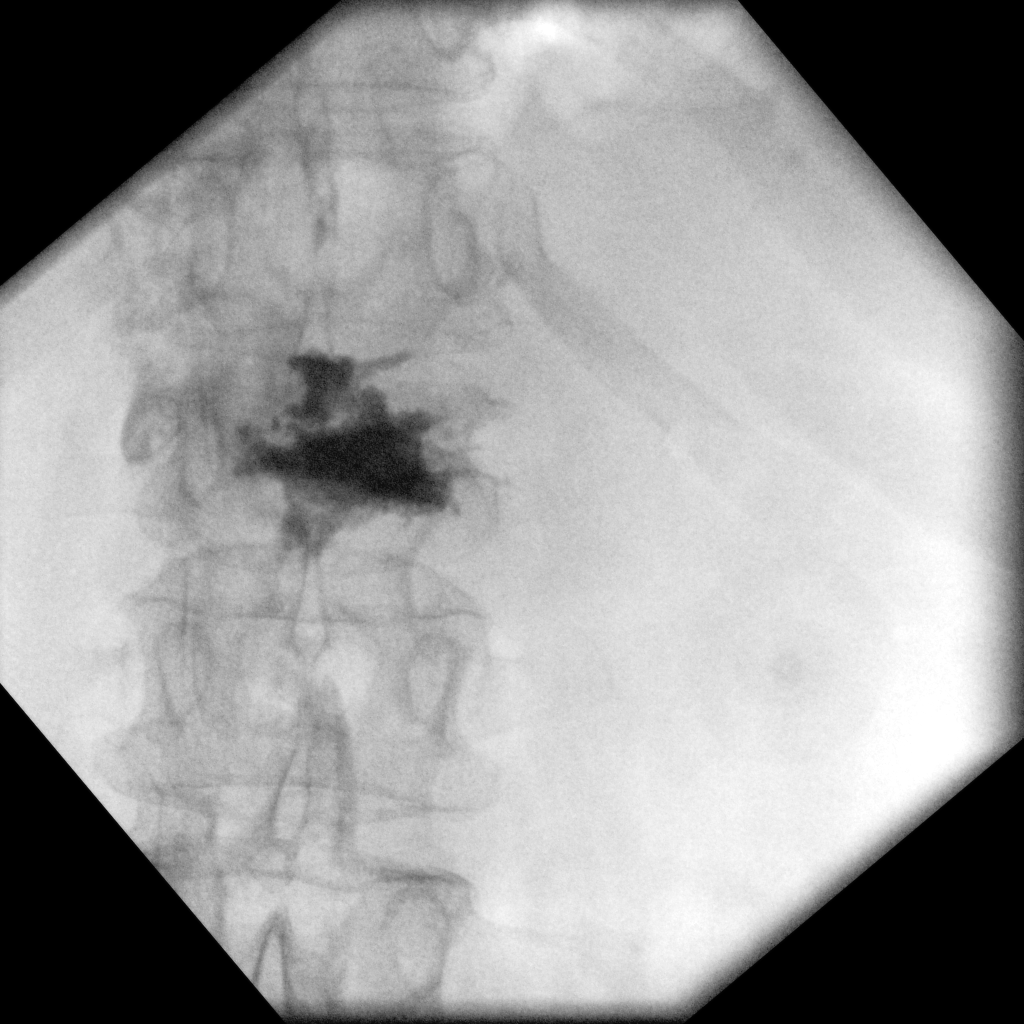

[1 of 1 positions shown; findings below may reference images not displayed]

FINDINGS: Kyphoplasty upper lumbar vertebral body. Methylmethacrylate noted
within the body and adjacent disc space. One image obtained. 1
minutes 55 seconds fluoroscopy time.
IMPRESSION: Kyphoplasty upper lumbar vertebral body.

## 2019-06-09 ENCOUNTER — Other Ambulatory Visit: Payer: Self-pay

## 2019-06-09 DIAGNOSIS — F411 Generalized anxiety disorder: Secondary | ICD-10-CM

## 2019-06-09 DIAGNOSIS — S22000A Wedge compression fracture of unspecified thoracic vertebra, initial encounter for closed fracture: Secondary | ICD-10-CM

## 2019-06-09 MED ORDER — ALPRAZOLAM 0.5 MG PO TABS: 0.5000 mg | ORAL_TABLET | Freq: Two times a day (BID) | ORAL | 5 refills | Status: DC | PRN

## 2019-06-09 MED ORDER — HYDROCODONE-ACETAMINOPHEN 5-325 MG PO TABS: 1.0000 | ORAL_TABLET | Freq: Four times a day (QID) | ORAL | 0 refills | Status: DC | PRN

## 2019-06-29 DIAGNOSIS — J449 Chronic obstructive pulmonary disease, unspecified: Secondary | ICD-10-CM | POA: Diagnosis not present

## 2019-07-12 ENCOUNTER — Other Ambulatory Visit: Payer: Self-pay | Admitting: Physician Assistant

## 2019-07-12 DIAGNOSIS — S22000A Wedge compression fracture of unspecified thoracic vertebra, initial encounter for closed fracture: Secondary | ICD-10-CM

## 2019-07-12 MED ORDER — HYDROCODONE-ACETAMINOPHEN 5-325 MG PO TABS: 1.0000 | ORAL_TABLET | Freq: Four times a day (QID) | ORAL | 0 refills | Status: DC | PRN

## 2019-07-12 NOTE — Telephone Encounter (Signed)
Requested medication (s) are due for refill today: yes  Requested medication (s) are on the active medication list: yes  Last refill:  06/09/2019  Future visit scheduled: no  Notes to clinic:  refill cannot be delegated   Requested Prescriptions  Pending Prescriptions Disp Refills   HYDROcodone-acetaminophen (NORCO/VICODIN) 5-325 MG tablet 120 tablet 0    Sig: Take 1 tablet by mouth every 6 (six) hours as needed for moderate pain or severe pain.      Not Delegated - Analgesics:  Opioid Agonist Combinations Failed - 07/12/2019 10:40 AM      Failed - This refill cannot be delegated      Failed - Urine Drug Screen completed in last 360 days.      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

## 2019-07-12 NOTE — Telephone Encounter (Signed)
Copied from [place] [id number]. Topic: Quick Communication - Rx Refill/Question >> Jul 12, 2019 10:27 AM Yvette Rack wrote: Medication: HYDROcodone-acetaminophen (NORCO/VICODIN) 5-325 MG tablet   Has the patient contacted their pharmacy? no  Preferred Pharmacy (with phone number or street name): [facility] [place], [place] Phone:  [phone]  Fax:  [phone]   Agent: Please be advised that RX refills may take up to 3 business days. We ask that you follow-up with your pharmacy.

## 2019-07-30 DIAGNOSIS — J449 Chronic obstructive pulmonary disease, unspecified: Secondary | ICD-10-CM | POA: Diagnosis not present

## 2019-08-13 ENCOUNTER — Other Ambulatory Visit: Payer: Self-pay | Admitting: Physician Assistant

## 2019-08-13 DIAGNOSIS — S22000A Wedge compression fracture of unspecified thoracic vertebra, initial encounter for closed fracture: Secondary | ICD-10-CM

## 2019-08-13 MED ORDER — HYDROCODONE-ACETAMINOPHEN 5-325 MG PO TABS: 1.0000 | ORAL_TABLET | Freq: Four times a day (QID) | ORAL | 0 refills | Status: DC | PRN

## 2019-08-13 NOTE — Telephone Encounter (Signed)
Requested medication (s) are due for refill today:yes  Requested medication (s) are on the active medication list: {yes  Last refill:  07/12/2019  Future visit scheduled: no  Notes to clinic:  not delegated    Requested Prescriptions  Pending Prescriptions Disp Refills   HYDROcodone-acetaminophen (NORCO/VICODIN) 5-325 MG tablet 120 tablet 0    Sig: Take 1 tablet by mouth every 6 (six) hours as needed for moderate pain or severe pain.      Not Delegated - Analgesics:  Opioid Agonist Combinations Failed - 08/13/2019  2:41 PM      Failed - This refill cannot be delegated      Failed - Urine Drug Screen completed in last 360 days.      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

## 2019-08-13 NOTE — Telephone Encounter (Signed)
Pt request refill  HYDROcodone-acetaminophen (NORCO/VICODIN) 5-325 MG tablet  [facility] [id number] - [address], [place] AT [facility] [address] [place] Phone:  [phone]  Fax:  [phone]

## 2019-08-15 ENCOUNTER — Other Ambulatory Visit: Payer: Self-pay | Admitting: Physician Assistant

## 2019-08-15 DIAGNOSIS — E7800 Pure hypercholesterolemia, unspecified: Secondary | ICD-10-CM

## 2019-08-15 DIAGNOSIS — F411 Generalized anxiety disorder: Secondary | ICD-10-CM

## 2019-08-30 DIAGNOSIS — J449 Chronic obstructive pulmonary disease, unspecified: Secondary | ICD-10-CM | POA: Diagnosis not present

## 2019-09-09 ENCOUNTER — Other Ambulatory Visit: Payer: Self-pay | Admitting: Physician Assistant

## 2019-09-09 DIAGNOSIS — S22000A Wedge compression fracture of unspecified thoracic vertebra, initial encounter for closed fracture: Secondary | ICD-10-CM

## 2019-09-09 MED ORDER — HYDROCODONE-ACETAMINOPHEN 5-325 MG PO TABS: 1.0000 | ORAL_TABLET | Freq: Four times a day (QID) | ORAL | 0 refills | Status: DC | PRN

## 2019-09-09 NOTE — Telephone Encounter (Signed)
Requested medication (s) are due for refill today: yes  Requested medication (s) are on the active medication list: yes  Last refill:  08/13/19  Future visit scheduled: no  Notes to clinic:  not delegated    Requested Prescriptions  Pending Prescriptions Disp Refills   HYDROcodone-acetaminophen (NORCO/VICODIN) 5-325 MG tablet 120 tablet 0    Sig: Take 1 tablet by mouth every 6 (six) hours as needed for moderate pain or severe pain.      Not Delegated - Analgesics:  Opioid Agonist Combinations Failed - 09/09/2019 11:53 AM      Failed - This refill cannot be delegated      Failed - Urine Drug Screen completed in last 360 days.      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

## 2019-09-09 NOTE — Telephone Encounter (Signed)
Copied from [place] [id number]. Topic: Quick Communication - Rx Refill/Question >> Sep 09, 2019 11:51 AM Rainey Pines A wrote: Medication: HYDROcodone-acetaminophen (NORCO/VICODIN) 5-325 MG tablet  Has the patient contacted their pharmacy? Yes (Agent: If no, request that the patient contact the pharmacy for the refill.) (Agent: If yes, when and what did the pharmacy advise?)Contact PCP  Preferred Pharmacy (with phone number or street name): [facility] [id number], [place]  Phone:  [phone] Fax:  [phone]     Agent: Please be advised that RX refills may take up to 3 business days. We ask that you follow-up with your pharmacy.

## 2019-09-10 ENCOUNTER — Other Ambulatory Visit: Payer: Self-pay | Admitting: Physician Assistant

## 2019-09-10 DIAGNOSIS — I10 Essential (primary) hypertension: Secondary | ICD-10-CM

## 2019-09-10 DIAGNOSIS — F411 Generalized anxiety disorder: Secondary | ICD-10-CM

## 2019-09-10 NOTE — Telephone Encounter (Signed)
Requested medication (s) are due for refill today:   Yes  Requested medication (s) are on the active medication list:   Yes  Future visit scheduled:   No   Last ordered: 05/10/2019  #90  1 refill Failed protocol.   Sent to provider for further disposition.   Requested Prescriptions  Pending Prescriptions Disp Refills   lisinopril (ZESTRIL) 40 MG tablet [Pharmacy Med Name: LISINOPRIL 40MG  TABLETS] 90 tablet 1    Sig: TAKE 1 TABLET(40 MG) BY MOUTH DAILY      Cardiovascular:  ACE Inhibitors Failed - 09/10/2019  1:27 PM      Failed - Cr in normal range and within 180 days    Creat  Date Value Ref Range Status  05/20/2017 0.92 0.60 - 0.93 mg/dL Final    Comment:    For patients >30 years of age, the reference limit for Creatinine is approximately 13% higher for people identified as African-American. .    Creatinine, Ser  Date Value Ref Range Status  04/08/2019 1.20 (H) 0.44 - 1.00 mg/dL Final          Failed - K in normal range and within 180 days    Potassium  Date Value Ref Range Status  01/14/2018 2.9 (L) 3.5 - 5.1 mmol/L Final  11/04/2013 4.0 3.5 - 5.1 mmol/L Final          Failed - Last BP in normal range    BP Readings from Last 1 Encounters:  04/09/19 (!) 147/76          Failed - Valid encounter within last 6 months    Recent Outpatient Visits           7 months ago Therapeutic opioid-induced constipation (OIC)   [place], PA-C   1 year ago Age-related osteoporosis with current pathological fracture with routine healing, subsequent encounter   [place], Clearnce Sorrel, [place]   1 year ago 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

## 2019-09-10 NOTE — Telephone Encounter (Signed)
Requested medication (s) are due for refill today:  Yes  Requested medication (s) are on the active medication list:   Yes  Future visit scheduled:   No   Last ordered: 1 month ago.    30 day courtesy supply was given in Jan.  OV overdue  Requested Prescriptions  Pending Prescriptions Disp Refills   citalopram (CELEXA) 20 MG tablet [Pharmacy Med Name: CITALOPRAM 20MG  TABLETS] 30 tablet 0    Sig: TAKE 1 TABLET(20 MG) BY MOUTH DAILY      Psychiatry:  Antidepressants - SSRI Failed - 09/10/2019  1:04 PM      Failed - Valid encounter within last 6 months    Recent Outpatient Visits           7 months ago Therapeutic opioid-induced constipation (OIC)   [facility] [place], Clearn[place] Sorrel, PA-C   1 year ago Age-related osteoporosis with current pathological fracture with routine healing, subsequent encounter   [place], Clearnce Sorrel, PA-C   1 year ago 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

## 2019-09-14 IMAGING — CR DG THORACIC SPINE 3V
1 series · 4 of 4 positions shown · non-contrast
Comparison: Chest x-ray 01/14/2018

CLINICAL DATA: Fell.  Pain.

EXAM:
THORACIC SPINE - 3 VIEWS

[Series 1: dg thoracic spine w/swimmers · 0.14mm/px · 4 of 4 slices shown]
[im 1/4]
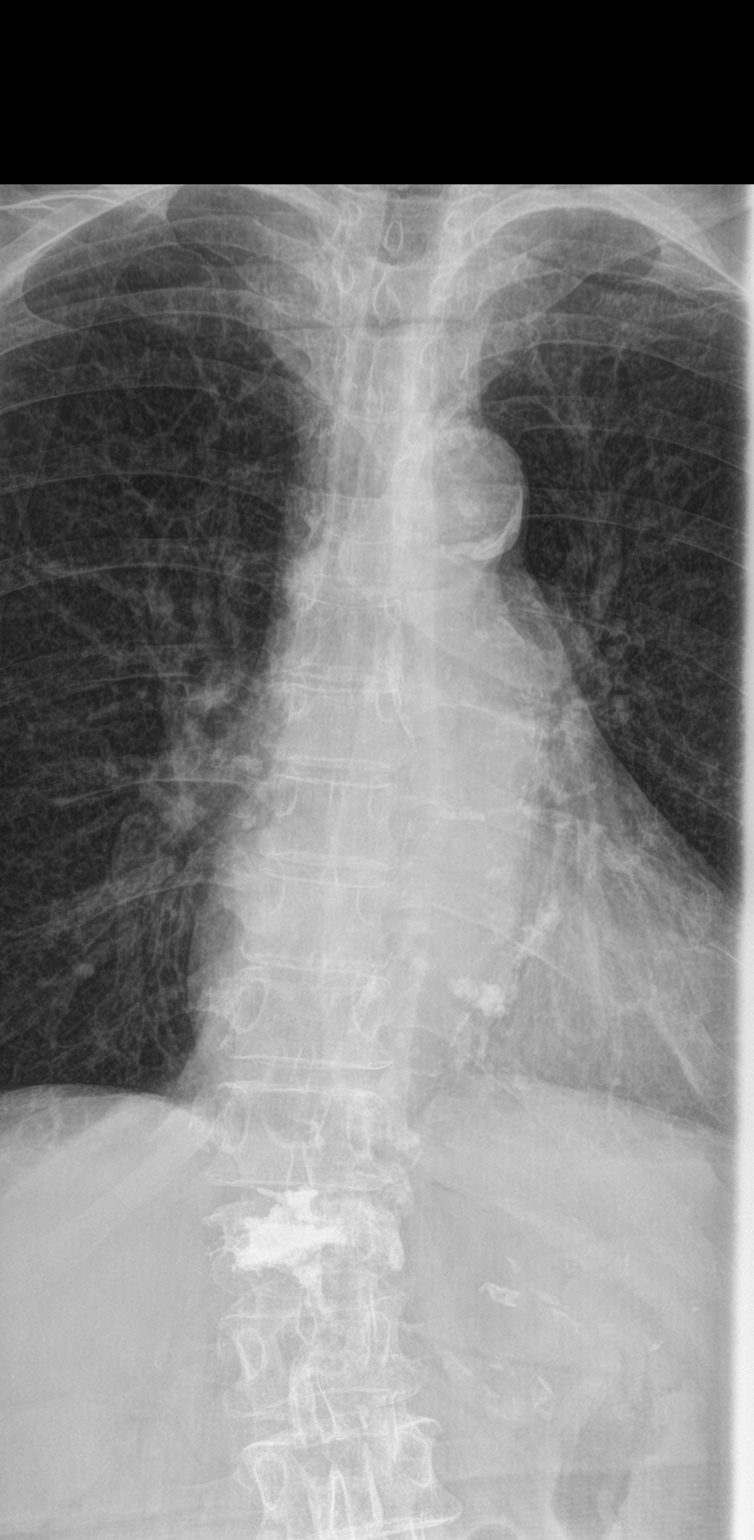
[im 2/4]
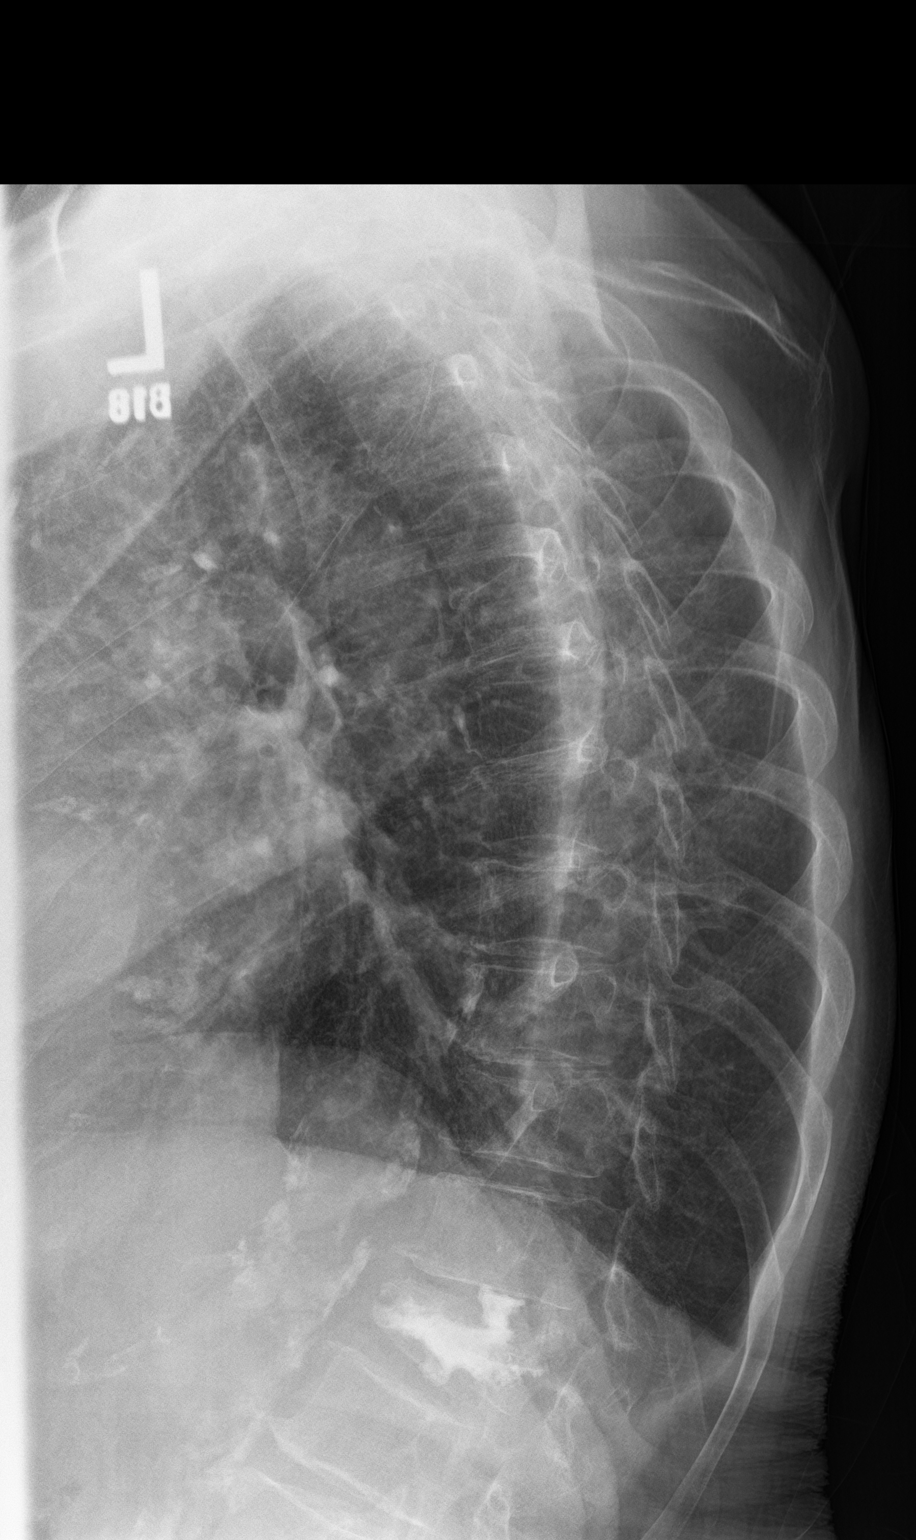
[im 3/4]
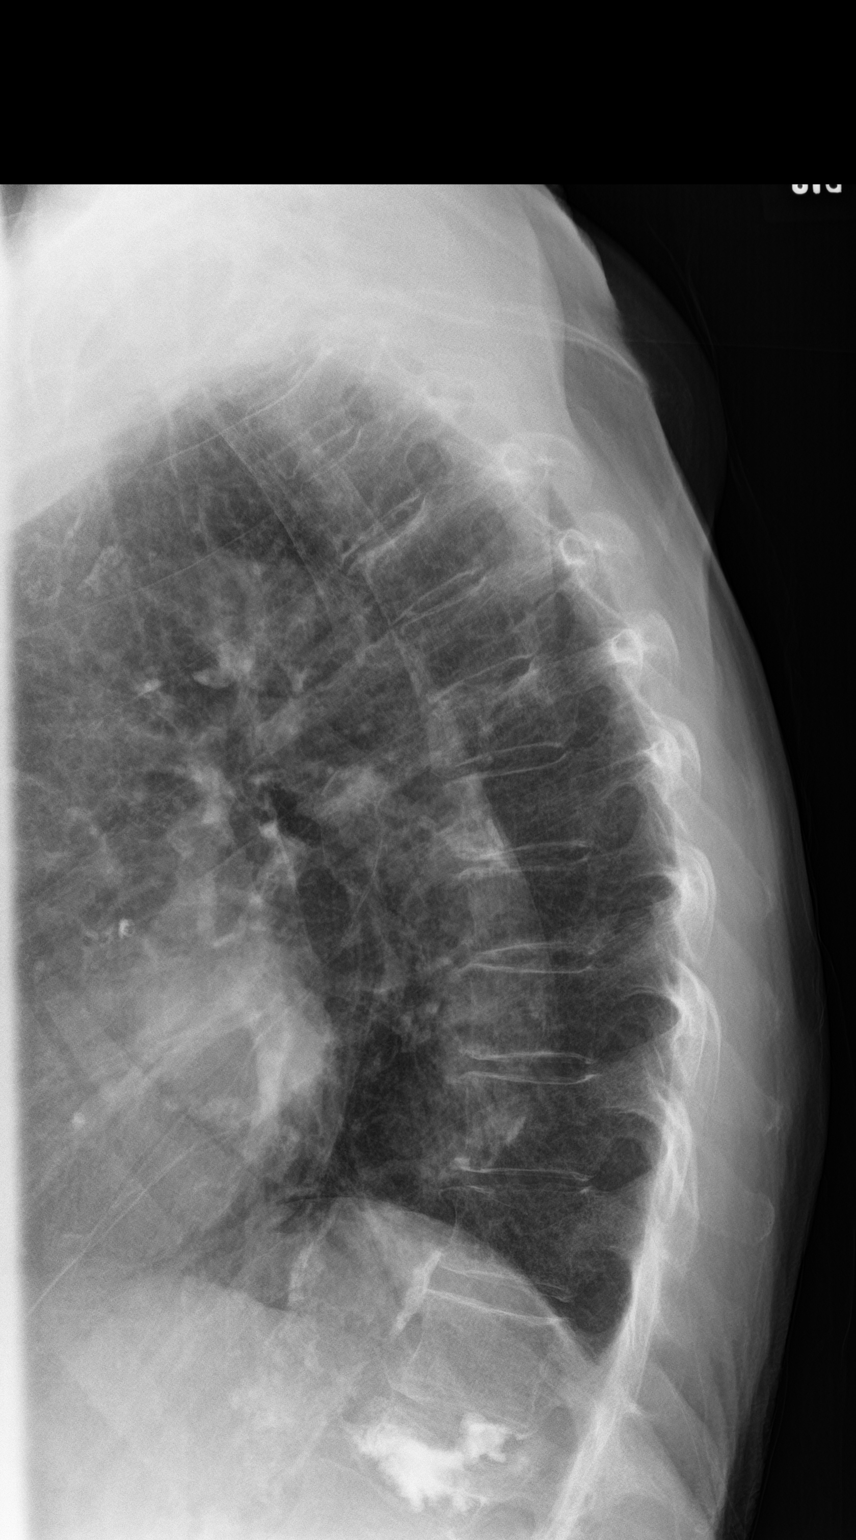
[im 4/4]
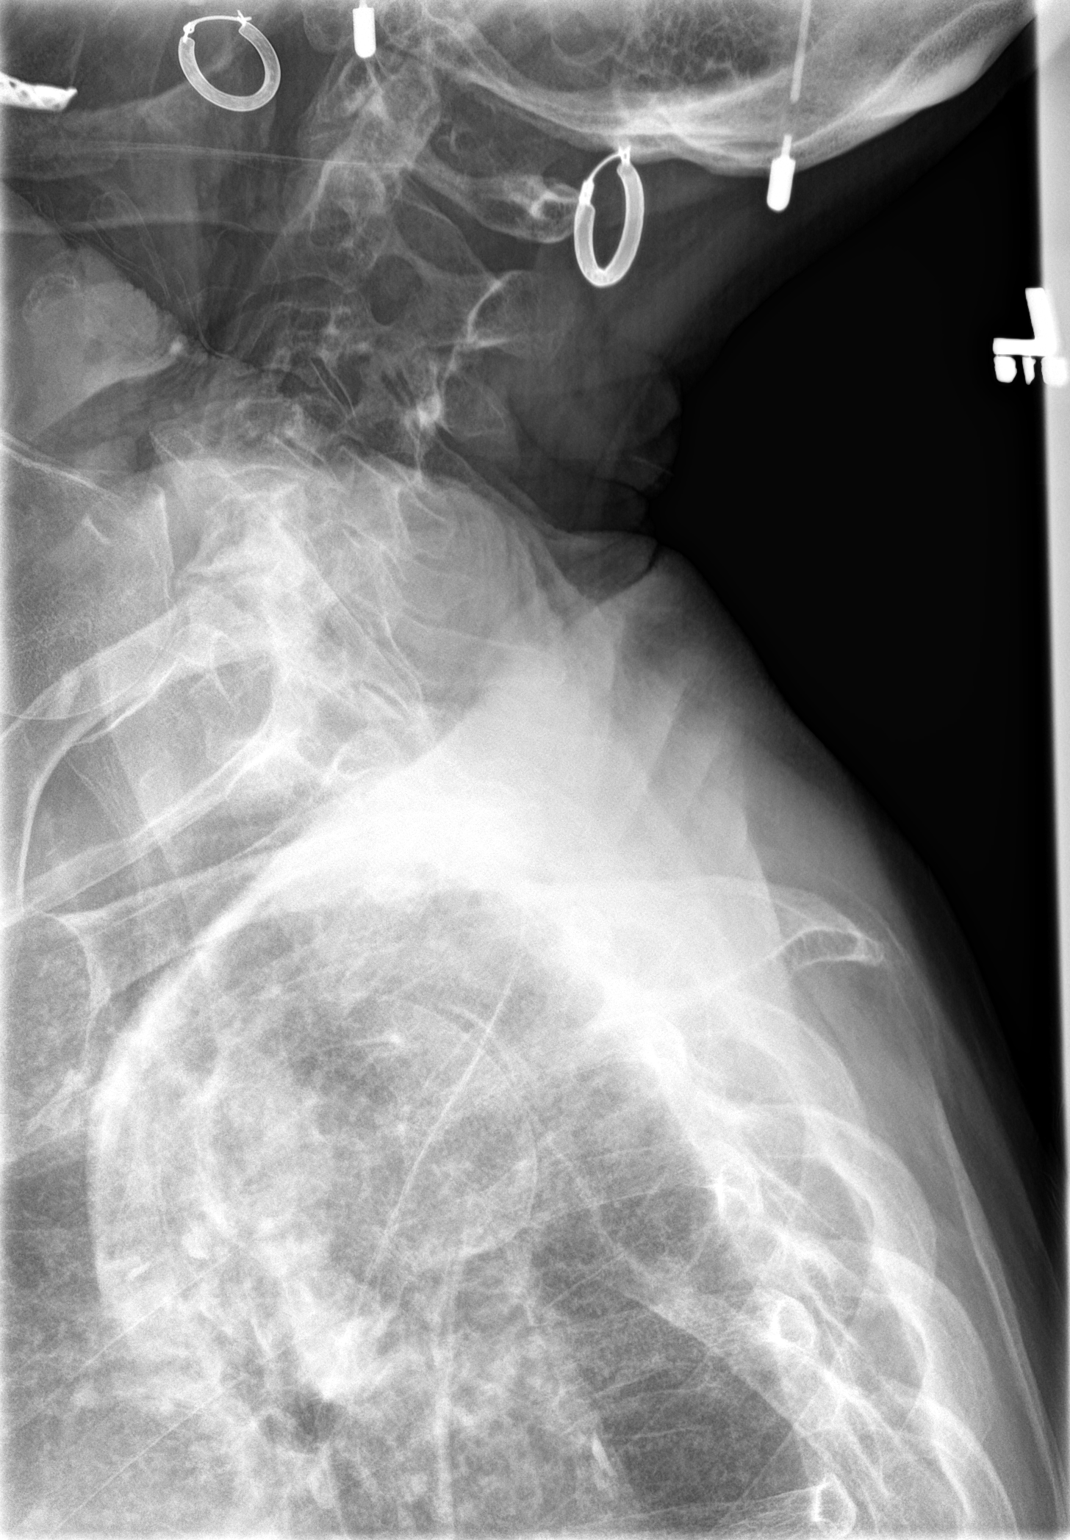

[4 of 4 positions shown; findings below may reference images not displayed]

FINDINGS: Minor endplate irregularities at T4 and T6 are redemonstrated from
recent chest radiograph 01/14/2018.Chronic compression fracture of
L1, prior vertebral augmentation with extra osseous extension of
cement to the T12-L1 interspace. Aortic atherosclerosis. No acute
findings.
IMPRESSION: No visible acute findings.  Chronic changes as described.

If further investigation desired, and no contraindications, MRI of
the thoracic spine is recommended.

## 2019-09-27 DIAGNOSIS — J449 Chronic obstructive pulmonary disease, unspecified: Secondary | ICD-10-CM | POA: Diagnosis not present

## 2019-09-27 DIAGNOSIS — Z1283 Encounter for screening for malignant neoplasm of skin: Secondary | ICD-10-CM | POA: Diagnosis not present

## 2019-09-27 DIAGNOSIS — L821 Other seborrheic keratosis: Secondary | ICD-10-CM | POA: Diagnosis not present

## 2019-09-27 DIAGNOSIS — L578 Other skin changes due to chronic exposure to nonionizing radiation: Secondary | ICD-10-CM | POA: Diagnosis not present

## 2019-10-18 ENCOUNTER — Encounter: Payer: Self-pay | Admitting: Physician Assistant

## 2019-10-18 ENCOUNTER — Ambulatory Visit (INDEPENDENT_AMBULATORY_CARE_PROVIDER_SITE_OTHER): Payer: Medicare Other | Admitting: Physician Assistant

## 2019-10-18 ENCOUNTER — Other Ambulatory Visit: Payer: Self-pay

## 2019-10-18 DIAGNOSIS — K219 Gastro-esophageal reflux disease without esophagitis: Secondary | ICD-10-CM

## 2019-10-18 DIAGNOSIS — S22000A Wedge compression fracture of unspecified thoracic vertebra, initial encounter for closed fracture: Secondary | ICD-10-CM | POA: Diagnosis not present

## 2019-10-18 DIAGNOSIS — N281 Cyst of kidney, acquired: Secondary | ICD-10-CM

## 2019-10-18 MED ORDER — PANTOPRAZOLE SODIUM 20 MG PO TBEC: 20.0000 mg | DELAYED_RELEASE_TABLET | Freq: Every day | ORAL | 1 refills | Status: DC

## 2019-10-18 MED ORDER — HYDROCODONE-ACETAMINOPHEN 5-325 MG PO TABS: 1.0000 | ORAL_TABLET | Freq: Four times a day (QID) | ORAL | 0 refills | Status: DC | PRN

## 2019-10-18 NOTE — Progress Notes (Signed)
Patient: [name] [name] Female    DOB: June 23, 1946   74 y.o.   MRN: [id number] Visit Date: 10/20/2019  Today's Provider: Mar Daring, PA-C   No chief complaint on file.  Subjective:    Virtual Visit via Telephone Note  I connected with [name] [name] on 10/20/19 at  6:00 PM EDT by telephone and verified that I am speaking with the correct person using two identifiers.  Location: Patient: Home Provider: [facility]   I discussed the limitations, risks, security and privacy concerns of performing an evaluation and management service by telephone and the availability of in person appointments. I also discussed with the patient that there may be a patient responsible charge related to this service. The patient expressed understanding and agreed to proceed.   HPI  Patient needing refill for Hydrocodone-Acetaminophen. She reports her back pain is stable. She has known compression fractures from osteoporosis. Pain medication is used prn.   She also needs refill on the Pantoprazole. Reports of recent she has started to have increasing abdominal pain, bloating, nausea, gets full quickly and has had some weight loss. She reports personal history of H. Pylori infection and states it feels similar to that. Also has history of stomach ulcer as well and reports symptoms are similar. She denies any vomiting.   No Known Allergies   Current Outpatient Medications:  .  albuterol (VENTOLIN HFA) 108 (90 Base) MCG/ACT inhaler, Inhale 2 puffs into the lungs every 4 (four) hours as needed for wheezing or shortness of breath., Disp: 18 g, Rfl: 5 .  ALPRAZolam (XANAX) 0.5 MG tablet, Take 1 tablet (0.5 mg total) by mouth 2 (two) times daily as needed for anxiety., Disp: 60 tablet, Rfl: 5 .  aspirin 81 MG tablet, Take 81 mg by mouth daily., Disp: , Rfl:  .  citalopram (CELEXA) 20 MG tablet, TAKE 1 TABLET(20 MG) BY MOUTH DAILY, Disp: 90 tablet, Rfl: 0 .  Docusate Sodium (COLACE PO), Take 100 mg by mouth  daily as needed (constipation). , Disp: , Rfl:  .  furosemide (LASIX) 20 MG tablet, Take 20 mg by mouth daily as needed for fluid. , Disp: , Rfl:  .  HYDROcodone-acetaminophen (NORCO/VICODIN) 5-325 MG tablet, Take 1 tablet by mouth every 6 (six) hours as needed for moderate pain or severe pain., Disp: 120 tablet, Rfl: 0 .  ibuprofen (ADVIL,MOTRIN) 600 MG tablet, Take 600 mg by mouth every 8 (eight) hours as needed., Disp: , Rfl:  .  lisinopril (ZESTRIL) 40 MG tablet, TAKE 1 TABLET(40 MG) BY MOUTH DAILY, Disp: 90 tablet, Rfl: 1 .  metoprolol succinate (TOPROL-XL) 25 MG 24 hr tablet, TAKE 1 TABLET BY MOUTH DAILY( DOSE CHANGE TO 25 MG), Disp: 90 tablet, Rfl: 1 .  pantoprazole (PROTONIX) 20 MG tablet, Take 1 tablet (20 mg total) by mouth daily., Disp: 90 tablet, Rfl: 1 .  potassium chloride SA (K-DUR,KLOR-CON) 20 MEQ tablet, Take 1 tablet (20 mEq total) by mouth daily., Disp: 90 tablet, Rfl: 1 .  simvastatin (ZOCOR) 10 MG tablet, TAKE 1 TABLET(10 MG) BY MOUTH DAILY, Disp: 90 tablet, Rfl: 1 .  Tiotropium Bromide Monohydrate (SPIRIVA RESPIMAT) 1.25 MCG/ACT AERS, Inhale 1 puff into the lungs daily., Disp: 4 g, Rfl: 5 .  mometasone-formoterol (DULERA) 200-5 MCG/ACT AERO, Inhale 2 puffs into the lungs 2 (two) times daily., Disp: , Rfl:   Review of Systems  Constitutional: Negative.   Respiratory: Negative.   Cardiovascular: Negative.   Gastrointestinal: Positive for abdominal  distention, abdominal pain and nausea. Negative for vomiting.  Genitourinary: Negative.   Neurological: Negative for dizziness and headaches.  Psychiatric/Behavioral: Positive for dysphoric mood (through covid and being isolated).    Social History   Tobacco Use  . Smoking status: Current Every Day Smoker    Packs/day: 0.50    Years: 50.00    Pack years: 25.00  . Smokeless tobacco: Never Used  Substance Use Topics  . Alcohol use: Yes    Alcohol/week: 3.0 standard drinks    Types: 3 Glasses of wine per week        Objective:   There were no vitals taken for this visit. There were no vitals filed for this visit.There is no height or weight on file to calculate BMI.   Physical Exam Vitals reviewed.  Pulmonary:     Effort: No respiratory distress.  Neurological:     Mental Status: She is alert.      No results found for any visits on 10/18/19.     Assessment & Plan     1. Compression fracture of body of thoracic vertebra (HCC) Stable. Diagnosis pulled for medication refill. Continue current medical treatment plan. - HYDROcodone-acetaminophen (NORCO/VICODIN) 5-325 MG tablet; Take 1 tablet by mouth every 6 (six) hours as needed for moderate pain or severe pain.  Dispense: 120 tablet; Refill: 0  2. Gastroesophageal reflux disease without esophagitis Noted to be having symptoms and has been under increased stress and anxiety. Will get H. Pylori breath test as below. Patient aware to come to office for testing. Once testing completed she can go ahead and start Protonix as below. I will f/u pending results of breath test. Will follow up in 3-4 weeks to see how she is responding to protonix.  - pantoprazole (PROTONIX) 20 MG tablet; Take 1 tablet (20 mg total) by mouth daily.  Dispense: 90 tablet; Refill: 1 - H. pylori breath test  3. Acquired cyst of kidney Followed by Dr. Bernardo Heater. Due for her 6 month f/u imaging. CT ordered as below. Will f/u pending results.  - CT ABDOMEN PELVIS W WO CONTRAST; Future   I discussed the assessment and treatment plan with the patient. The patient was provided an opportunity to ask questions and all were answered. The patient agreed with the plan and demonstrated an understanding of the instructions.   The patient was advised to call back or seek an in-person evaluation if the symptoms worsen or if the condition fails to improve as anticipated.  I provided 20 minutes of non-face-to-face time during this encounter.    Mar Daring, PA-C  [facility]

## 2019-10-28 DIAGNOSIS — J449 Chronic obstructive pulmonary disease, unspecified: Secondary | ICD-10-CM | POA: Diagnosis not present

## 2019-11-02 ENCOUNTER — Other Ambulatory Visit: Payer: Self-pay | Admitting: Family Medicine

## 2019-11-02 ENCOUNTER — Ambulatory Visit
Admission: RE | Admit: 2019-11-02 | Discharge: 2019-11-02 | Disposition: A | Discharge status: Home / Self Care | Payer: Medicare Other | Source: Ambulatory Visit | Attending: Physician Assistant | Admitting: Physician Assistant

## 2019-11-02 ENCOUNTER — Other Ambulatory Visit: Payer: Self-pay

## 2019-11-02 DIAGNOSIS — N281 Cyst of kidney, acquired: Secondary | ICD-10-CM | POA: Insufficient documentation

## 2019-11-02 DIAGNOSIS — Z1231 Encounter for screening mammogram for malignant neoplasm of breast: Secondary | ICD-10-CM

## 2019-11-02 LAB — POCT I-STAT CREATININE: Creatinine, Ser: 1.2 mg/dL — ABNORMAL HIGH (ref 0.44–1.00)

## 2019-11-02 MED ORDER — IOHEXOL 300 MG/ML  SOLN
100.0000 mL | Freq: Once | INTRAMUSCULAR | Status: AC | PRN
  Administered 2019-11-02: 80 mL via INTRAVENOUS

## 2019-11-04 ENCOUNTER — Telehealth: Payer: Self-pay

## 2019-11-04 NOTE — Telephone Encounter (Signed)
-----   Message from Mar Daring, [place] sent at 11/03/2019 11:54 AM EDT ----- CT scan does show hemorrhagic cyst of the right kidney that is stable. There is also noted 2 cyst in the left kidney that also appear to be simple cyst and most likely benign

## 2019-11-04 NOTE — Telephone Encounter (Signed)
Pt given result of imaging per Fenton Malling; she verbalized understanding; will route to office for notification.

## 2019-11-04 NOTE — Telephone Encounter (Signed)
LMTCB- OK for [facility] to give results if patient calls back

## 2019-11-22 ENCOUNTER — Other Ambulatory Visit: Payer: Self-pay | Admitting: Physician Assistant

## 2019-11-22 DIAGNOSIS — S22000A Wedge compression fracture of unspecified thoracic vertebra, initial encounter for closed fracture: Secondary | ICD-10-CM

## 2019-11-22 MED ORDER — HYDROCODONE-ACETAMINOPHEN 5-325 MG PO TABS: 1.0000 | ORAL_TABLET | Freq: Four times a day (QID) | ORAL | 0 refills | Status: DC | PRN

## 2019-11-22 NOTE — Telephone Encounter (Signed)
Medication Refill - Medication: HYDROcodone-acetaminophen (NORCO/VICODIN) 5-325 MG tablet   Has the patient contacted their pharmacy? No. (Agent: If no, request that the patient contact the pharmacy for the refill.) (Agent: If yes, when and what did the pharmacy advise?)  Preferred Pharmacy (with phone number or street name): [facility] [place], [place] [address]  [place], [place] [address]  Phone:  [phone] Fax:  [phone]   Agent: Please be advised that RX refills may take up to 3 business days. We ask that you follow-up with your pharmacy.

## 2019-11-27 DIAGNOSIS — J449 Chronic obstructive pulmonary disease, unspecified: Secondary | ICD-10-CM | POA: Diagnosis not present

## 2019-12-06 ENCOUNTER — Encounter: Payer: Self-pay | Admitting: Physician Assistant

## 2019-12-11 ENCOUNTER — Other Ambulatory Visit: Payer: Self-pay | Admitting: Physician Assistant

## 2019-12-11 DIAGNOSIS — F411 Generalized anxiety disorder: Secondary | ICD-10-CM

## 2019-12-11 NOTE — Telephone Encounter (Signed)
Requested Prescriptions  Pending Prescriptions Disp Refills  . citalopram (CELEXA) 20 MG tablet [Pharmacy Med Name: CITALOPRAM 20MG  TABLETS] 90 tablet 1    Sig: TAKE 1 TABLET(20 MG) BY MOUTH DAILY     Psychiatry:  Antidepressants - SSRI Passed - 12/11/2019 10:50 AM      Passed - Completed PHQ-2 or PHQ-9 in the last 360 days.      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

## 2019-12-13 ENCOUNTER — Other Ambulatory Visit: Payer: Self-pay | Admitting: Physician Assistant

## 2019-12-13 DIAGNOSIS — F411 Generalized anxiety disorder: Secondary | ICD-10-CM

## 2019-12-13 MED ORDER — ALPRAZOLAM 0.5 MG PO TABS: 0.5000 mg | ORAL_TABLET | Freq: Two times a day (BID) | ORAL | 1 refills | Status: DC | PRN

## 2019-12-13 NOTE — Telephone Encounter (Signed)
ALPRAZolam (XANAX) 0.5 MG tablet    Patient is requesting a refill.    Pharmacy:  [facility] [place], [place] AT [facility] [address] [facility] Phone:  [phone]  Fax:  [phone]

## 2019-12-13 NOTE — Telephone Encounter (Signed)
Requested medication (s) are due for refill today: Yes  Requested medication (s) are on the active medication list: Yes  Last refill:  06/09/19  Future visit scheduled: No  Notes to clinic:  See request    Requested Prescriptions  Pending Prescriptions Disp Refills   ALPRAZolam (XANAX) 0.5 MG tablet 60 tablet 5    Sig: Take 1 tablet (0.5 mg total) by mouth 2 (two) times daily as needed for anxiety.      Not Delegated - Psychiatry:  Anxiolytics/Hypnotics Failed - 12/13/2019  3:54 PM      Failed - This refill cannot be delegated      Failed - Urine Drug Screen completed in last 360 days.      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

## 2019-12-28 DIAGNOSIS — J449 Chronic obstructive pulmonary disease, unspecified: Secondary | ICD-10-CM | POA: Diagnosis not present

## 2019-12-31 ENCOUNTER — Other Ambulatory Visit: Payer: Self-pay | Admitting: Physician Assistant

## 2019-12-31 DIAGNOSIS — S22000A Wedge compression fracture of unspecified thoracic vertebra, initial encounter for closed fracture: Secondary | ICD-10-CM

## 2019-12-31 MED ORDER — HYDROCODONE-ACETAMINOPHEN 5-325 MG PO TABS: 1.0000 | ORAL_TABLET | Freq: Four times a day (QID) | ORAL | 0 refills | Status: DC | PRN

## 2019-12-31 NOTE — Telephone Encounter (Signed)
Requested medication (s) are due for refill today- yes  Requested medication (s) are on the active medication list -yes  Future visit scheduled -no  Last refill: 11/22/19  Notes to clinic: Request for non delegated Rx  Requested Prescriptions  Pending Prescriptions Disp Refills   HYDROcodone-acetaminophen (NORCO/VICODIN) 5-325 MG tablet 120 tablet 0    Sig: Take 1 tablet by mouth every 6 (six) hours as needed for moderate pain or severe pain.      Not Delegated - Analgesics:  Opioid Agonist Combinations Failed - 12/31/2019  4:02 PM      Failed - This refill cannot be delegated      Failed - Urine Drug Screen completed in last 360 days.      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      Not Delegated - Analgesics:  Opioid Agonist Combinations Failed - 12/31/2019  4:02 PM      Failed - This refill cannot be delegated      Failed - Urine Drug Screen completed in last 360 days.      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

## 2019-12-31 NOTE — Telephone Encounter (Signed)
Medication Refill - Medication: Hydrocondone 5/325  Has the patient contacted their pharmacy? No. (Agent: If no, request that the patient contact the pharmacy for the refill.) (Agent: If yes, when and what did the pharmacy advise?)  Preferred Pharmacy (with phone number or street name): [facility]  Agent: Please be advised that RX refills may take up to 3 business days. We ask that you follow-up with your pharmacy.

## 2020-01-25 NOTE — Progress Notes (Signed)
Subjective:   [name] [name] is a 74 y.o. female who presents for Medicare Annual (Subsequent) preventive examination.  I connected with [name] [name] today by telephone and verified that I am speaking with the correct person using two identifiers. Location patient: home Location provider: work Persons participating in the virtual visit: patient, provider.   I discussed the limitations, risks, security and privacy concerns of performing an evaluation and management service by telephone and the availability of in person appointments. I also discussed with the patient that there may be a patient responsible charge related to this service. The patient expressed understanding and verbally consented to this telephonic visit.    Interactive audio and video telecommunications were attempted between this provider and patient, however failed, due to patient having technical difficulties OR patient did not have access to video capability.  We continued and completed visit with audio only.  Review of Systems    N/A  Cardiac Risk Factors include: advanced age (>19men, >63 women);hypertension;dyslipidemia;smoking/ tobacco exposure     Objective:    Today's Vitals   01/26/20 1436  PainSc: 6    There is no height or weight on file to calculate BMI.  Advanced Directives 01/26/2020 01/26/2019 04/07/2018 01/14/2018 10/23/2017 10/18/2017 12/11/2015  Does Patient Have a Medical Advance Directive? No No No No No No No  Would patient like information on creating a medical advance directive? No - Patient declined No - Patient declined - - No - Patient declined No - Patient declined No - patient declined information    Current Medications (verified) Outpatient Encounter Medications as of 01/26/2020  Medication Sig  . albuterol (VENTOLIN HFA) 108 (90 Base) MCG/ACT inhaler Inhale 2 puffs into the lungs every 4 (four) hours as needed for wheezing or shortness of breath.  . ALPRAZolam (XANAX) 0.5 MG tablet Take 1  tablet (0.5 mg total) by mouth 2 (two) times daily as needed for anxiety.  Marland Kitchen aspirin 81 MG tablet Take 81 mg by mouth daily.  . Calcium 600-200 MG-UNIT tablet Take 1 tablet by mouth daily.  . cholecalciferol (VITAMIN D) 25 MCG (1000 UNIT) tablet Take 1,000 Units by mouth daily.  . citalopram (CELEXA) 20 MG tablet TAKE 1 TABLET(20 MG) BY MOUTH DAILY  . Docusate Sodium (COLACE PO) Take 100 mg by mouth daily as needed (constipation).   . furosemide (LASIX) 20 MG tablet Take 20 mg by mouth daily as needed for fluid.   Marland Kitchen HYDROcodone-acetaminophen (NORCO/VICODIN) 5-325 MG tablet Take 1 tablet by mouth every 6 (six) hours as needed for moderate pain or severe pain.  Marland Kitchen ibuprofen (ADVIL,MOTRIN) 600 MG tablet Take 600 mg by mouth every 8 (eight) hours as needed.  Marland Kitchen lisinopril (ZESTRIL) 40 MG tablet TAKE 1 TABLET(40 MG) BY MOUTH DAILY  . metoprolol succinate (TOPROL-XL) 25 MG 24 hr tablet TAKE 1 TABLET BY MOUTH DAILY( DOSE CHANGE TO 25 MG)  . pantoprazole (PROTONIX) 20 MG tablet Take 1 tablet (20 mg total) by mouth daily. (Patient taking differently: Take 20 mg by mouth daily. As needed)  . potassium chloride SA (K-DUR,KLOR-CON) 20 MEQ tablet Take 1 tablet (20 mEq total) by mouth daily.  . simvastatin (ZOCOR) 10 MG tablet TAKE 1 TABLET(10 MG) BY MOUTH DAILY  . Tiotropium Bromide Monohydrate (SPIRIVA RESPIMAT) 1.25 MCG/ACT AERS Inhale 1 puff into the lungs daily.  . mometasone-formoterol (DULERA) 200-5 MCG/ACT AERO Inhale 2 puffs into the lungs 2 (two) times daily. (Patient not taking: Reported on 01/26/2020)   No facility-administered encounter medications on  file as of 01/26/2020.    Allergies (verified) Patient has no known allergies.   History: Past Medical History:  Diagnosis Date  . Anxiety   . CAD (coronary artery disease) unk  . COPD (chronic obstructive pulmonary disease) ([place])   . Depression   . Hypercholesteremia unk  . Hypertension    Past Surgical History:  Procedure Laterality Date    . ABDOMINAL HYSTERECTOMY  1982  . BACK SURGERY    . COLONOSCOPY WITH PROPOFOL N/A 04/07/2018   Procedure: COLONOSCOPY WITH PROPOFOL;  Surgeon: [name], MD;  Location: [facility] ENDOSCOPY;  Service: Endoscopy;  Laterality: N/A;  . KYPHOPLASTY N/A 10/23/2017   Procedure: [id number];  Surgeon: Hessie Knows, MD;  Location: [facility] ORS;  Service: Orthopedics;  Laterality: N/A;   Family History  Problem Relation Age of Onset  . Leukemia Mother   . Aneurysm Father   . Diabetes Father   . Heart disease Father   . Diabetes Maternal Grandmother   . Diabetes Paternal Grandmother    Social History   Socioeconomic History  . Marital status: Widowed    Spouse name: Not on file  . Number of children: 1  . Years of education: Not on file  . Highest education level: Some college, no degree  Occupational History    Employer: RETIRED  Tobacco Use  . Smoking status: Current Every Day Smoker    Packs/day: 0.50    Years: 50.00    Pack years: 25.00  . Smokeless tobacco: Never Used  Vaping Use  . Vaping Use: Never used  Substance and Sexual Activity  . Alcohol use: Yes    Alcohol/week: 2.0 standard drinks    Types: 2 Glasses of wine per week  . Drug use: Yes    Types: Marijuana    Comment: in the past  . Sexual activity: Not on file  Other Topics Concern  . Not on file  Social History Narrative  . Not on file   Social Determinants of Health   Financial Resource Strain: Low Risk   . Difficulty of Paying Living Expenses: Not hard at all  Food Insecurity: No Food Insecurity  . Worried About Charity fundraiser in the Last Year: Never true  . Ran Out of Food in the Last Year: Never true  Transportation Needs: No Transportation Needs  . Lack of Transportation (Medical): No  . Lack of Transportation (Non-Medical): No  Physical Activity: Inactive  . Days of Exercise per Week: 0 days  . Minutes of Exercise per Session: 0 min  Stress: Stress Concern Present  . Feeling of Stress : To  some extent  Social Connections: Socially Isolated  . Frequency of Communication with Friends and Family: More than three times a week  . Frequency of Social Gatherings with Friends and Family: Three times a week  . Attends Religious Services: Never  . Active Member of Clubs or Organizations: No  . Attends Archivist Meetings: Never  . Marital Status: Widowed    Tobacco Counseling Ready to quit: No Counseling given: No   Clinical Intake:  Pre-visit preparation completed: Yes  Pain : 0-10 Pain Score: 6  Pain Type: Chronic pain Pain Location: Rib cage Pain Orientation: Right, Lower Pain Descriptors / Indicators: Aching, Radiating (radiates to spine) Pain Frequency: Constant Pain Relieving Factors: Takes Hydrocodone for pain.  Pain Relieving Factors: Takes Hydrocodone for pain.  Nutritional Risks: Nausea/ vomitting/ diarrhea (Nausea occasionally- due to Hydrocodone?) Diabetes: No  How often do you need to  have someone help you when you read instructions, pamphlets, or other written materials from your doctor or pharmacy?: 1 - Never  Diabetic? No  Interpreter Needed?: No  Information entered by :: [facility], LPN   Activities of Daily Living In your present state of health, do you have any difficulty performing the following activities: 01/26/2020 01/26/2019  Hearing? N Y  Comment - Wears bilateral hearing aids.  Vision? Y Y  Comment Needs a new eye glass prescription. Due to cataracts on both eyes.  Difficulty concentrating or making decisions? N N  Walking or climbing stairs? Tempie Donning  Comment Is fearful of falling. Due to pain and SOB.  Dressing or bathing? N N  Doing errands, shopping? N N  Preparing Food and eating ? N N  Using the Toilet? N N  In the past six months, have you accidently leaked urine? Y Y  Comment Occasionally Occasionally- wears protection daily.  Do you have problems with loss of bowel control? N N  Managing your Medications? N N   Managing your Finances? Y N  Comment Duaghter assists with finances. -  Housekeeping or managing your Housekeeping? Y N  Comment Daughter assists. -  Some recent data might be hidden    Patient Care Team: Mar Daring, PA-C as PCP - General (Family Medicine) Isaias Cowman, MD as Consulting Physician (Cardiology) [name], MD as Consulting Physician (Gastroenterology) Abbie Sons, MD (Urology)  Indicate any recent Medical Services you may have received from other than [facility] providers in the past year (date may be approximate).     Assessment:   This is a routine wellness examination for [place].  Hearing/Vision screen No exam data present  Dietary issues and exercise activities discussed: Current Exercise Habits: The patient does not participate in regular exercise at present, Exercise limited by: orthopedic condition(s)  Goals    . Exercise 150 minutes per week (moderate activity)    . Quit Smoking     Recommend to continue efforts to reduce smoking habits until no longer smoking.       Depression Screen PHQ 2/9 Scores 01/26/2020 01/26/2019 09/26/2017 08/15/2017 06/23/2017 05/20/2017 08/28/2016  PHQ - 2 Score 5 5 4 6 1 3 5   PHQ- 9 Score 23 21 18 19 8 15 13     Fall Risk Fall Risk  01/26/2020 01/26/2019 01/08/2018 10/28/2017 09/26/2017  Falls in the past year? 0 0 Yes Yes No  Comment - - Mid-end of May - -  Number falls in past yr: 0 - 1 1 -  Injury with Fall? 0 - Yes No -  Comment - - She reports that she hit her head,back and shoulder,tight side  - -  Risk for fall due to : - - - - -  Follow up Falls prevention discussed - - - -    Any stairs in or around the home? Yes  If so, are there any without handrails? No  Home free of loose throw rugs in walkways, pet beds, electrical cords, etc? Yes  Adequate lighting in your home to reduce risk of falls? Yes   ASSISTIVE DEVICES UTILIZED TO PREVENT FALLS:  Life alert? No  Use of a cane, walker or w/c? No   Grab bars in the bathroom? No  Shower chair or bench in shower? Yes  Elevated toilet seat or a handicapped toilet? Yes    Cognitive Function:     6CIT Screen 01/26/2020  What Year? 0 points  What month? 0 points  What time? 0 points  Count back from 20 0 points  Months in reverse 0 points  Repeat phrase 0 points  Total Score 0    Immunizations Immunization History  Administered Date(s) Administered  . Influenza, High Dose Seasonal PF 05/23/2016, 05/20/2017, 05/27/2018  . Influenza,inj,Quad PF,6+ Mos 08/08/2015  . Pneumococcal Conjugate-13 03/11/2014  . Pneumococcal Polysaccharide-23 06/26/2006, 05/23/2016  . Tdap 03/22/2011    TDAP status: Up to date Flu Vaccine status: Due fall 2021 Pneumococcal vaccine status: Up to date Covid-19 vaccine status: Completed vaccines  Qualifies for Shingles Vaccine? Yes   Zostavax completed No   Shingrix Completed?: No.    Education has been provided regarding the importance of this vaccine. Patient has been advised to call insurance company to determine out of pocket expense if they have not yet received this vaccine. Advised may also receive vaccine at local pharmacy or Health Dept. Verbalized acceptance and understanding.  Screening Tests Health Maintenance  Topic Date Due  . COVID-19 Vaccine (1) Never done  . MAMMOGRAM  03/04/2019  . COLONOSCOPY  04/08/2019  . INFLUENZA VACCINE  02/27/2020  . DEXA SCAN  03/03/2020  . TETANUS/TDAP  03/21/2021  . Hepatitis C Screening  Completed  . PNA vac Low Risk Adult  Completed    Health Maintenance  Health Maintenance Due  Topic Date Due  . COVID-19 Vaccine (1) Never done  . MAMMOGRAM  03/04/2019  . COLONOSCOPY  04/08/2019    Colorectal cancer screening: Currently due. Referral to GI placed today. Pt aware the office will call re: appt. Mammogram status: Currently due. Declined order today. Bone Density status: Completed 03/03/18. Results reflect: Bone density results: OSTEOPOROSIS.  Repeat every 2 years.  Lung Cancer Screening: (Low Dose CT Chest recommended if Age 38-80 years, 30 pack-year currently smoking OR have quit w/in 15years.) does qualify.   Lung Cancer Screening Referral: An Epic message has been sent to Burgess Estelle, RN (Oncology Nurse Navigator) regarding the possible need for this exam. Raquel Sarna will review the patient's chart to determine if the patient truly qualifies for the exam. If the patient qualifies, Raquel Sarna will order the Low Dose CT of the chest to facilitate the scheduling of this exam.  Additional Screening:  Hepatitis C Screening: Up to date  Vision Screening: Recommended annual ophthalmology exams for early detection of glaucoma and other disorders of the eye. Is the patient up to date with their annual eye exam?  Yes  Who is the provider or what is the name of the office in which the patient attends annual eye exams? [facility] If pt is not established with a provider, would they like to be referred to a provider to establish care? No .   Dental Screening: Recommended annual dental exams for proper oral hygiene  Community Resource Referral / Chronic Care Management: CRR required this visit?  No   CCM required this visit?  No      Plan:     I have personally reviewed and noted the following in the patient's chart:   . Medical and social history . Use of alcohol, tobacco or illicit drugs  . Current medications and supplements . Functional ability and status . Nutritional status . Physical activity . Advanced directives . List of other physicians . Hospitalizations, surgeries, and ER visits in previous 12 months . Vitals . Screenings to include cognitive, depression, and falls . Referrals and appointments  In addition, I have reviewed and discussed with patient certain preventive protocols, quality metrics,  and best practice recommendations. A written personalized care plan for preventive services as well as general  preventive health recommendations were provided to patient.     Myosha Cuadras [place], [place]   [id number]   Nurse Notes: Pt declined a mammogram order at this time. Advised pt to bring her COVID vaccine card to her next apt.

## 2020-01-26 ENCOUNTER — Ambulatory Visit (INDEPENDENT_AMBULATORY_CARE_PROVIDER_SITE_OTHER): Payer: Medicare Other

## 2020-01-26 ENCOUNTER — Other Ambulatory Visit: Payer: Self-pay

## 2020-01-26 DIAGNOSIS — Z86010 Personal history of colonic polyps: Secondary | ICD-10-CM

## 2020-01-26 DIAGNOSIS — Z0000 Encounter for general adult medical examination without abnormal findings: Secondary | ICD-10-CM

## 2020-01-26 DIAGNOSIS — Z1211 Encounter for screening for malignant neoplasm of colon: Secondary | ICD-10-CM

## 2020-01-26 NOTE — Patient Instructions (Signed)
[name] [name] , Thank you for taking time to come for your Medicare Wellness Visit. I appreciate your ongoing commitment to your health goals. Please review the following plan we discussed and let me know if I can assist you in the future.   Screening recommendations/referrals: Colonoscopy: Currently due. Ordered today- advised that office will call to set up apt.  Mammogram: Currently due. Declined order at this time. Bone Density: Up to date, due 02/2020 Recommended yearly ophthalmology/optometry visit for glaucoma screening and checkup Recommended yearly dental visit for hygiene and checkup  Vaccinations: Influenza vaccine: Due fall 2021 Pneumococcal vaccine: Completed series Tdap vaccine: Up to date, due 02/2021 Shingles vaccine: Shingrix discussed. Please contact your pharmacy for coverage information.   Advanced directives: Advance directive discussed with you today. Even though you declined this today please call our office should you change your mind and we can give you the proper paperwork for you to fill out.  Conditions/risks identified: Smoking cessation discussed today.  Next appointment: 02/21/20 @ 2:00 PM with [place] 65 Years and Older, Female Preventive care refers to lifestyle choices and visits with your health care provider that can promote health and wellness. What does preventive care include?  A yearly physical exam. This is also called an annual well check.  Dental exams once or twice a year.  Routine eye exams. Ask your health care provider how often you should have your eyes checked.  Personal lifestyle choices, including:  Daily care of your teeth and gums.  Regular physical activity.  Eating a healthy diet.  Avoiding tobacco and drug use.  Limiting alcohol use.  Practicing safe sex.  Taking low-dose aspirin every day.  Taking vitamin and mineral supplements as recommended by your health care provider. What happens  during an annual well check? The services and screenings done by your health care provider during your annual well check will depend on your age, overall health, lifestyle risk factors, and family history of disease. Counseling  Your health care provider may ask you questions about your:  Alcohol use.  Tobacco use.  Drug use.  Emotional well-being.  Home and relationship well-being.  Sexual activity.  Eating habits.  History of falls.  Memory and ability to understand (cognition).  Work and work Statistician.  Reproductive health. Screening  You may have the following tests or measurements:  Height, weight, and BMI.  Blood pressure.  Lipid and cholesterol levels. These may be checked every 5 years, or more frequently if you are over 52 years old.  Skin check.  Lung cancer screening. You may have this screening every year starting at age 37 if you have a 30-pack-year history of smoking and currently smoke or have quit within the past 15 years.  Fecal occult blood test (FOBT) of the stool. You may have this test every year starting at age 52.  Flexible sigmoidoscopy or colonoscopy. You may have a sigmoidoscopy every 5 years or a colonoscopy every 10 years starting at age 78.  Hepatitis C blood test.  Hepatitis B blood test.  Sexually transmitted disease (STD) testing.  Diabetes screening. This is done by checking your blood sugar (glucose) after you have not eaten for a while (fasting). You may have this done every 1-3 years.  Bone density scan. This is done to screen for osteoporosis. You may have this done starting at age 28.  Mammogram. This may be done every 1-2 years. Talk to your health care provider about how often  you should have regular mammograms. Talk with your health care provider about your test results, treatment options, and if necessary, the need for more tests. Vaccines  Your health care provider may recommend certain vaccines, such  as:  Influenza vaccine. This is recommended every year.  Tetanus, diphtheria, and acellular pertussis (Tdap, Td) vaccine. You may need a Td booster every 10 years.  Zoster vaccine. You may need this after age 62.  Pneumococcal 13-valent conjugate (PCV13) vaccine. One dose is recommended after age 4.  Pneumococcal polysaccharide (PPSV23) vaccine. One dose is recommended after age 79. Talk to your health care provider about which screenings and vaccines you need and how often you need them. This information is not intended to replace advice given to you by your health care provider. Make sure you discuss any questions you have with your health care provider. Document Released: 08/11/2015 Document Revised: 04/03/2016 Document Reviewed: 05/16/2015 Elsevier Interactive Patient Education  2017 [place] Prevention in the Home Falls can cause injuries. They can happen to people of all ages. There are many things you can do to make your home safe and to help prevent falls. What can I do on the outside of my home?  Regularly fix the edges of walkways and driveways and fix any cracks.  Remove anything that might make you trip as you walk through a door, such as a raised step or threshold.  Trim any bushes or trees on the path to your home.  Use bright outdoor lighting.  Clear any walking paths of anything that might make someone trip, such as rocks or tools.  Regularly check to see if handrails are loose or broken. Make sure that both sides of any steps have handrails.  Any raised decks and porches should have guardrails on the edges.  Have any leaves, snow, or ice cleared regularly.  Use sand or salt on walking paths during winter.  Clean up any spills in your garage right away. This includes oil or grease spills. What can I do in the bathroom?  Use night lights.  Install grab bars by the toilet and in the tub and shower. Do not use towel bars as grab bars.  Use  non-skid mats or decals in the tub or shower.  If you need to sit down in the shower, use a plastic, non-slip stool.  Keep the floor dry. Clean up any water that spills on the floor as soon as it happens.  Remove soap buildup in the tub or shower regularly.  Attach bath mats securely with double-sided non-slip rug tape.  Do not have throw rugs and other things on the floor that can make you trip. What can I do in the bedroom?  Use night lights.  Make sure that you have a light by your bed that is easy to reach.  Do not use any sheets or blankets that are too big for your bed. They should not hang down onto the floor.  Have a firm chair that has side arms. You can use this for support while you get dressed.  Do not have throw rugs and other things on the floor that can make you trip. What can I do in the kitchen?  Clean up any spills right away.  Avoid walking on wet floors.  Keep items that you use a lot in easy-to-reach places.  If you need to reach something above you, use a strong step stool that has a grab bar.  Keep electrical cords  out of the way.  Do not use floor polish or wax that makes floors slippery. If you must use wax, use non-skid floor wax.  Do not have throw rugs and other things on the floor that can make you trip. What can I do with my stairs?  Do not leave any items on the stairs.  Make sure that there are handrails on both sides of the stairs and use them. Fix handrails that are broken or loose. Make sure that handrails are as long as the stairways.  Check any carpeting to make sure that it is firmly attached to the stairs. Fix any carpet that is loose or worn.  Avoid having throw rugs at the top or bottom of the stairs. If you do have throw rugs, attach them to the floor with carpet tape.  Make sure that you have a light switch at the top of the stairs and the bottom of the stairs. If you do not have them, ask someone to add them for you. What  else can I do to help prevent falls?  Wear shoes that:  Do not have high heels.  Have rubber bottoms.  Are comfortable and fit you well.  Are closed at the toe. Do not wear sandals.  If you use a stepladder:  Make sure that it is fully opened. Do not climb a closed stepladder.  Make sure that both sides of the stepladder are locked into place.  Ask someone to hold it for you, if possible.  Clearly mark and make sure that you can see:  Any grab bars or handrails.  First and last steps.  Where the edge of each step is.  Use tools that help you move around (mobility aids) if they are needed. These include:  Canes.  Walkers.  Scooters.  Crutches.  Turn on the lights when you go into a dark area. Replace any light bulbs as soon as they burn out.  Set up your furniture so you have a clear path. Avoid moving your furniture around.  If any of your floors are uneven, fix them.  If there are any pets around you, be aware of where they are.  Review your medicines with your doctor. Some medicines can make you feel dizzy. This can increase your chance of falling. Ask your doctor what other things that you can do to help prevent falls. This information is not intended to replace advice given to you by your health care provider. Make sure you discuss any questions you have with your health care provider. Document Released: 05/11/2009 Document Revised: 12/21/2015 Document Reviewed: 08/19/2014 Elsevier Interactive Patient Education  2017 [employer].

## 2020-01-27 ENCOUNTER — Telehealth: Payer: Self-pay

## 2020-01-27 NOTE — Telephone Encounter (Signed)
Copied from [place] [phone]. Topic: General - Other >> Jan 27, 2020 10:09 AM Jodie Echevaria wrote: Reason for CRM: Patient daughter called to inquire about AWV scheduled for 01/28/20 patient spoke to [facility] on 01/26/20. So she would like to know if this is still necessary Please call Ph# [phone]

## 2020-01-27 NOTE — Telephone Encounter (Signed)
Per Wells Guiles please disregard the message she had her dates wrong and is for next year that she is scheduled again for the AWV

## 2020-02-10 ENCOUNTER — Other Ambulatory Visit: Payer: Self-pay | Admitting: Physician Assistant

## 2020-02-10 DIAGNOSIS — E7800 Pure hypercholesterolemia, unspecified: Secondary | ICD-10-CM

## 2020-02-10 DIAGNOSIS — I10 Essential (primary) hypertension: Secondary | ICD-10-CM

## 2020-02-14 ENCOUNTER — Other Ambulatory Visit: Payer: Self-pay | Admitting: Physician Assistant

## 2020-02-14 DIAGNOSIS — S22000A Wedge compression fracture of unspecified thoracic vertebra, initial encounter for closed fracture: Secondary | ICD-10-CM

## 2020-02-14 DIAGNOSIS — F411 Generalized anxiety disorder: Secondary | ICD-10-CM

## 2020-02-14 MED ORDER — HYDROCODONE-ACETAMINOPHEN 5-325 MG PO TABS: 1.0000 | ORAL_TABLET | Freq: Four times a day (QID) | ORAL | 0 refills | Status: DC | PRN

## 2020-02-14 MED ORDER — ALPRAZOLAM 0.5 MG PO TABS: 0.5000 mg | ORAL_TABLET | Freq: Two times a day (BID) | ORAL | 1 refills | Status: DC | PRN

## 2020-02-14 NOTE — Telephone Encounter (Signed)
Requested medication (s) are due for refill today: yes  Requested medication (s) are on the active medication list: yes  Last refill:  hydrocodone-acetaminophen:12/31/19  xanax: 12/13/19  Future visit scheduled: yes  Notes to clinic:  both medications requested are not delegated to NT to RF   Requested Prescriptions  Pending Prescriptions Disp Refills   HYDROcodone-acetaminophen (NORCO/VICODIN) 5-325 MG tablet 120 tablet 0    Sig: Take 1 tablet by mouth every 6 (six) hours as needed for moderate pain or severe pain.      Not Delegated - Analgesics:  Opioid Agonist Combinations Failed - 02/14/2020  3:19 PM      Failed - This refill cannot be delegated      Failed - Urine Drug Screen completed in last 360 days.      Passed - Valid encounter within last 6 months    Recent Outpatient Visits           3 months ago Gastroesophageal reflux disease without esophagitis   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      Not Delegated - Psychiatry:  Anxiolytics/Hypnotics Failed - 02/14/2020  3:19 PM      Failed - This refill cannot be delegated      Failed - Urine Drug Screen  completed in last 360 days.      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

## 2020-02-14 NOTE — Telephone Encounter (Signed)
Medication Refill - Medication: HYDROcodone-acetaminophen (NORCO/VICODIN) 5-325 MG tablet   ALPRAZolam (XANAX) 0.5 MG tablet    Has the patient contacted their pharmacy? No. (Agent: If no, request that the patient contact the pharmacy for the refill.) (Agent: If yes, when and what did the pharmacy advise?)  Preferred Pharmacy (with phone number or street name): [facility] [place], [place] - [address]  [place], [place] [address]  Phone:  [phone] Fax:  [phone]   Agent: Please be advised that RX refills may take up to 3 business days. We ask that you follow-up with your pharmacy.

## 2020-02-16 ENCOUNTER — Encounter: Payer: Self-pay | Admitting: *Deleted

## 2020-02-16 NOTE — Telephone Encounter (Signed)
Attempted to contact patient regarding lung screening scan. However there is no answer or voicemail option available. Sent mychart message to patient.

## 2020-02-21 ENCOUNTER — Other Ambulatory Visit: Payer: Self-pay

## 2020-02-21 ENCOUNTER — Ambulatory Visit (INDEPENDENT_AMBULATORY_CARE_PROVIDER_SITE_OTHER): Payer: Self-pay | Admitting: Physician Assistant

## 2020-02-21 ENCOUNTER — Encounter: Payer: Self-pay | Admitting: Physician Assistant

## 2020-02-21 VITALS — BP 155/71 | HR 56 | Temp 97.0°F | Resp 16 | Wt 90.0 lb

## 2020-02-21 DIAGNOSIS — Z1239 Encounter for other screening for malignant neoplasm of breast: Secondary | ICD-10-CM

## 2020-02-21 DIAGNOSIS — J418 Mixed simple and mucopurulent chronic bronchitis: Secondary | ICD-10-CM

## 2020-02-21 DIAGNOSIS — I712 Thoracic aortic aneurysm, without rupture: Secondary | ICD-10-CM

## 2020-02-21 DIAGNOSIS — S32010D Wedge compression fracture of first lumbar vertebra, subsequent encounter for fracture with routine healing: Secondary | ICD-10-CM

## 2020-02-21 DIAGNOSIS — I10 Essential (primary) hypertension: Secondary | ICD-10-CM

## 2020-02-21 DIAGNOSIS — F320 Major depressive disorder, single episode, mild: Secondary | ICD-10-CM

## 2020-02-21 DIAGNOSIS — F411 Generalized anxiety disorder: Secondary | ICD-10-CM

## 2020-02-21 DIAGNOSIS — S22000A Wedge compression fracture of unspecified thoracic vertebra, initial encounter for closed fracture: Secondary | ICD-10-CM

## 2020-02-21 DIAGNOSIS — I7123 Aneurysm of the descending thoracic aorta, without rupture: Secondary | ICD-10-CM

## 2020-02-21 DIAGNOSIS — S22080D Wedge compression fracture of T11-T12 vertebra, subsequent encounter for fracture with routine healing: Secondary | ICD-10-CM

## 2020-02-21 DIAGNOSIS — I509 Heart failure, unspecified: Secondary | ICD-10-CM

## 2020-02-21 MED ORDER — SPIRIVA RESPIMAT 1.25 MCG/ACT IN AERS: 1.0000 | INHALATION_SPRAY | Freq: Every day | RESPIRATORY_TRACT | 5 refills | Status: DC

## 2020-02-21 MED ORDER — ALBUTEROL SULFATE HFA 108 (90 BASE) MCG/ACT IN AERS: 2.0000 | INHALATION_SPRAY | RESPIRATORY_TRACT | 5 refills | Status: AC | PRN

## 2020-02-21 MED ORDER — CITALOPRAM HYDROBROMIDE 40 MG PO TABS: 40.0000 mg | ORAL_TABLET | Freq: Every day | ORAL | 3 refills | Status: DC

## 2020-02-21 MED ORDER — DULERA 200-5 MCG/ACT IN AERO: 2.0000 | INHALATION_SPRAY | Freq: Two times a day (BID) | RESPIRATORY_TRACT | 3 refills | Status: DC

## 2020-02-21 MED ORDER — CLONAZEPAM 0.5 MG PO TABS: 0.5000 mg | ORAL_TABLET | Freq: Two times a day (BID) | ORAL | 0 refills | Status: DC | PRN

## 2020-02-21 NOTE — Patient Instructions (Addendum)
When it comes time for new refill we can try oxycodone-acetaminophen 5-325mg  instead of the hydrocodone-acetaminophen  Could also try gabapentin for back pain. It helps to calm down irritated nerves and helps with pain on that level. Call if would like to try.   Use Spiriva and Dulera daily for COPD. Can use Albuterol every 4-6 hours as needed through a day for any shortness of breath or wheezing.  Increase Citalopram from 20mg  to 40mg  for increasing anxiety.   Change alprazolam to clonazepam for anxiety. Ok to wait until need of refill around 03/13/20  Call [facility] [place]: [phone] to schedule mammogram.  May need to call Dr. Saralyn Pilar for follow up as it has been since 2019 since last seen him.  Due for repeat colonoscopy. Need to contact Dr. Allen Norris to set up once insurance active.

## 2020-02-21 NOTE — Progress Notes (Signed)
Established patient visit   Patient: [name] [name]   DOB: 08/06/1945   74 y.o. Female  MRN: [id number] Visit Date: 02/21/2020  Today's healthcare provider: Mar Daring, PA-C   Chief Complaint  Patient presents with  . Depression  . Anxiety  . Leg Swelling   Subjective    HPI  Patient here with c/o back pain, right flank. Uses Hydrocodone-Acetaminophen every 6 hrs prn and takes Ibuprofen in between doses.   Anxiety:Xanax is not working as well. Reports that is running out before the 30 days. She reports that she feels useless, unsure of making a decision, leaves in her pajamas and feels like just staying inn her bed, lonely.  Leg swelling: c/o right leg swelling and foot. She has taken the lasix to help with the swelling.   She is also c/o shakiness and pain on the right side of her head. Reports that she just hurts more on the right side of her body.  COPD: She is using the Spiriva but is not sure if she should be using the [facility] or albuterol inhaler.  BP: She reports that her BP has been running between 140's/70's  Patient Active Problem List   Diagnosis Date Noted  . Encounter for breast cancer screening using non-mammogram modality 02/22/2020  . Depression, major, single episode, mild ([place]) 02/05/2019  . Personal history of colonic polyps   . Benign neoplasm of cecum   . Benign neoplasm of transverse colon   . Benign neoplasm of ascending colon   . Compression fracture of body of thoracic vertebra (HCC) 10/28/2017  . 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 Renal cyst 10/28/2017  . Hypokalemia 10/18/2017  . Chronic congestive heart failure ([place]) 09/26/2017  . Alcohol abuse 08/28/2016  . Pedal edema 03/13/2016  . Osteoporosis 09/07/2015  . Pleural effusion 02/03/2015  . Rib fracture 02/02/2015  . Body mass index (BMI) of 23.0-23.9 in adult 12/29/2014  . Gastric ulcer 12/29/2014  . H/O transient cerebral ischemia 12/29/2014  . HLD  (hyperlipidemia) 12/29/2014  . BP (high blood pressure) 12/29/2014  . Below normal amount of sodium in the blood 12/29/2014  . Awareness of heartbeats 12/29/2014  . Abnormal Pap smear of vagina 12/29/2014  . Plantar fasciitis 12/29/2014  . Esophageal reflux 12/29/2014  . 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 TI (tricuspid incompetence) 12/31/2013  . Chest pain 12/31/2013  . Chronic obstructive pulmonary disease ([place]) 12/31/2013  . Abnormal blood sugar 03/01/2009  . GAD (generalized anxiety disorder) 02/15/2009  . Current tobacco use 06/26/2006   Past Medical History:  Diagnosis Date  . Anxiety   . CAD (coronary artery disease) unk  . COPD (chronic obstructive pulmonary disease) ([place])   . Depression   . Hypercholesteremia unk  . Hypertension        Medications: Outpatient Medications Prior to Visit  Medication Sig  . aspirin 81 MG tablet Take 81 mg by mouth daily.  . Calcium 600-200 MG-UNIT tablet Take 1 tablet by mouth daily.  . cholecalciferol (VITAMIN D) 25 MCG (1000 UNIT) tablet Take 1,000 Units by mouth daily.  Mariane Baumgarten Sodium (COLACE PO) Take 100 mg by mouth daily as needed (constipation).   . furosemide (LASIX) 20 MG tablet Take 20 mg by mouth daily as needed for fluid.   Marland Kitchen HYDROcodone-acetaminophen (NORCO/VICODIN) 5-325 MG tablet Take 1 tablet by mouth every 6 (six) hours as needed for moderate pain or severe pain.  Marland Kitchen ibuprofen (ADVIL,MOTRIN) 600 MG tablet Take  600 mg by mouth every 8 (eight) hours as needed.  Marland Kitchen lisinopril (ZESTRIL) 40 MG tablet TAKE 1 TABLET(40 MG) BY MOUTH DAILY  . metoprolol succinate (TOPROL-XL) 25 MG 24 hr tablet TAKE 1 TABLET BY MOUTH EVERY DAY  . pantoprazole (PROTONIX) 20 MG tablet Take 1 tablet (20 mg total) by mouth daily. (Patient taking differently: Take 20 mg by mouth daily. As needed)  . potassium chloride SA (K-DUR,KLOR-CON) 20 MEQ tablet Take 1 tablet (20 mEq total) by mouth daily.  . simvastatin (ZOCOR) 10 MG tablet TAKE 1 TABLET(10  MG) BY MOUTH DAILY  . [DISCONTINUED] ALPRAZolam (XANAX) 0.5 MG tablet Take 1 tablet (0.5 mg total) by mouth 2 (two) times daily as needed for anxiety.  . [DISCONTINUED] citalopram (CELEXA) 20 MG tablet TAKE 1 TABLET(20 MG) BY MOUTH DAILY  . [DISCONTINUED] mometasone-formoterol (DULERA) 200-5 MCG/ACT AERO Inhale 2 puffs into the lungs 2 (two) times daily.   . [DISCONTINUED] Tiotropium Bromide Monohydrate (SPIRIVA RESPIMAT) 1.25 MCG/ACT AERS Inhale 1 puff into the lungs daily.  . [DISCONTINUED] albuterol (VENTOLIN HFA) 108 (90 Base) MCG/ACT inhaler Inhale 2 puffs into the lungs every 4 (four) hours as needed for wheezing or shortness of breath.   No facility-administered medications prior to visit.    Review of Systems  Constitutional: Positive for fatigue.  HENT: Negative.   Respiratory: Positive for chest tightness, shortness of breath and wheezing.   Cardiovascular: Positive for leg swelling. Negative for chest pain and palpitations.  Musculoskeletal: Positive for back pain and gait problem.  Psychiatric/Behavioral: Positive for dysphoric mood and sleep disturbance. The patient is nervous/anxious.     Last CBC Lab Results  Component Value Date   WBC 8.4 01/14/2018   HGB 12.6 01/14/2018   HCT 36.9 01/14/2018   MCV 96.5 01/14/2018   MCH 33.0 01/14/2018   RDW 16.1 (H) 01/14/2018   PLT 256 25/42/7062   Last metabolic panel Lab Results  Component Value Date   GLUCOSE 133 (H) 01/14/2018   NA 129 (L) 01/14/2018   K 2.9 (L) 01/14/2018   CL 93 (L) 01/14/2018   CO2 24 01/14/2018   BUN 12 01/14/2018   CREATININE 1.20 (H) 11/02/2019   GFRNONAA >60 01/14/2018   GFRAA >60 01/14/2018   CALCIUM 8.5 (L) 01/14/2018   PROT 7.2 01/14/2018   ALBUMIN 3.8 01/14/2018   LABGLOB 3.0 09/26/2017   AGRATIO 1.4 09/26/2017   BILITOT 0.8 01/14/2018   ALKPHOS 76 01/14/2018   AST 24 01/14/2018   ALT 10 (L) 01/14/2018   ANIONGAP 12 01/14/2018      Objective    BP (!) 155/71 (BP Location: Left  Arm, Patient Position: Sitting, Cuff Size: Normal)   Pulse 56   Temp (!) 97 F (36.1 C) (Temporal)   Resp 16   Wt (!) 90 lb (40.8 kg)   SpO2 95%   BMI 16.46 kg/m  BP Readings from Last 3 Encounters:  02/21/20 (!) 155/71  04/09/19 (!) 147/76  06/03/18 (!) 96/52   Wt Readings from Last 3 Encounters:  02/21/20 (!) 90 lb (40.8 kg)  04/09/19 88 lb (39.9 kg)  06/03/18 98 lb (44.5 kg)      Physical Exam Vitals reviewed.  Constitutional:      General: She is not in acute distress.    Appearance: Normal appearance. She is well-developed, well-groomed and underweight. She is not ill-appearing or diaphoretic.  HENT:     Head: Normocephalic and atraumatic.     Right Ear: Tympanic membrane, ear canal  and external ear normal.     Left Ear: Tympanic membrane, ear canal and external ear normal.  Eyes:     Extraocular Movements: Extraocular movements intact.     Pupils: Pupils are equal, round, and reactive to light.  Neck:     Thyroid: No thyromegaly.     Vascular: No carotid bruit or JVD.     Trachea: No tracheal deviation.  Cardiovascular:     Rate and Rhythm: Normal rate and regular rhythm.     Pulses: Normal pulses.     Heart sounds: Normal heart sounds. No murmur heard.  No friction rub. No gallop.   Pulmonary:     Effort: Pulmonary effort is normal. No respiratory distress.     Breath sounds: Normal breath sounds. No wheezing or rales.  Musculoskeletal:     Cervical back: Normal range of motion and neck supple.     Right lower leg: Edema (trace) present.     Left lower leg: Edema (trace) present.  Lymphadenopathy:     Cervical: No cervical adenopathy.  Neurological:     Mental Status: She is alert.  Psychiatric:        Attention and Perception: Attention and perception normal.        Mood and Affect: Mood is anxious and depressed.        Speech: Speech normal.        Behavior: Behavior normal. Behavior is cooperative.      No results found for any visits on  02/21/20.  Assessment & Plan     Problem List Items Addressed This Visit      Cardiovascular and Mediastinum   BP (high blood pressure) - Primary    Elevated Continue Lisinopril 40mg , metoprolol XR 25mg  F/U once insurance reinstated in the coming months      Chronic congestive heart failure (HCC)    Stable  Continue medications, can use furosemide daily as needed F/U once insurance reinstated for labs      Aneurysm of descending thoracic aorta ([place])    Due for repeat imaging Lastly was 4.0cm in 09/2017 and was to be repeated in 6 months. This was missed due to covid 19 pandemic Patient currently between insurances Need to order once has insurance again        Respiratory   Chronic obstructive pulmonary disease ([place])    Stable Advised to continue Spiriva and Dulera daily May use albuterol q 4-6 hrs as needed for wheezing and SOB      Relevant Medications   Tiotropium Bromide Monohydrate (SPIRIVA RESPIMAT) 1.25 MCG/ACT AERS   mometasone-formoterol (DULERA) 200-5 MCG/ACT AERO   albuterol (VENTOLIN HFA) 108 (90 Base) MCG/ACT inhaler     Musculoskeletal and Integument   Compression fracture of body of thoracic vertebra (HCC)    Unchanged Still has radiating pain Continue Norco 5-325mg  q 6 hrs May add gabapentin at next visit if [place], daughter, agrees        Other   GAD (generalized anxiety disorder)    Worsening since pandemic Will increase citalopram from 20mg  to 40mg  Change alprazolam to clonazepam F/U once insurance in place      Relevant Medications   citalopram (CELEXA) 40 MG tablet   clonazePAM (KLONOPIN) 0.5 MG tablet (Start on 03/13/2020)   Depression, major, single episode, mild (HCC)    Worsening due to pandemic Increased citalopram from 20mg  to 40mg       Relevant Medications   citalopram (CELEXA) 40 MG tablet   Encounter for breast  cancer screening using non-mammogram modality    Mammogram ordered [place] information provided for patient to  schedule      Relevant Orders   MM 3D SCREEN BREAST BILATERAL      Return in about 4 weeks (around 03/20/2020) for anxiety, depression, BP, back pain.      Reynolds Bowl, PA-C, have reviewed all documentation for this visit. The documentation on 02/22/20 for the exam, diagnosis, procedures, and orders are all accurate and complete.   Rubye Beach  [facility] [phone] (phone) [phone] (fax)  [place]

## 2020-02-22 ENCOUNTER — Encounter: Payer: Self-pay | Admitting: Physician Assistant

## 2020-02-22 DIAGNOSIS — Z1239 Encounter for other screening for malignant neoplasm of breast: Secondary | ICD-10-CM | POA: Insufficient documentation

## 2020-02-22 NOTE — Assessment & Plan Note (Signed)
Worsening due to pandemic Increased citalopram from 20mg  to 40mg 

## 2020-02-22 NOTE — Assessment & Plan Note (Signed)
Due for repeat imaging Lastly was 4.0cm in 09/2017 and was to be repeated in 6 months. This was missed due to covid 19 pandemic Patient currently between insurances Need to order once has insurance again

## 2020-02-22 NOTE — Assessment & Plan Note (Signed)
Worsening since pandemic Will increase citalopram from 20mg  to 40mg  Change alprazolam to clonazepam F/U once insurance in place

## 2020-02-22 NOTE — Assessment & Plan Note (Signed)
Stable Advised to continue Spiriva and Dulera daily May use albuterol q 4-6 hrs as needed for wheezing and SOB

## 2020-02-22 NOTE — Assessment & Plan Note (Addendum)
Unchanged Still has radiating pain Continue Norco 5-325mg  q 6 hrs May add gabapentin at next visit if [place], daughter, agrees

## 2020-02-22 NOTE — Assessment & Plan Note (Signed)
Mammogram ordered [place] information provided for patient to schedule

## 2020-02-22 NOTE — Assessment & Plan Note (Signed)
Stable  Continue medications, can use furosemide daily as needed F/U once insurance reinstated for labs

## 2020-02-22 NOTE — Assessment & Plan Note (Signed)
Elevated Continue Lisinopril 40mg , metoprolol XR 25mg  F/U once insurance reinstated in the coming months

## 2020-02-27 DIAGNOSIS — J449 Chronic obstructive pulmonary disease, unspecified: Secondary | ICD-10-CM | POA: Diagnosis not present

## 2020-03-13 ENCOUNTER — Telehealth: Payer: Self-pay | Admitting: Physician Assistant

## 2020-03-13 ENCOUNTER — Telehealth: Payer: Self-pay

## 2020-03-13 DIAGNOSIS — S22000A Wedge compression fracture of unspecified thoracic vertebra, initial encounter for closed fracture: Secondary | ICD-10-CM

## 2020-03-13 DIAGNOSIS — F411 Generalized anxiety disorder: Secondary | ICD-10-CM

## 2020-03-13 NOTE — Telephone Encounter (Signed)
Unable to reach patient because voicemailbox is full. When patient returns call [facility] triage nurse can review over medication list with patient. KW

## 2020-03-13 NOTE — Telephone Encounter (Signed)
Pt returned call, call was dropped during transfer to triage. Attempted to CB patient, VM full, unable to leave message.

## 2020-03-13 NOTE — Telephone Encounter (Signed)
Copied from [place] [id number]. Topic: General - Inquiry >> Mar 13, 2020  3:16 PM [name] [name] wrote: Reason for CRM: Pt is requesting a call back regarding medications that's supposed to prescribed that are not listed on meds list

## 2020-03-14 NOTE — Telephone Encounter (Signed)
Attempted to call pt. On home # and mobile #; no answer and mailboxes are full for both phones.

## 2020-03-16 MED ORDER — CLONAZEPAM 0.5 MG PO TABS: 0.5000 mg | ORAL_TABLET | Freq: Two times a day (BID) | ORAL | 0 refills | Status: DC | PRN

## 2020-03-16 MED ORDER — OXYCODONE-ACETAMINOPHEN 5-325 MG PO TABS: 1.0000 | ORAL_TABLET | Freq: Four times a day (QID) | ORAL | 0 refills | Status: DC | PRN

## 2020-03-16 NOTE — Telephone Encounter (Signed)
Patient advised. Verbalized understanding 

## 2020-03-16 NOTE — Telephone Encounter (Signed)
Pt returned call 8/16 , states was having trouble with phone.  States she was to have Oxycodone prescribed when she ran out of hydrocodone, she is out. Also reports was to change to clonazepam form alprazolam; she is out of med. States she does have prescription for Celexa 40 mg. Also reports she has additional information regarding change in insurance companies: is now with [facility], [employer].  CB# [phone] Please advise.

## 2020-03-16 NOTE — Telephone Encounter (Signed)
Changed hydrocodone-apap 5-325mg  1 tab q 6 hrs to oxycodone-apap 5-325mg  1 tab q 6hs  Alprazolam was changed to clonazepam for a longer half life to avoid taking more. Will monitor for gait instability or drowsiness

## 2020-03-23 NOTE — Progress Notes (Signed)
Established patient visit   Patient: [name] [name]   DOB: 1946-02-14   74 y.o. Female  MRN: [id number] Visit Date: 03/24/2020  Today's healthcare provider: Mar Daring, PA-C   Chief Complaint  Patient presents with  . Follow-up   Subjective    HPI  Depression, Follow-up  She  was last seen for this 4-6 weeks ago. Changes made at last visit include increase Citalopram from 20mg  to 40mg    She reports excellent compliance with treatment. She is not having side effects. Reports that she just pick up the medicine yesterday.  She reports good tolerance of treatment. Current symptoms include: depressed mood, fatigue, hopelessness, weight loss and weakness in her legs She feels she is Unchanged since last visit.  Depression screen [facility] 2/9 01/26/2020 01/26/2019 09/26/2017  Decreased Interest 3 3 2   Down, Depressed, Hopeless 2 2 2   PHQ - 2 Score 5 5 4   Altered sleeping 3 3 2   Tired, decreased energy 3 3 2   Change in appetite 3 3 2   Feeling bad or failure about yourself  3 0 2  Trouble concentrating 3 3 2   Moving slowly or fidgety/restless 3 3 2   Suicidal thoughts 0 1 2  PHQ-9 Score 23 21 18   Difficult doing work/chores Somewhat difficult Very difficult Extremely dIfficult    -----------------------------------------------------------------------------------------   Patient Active Problem List   Diagnosis Date Noted  . Encounter for breast cancer screening using non-mammogram modality 02/22/2020  . Depression, major, single episode, mild ([place]) 02/05/2019  . Personal history of colonic polyps   . Benign neoplasm of cecum   . Benign neoplasm of transverse colon   . Benign neoplasm of ascending colon   . Compression fracture of body of thoracic vertebra (HCC) 10/28/2017  . 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 Renal cyst 10/28/2017  . Hypokalemia 10/18/2017  . Chronic congestive heart failure ([place]) 09/26/2017  . Alcohol abuse 08/28/2016  .  Pedal edema 03/13/2016  . Osteoporosis 09/07/2015  . Pleural effusion 02/03/2015  . Rib fracture 02/02/2015  . Gastric ulcer 12/29/2014  . H/O transient cerebral ischemia 12/29/2014  . HLD (hyperlipidemia) 12/29/2014  . BP (high blood pressure) 12/29/2014  . Below normal amount of sodium in the blood 12/29/2014  . Awareness of heartbeats 12/29/2014  . Abnormal Pap smear of vagina 12/29/2014  . Plantar fasciitis 12/29/2014  . Esophageal reflux 12/29/2014  . 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 TI (tricuspid incompetence) 12/31/2013  . Chest pain 12/31/2013  . Chronic obstructive pulmonary disease ([place]) 12/31/2013  . Abnormal blood sugar 03/01/2009  . GAD (generalized anxiety disorder) 02/15/2009  . Current tobacco use 06/26/2006   Past Medical History:  Diagnosis Date  . Anxiety   . CAD (coronary artery disease) unk  . COPD (chronic obstructive pulmonary disease) ([place])   . Depression   . Hypercholesteremia unk  . Hypertension        Medications: Outpatient Medications Prior to Visit  Medication Sig  . albuterol (VENTOLIN HFA) 108 (90 Base) MCG/ACT inhaler Inhale 2 puffs into the lungs every 4 (four) hours as needed for wheezing or shortness of breath.  Marland Kitchen aspirin 81 MG tablet Take 81 mg by mouth daily.  . Calcium 600-200 MG-UNIT tablet Take 1 tablet by mouth daily.  . cholecalciferol (VITAMIN D) 25 MCG (1000 UNIT) tablet Take 1,000 Units by mouth daily.  . citalopram (CELEXA) 40 MG tablet Take 1 tablet (40 mg total) by mouth daily.  . clonazePAM (KLONOPIN)  0.5 MG tablet Take 1 tablet (0.5 mg total) by mouth 2 (two) times daily as needed for anxiety.  Mariane Baumgarten Sodium (COLACE PO) Take 100 mg by mouth daily as needed (constipation).   . furosemide (LASIX) 20 MG tablet Take 20 mg by mouth daily as needed for fluid.   Marland Kitchen ibuprofen (ADVIL,MOTRIN) 600 MG tablet Take 600 mg by mouth every 8 (eight) hours as needed.  Marland Kitchen lisinopril (ZESTRIL) 40 MG tablet TAKE 1 TABLET(40 MG) BY  MOUTH DAILY  . metoprolol succinate (TOPROL-XL) 25 MG 24 hr tablet TAKE 1 TABLET BY MOUTH EVERY DAY  . mometasone-formoterol (DULERA) 200-5 MCG/ACT AERO Inhale 2 puffs into the lungs 2 (two) times daily.  Marland Kitchen oxyCODONE-acetaminophen (PERCOCET/ROXICET) 5-325 MG tablet Take 1 tablet by mouth every 6 (six) hours as needed for severe pain.  . pantoprazole (PROTONIX) 20 MG tablet Take 1 tablet (20 mg total) by mouth daily. (Patient taking differently: Take 20 mg by mouth daily. As needed)  . potassium chloride SA (K-DUR,KLOR-CON) 20 MEQ tablet Take 1 tablet (20 mEq total) by mouth daily.  . simvastatin (ZOCOR) 10 MG tablet TAKE 1 TABLET(10 MG) BY MOUTH DAILY  . Tiotropium Bromide Monohydrate (SPIRIVA RESPIMAT) 1.25 MCG/ACT AERS Inhale 1 puff into the lungs daily.   No facility-administered medications prior to visit.    Review of Systems  Constitutional: Positive for appetite change, fatigue and unexpected weight change.  Respiratory: Negative.   Cardiovascular: Negative.   Gastrointestinal: Negative.   Musculoskeletal: Positive for back pain and gait problem.  Psychiatric/Behavioral: The patient is nervous/anxious.     Last CBC Lab Results  Component Value Date   WBC 8.4 01/14/2018   HGB 12.6 01/14/2018   HCT 36.9 01/14/2018   MCV 96.5 01/14/2018   MCH 33.0 01/14/2018   RDW 16.1 (H) 01/14/2018   PLT 256 40/98/1191   Last metabolic panel Lab Results  Component Value Date   GLUCOSE 133 (H) 01/14/2018   NA 129 (L) 01/14/2018   K 2.9 (L) 01/14/2018   CL 93 (L) 01/14/2018   CO2 24 01/14/2018   BUN 12 01/14/2018   CREATININE 1.20 (H) 11/02/2019   GFRNONAA >60 01/14/2018   GFRAA >60 01/14/2018   CALCIUM 8.5 (L) 01/14/2018   PROT 7.2 01/14/2018   ALBUMIN 3.8 01/14/2018   LABGLOB 3.0 09/26/2017   AGRATIO 1.4 09/26/2017   BILITOT 0.8 01/14/2018   ALKPHOS 76 01/14/2018   AST 24 01/14/2018   ALT 10 (L) 01/14/2018   ANIONGAP 12 01/14/2018      Objective    BP (!) 151/69 (BP  Location: Left Arm, Patient Position: Sitting, Cuff Size: Small)   Pulse 76   Temp 98.5 F (36.9 C) (Oral)   Resp 16   Wt 86 lb 12.8 oz (39.4 kg)   BMI 15.88 kg/m  BP Readings from Last 3 Encounters:  03/24/20 (!) 151/69  02/21/20 (!) 155/71  04/09/19 (!) 147/76   Wt Readings from Last 3 Encounters:  03/24/20 86 lb 12.8 oz (39.4 kg)  02/21/20 (!) 90 lb (40.8 kg)  04/09/19 88 lb (39.9 kg)      Physical Exam Vitals reviewed.  Constitutional:      General: She is not in acute distress.    Appearance: Normal appearance. She is well-developed, well-groomed and underweight. She is not diaphoretic.  HENT:     Head: Normocephalic and atraumatic.  Eyes:     General: No scleral icterus. Neck:     Thyroid: No thyromegaly.  Vascular: No JVD.     Trachea: No tracheal deviation.  Cardiovascular:     Rate and Rhythm: Normal rate and regular rhythm.     Heart sounds: Murmur heard.  Systolic murmur is present with a grade of 3/6.  No friction rub. No gallop.   Pulmonary:     Effort: Pulmonary effort is normal. No respiratory distress.     Breath sounds: Normal breath sounds. No wheezing or rales.  Musculoskeletal:     Cervical back: Normal range of motion and neck supple.  Lymphadenopathy:     Cervical: No cervical adenopathy.  Neurological:     Mental Status: She is alert.  Psychiatric:        Attention and Perception: Attention and perception normal.        Mood and Affect: Mood is anxious.        Behavior: Behavior is cooperative.      No results found for any visits on 03/24/20.  Assessment & Plan     1. Essential hypertension Slightly elevated. Reports higher readings at home. Advised to bring cuff for comparison at next visit. Continue current medical regiment. Will check labs as below and f/u pending results. F/U in 2 weeks in office.  - CBC w/Diff/Platelet - Comprehensive Metabolic Panel (CMET) - D22 and Folate Panel - Vitamin D (25 hydroxy)  2. Mixed simple  and mucopurulent chronic bronchitis (HCC) Currently stable. Continnue Spiriva, dulera, and albuterol. Have been trying since 2020 to get low dose lung cancer screenings CT. Most recent referral was placed in 01/2020. Printed letter from Burgess Estelle, RN with his phone number and highlighted for patient to call and arrange. She seems encouraged in the office to want to do this. She is still smoking. Does not desire to quit but reports she has cut back some.  - CBC w/Diff/Platelet - Comprehensive Metabolic Panel (CMET) - G25 and Folate Panel - Vitamin D (25 hydroxy)  3. Compression fracture of body of thoracic vertebra (HCC) Stable. Wanting to switch to oxycodone-apap 5-325mg  from hydrocodone-apap 5-325mg . Had been on hydrocodone for years and now just not as effective.  - CBC w/Diff/Platelet - Comprehensive Metabolic Panel (CMET) - K27 and Folate Panel - Vitamin D (25 hydroxy)  4. Pure hypercholesterolemia Stable. Continue Simvastatin 10mg . Will check labs as below and f/u pending results. - CBC w/Diff/Platelet - Comprehensive Metabolic Panel (CMET) - C62 and Folate Panel - Vitamin D (25 hydroxy)  5. Current tobacco use Does not desire to quit. Continue inhalers as noted above. Will try to get lung cancer screening CT.  - CBC w/Diff/Platelet - Comprehensive Metabolic Panel (CMET) - B76 and Folate Panel - Vitamin D (25 hydroxy)  6. Numbness and tingling In legs and hands. Will check labs as below and f/u pending results. F/U in 2 weeks.  - CBC w/Diff/Platelet - Comprehensive Metabolic Panel (CMET) - E83 and Folate Panel - Vitamin D (25 hydroxy)  7. Weight loss Has lost 4 pounds over the last month. Reports she tries to eat, just has no appetite. Encouraged protein drinks (like boost or ensure) between meals, or snacks between meals. Stay hydrated. Will check labs as below and f/u pending results. F/U in 2 weeks with weight recheck.  - CBC w/Diff/Platelet - Comprehensive Metabolic  Panel (CMET) - T51 and Folate Panel - Vitamin D (25 hydroxy)  8. Depression, major, single episode, mild ([place]) Had increased citalopram from 20mg  to 40mg . Just started the 40mg  last night. F/U in 2 weeks to see  if improving.  - Ambulatory referral to Psychiatry   No follow-ups on file.      Reynolds Bowl, PA-C, have reviewed all documentation for this visit. The documentation on 03/24/20 for the exam, diagnosis, procedures, and orders are all accurate and complete.   Rubye Beach  [facility] [place] [phone] (phone) [phone] (fax)  [place]

## 2020-03-24 ENCOUNTER — Other Ambulatory Visit: Payer: Self-pay

## 2020-03-24 ENCOUNTER — Encounter: Payer: Self-pay | Admitting: Physician Assistant

## 2020-03-24 ENCOUNTER — Ambulatory Visit (INDEPENDENT_AMBULATORY_CARE_PROVIDER_SITE_OTHER): Payer: Medicare Other | Admitting: Physician Assistant

## 2020-03-24 VITALS — BP 151/69 | HR 76 | Temp 98.5°F | Resp 16 | Wt 86.8 lb

## 2020-03-24 DIAGNOSIS — S22000A Wedge compression fracture of unspecified thoracic vertebra, initial encounter for closed fracture: Secondary | ICD-10-CM | POA: Diagnosis not present

## 2020-03-24 DIAGNOSIS — F320 Major depressive disorder, single episode, mild: Secondary | ICD-10-CM

## 2020-03-24 DIAGNOSIS — I10 Essential (primary) hypertension: Secondary | ICD-10-CM | POA: Diagnosis not present

## 2020-03-24 DIAGNOSIS — E7800 Pure hypercholesterolemia, unspecified: Secondary | ICD-10-CM | POA: Diagnosis not present

## 2020-03-24 DIAGNOSIS — R634 Abnormal weight loss: Secondary | ICD-10-CM

## 2020-03-24 DIAGNOSIS — J418 Mixed simple and mucopurulent chronic bronchitis: Secondary | ICD-10-CM

## 2020-03-24 DIAGNOSIS — R200 Anesthesia of skin: Secondary | ICD-10-CM

## 2020-03-24 DIAGNOSIS — R202 Paresthesia of skin: Secondary | ICD-10-CM

## 2020-03-24 DIAGNOSIS — Z720 Tobacco use: Secondary | ICD-10-CM

## 2020-03-24 NOTE — Patient Instructions (Signed)
[facility] at [facility] [place],    [id number] [address]: [phone] Call [place] to schedule mammogram

## 2020-03-25 LAB — CBC WITH DIFFERENTIAL/PLATELET
Basophils Absolute: 0.1 10*3/uL (ref 0.0–0.2)
Basos: 1 %
EOS (ABSOLUTE): 0.1 10*3/uL (ref 0.0–0.4)
Eos: 1 %
Hematocrit: 36.6 % (ref 34.0–46.6)
Hemoglobin: 12.5 g/dL (ref 11.1–15.9)
Immature Grans (Abs): 0 10*3/uL (ref 0.0–0.1)
Immature Granulocytes: 0 %
Lymphocytes Absolute: 2.1 10*3/uL (ref 0.7–3.1)
Lymphs: 27 %
MCH: 33 pg (ref 26.6–33.0)
MCHC: 34.2 g/dL (ref 31.5–35.7)
MCV: 97 fL (ref 79–97)
Monocytes Absolute: 0.6 10*3/uL (ref 0.1–0.9)
Monocytes: 7 %
Neutrophils Absolute: 5.1 10*3/uL (ref 1.4–7.0)
Neutrophils: 64 %
Platelets: 204 10*3/uL (ref 150–450)
RBC: 3.79 x10E6/uL (ref 3.77–5.28)
RDW: 12 % (ref 11.7–15.4)
WBC: 7.9 10*3/uL (ref 3.4–10.8)

## 2020-03-25 LAB — COMPREHENSIVE METABOLIC PANEL
ALT: 7 IU/L (ref 0–32)
AST: 16 IU/L (ref 0–40)
Albumin/Globulin Ratio: 1.9 (ref 1.2–2.2)
Albumin: 4.7 g/dL (ref 3.7–4.7)
Alkaline Phosphatase: 68 IU/L (ref 48–121)
BUN/Creatinine Ratio: 17 (ref 12–28)
BUN: 23 mg/dL (ref 8–27)
Bilirubin Total: 0.3 mg/dL (ref 0.0–1.2)
CO2: 22 mmol/L (ref 20–29)
Calcium: 9.6 mg/dL (ref 8.7–10.3)
Chloride: 93 mmol/L — ABNORMAL LOW (ref 96–106)
Creatinine, Ser: 1.38 mg/dL — ABNORMAL HIGH (ref 0.57–1.00)
GFR calc Af Amer: 44 mL/min/{1.73_m2} — ABNORMAL LOW (ref >59)
GFR calc non Af Amer: 38 mL/min/{1.73_m2} — ABNORMAL LOW (ref >59)
Globulin, Total: 2.5 g/dL (ref 1.5–4.5)
Glucose: 94 mg/dL (ref 65–99)
Potassium: 4.7 mmol/L (ref 3.5–5.2)
Sodium: 130 mmol/L — ABNORMAL LOW (ref 134–144)
Total Protein: 7.2 g/dL (ref 6.0–8.5)

## 2020-03-25 LAB — B12 AND FOLATE PANEL
Folate: 10.9 ng/mL (ref >3.0)
Vitamin B-12: 456 pg/mL (ref 232–1245)

## 2020-03-25 LAB — VITAMIN D 25 HYDROXY (VIT D DEFICIENCY, FRACTURES): Vit D, 25-Hydroxy: 24.9 ng/mL — ABNORMAL LOW (ref 30.0–100.0)

## 2020-03-29 DIAGNOSIS — J449 Chronic obstructive pulmonary disease, unspecified: Secondary | ICD-10-CM | POA: Diagnosis not present

## 2020-04-02 ENCOUNTER — Other Ambulatory Visit: Payer: Self-pay | Admitting: Physician Assistant

## 2020-04-02 DIAGNOSIS — I10 Essential (primary) hypertension: Secondary | ICD-10-CM

## 2020-04-02 DIAGNOSIS — K219 Gastro-esophageal reflux disease without esophagitis: Secondary | ICD-10-CM

## 2020-04-02 DIAGNOSIS — E7800 Pure hypercholesterolemia, unspecified: Secondary | ICD-10-CM

## 2020-04-02 NOTE — Telephone Encounter (Signed)
Requested Prescriptions  Pending Prescriptions Disp Refills  . pantoprazole (PROTONIX) 20 MG tablet [Pharmacy Med Name: PANTOPRAZOLE 20MG  TABLETS] 90 tablet 1    Sig: TAKE 1 TABLET(20 MG) BY MOUTH DAILY     Gastroenterology: Proton Pump Inhibitors Passed - 04/02/2020  7:43 AM      Passed - Valid encounter within last 12 months    Recent Outpatient Visits          1 week ago Essential hypertension   [place], Clearnce Sorrel, PA-C   1 month ago Essential hypertension   [place], [place], [place]   5 months ago Gastroesophageal reflux disease without esophagitis   [facility], Clearnce Sorrel, PA-C   1 year ago Therapeutic opioid-induced constipation (OIC)   [facility], Clearnce Sorrel, PA-C   1 year ago Age-related osteoporosis with current pathological fracture with routine healing, subsequent encounter   [facility], Clearnce Sorrel, PA-C      Future Appointments            In 4 days Marlyn Corporal, Clearnce Sorrel, PA-C [employer], [facility]           . metoprolol succinate (TOPROL-XL) 25 MG 24 hr tablet [Pharmacy Med Name: METOPROLOL ER SUCCINATE 25MG  TABS] 90 tablet     Sig: TAKE 1 TABLET BY MOUTH EVERY DAY     Cardiovascular:  Beta Blockers Failed - 04/02/2020  7:43 AM      Failed - Last BP in normal range    BP Readings from Last 1 Encounters:  03/24/20 (!) 151/69         Passed - Last Heart Rate in normal range    Pulse Readings from Last 1 Encounters:  03/24/20 76         Passed - Valid encounter within last 6 months    Recent Outpatient Visits          1 week ago Essential hypertension   [place], Clearnce Sorrel, [place]   1 month ago Essential hypertension   [place], [place], [place]   5 months ago Gastroesophageal reflux disease without esophagitis   [facility], Clearnce Sorrel, PA-C    1 year ago Therapeutic opioid-induced constipation (OIC)   [facility], Clearnce Sorrel, PA-C   1 year ago Age-related osteoporosis with current pathological fracture with routine healing, subsequent encounter   [place], PA-C      Future Appointments            In 4 days Marlyn Corporal, Clearnce Sorrel, PA-C [employer], [place]           . simvastatin (ZOCOR) 10 MG tablet [Pharmacy Med Name: SIMVASTATIN 10MG  TABLETS] 90 tablet     Sig: TAKE 1 TABLET(10 MG) BY MOUTH DAILY     Cardiovascular:  Antilipid - Statins Failed - 04/02/2020  7:43 AM      Failed - Total Cholesterol in normal range and within 360 days    Cholesterol, Total  Date Value Ref Range Status  09/26/2017 192 100 - 199 mg/dL Final         Failed - LDL in normal range and within 360 days    LDL Calculated  Date Value Ref Range Status  09/26/2017 106 (H) 0 - 99 mg/dL Final         Failed - HDL in normal range and within 360 days  HDL  Date Value Ref Range Status  09/26/2017 53 >39 mg/dL Final         Failed - Triglycerides in normal range and within 360 days    Triglycerides  Date Value Ref Range Status  09/26/2017 164 (H) 0 - 149 mg/dL Final         Passed - Patient is not pregnant      Passed - Valid encounter within last 12 months    Recent Outpatient Visits          1 week ago Essential hypertension   [place], Clearnce Sorrel, PA-C   1 month ago Essential hypertension   [place], [place], [place]   5 months ago Gastroesophageal reflux disease without esophagitis   [facility], Clearnce Sorrel, PA-C   1 year ago Therapeutic opioid-induced constipation (OIC)   [facility], Clearnce Sorrel, PA-C   1 year ago Age-related osteoporosis with current pathological fracture with routine healing, subsequent encounter   [place], PA-C      Future Appointments            In 4 days [place], Clearnce Sorrel, PA-C [employer], [place]

## 2020-04-04 ENCOUNTER — Telehealth: Payer: Self-pay

## 2020-04-04 DIAGNOSIS — Z122 Encounter for screening for malignant neoplasm of respiratory organs: Secondary | ICD-10-CM

## 2020-04-04 DIAGNOSIS — Z87891 Personal history of nicotine dependence: Secondary | ICD-10-CM

## 2020-04-04 NOTE — Telephone Encounter (Signed)
Contacted patient regarding lung CT screening clinic.  I spoke to patient who remembered her PA Lillia Abed) discussing the clinic with her.  I further explained the CT screening program and she would like to participate.  CT scan scheduled for October 26 at 2 pm.  Patient knows where the outpatient imaging center is.  She was given phone number to Burgess Estelle with lung navigation in case she needs to change her appt (she relies on her daughter for transportation and needs to confirm appt time with her daughter).  Patient is a current smoker who smokes 100% of 1 pack per day.  She started smoking at age 63.  She does share she has chronic bronchitis which some doctors have told her was COPD.  She also says she has just changed insurances from [employer] to [facility] Medicare Complete offered through [place].  Patient states she is grateful to be a part of this clinic.

## 2020-04-06 ENCOUNTER — Encounter: Payer: Self-pay | Admitting: Physician Assistant

## 2020-04-06 ENCOUNTER — Other Ambulatory Visit: Payer: Self-pay

## 2020-04-06 ENCOUNTER — Ambulatory Visit (INDEPENDENT_AMBULATORY_CARE_PROVIDER_SITE_OTHER): Payer: Medicare Other | Admitting: Physician Assistant

## 2020-04-06 VITALS — BP 166/83 | HR 70 | Temp 97.9°F | Wt 87.6 lb

## 2020-04-06 DIAGNOSIS — I10 Essential (primary) hypertension: Secondary | ICD-10-CM | POA: Diagnosis not present

## 2020-04-06 DIAGNOSIS — J418 Mixed simple and mucopurulent chronic bronchitis: Secondary | ICD-10-CM

## 2020-04-06 MED ORDER — METOPROLOL SUCCINATE ER 50 MG PO TB24: 50.0000 mg | ORAL_TABLET | Freq: Every day | ORAL | 3 refills | Status: DC

## 2020-04-06 MED ORDER — TRELEGY ELLIPTA 100-62.5-25 MCG/INH IN AEPB: 1.0000 | INHALATION_SPRAY | Freq: Every day | RESPIRATORY_TRACT | 3 refills | Status: DC

## 2020-04-06 NOTE — Progress Notes (Signed)
Established patient visit   Patient: [name] [name]   DOB: 09/27/1945   74 y.o. Female  MRN: [id number] Visit Date: 04/06/2020  Today's healthcare provider: Mar Daring, PA-C   No chief complaint on file.  Subjective    HPI  Hypertension, follow-up  BP Readings from Last 3 Encounters:  04/06/20 (!) 166/83  03/24/20 (!) 151/69  02/21/20 (!) 155/71   Wt Readings from Last 3 Encounters:  04/06/20 87 lb 9.6 oz (39.7 kg)  03/24/20 86 lb 12.8 oz (39.4 kg)  02/21/20 (!) 90 lb (40.8 kg)     She was last seen for hypertension 03/24/2020.  BP at that visit was 151/69. Management since that visit includes advising patient to stay hydrated due to decline in kidney funtion. Patient was advised to bring cuff for comparison at next visit. Continue current medical regiment.  She reports excellent compliance with treatment. She is not having side effects.  She does smoke.  Use of agents associated with hypertension: NSAIDS.   Outside blood pressures are reports that this morning her BP was 153/73 before she took her BP medicine. Symptoms: No chest pain No chest pressure  No palpitations No syncope  No dyspnea No orthopnea  No paroxysmal nocturnal dyspnea No lower extremity edema   Pertinent labs: Lab Results  Component Value Date   CHOL 192 09/26/2017   HDL 53 09/26/2017   LDLCALC 106 (H) 09/26/2017   TRIG 164 (H) 09/26/2017   CHOLHDL 3.6 09/26/2017   Lab Results  Component Value Date   NA 130 (L) 03/24/2020   K 4.7 03/24/2020   CREATININE 1.38 (H) 03/24/2020   GFRNONAA 38 (L) 03/24/2020   GFRAA 44 (L) 03/24/2020   GLUCOSE 94 03/24/2020     The 10-year ASCVD risk score Mikey Bussing DC Jr., et al., 2013) is: 38.6%   ---------------------------------------------------------------------------------------------------  Patient Active Problem List   Diagnosis Date Noted  . Encounter for breast cancer screening using non-mammogram modality 02/22/2020  . Depression,  major, single episode, mild ([place]) 02/05/2019  . Personal history of colonic polyps   . Benign neoplasm of cecum   . Benign neoplasm of transverse colon   . Benign neoplasm of ascending colon   . Compression fracture of body of thoracic vertebra (HCC) 10/28/2017  . 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 Renal cyst 10/28/2017  . Hypokalemia 10/18/2017  . Chronic congestive heart failure ([place]) 09/26/2017  . Alcohol abuse 08/28/2016  . Pedal edema 03/13/2016  . Osteoporosis 09/07/2015  . Pleural effusion 02/03/2015  . Rib fracture 02/02/2015  . Gastric ulcer 12/29/2014  . H/O transient cerebral ischemia 12/29/2014  . HLD (hyperlipidemia) 12/29/2014  . BP (high blood pressure) 12/29/2014  . Below normal amount of sodium in the blood 12/29/2014  . Awareness of heartbeats 12/29/2014  . Abnormal Pap smear of vagina 12/29/2014  . Plantar fasciitis 12/29/2014  . Esophageal reflux 12/29/2014  . 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 TI (tricuspid incompetence) 12/31/2013  . Chest pain 12/31/2013  . Chronic obstructive pulmonary disease ([place]) 12/31/2013  . Abnormal blood sugar 03/01/2009  . GAD (generalized anxiety disorder) 02/15/2009  . Current tobacco use 06/26/2006   Past Medical History:  Diagnosis Date  . Anxiety   . CAD (coronary artery disease) unk  . COPD (chronic obstructive pulmonary disease) ([place])   . Depression   . Hypercholesteremia unk  . Hypertension        Medications: Outpatient Medications Prior to Visit  Medication Sig  .  albuterol (VENTOLIN HFA) 108 (90 Base) MCG/ACT inhaler Inhale 2 puffs into the lungs every 4 (four) hours as needed for wheezing or shortness of breath.  Marland Kitchen aspirin 81 MG tablet Take 81 mg by mouth daily.  . Calcium 600-200 MG-UNIT tablet Take 1 tablet by mouth daily.  . cholecalciferol (VITAMIN D) 25 MCG (1000 UNIT) tablet Take 1,000 Units by mouth daily.  . citalopram (CELEXA) 40 MG tablet Take 1 tablet (40 mg total) by  mouth daily.  . clonazePAM (KLONOPIN) 0.5 MG tablet Take 1 tablet (0.5 mg total) by mouth 2 (two) times daily as needed for anxiety.  Mariane Baumgarten Sodium (COLACE PO) Take 100 mg by mouth daily as needed (constipation).   . furosemide (LASIX) 20 MG tablet Take 20 mg by mouth daily as needed for fluid.   Marland Kitchen ibuprofen (ADVIL,MOTRIN) 600 MG tablet Take 600 mg by mouth every 8 (eight) hours as needed.  Marland Kitchen lisinopril (ZESTRIL) 40 MG tablet TAKE 1 TABLET(40 MG) BY MOUTH DAILY  . mometasone-formoterol (DULERA) 200-5 MCG/ACT AERO Inhale 2 puffs into the lungs 2 (two) times daily.  Marland Kitchen oxyCODONE-acetaminophen (PERCOCET/ROXICET) 5-325 MG tablet Take 1 tablet by mouth every 6 (six) hours as needed for severe pain.  . pantoprazole (PROTONIX) 20 MG tablet TAKE 1 TABLET(20 MG) BY MOUTH DAILY  . potassium chloride SA (K-DUR,KLOR-CON) 20 MEQ tablet Take 1 tablet (20 mEq total) by mouth daily.  . simvastatin (ZOCOR) 10 MG tablet TAKE 1 TABLET(10 MG) BY MOUTH DAILY  . Tiotropium Bromide Monohydrate (SPIRIVA RESPIMAT) 1.25 MCG/ACT AERS Inhale 1 puff into the lungs daily.  . [DISCONTINUED] metoprolol succinate (TOPROL-XL) 25 MG 24 hr tablet TAKE 1 TABLET BY MOUTH EVERY DAY   No facility-administered medications prior to visit.    Review of Systems  Constitutional: Positive for fatigue.  HENT: Negative for congestion.   Respiratory: Positive for shortness of breath ("if she is moving a lot"). Negative for chest tightness.   Cardiovascular: Negative for chest pain, palpitations and leg swelling.  Neurological: Positive for headaches. Negative for dizziness.    Last CBC Lab Results  Component Value Date   WBC 7.9 03/24/2020   HGB 12.5 03/24/2020   HCT 36.6 03/24/2020   MCV 97 03/24/2020   MCH 33.0 03/24/2020   RDW 12.0 03/24/2020   PLT 204 40/98/1191   Last metabolic panel Lab Results  Component Value Date   GLUCOSE 94 03/24/2020   NA 130 (L) 03/24/2020   K 4.7 03/24/2020   CL 93 (L) 03/24/2020   CO2 22  03/24/2020   BUN 23 03/24/2020   CREATININE 1.38 (H) 03/24/2020   GFRNONAA 38 (L) 03/24/2020   GFRAA 44 (L) 03/24/2020   CALCIUM 9.6 03/24/2020   PROT 7.2 03/24/2020   ALBUMIN 4.7 03/24/2020   LABGLOB 2.5 03/24/2020   AGRATIO 1.9 03/24/2020   BILITOT 0.3 03/24/2020   ALKPHOS 68 03/24/2020   AST 16 03/24/2020   ALT 7 03/24/2020   ANIONGAP 12 01/14/2018      Objective    BP (!) 166/83 (BP Location: Left Arm, Patient Position: Sitting, Cuff Size: Small)   Pulse 70   Temp 97.9 F (36.6 C) (Oral)   Wt 87 lb 9.6 oz (39.7 kg)   SpO2 97%   BMI 16.02 kg/m  BP Readings from Last 3 Encounters:  04/06/20 (!) 166/83  03/24/20 (!) 151/69  02/21/20 (!) 155/71   Wt Readings from Last 3 Encounters:  04/06/20 87 lb 9.6 oz (39.7 kg)  03/24/20 86  lb 12.8 oz (39.4 kg)  02/21/20 (!) 90 lb (40.8 kg)      Physical Exam Vitals reviewed.  Constitutional:      General: She is not in acute distress.    Appearance: Normal appearance. She is well-developed, well-groomed and underweight. She is not ill-appearing or diaphoretic.  Cardiovascular:     Rate and Rhythm: Normal rate and regular rhythm.     Heart sounds: Murmur heard.  No friction rub. No gallop.   Pulmonary:     Effort: Pulmonary effort is normal. No respiratory distress.     Breath sounds: Normal breath sounds. No wheezing or rales.  Musculoskeletal:     Cervical back: Normal range of motion and neck supple.  Neurological:     Mental Status: She is alert.  Psychiatric:        Behavior: Behavior is cooperative.       No results found for any visits on 04/06/20.  Assessment & Plan     1. Essential hypertension Still elevated. Increase metoprolol XL to 50mg  from 25mg . Continue Lisinopril 40mg  and furosemide 20mg  prn. F/U in 4-6 weeks.  - metoprolol succinate (TOPROL-XL) 50 MG 24 hr tablet; Take 1 tablet (50 mg total) by mouth daily. Take with or immediately following a meal.  Dispense: 90 tablet; Refill: 3  2. Mixed  simple and mucopurulent chronic bronchitis ([place]) Stop Dulera and Spiriva. Start Trelegy. Ok to still use Albuterol rescur inhaler prn.  - Fluticasone-Umeclidin-Vilant (TRELEGY ELLIPTA) 100-62.5-25 MCG/INH AEPB; Inhale 1 puff into the lungs daily.  Dispense: 60 each; Refill: 3   Return in about 4 weeks (around 05/04/2020) for HTN.      Reynolds Bowl, PA-C, have reviewed all documentation for this visit. The documentation on 04/13/20 for the exam, diagnosis, procedures, and orders are all accurate and complete.   Rubye Beach  [facility] [phone] (phone) [phone] (fax)  [place]

## 2020-04-06 NOTE — Patient Instructions (Addendum)
Allow 2 hours between oxycodone-acetaminophen and clonazepam *DO NOT take together*  If trelegy is approved it will be ok to STOP [place] and [place] to continue Albuterol for as needed use  Will increase Metoprolol XR to 50mg  from 25mg . Will f/u in 4 weeks with telephone call. Continue Lisinopril 40mg  as well.

## 2020-04-11 NOTE — Telephone Encounter (Signed)
Current smoker, 53 pack year

## 2020-04-11 NOTE — Addendum Note (Signed)
Addended by: Lieutenant Diego on: 04/11/2020 09:04 AM   Modules accepted: Orders

## 2020-04-21 ENCOUNTER — Other Ambulatory Visit: Payer: Self-pay | Admitting: Physician Assistant

## 2020-04-21 DIAGNOSIS — F411 Generalized anxiety disorder: Secondary | ICD-10-CM

## 2020-04-21 NOTE — Telephone Encounter (Signed)
Requested medication (s) are due for refill today: yes  Requested medication (s) are on the active medication list: yes  Last refill: 03/16/20  #60  0 refills  Future visit scheduled: yes  Notes to clinic:  Not delegated    Requested Prescriptions  Pending Prescriptions Disp Refills   clonazePAM (KLONOPIN) 0.5 MG tablet 60 tablet 0    Sig: Take 1 tablet (0.5 mg total) by mouth 2 (two) times daily as needed for anxiety.      Not Delegated - Psychiatry:  Anxiolytics/Hypnotics Failed - 04/21/2020  2:59 PM      Failed - This refill cannot be delegated      Failed - Urine Drug Screen completed in last 360 days.      Passed - Valid encounter within last 6 months    Recent Outpatient Visits           2 weeks ago Mixed simple and mucopurulent chronic bronchitis [facility])   [place], [place], [place]   4 weeks ago Essential hypertension   [place], Clearnce Sorrel, [place]   2 months ago Essential hypertension   [facility] Fenton Malling M, [place]   6 months ago Gastroesophageal reflux disease without esophagitis   [facility], Clearnce Sorrel, PA-C   1 year ago Therapeutic opioid-induced constipation (OIC)   [facility], Clearnce Sorrel, PA-C       Future Appointments             In 2 weeks Marlyn Corporal, Clearnce Sorrel, PA-C [employer], [place]   In 1 month Verlon Au, NP [facility]

## 2020-04-21 NOTE — Telephone Encounter (Signed)
Medication Refill - Medication: Clonazepam 0.5mg   Has the patient contacted their pharmacy? Yes.   (Agent: If no, request that the patient contact the pharmacy for the refill.) (Agent: If yes, when and what did the pharmacy advise?)  Preferred Pharmacy (with phone number or street name): [facility] [place], [place]: Please be advised that RX refills may take up to 3 business days. We ask that you follow-up with your pharmacy.

## 2020-04-24 ENCOUNTER — Telehealth: Payer: Self-pay | Admitting: Physician Assistant

## 2020-04-24 DIAGNOSIS — S22000A Wedge compression fracture of unspecified thoracic vertebra, initial encounter for closed fracture: Secondary | ICD-10-CM

## 2020-04-24 MED ORDER — CLONAZEPAM 0.5 MG PO TABS: 0.5000 mg | ORAL_TABLET | Freq: Two times a day (BID) | ORAL | 5 refills | Status: DC | PRN

## 2020-04-24 NOTE — Telephone Encounter (Signed)
Requested medication (s) are due for refill today: yes  Requested medication (s) are on the active medication list: yes  Last refill: 03/16/20  Future visit scheduled: yes  Notes to clinic:  not delegated    Requested Prescriptions  Pending Prescriptions Disp Refills   oxyCODONE-acetaminophen (PERCOCET/ROXICET) 5-325 MG tablet 120 tablet 0    Sig: Take 1 tablet by mouth every 6 (six) hours as needed for severe pain.      Not Delegated - Analgesics:  Opioid Agonist Combinations Failed - 04/24/2020  2:24 PM      Failed - This refill cannot be delegated      Failed - Urine Drug Screen completed in last 360 days.      Passed - Valid encounter within last 6 months    Recent Outpatient Visits           2 weeks ago Mixed simple and mucopurulent chronic bronchitis [facility])   [facility] [place], Clearnce Sorrel, [place]   1 month ago Essential hypertension   [place], Clearnce Sorrel, [place]   2 months ago Essential hypertension   [facility] Fenton Malling M, [place]   6 months ago Gastroesophageal reflux disease without esophagitis   [facility], Clearnce Sorrel, PA-C   1 year ago Therapeutic opioid-induced constipation (OIC)   [facility], Clearnce Sorrel, PA-C       Future Appointments             In 1 week Marlyn Corporal, Clearnce Sorrel, PA-C [employer], [place]   In 4 weeks Verlon Au, NP [facility]

## 2020-04-24 NOTE — Telephone Encounter (Signed)
Medication Refill - Medication: oxyCODONE-acetaminophen (PERCOCET/ROXICET) 5-325 MG    Has the patient contacted their pharmacy? Yes.   (Agent: If no, request that the patient contact the pharmacy for the refill.) (Agent: If yes, when and what did the pharmacy advise?)  Preferred Pharmacy (with phone number or street name):  [facility] [place], [place] - [address]  [place] [address]  Phone: [phone] Fax: [phone]     Agent: Please be advised that RX refills may take up to 3 business days. We ask that you follow-up with your pharmacy.

## 2020-04-25 MED ORDER — OXYCODONE-ACETAMINOPHEN 5-325 MG PO TABS: 1.0000 | ORAL_TABLET | Freq: Four times a day (QID) | ORAL | 0 refills | Status: DC | PRN

## 2020-04-27 NOTE — Telephone Encounter (Addendum)
Pt is calling and [facility] [address] in [place] told pt oxycodone needs PA

## 2020-04-28 DIAGNOSIS — J449 Chronic obstructive pulmonary disease, unspecified: Secondary | ICD-10-CM | POA: Diagnosis not present

## 2020-04-28 NOTE — Telephone Encounter (Signed)
PA was received.

## 2020-05-05 ENCOUNTER — Telehealth (INDEPENDENT_AMBULATORY_CARE_PROVIDER_SITE_OTHER): Payer: Medicare Other | Admitting: Physician Assistant

## 2020-05-05 ENCOUNTER — Other Ambulatory Visit: Payer: Self-pay

## 2020-05-05 ENCOUNTER — Encounter: Payer: Self-pay | Admitting: Physician Assistant

## 2020-05-05 VITALS — BP 173/73 | HR 45 | Temp 97.5°F | Wt 88.0 lb

## 2020-05-05 DIAGNOSIS — Z23 Encounter for immunization: Secondary | ICD-10-CM

## 2020-05-05 DIAGNOSIS — S22000A Wedge compression fracture of unspecified thoracic vertebra, initial encounter for closed fracture: Secondary | ICD-10-CM

## 2020-05-05 DIAGNOSIS — I10 Essential (primary) hypertension: Secondary | ICD-10-CM

## 2020-05-05 MED ORDER — METOPROLOL SUCCINATE ER 25 MG PO TB24: 25.0000 mg | ORAL_TABLET | Freq: Every day | ORAL | 3 refills | Status: DC

## 2020-05-05 MED ORDER — OXYCODONE-ACETAMINOPHEN 5-325 MG PO TABS
1.0000 | ORAL_TABLET | Freq: Four times a day (QID) | ORAL | 0 refills | Status: DC | PRN
Start: 2020-05-05 — End: 2020-06-16

## 2020-05-05 MED ORDER — AMLODIPINE BESYLATE 5 MG PO TABS: 5.0000 mg | ORAL_TABLET | Freq: Every day | ORAL | 3 refills | Status: DC

## 2020-05-05 MED ORDER — LISINOPRIL 40 MG PO TABS: ORAL_TABLET | ORAL | 1 refills | Status: DC

## 2020-05-05 NOTE — Patient Instructions (Addendum)
STOP Metoprolol 50 mg due to low heart rate Restart Metoprolol 25mg   Make sure you are taking Lisinopril 40mg  daily  I am adding amlodipine 5mg  today. This is ok to take at bedtime with your metoprolol.   We will see you back in 8 weeks.

## 2020-05-05 NOTE — Progress Notes (Signed)
established patient visit   Patient: [name] [name]   DOB: 1946-05-15   74 y.o. Female  MRN: [id number] Visit Date: 05/05/2020  Today's healthcare provider: Mar Daring, PA-C   Chief Complaint  Patient presents with  . Hypertension   Subjective    HPI  Hypertension, follow-up  BP Readings from Last 3 Encounters:  05/05/20 (!) 173/73  04/06/20 (!) 166/83  03/24/20 (!) 151/69   Wt Readings from Last 3 Encounters:  05/05/20 88 lb (39.9 kg)  04/06/20 87 lb 9.6 oz (39.7 kg)  03/24/20 86 lb 12.8 oz (39.4 kg)     She was last seen for hypertension 4 weeks ago.  BP at that visit was 166/83. Management since that visit includes increasing metoprolol to 50mg  a day.  She reports excellent compliance with treatment. She is not having side effects.  She is following a Regular diet. She is not exercising. She does smoke.  Use of agents associated with hypertension: none.   Outside blood pressures are 150's/80's. Symptoms: No chest pain No chest pressure  No palpitations No syncope  No dyspnea No orthopnea  No paroxysmal nocturnal dyspnea No lower extremity edema   Pertinent labs: Lab Results  Component Value Date   CHOL 192 09/26/2017   HDL 53 09/26/2017   LDLCALC 106 (H) 09/26/2017   TRIG 164 (H) 09/26/2017   CHOLHDL 3.6 09/26/2017   Lab Results  Component Value Date   NA 130 (L) 03/24/2020   K 4.7 03/24/2020   CREATININE 1.38 (H) 03/24/2020   GFRNONAA 38 (L) 03/24/2020   GFRAA 44 (L) 03/24/2020   GLUCOSE 94 03/24/2020     The 10-year ASCVD risk score Mikey Bussing DC Jr., et al., 2013) is: 41.2%   ---------------------------------------------------------------------------------------------------   Patient Active Problem List   Diagnosis Date Noted  . Encounter for breast cancer screening using non-mammogram modality 02/22/2020  . Depression, major, single episode, mild ([place]) 02/05/2019  . Personal history of colonic polyps   . Benign neoplasm of cecum    . Benign neoplasm of transverse colon   . Benign neoplasm of ascending colon   . Compression fracture of body of thoracic vertebra (HCC) 10/28/2017  . 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 Renal cyst 10/28/2017  . Hypokalemia 10/18/2017  . Chronic congestive heart failure ([place]) 09/26/2017  . Alcohol abuse 08/28/2016  . Pedal edema 03/13/2016  . Osteoporosis 09/07/2015  . Pleural effusion 02/03/2015  . Rib fracture 02/02/2015  . Gastric ulcer 12/29/2014  . H/O transient cerebral ischemia 12/29/2014  . HLD (hyperlipidemia) 12/29/2014  . BP (high blood pressure) 12/29/2014  . Below normal amount of sodium in the blood 12/29/2014  . Awareness of heartbeats 12/29/2014  . Abnormal Pap smear of vagina 12/29/2014  . Plantar fasciitis 12/29/2014  . Esophageal reflux 12/29/2014  . 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 TI (tricuspid incompetence) 12/31/2013  . Chest pain 12/31/2013  . Chronic obstructive pulmonary disease ([place]) 12/31/2013  . Abnormal blood sugar 03/01/2009  . GAD (generalized anxiety disorder) 02/15/2009  . Current tobacco use 06/26/2006   Past Medical History:  Diagnosis Date  . Anxiety   . CAD (coronary artery disease) unk  . COPD (chronic obstructive pulmonary disease) ([place])   . Depression   . Hypercholesteremia unk  . Hypertension    Social History   Tobacco Use  . Smoking status: Current Every Day Smoker    Packs/day: 0.50    Years: 50.00    Pack years:  25.00  . Smokeless tobacco: Never Used  Vaping Use  . Vaping Use: Never used  Substance Use Topics  . Alcohol use: Yes    Alcohol/week: 2.0 standard drinks    Types: 2 Glasses of wine per week  . Drug use: Not Currently    Types: Marijuana    Comment: in the past   No Known Allergies   Medications: Outpatient Medications Prior to Visit  Medication Sig  . albuterol (VENTOLIN HFA) 108 (90 Base) MCG/ACT inhaler Inhale 2 puffs into the lungs every 4 (four) hours as needed for  wheezing or shortness of breath.  Marland Kitchen aspirin 81 MG tablet Take 81 mg by mouth daily.  . Calcium 600-200 MG-UNIT tablet Take 1 tablet by mouth daily.  . cholecalciferol (VITAMIN D) 25 MCG (1000 UNIT) tablet Take 1,000 Units by mouth daily.  . citalopram (CELEXA) 40 MG tablet Take 1 tablet (40 mg total) by mouth daily.  . clonazePAM (KLONOPIN) 0.5 MG tablet Take 1 tablet (0.5 mg total) by mouth 2 (two) times daily as needed for anxiety.  Mariane Baumgarten Sodium (COLACE PO) Take 100 mg by mouth daily as needed (constipation).   . Fluticasone-Umeclidin-Vilant (TRELEGY ELLIPTA) 100-62.5-25 MCG/INH AEPB Inhale 1 puff into the lungs daily.  . furosemide (LASIX) 20 MG tablet Take 20 mg by mouth daily as needed for fluid.   Marland Kitchen ibuprofen (ADVIL,MOTRIN) 600 MG tablet Take 600 mg by mouth every 8 (eight) hours as needed.  . mometasone-formoterol (DULERA) 200-5 MCG/ACT AERO Inhale 2 puffs into the lungs 2 (two) times daily.  . pantoprazole (PROTONIX) 20 MG tablet TAKE 1 TABLET(20 MG) BY MOUTH DAILY  . potassium chloride SA (K-DUR,KLOR-CON) 20 MEQ tablet Take 1 tablet (20 mEq total) by mouth daily.  . simvastatin (ZOCOR) 10 MG tablet TAKE 1 TABLET(10 MG) BY MOUTH DAILY  . Tiotropium Bromide Monohydrate (SPIRIVA RESPIMAT) 1.25 MCG/ACT AERS Inhale 1 puff into the lungs daily.  . [DISCONTINUED] lisinopril (ZESTRIL) 40 MG tablet TAKE 1 TABLET(40 MG) BY MOUTH DAILY  . [DISCONTINUED] metoprolol succinate (TOPROL-XL) 50 MG 24 hr tablet Take 1 tablet (50 mg total) by mouth daily. Take with or immediately following a meal.  . [DISCONTINUED] oxyCODONE-acetaminophen (PERCOCET/ROXICET) 5-325 MG tablet Take 1 tablet by mouth every 6 (six) hours as needed for severe pain.   No facility-administered medications prior to visit.    Review of Systems  Constitutional: Negative.   Respiratory: Positive for cough, shortness of breath and wheezing.   Cardiovascular: Positive for leg swelling. Negative for chest pain and palpitations.    Gastrointestinal: Positive for constipation and diarrhea. Negative for abdominal distention, abdominal pain, anal bleeding, blood in stool, nausea, rectal pain and vomiting.  Neurological: Positive for light-headedness and headaches (sometimes). Negative for dizziness.      Objective    BP (!) 173/73 (BP Location: Right Arm, Patient Position: Sitting, Cuff Size: Small)   Pulse (!) 45   Temp (!) 97.5 F (36.4 C) (Oral)   Wt 88 lb (39.9 kg)   BMI 16.10 kg/m    Physical Exam Vitals reviewed.  Constitutional:      General: She is not in acute distress.    Appearance: Normal appearance. She is well-developed. She is not ill-appearing or diaphoretic.  Cardiovascular:     Rate and Rhythm: Normal rate and regular rhythm.     Pulses: Normal pulses.     Heart sounds: Normal heart sounds. No murmur heard.  No friction rub. No gallop.   Pulmonary:  Effort: Pulmonary effort is normal. No respiratory distress.     Breath sounds: Normal breath sounds. No wheezing or rales.  Musculoskeletal:     Cervical back: Normal range of motion and neck supple.  Neurological:     Mental Status: She is alert.     No results found for any visits on 05/05/20.  Assessment & Plan     1. Essential hypertension Stable. Diagnosis pulled for medication refill. Continue current medical treatment plan. - metoprolol succinate (TOPROL-XL) 25 MG 24 hr tablet; Take 1 tablet (25 mg total) by mouth daily.  Dispense: 90 tablet; Refill: 3 - lisinopril (ZESTRIL) 40 MG tablet; TAKE 1 TABLET(40 MG) BY MOUTH DAILY  Dispense: 90 tablet; Refill: 1 - amLODipine (NORVASC) 5 MG tablet; Take 1 tablet (5 mg total) by mouth daily.  Dispense: 90 tablet; Refill: 3  2. Need for influenza vaccination Flu vaccine given today without complication. Patient sat upright for 15 minutes to check for adverse reaction before being released. - Flu Vaccine QUAD High Dose(Fluad)  3. Compression fracture of body of thoracic vertebra  (HCC) Stable. Diagnosis pulled for medication refill. Continue current medical treatment plan. - oxyCODONE-acetaminophen (PERCOCET/ROXICET) 5-325 MG tablet; Take 1 tablet by mouth every 6 (six) hours as needed for severe pain.  Dispense: 120 tablet; Refill: 0   Return in about 8 weeks (around 06/30/2020) for htn.      Reynolds Bowl, PA-C, have reviewed all documentation for this visit. The documentation on 05/07/20 for the exam, diagnosis, procedures, and orders are all accurate and complete.   [place]ye [place]  [facility] [phone] (phone) [phone] (fax)  [place]

## 2020-05-09 DIAGNOSIS — Z23 Encounter for immunization: Secondary | ICD-10-CM

## 2020-05-09 DIAGNOSIS — I10 Essential (primary) hypertension: Secondary | ICD-10-CM | POA: Diagnosis not present

## 2020-05-19 ENCOUNTER — Encounter: Payer: Self-pay | Admitting: Nurse Practitioner

## 2020-05-23 ENCOUNTER — Ambulatory Visit
Admission: RE | Admit: 2020-05-23 | Discharge: 2020-05-23 | Disposition: A | Discharge status: Home / Self Care | Payer: Medicare Other | Source: Ambulatory Visit | Attending: Nurse Practitioner | Admitting: Nurse Practitioner

## 2020-05-23 ENCOUNTER — Inpatient Hospital Stay: Payer: Medicare Other | Attending: Nurse Practitioner | Admitting: Hospice and Palliative Medicine

## 2020-05-23 ENCOUNTER — Other Ambulatory Visit: Payer: Self-pay

## 2020-05-23 DIAGNOSIS — Z122 Encounter for screening for malignant neoplasm of respiratory organs: Secondary | ICD-10-CM | POA: Diagnosis not present

## 2020-05-23 DIAGNOSIS — F17210 Nicotine dependence, cigarettes, uncomplicated: Secondary | ICD-10-CM | POA: Diagnosis not present

## 2020-05-23 DIAGNOSIS — Z87891 Personal history of nicotine dependence: Secondary | ICD-10-CM

## 2020-05-23 NOTE — Progress Notes (Signed)
Virtual Visit via Video Note  I connected with@ on 05/23/20 at@ by a video enabled telemedicine application and verified that I am speaking with the correct person using two identifiers.   I discussed the limitations of evaluation and management by telemedicine and the availability of in person appointments. The patient expressed understanding and agreed to proceed.  In accordance with CMS guidelines, patient has met eligibility criteria including age, absence of signs or symptoms of lung cancer.  Social History   Tobacco Use  . Smoking status: Current Every Day Smoker    Packs/day: 1.00    Years: 53.00    Pack years: 53.00    Types: Cigarettes  . Smokeless tobacco: Never Used  Vaping Use  . Vaping Use: Never used  Substance Use Topics  . Alcohol use: Yes    Alcohol/week: 2.0 standard drinks    Types: 2 Glasses of wine per week  . Drug use: Not Currently    Types: Marijuana    Comment: in the past      A shared decision-making session was conducted prior to the performance of CT scan. This includes one or more decision aids, includes benefits and harms of screening, follow-up diagnostic testing, over-diagnosis, false positive rate, and total radiation exposure.   Counseling on the importance of adherence to annual lung cancer LDCT screening, impact of co-morbidities, and ability or willingness to undergo diagnosis and treatment is imperative for compliance of the program.   Counseling on the importance of continued smoking cessation for former smokers; the importance of smoking cessation for current smokers, and information about tobacco cessation interventions have been given to patient including [place] [facility] and 1800 quit [place] programs.   Written order for lung cancer screening with LDCT has been given to the patient and any and all questions have been answered to the best of my abilities.    Yearly follow up will be coordinated by Shawn Perkins, Thoracic  Navigator.  Time Total: 15 minutes  Visit consisted of counseling and education dealing with complex health screening. Greater than 50%  of this time was spent counseling and coordinating care related to the above assessment and plan.  Signed by:  , PhD, NP-C  

## 2020-05-25 ENCOUNTER — Encounter: Payer: Self-pay | Admitting: *Deleted

## 2020-05-29 DIAGNOSIS — J449 Chronic obstructive pulmonary disease, unspecified: Secondary | ICD-10-CM | POA: Diagnosis not present

## 2020-06-16 ENCOUNTER — Other Ambulatory Visit: Payer: Self-pay | Admitting: Physician Assistant

## 2020-06-16 DIAGNOSIS — S22000A Wedge compression fracture of unspecified thoracic vertebra, initial encounter for closed fracture: Secondary | ICD-10-CM

## 2020-06-16 DIAGNOSIS — E7800 Pure hypercholesterolemia, unspecified: Secondary | ICD-10-CM

## 2020-06-16 NOTE — Telephone Encounter (Signed)
Requested medication (s) are due for refill today: yes  Requested medication (s) are on the active medication list: yes  Last refill: Oxy 05/05/20  #120  0 refills,  Zocor 02/10/20  #90 0 refills  Future visit scheduled:yes  Notes to clinic:  Oxy not delegated.  Patient labs last checked 2019    Requested Prescriptions  Pending Prescriptions Disp Refills   oxyCODONE-acetaminophen (PERCOCET/ROXICET) 5-325 MG tablet 120 tablet 0    Sig: Take 1 tablet by mouth every 6 (six) hours as needed for severe pain.      Not Delegated - Analgesics:  Opioid Agonist Combinations Failed - 06/16/2020  4:46 PM      Failed - This refill cannot be delegated      Failed - Urine Drug Screen completed in last 360 days      Passed - Valid encounter within last 6 months    Recent Outpatient Visits           1 month ago Need for influenza vaccination   [facility] F[place] M, PA-C   2 months ago Mixed simple and mucopurulent chronic bronchitis [facility])   [facility] [place], [place], [place]   2 months ago Essential hypertension   [place], Clearnce Sorrel, [place]   3 months ago Essential hypertension   [facility] Fenton Malling M, [place]   8 months ago Gastroesophageal reflux disease without esophagitis   [facility], [place], [place]                simvastatin (ZOCOR) 10 MG tablet 90 tablet 0    Sig: TAKE 1 TABLET(10 MG) BY MOUTH DAILY      Cardiovascular:  Antilipid - Statins Failed - 06/16/2020  4:46 PM      Failed - Total Cholesterol in normal range and within 360 days    Cholesterol, Total  Date Value Ref Range Status  09/26/2017 192 100 - 199 mg/dL Final          Failed - LDL in normal range and within 360 days    LDL Calculated  Date Value Ref Range Status  09/26/2017 106 (H) 0 - 99 mg/dL Final          Failed - HDL in normal range and within 360 days    HDL  Date Value Ref  Range Status  09/26/2017 53 >39 mg/dL Final          Failed - Triglycerides in normal range and within 360 days    Triglycerides  Date Value Ref Range Status  09/26/2017 164 (H) 0 - 149 mg/dL Final          Passed - Patient is not pregnant      Passed - Valid encounter within last 12 months    Recent Outpatient Visits           1 month ago Need for influenza vaccination   [facility] Fenton Malling M, [place]   2 months ago Mixed simple and mucopurulent chronic bronchitis [facility])   [place], Clearnce Sorrel, [place]   2 months ago Essential hypertension   [place], [place], [place]   3 months ago Essential hypertension   [place], [place], [place]   8 months ago Gastroesophageal reflux disease without esophagitis   [facility], [place], [place]

## 2020-06-16 NOTE — Telephone Encounter (Signed)
Copied from [place] [id number]. Topic: Quick Communication - Rx Refill/Question >> Jun 16, 2020  4:11 PM Leward Quan A wrote: Medication: oxyCODONE-acetaminophen (PERCOCET/ROXICET) 5-325 MG tablet, simvastatin (ZOCOR) 10 MG tablet   Has the patient contacted their pharmacy? Yes.   (Agent: If no, request that the patient contact the pharmacy for the refill.) (Agent: If yes, when and what did the pharmacy advise?)  Preferred Pharmacy (with phone number or street name): [facility] [place], [place] - [address] AT [address]  Phone:  [phone] Fax:  [phone]     Agent: Please be advised that RX refills may take up to 3 business days. We ask that you follow-up with your pharmacy.

## 2020-06-19 MED ORDER — OXYCODONE-ACETAMINOPHEN 5-325 MG PO TABS: 1.0000 | ORAL_TABLET | Freq: Four times a day (QID) | ORAL | 0 refills | Status: DC | PRN

## 2020-06-19 MED ORDER — SIMVASTATIN 10 MG PO TABS: ORAL_TABLET | ORAL | 0 refills | Status: DC

## 2020-06-28 DIAGNOSIS — J449 Chronic obstructive pulmonary disease, unspecified: Secondary | ICD-10-CM | POA: Diagnosis not present

## 2020-06-29 ENCOUNTER — Encounter: Payer: Self-pay | Admitting: Physician Assistant

## 2020-07-01 ENCOUNTER — Other Ambulatory Visit: Payer: Self-pay | Admitting: Physician Assistant

## 2020-07-01 DIAGNOSIS — E7800 Pure hypercholesterolemia, unspecified: Secondary | ICD-10-CM

## 2020-07-01 DIAGNOSIS — F411 Generalized anxiety disorder: Secondary | ICD-10-CM

## 2020-07-04 ENCOUNTER — Telehealth: Payer: Self-pay | Admitting: Urology

## 2020-07-04 DIAGNOSIS — N281 Cyst of kidney, acquired: Secondary | ICD-10-CM

## 2020-07-04 NOTE — Telephone Encounter (Signed)
I received notice of an expired MRI order.  She was seen in September 2020 with a complex renal cyst and a follow-up MRI was recommended June 2021.  Unless she is seeing another urologic provider would recommend scheduling MRI and follow-up visit.  If she is agreeable I will need to place a new order

## 2020-07-05 NOTE — Telephone Encounter (Signed)
Not that I am aware of, they called her 3 times to schedule and she never called back or the order expired. I will reach out to her and see what she wants to do.  thanks

## 2020-07-07 ENCOUNTER — Encounter: Payer: Self-pay | Admitting: Physician Assistant

## 2020-07-07 ENCOUNTER — Ambulatory Visit (INDEPENDENT_AMBULATORY_CARE_PROVIDER_SITE_OTHER): Payer: Medicare Other | Admitting: Physician Assistant

## 2020-07-07 ENCOUNTER — Other Ambulatory Visit: Payer: Self-pay

## 2020-07-07 VITALS — BP 170/81 | HR 60 | Temp 97.9°F | Resp 16 | Wt 87.2 lb

## 2020-07-07 DIAGNOSIS — S22000A Wedge compression fracture of unspecified thoracic vertebra, initial encounter for closed fracture: Secondary | ICD-10-CM

## 2020-07-07 DIAGNOSIS — F411 Generalized anxiety disorder: Secondary | ICD-10-CM | POA: Diagnosis not present

## 2020-07-07 DIAGNOSIS — Z23 Encounter for immunization: Secondary | ICD-10-CM | POA: Diagnosis not present

## 2020-07-07 DIAGNOSIS — I10 Essential (primary) hypertension: Secondary | ICD-10-CM

## 2020-07-07 MED ORDER — HYDROCODONE-ACETAMINOPHEN 5-325 MG PO TABS: 1.0000 | ORAL_TABLET | Freq: Four times a day (QID) | ORAL | 0 refills | Status: DC | PRN

## 2020-07-07 MED ORDER — ALPRAZOLAM 0.5 MG PO TABS: 0.5000 mg | ORAL_TABLET | Freq: Two times a day (BID) | ORAL | 5 refills | Status: DC | PRN

## 2020-07-07 NOTE — Progress Notes (Signed)
Established patient visit   Patient: [name] [name]   DOB: August 12, 1945   74 y.o. Female  MRN: [id number] Visit Date: 07/07/2020  Today's healthcare provider: Mar Daring, PA-C   No chief complaint on file.  Subjective    HPI  Hypertension, follow-up  BP Readings from Last 3 Encounters:  07/07/20 (!) 170/81  05/05/20 (!) 173/73  04/06/20 (!) 166/83   Wt Readings from Last 3 Encounters:  07/07/20 87 lb 3.2 oz (39.6 kg)  05/23/20 88 lb (39.9 kg)  05/05/20 88 lb (39.9 kg)     She was last seen for hypertension 4-6  weeks ago.  BP at that visit was 166/83. Management since that visit includes Increase metoprolol XL to 50mg  from 25mg . Continue Lisinopril 40mg  and furosemide 20mg  prn.   She reports excellent compliance with treatment. She is not having side effects.  She does smoke.   Outside blood pressures are 140's-150's/70-80. Symptoms: No chest pain No chest pressure  No palpitations No syncope  No dyspnea No orthopnea  No paroxysmal nocturnal dyspnea No lower extremity edema   Pertinent labs: Lab Results  Component Value Date   CHOL 192 09/26/2017   HDL 53 09/26/2017   LDLCALC 106 (H) 09/26/2017   TRIG 164 (H) 09/26/2017   CHOLHDL 3.6 09/26/2017   Lab Results  Component Value Date   NA 130 (L) 03/24/2020   K 4.7 03/24/2020   CREATININE 1.38 (H) 03/24/2020   GFRNONAA 38 (L) 03/24/2020   GFRAA 44 (L) 03/24/2020   GLUCOSE 94 03/24/2020     The 10-year ASCVD risk score Mikey Bussing DC Jr., et al., 2013) is: 40.1%   ---------------------------------------------------------------------------------------------------  Patient Active Problem List   Diagnosis Date Noted  . Encounter for breast cancer screening using non-mammogram modality 02/22/2020  . Depression, major, single episode, mild ([place]) 02/05/2019  . Personal history of colonic polyps   . Benign neoplasm of cecum   . Benign neoplasm of transverse colon   . Benign neoplasm of ascending colon    . Compression fracture of body of thoracic vertebra (HCC) 10/28/2017  . 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 Renal cyst 10/28/2017  . Hypokalemia 10/18/2017  . Chronic congestive heart failure ([place]) 09/26/2017  . Alcohol abuse 08/28/2016  . Pedal edema 03/13/2016  . Osteoporosis 09/07/2015  . Pleural effusion 02/03/2015  . Rib fracture 02/02/2015  . Gastric ulcer 12/29/2014  . H/O transient cerebral ischemia 12/29/2014  . HLD (hyperlipidemia) 12/29/2014  . BP (high blood pressure) 12/29/2014  . Below normal amount of sodium in the blood 12/29/2014  . Awareness of heartbeats 12/29/2014  . Abnormal Pap smear of vagina 12/29/2014  . Plantar fasciitis 12/29/2014  . Esophageal reflux 12/29/2014  . 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 TI (tricuspid incompetence) 12/31/2013  . Chest pain 12/31/2013  . Chronic obstructive pulmonary disease ([place]) 12/31/2013  . Abnormal blood sugar 03/01/2009  . GAD (generalized anxiety disorder) 02/15/2009  . Current tobacco use 06/26/2006   Past Medical History:  Diagnosis Date  . Anxiety   . CAD (coronary artery disease) unk  . COPD (chronic obstructive pulmonary disease) ([place])   . Depression   . Hypercholesteremia unk  . Hypertension        Medications: Outpatient Medications Prior to Visit  Medication Sig  . albuterol (VENTOLIN HFA) 108 (90 Base) MCG/ACT inhaler Inhale 2 puffs into the lungs every 4 (four) hours as needed for wheezing or shortness of breath.  Marland Kitchen amLODipine (NORVASC)  5 MG tablet Take 1 tablet (5 mg total) by mouth daily.  Marland Kitchen aspirin 81 MG tablet Take 81 mg by mouth daily.  . Calcium 600-200 MG-UNIT tablet Take 1 tablet by mouth daily.  . cholecalciferol (VITAMIN D) 25 MCG (1000 UNIT) tablet Take 1,000 Units by mouth daily.  . citalopram (CELEXA) 40 MG tablet Take 1 tablet (40 mg total) by mouth daily.  . clonazePAM (KLONOPIN) 0.5 MG tablet Take 1 tablet (0.5 mg total) by mouth 2 (two) times daily as  needed for anxiety.  Mariane Baumgarten Sodium (COLACE PO) Take 100 mg by mouth daily as needed (constipation).   . Fluticasone-Umeclidin-Vilant (TRELEGY ELLIPTA) 100-62.5-25 MCG/INH AEPB Inhale 1 puff into the lungs daily.  . furosemide (LASIX) 20 MG tablet Take 20 mg by mouth daily as needed for fluid.   Marland Kitchen ibuprofen (ADVIL,MOTRIN) 600 MG tablet Take 600 mg by mouth every 8 (eight) hours as needed.  Marland Kitchen lisinopril (ZESTRIL) 40 MG tablet TAKE 1 TABLET(40 MG) BY MOUTH DAILY  . metoprolol succinate (TOPROL-XL) 25 MG 24 hr tablet Take 1 tablet (25 mg total) by mouth daily.  . mometasone-formoterol (DULERA) 200-5 MCG/ACT AERO Inhale 2 puffs into the lungs 2 (two) times daily.  Marland Kitchen oxyCODONE-acetaminophen (PERCOCET/ROXICET) 5-325 MG tablet Take 1 tablet by mouth every 6 (six) hours as needed for severe pain.  . pantoprazole (PROTONIX) 20 MG tablet TAKE 1 TABLET(20 MG) BY MOUTH DAILY  . potassium chloride SA (K-DUR,KLOR-CON) 20 MEQ tablet Take 1 tablet (20 mEq total) by mouth daily.  . simvastatin (ZOCOR) 10 MG tablet TAKE 1 TABLET(10 MG) BY MOUTH DAILY  . Tiotropium Bromide Monohydrate (SPIRIVA RESPIMAT) 1.25 MCG/ACT AERS Inhale 1 puff into the lungs daily.   No facility-administered medications prior to visit.    Review of Systems  Constitutional: Positive for fatigue.  Respiratory: Positive for shortness of breath.   Cardiovascular: Negative.   Gastrointestinal: Negative.   Musculoskeletal: Positive for back pain and gait problem.  Neurological: Positive for weakness and light-headedness ("at night when she gets up to go to the bathroom").    Last CBC Lab Results  Component Value Date   WBC 7.9 03/24/2020   HGB 12.5 03/24/2020   HCT 36.6 03/24/2020   MCV 97 03/24/2020   MCH 33.0 03/24/2020   RDW 12.0 03/24/2020   PLT 204 20/60/1561   Last metabolic panel Lab Results  Component Value Date   GLUCOSE 94 03/24/2020   NA 130 (L) 03/24/2020   K 4.7 03/24/2020   CL 93 (L) 03/24/2020   CO2 22  03/24/2020   BUN 23 03/24/2020   CREATININE 1.38 (H) 03/24/2020   GFRNONAA 38 (L) 03/24/2020   GFRAA 44 (L) 03/24/2020   CALCIUM 9.6 03/24/2020   PROT 7.2 03/24/2020   ALBUMIN 4.7 03/24/2020   LABGLOB 2.5 03/24/2020   AGRATIO 1.9 03/24/2020   BILITOT 0.3 03/24/2020   ALKPHOS 68 03/24/2020   AST 16 03/24/2020   ALT 7 03/24/2020   ANIONGAP 12 01/14/2018      Objective    BP (!) 170/81 (BP Location: Left Arm, Patient Position: Sitting, Cuff Size: Normal)   Pulse 60   Temp 97.9 F (36.6 C) (Oral)   Resp 16   Wt 87 lb 3.2 oz (39.6 kg)   BMI 15.95 kg/m  BP Readings from Last 3 Encounters:  07/07/20 (!) 170/81  05/05/20 (!) 173/73  04/06/20 (!) 166/83   Wt Readings from Last 3 Encounters:  07/07/20 87 lb 3.2 oz (39.6 kg)  05/23/20 88 lb (39.9 kg)  05/05/20 88 lb (39.9 kg)      Physical Exam Vitals reviewed.  Constitutional:      General: She is not in acute distress.    Appearance: Normal appearance. She is well-developed, well-groomed, underweight and well-nourished. She is not ill-appearing or diaphoretic.  HENT:     Head: Normocephalic and atraumatic.  Cardiovascular:     Rate and Rhythm: Normal rate and regular rhythm.     Heart sounds: Murmur heard.  No friction rub. No gallop.   Pulmonary:     Effort: Pulmonary effort is normal. No respiratory distress.     Breath sounds: Rhonchi present. No wheezing or rales.  Musculoskeletal:     Cervical back: Normal range of motion and neck supple.     Right lower leg: No edema.     Left lower leg: No edema.  Neurological:     Mental Status: She is alert.  Psychiatric:        Behavior: Behavior is cooperative.     No results found for any visits on 07/07/20.  Assessment & Plan     1. Primary hypertension Elevated today in the office, but home readings run better. Continue amlodipine 5mg , lisinopril 40mg , metoprolol 25mg  daily.   2. Compression fracture of body of thoracic vertebra (HCC) Stable. Diagnosis  pulled for medication refill. Continue current medical treatment plan. - HYDROcodone-acetaminophen (NORCO/VICODIN) 5-325 MG tablet; Take 1 tablet by mouth every 6 (six) hours as needed for moderate pain.  Dispense: 120 tablet; Refill: 0  3. GAD (generalized anxiety disorder) Stable. Diagnosis pulled for medication refill. Continue current medical treatment plan. - ALPRAZolam (XANAX) 0.5 MG tablet; Take 1 tablet (0.5 mg total) by mouth 2 (two) times daily as needed for anxiety.  Dispense: 60 tablet; Refill: 5  4. Need for COVID-19 vaccine Vaccine given to patient without complications. Patient sat for 15 minutes after administration and was tolerated well without adverse effects. - Pfizer SARS-COV-2 Vaccine   No follow-ups on file.      Reynolds Bowl, PA-C, have reviewed all documentation for this visit. The documentation on 07/31/20 for the exam, diagnosis, procedures, and orders are all accurate and complete.   Rubye Beach  [facility] [phone] (phone) [phone] (fax)  [place]

## 2020-07-12 NOTE — Telephone Encounter (Signed)
Did you ever place the new order for the MRI? I don't see it

## 2020-07-29 DIAGNOSIS — J449 Chronic obstructive pulmonary disease, unspecified: Secondary | ICD-10-CM | POA: Diagnosis not present

## 2020-07-31 ENCOUNTER — Encounter: Payer: Self-pay | Admitting: Physician Assistant

## 2020-08-21 ENCOUNTER — Other Ambulatory Visit: Payer: Self-pay | Admitting: Physician Assistant

## 2020-08-21 DIAGNOSIS — S22000A Wedge compression fracture of unspecified thoracic vertebra, initial encounter for closed fracture: Secondary | ICD-10-CM

## 2020-08-21 MED ORDER — HYDROCODONE-ACETAMINOPHEN 5-325 MG PO TABS: 1.0000 | ORAL_TABLET | Freq: Four times a day (QID) | ORAL | 0 refills | Status: DC | PRN

## 2020-08-21 NOTE — Telephone Encounter (Signed)
Requested medication (s) are due for refill today -yes  Requested medication (s) are on the active medication list -yes  Future visit scheduled -no  Last refill: 07/07/20  Notes to clinic: Rx RF- non delegated Rx  Requested Prescriptions  Pending Prescriptions Disp Refills   HYDROcodone-acetaminophen (NORCO/VICODIN) 5-325 MG tablet 120 tablet 0    Sig: Take 1 tablet by mouth every 6 (six) hours as needed for moderate pain.      Not Delegated - Analgesics:  Opioid Agonist Combinations Failed - 08/21/2020 12:05 PM      Failed - This refill cannot be delegated      Failed - Urine Drug Screen completed in last 360 days      Passed - Valid encounter within last 6 months    Recent Outpatient Visits           1 month ago Primary hypertension   [place], Clearnce Sorrel, PA-C   3 months ago Need for influenza vaccination   [facility] Fenton Malling M, [place]   4 months ago Mixed simple and mucopurulent chronic bronchitis [facility])   [facility] [place], [place], [place]   5 months ago Essential hypertension   [facility] Fenton Malling M, [place]   6 months ago Essential hypertension   [facility] Fenton Malling M, [place]                    Requested Prescriptions  Pending Prescriptions Disp Refills   HYDROcodone-acetaminophen (NORCO/VICODIN) 5-325 MG tablet 120 tablet 0    Sig: Take 1 tablet by mouth every 6 (six) hours as needed for moderate pain.      Not Delegated - Analgesics:  Opioid Agonist Combinations Failed - 08/21/2020 12:05 PM      Failed - This refill cannot be delegated      Failed - Urine Drug Screen completed in last 360 days      Passed - Valid encounter within last 6 months    Recent Outpatient Visits           1 month ago Primary hypertension   [place], Clearnce Sorrel, PA-C   3 months ago Need for influenza vaccination   [place], [place]   4 months ago Mixed simple and mucopurulent chronic bronchitis [facility])   [place], [place], [place]   5 months ago Essential hypertension   [place], Clearnce Sorrel, [place]   6 months ago Essential hypertension   [facility] [place], [place], [place]

## 2020-08-21 NOTE — Telephone Encounter (Signed)
Medication Refill - Medication: Hydrocodone  Has the patient contacted their pharmacy? No. (Agent: If no, request that the patient contact the pharmacy for the refill.) (Agent: If yes, when and what did the pharmacy advise?)  Preferred Pharmacy (with phone number or street name)[facility] [place], [place] AT [place]: Please be advised that RX refills may take up to 3 business days. We ask that you follow-up with your pharmacy.

## 2020-08-29 DIAGNOSIS — J449 Chronic obstructive pulmonary disease, unspecified: Secondary | ICD-10-CM | POA: Diagnosis not present

## 2020-08-31 ENCOUNTER — Other Ambulatory Visit: Payer: Self-pay

## 2020-08-31 ENCOUNTER — Ambulatory Visit
Admission: RE | Admit: 2020-08-31 | Discharge: 2020-08-31 | Disposition: A | Discharge status: Home / Self Care | Payer: Medicare Other | Source: Ambulatory Visit | Attending: Urology | Admitting: Urology

## 2020-08-31 DIAGNOSIS — N281 Cyst of kidney, acquired: Secondary | ICD-10-CM

## 2020-08-31 DIAGNOSIS — I700 Atherosclerosis of aorta: Secondary | ICD-10-CM | POA: Diagnosis not present

## 2020-08-31 DIAGNOSIS — N2883 Nephroptosis: Secondary | ICD-10-CM | POA: Diagnosis not present

## 2020-08-31 DIAGNOSIS — N261 Atrophy of kidney (terminal): Secondary | ICD-10-CM | POA: Diagnosis not present

## 2020-08-31 MED ORDER — GADOBUTROL 1 MMOL/ML IV SOLN
4.0000 mL | Freq: Once | INTRAVENOUS | Status: AC | PRN
  Administered 2020-08-31: 4 mL via INTRAVENOUS

## 2020-09-03 ENCOUNTER — Encounter: Payer: Self-pay | Admitting: Urology

## 2020-09-18 ENCOUNTER — Other Ambulatory Visit: Payer: Self-pay | Admitting: Physician Assistant

## 2020-09-18 DIAGNOSIS — S22000A Wedge compression fracture of unspecified thoracic vertebra, initial encounter for closed fracture: Secondary | ICD-10-CM

## 2020-09-18 MED ORDER — HYDROCODONE-ACETAMINOPHEN 5-325 MG PO TABS: 1.0000 | ORAL_TABLET | Freq: Four times a day (QID) | ORAL | 0 refills | Status: DC | PRN

## 2020-09-18 NOTE — Telephone Encounter (Signed)
Requested medication (s) are due for refill today: yes  Requested medication (s) are on the active medication list: yes  Last refill:  08/22/19  Future visit scheduled: no  Notes to clinic: not delegated    Requested Prescriptions  Pending Prescriptions Disp Refills   HYDROcodone-acetaminophen (NORCO/VICODIN) 5-325 MG tablet 120 tablet 0    Sig: Take 1 tablet by mouth every 6 (six) hours as needed for moderate pain.      Not Delegated - Analgesics:  Opioid Agonist Combinations Failed - 09/18/2020  1:51 PM      Failed - This refill cannot be delegated      Failed - Urine Drug Screen completed in last 360 days      Passed - Valid encounter within last 6 months    Recent Outpatient Visits           2 months ago Primary hypertension   [place], Clearnce Sorrel, [place]   4 months ago Need for influenza vaccination   [place], [place]   5 months ago Mixed simple and mucopurulent chronic bronchitis [facility])   [place], [place], [place]   5 months ago Essential hypertension   [place], Clearnce Sorrel, [place]   7 months ago Essential hypertension   [facility] [place], [place], [place]

## 2020-09-18 NOTE — Telephone Encounter (Signed)
Copied from [place] [id number]. Topic: Quick Communication - Rx Refill/Question >> Sep 18, 2020  1:23 PM Yvette Rack wrote: Medication: HYDROcodone-acetaminophen (NORCO/VICODIN) 5-325 MG tablet  Has the patient contacted their pharmacy? yes   Preferred Pharmacy (with phone number or street name): [facility] ST[facility] [place], [place] Phone: [phone]  Fax: [phone]  Agent: Please be advised that RX refills may take up to 3 business days. We ask that you follow-up with your pharmacy.

## 2020-09-18 NOTE — Telephone Encounter (Signed)
Encounter opened in error

## 2020-09-25 ENCOUNTER — Telehealth: Payer: Self-pay

## 2020-09-25 DIAGNOSIS — E7800 Pure hypercholesterolemia, unspecified: Secondary | ICD-10-CM

## 2020-09-25 NOTE — Telephone Encounter (Signed)
[facility] faxed refill request for the following medications:   simvastatin (ZOCOR) 10 MG tablet     Please advise.  

## 2020-09-26 DIAGNOSIS — J449 Chronic obstructive pulmonary disease, unspecified: Secondary | ICD-10-CM | POA: Diagnosis not present

## 2020-09-26 MED ORDER — SIMVASTATIN 10 MG PO TABS: ORAL_TABLET | ORAL | 0 refills | Status: DC

## 2020-09-29 ENCOUNTER — Other Ambulatory Visit: Payer: Self-pay | Admitting: Physician Assistant

## 2020-09-29 DIAGNOSIS — I10 Essential (primary) hypertension: Secondary | ICD-10-CM

## 2020-09-29 DIAGNOSIS — K219 Gastro-esophageal reflux disease without esophagitis: Secondary | ICD-10-CM

## 2020-09-29 NOTE — Telephone Encounter (Signed)
Requested Prescriptions  Pending Prescriptions Disp Refills  . pantoprazole (PROTONIX) 20 MG tablet [Pharmacy Med Name: PANTOPRAZOLE 20MG  TABLETS] 90 tablet 1    Sig: TAKE 1 TABLET(20 MG) BY MOUTH DAILY     Gastroenterology: Proton Pump Inhibitors Passed - 09/29/2020  7:02 AM      Passed - Valid encounter within last 12 months    Recent Outpatient Visits          2 months ago Primary hypertension   [facility] [place], Anderson Malta M, PA-C   4 months ago Need for influenza vaccination   [facility] Fenton Malling M, [place]   5 months ago Mixed simple and mucopurulent chronic bronchitis [facility])   [facility] [place], [place], [place]   6 months ago Essential hypertension   [facility] Fenton Malling M, [place]   7 months ago Essential hypertension   [place], Anderson Malta M, PA-C             . lisinopril (ZESTRIL) 40 MG tablet [Pharmacy Med Name: LISINOPRIL 40MG  TABLETS] 90 tablet 1    Sig: TAKE 1 TABLET(40 MG) BY MOUTH DAILY     Cardiovascular:  ACE Inhibitors Failed - 09/29/2020  7:02 AM      Failed - Cr in normal range and within 180 days    Creat  Date Value Ref Range Status  05/20/2017 0.92 0.60 - 0.93 mg/dL Final    Comment:    For patients >83 years of age, the reference limit for Creatinine is approximately 13% higher for people identified as African-American. .    Creatinine, Ser  Date Value Ref Range Status  03/24/2020 1.38 (H) 0.57 - 1.00 mg/dL Final         Failed - K in normal range and within 180 days    Potassium  Date Value Ref Range Status  03/24/2020 4.7 3.5 - 5.2 mmol/L Final  11/04/2013 4.0 3.5 - 5.1 mmol/L Final         Failed - Last BP in normal range    BP Readings from Last 1 Encounters:  07/07/20 (!) 170/81         Passed - Patient is not pregnant      Passed - Valid encounter within last 6 months    Recent Outpatient Visits          2 months ago  Primary hypertension   [place], [place], [place]   4 months ago Need for influenza vaccination   [place], [place]   5 months ago Mixed simple and mucopurulent chronic bronchitis [facility])   [place], [place], [place]   6 months ago Essential hypertension   [place], [place], [place]   7 months ago Essential hypertension   [place], [place], [place]

## 2020-10-09 ENCOUNTER — Other Ambulatory Visit: Payer: Self-pay | Admitting: Physician Assistant

## 2020-10-09 DIAGNOSIS — F411 Generalized anxiety disorder: Secondary | ICD-10-CM

## 2020-10-09 DIAGNOSIS — S22000A Wedge compression fracture of unspecified thoracic vertebra, initial encounter for closed fracture: Secondary | ICD-10-CM

## 2020-10-09 NOTE — Telephone Encounter (Signed)
Requested medication (s) are due for refill today: Hydrocodone yes   Xanax No  Requested medication (s) are on the active medication list:  yes, both  Last refill:Hydrocodone 09/18/20  #120  0 refills    Xanax  07/07/20  #60  5 refills  Future visit scheduled  01/31/21 with nurse  Notes to clinic: not delegated.  Requested Prescriptions  Pending Prescriptions Disp Refills   HYDROcodone-acetaminophen (NORCO/VICODIN) 5-325 MG tablet 120 tablet 0    Sig: Take 1 tablet by mouth every 6 (six) hours as needed for moderate pain.      Not Delegated - Analgesics:  Opioid Agonist Combinations Failed - 10/09/2020  5:08 PM      Failed - This refill cannot be delegated      Failed - Urine Drug Screen completed in last 360 days      Passed - Valid encounter within last 6 months    Recent Outpatient Visits           3 months ago Primary hypertension   [place], Anderson Malta M, PA-C   5 months ago Need for influenza vaccination   [facility] Fenton Malling M, [place]   6 months ago Mixed simple and mucopurulent chronic bronchitis [facility])   [facility] [place], [place], [place]   6 months ago Essential hypertension   [facility] Fenton Malling M, [place]   7 months ago Essential hypertension   [facility] Fenton Malling M, PA-C                  ALPRAZolam Duanne Moron) 0.5 MG tablet 60 tablet 5    Sig: Take 1 tablet (0.5 mg total) by mouth 2 (two) times daily as needed for anxiety.      Not Delegated - Psychiatry:  Anxiolytics/Hypnotics Failed - 10/09/2020  5:08 PM      Failed - This refill cannot be delegated      Failed - Urine Drug Screen completed in last 360 days      Passed - Valid encounter within last 6 months    Recent Outpatient Visits           3 months ago Primary hypertension   [place], Clearnce Sorrel, PA-C   5 months ago Need for influenza vaccination    [place], [place]   6 months ago Mixed simple and mucopurulent chronic bronchitis [facility])   [place], [place], [place]   6 months ago Essential hypertension   [place], Clearnce Sorrel, [place]   7 months ago Essential hypertension   [facility] [place], [place], [place]

## 2020-10-09 NOTE — Telephone Encounter (Signed)
Copied from [place] [phone]. Topic: Quick Communication - Rx Refill/Question >> Oct 09, 2020  4:42 PM Mcneil, Ja-Kwan wrote: Medication: ALPRAZolam (XANAX) 0.5 MG tablet and HYDROcodone-acetaminophen (NORCO/VICODIN) 5-325 MG tablet  Has the patient contacted their pharmacy? yes - Per pt pharmacy will not fill without approval due to it being a controlled substance  Preferred Pharmacy (with phone number or street name): [facility] [place], [place] [address] [place] AT [place] Phone: [phone]  Fax: [phone]  Agent: Please be advised that RX refills may take up to 3 business days. We ask that you follow-up with your pharmacy.

## 2020-10-10 MED ORDER — HYDROCODONE-ACETAMINOPHEN 5-325 MG PO TABS
1.0000 | ORAL_TABLET | Freq: Four times a day (QID) | ORAL | 0 refills | Status: DC | PRN
Start: 2020-10-10 — End: 2020-10-19

## 2020-10-10 MED ORDER — ALPRAZOLAM 0.5 MG PO TABS: 0.5000 mg | ORAL_TABLET | Freq: Two times a day (BID) | ORAL | 5 refills | Status: DC | PRN

## 2020-10-19 ENCOUNTER — Telehealth: Payer: Self-pay | Admitting: Physician Assistant

## 2020-10-19 DIAGNOSIS — S22000A Wedge compression fracture of unspecified thoracic vertebra, initial encounter for closed fracture: Secondary | ICD-10-CM

## 2020-10-19 MED ORDER — HYDROCODONE-ACETAMINOPHEN 5-325 MG PO TABS: 1.0000 | ORAL_TABLET | Freq: Four times a day (QID) | ORAL | 0 refills | Status: DC | PRN

## 2020-10-19 NOTE — Telephone Encounter (Signed)
Disp Refills Start End   HYDROcodone-acetaminophen (NORCO/VICODIN) 5-325 MG tablet 120 tablet 0 10/10/2020    Sig - Route: Take 1 tablet by mouth every 6 (six) hours as needed for moderate pain. - Oral   Sent to pharmacy as: HYDROcodone-acetaminophen (NORCO/VICODIN) 5-325 MG tablet   Earliest Fill Date: 10/10/2020   Notes to Pharmacy: Note change back from Oxycodone-APAP to hydrocodone-APAP; patient did not tolerate; please fill hydrocodone early x 1    Please revisit this script. Pt states the pharmacy keeps insisting they can not refill as need a new script, pt states been a couple days and she has been out of med, hurting a lot and can not seem to make any headway with pharmacy. Asking Tawanna Sat to resend the script.   [facility] DR[facility] STORE [place], [place] AT [facility] [address] [facility] Phone:  [phone]  Fax:  [phone]

## 2020-10-19 NOTE — Telephone Encounter (Signed)
Hydrocodone-APAP sent in. Per PDMP last fill was 09/18/20

## 2020-10-19 NOTE — Telephone Encounter (Signed)
Patient advised.

## 2020-10-27 DIAGNOSIS — J449 Chronic obstructive pulmonary disease, unspecified: Secondary | ICD-10-CM | POA: Diagnosis not present

## 2020-11-08 ENCOUNTER — Other Ambulatory Visit: Payer: Self-pay | Admitting: Physician Assistant

## 2020-11-08 DIAGNOSIS — F411 Generalized anxiety disorder: Secondary | ICD-10-CM

## 2020-11-08 MED ORDER — ALPRAZOLAM 0.5 MG PO TABS: 0.5000 mg | ORAL_TABLET | Freq: Two times a day (BID) | ORAL | 2 refills | Status: DC | PRN

## 2020-11-08 NOTE — Telephone Encounter (Signed)
Requested medication (s) are due for refill today - no- should have RF on file  Requested medication (s) are on the active medication list - yes  Future visit scheduled -no  Last refill: 10/09/20#60 5 RF  Notes to clinic: Request for RF- non delegated Rx-should have RF on file  Requested Prescriptions  Pending Prescriptions Disp Refills   ALPRAZolam (XANAX) 0.5 MG tablet 60 tablet 5    Sig: Take 1 tablet (0.5 mg total) by mouth 2 (two) times daily as needed for anxiety.      Not Delegated - Psychiatry:  Anxiolytics/Hypnotics Failed - 11/08/2020 12:05 PM      Failed - This refill cannot be delegated      Failed - Urine Drug Screen completed in last 360 days      Passed - Valid encounter within last 6 months    Recent Outpatient Visits           4 months ago Primary hypertension   [place], Clearnce Sorrel, PA-C   6 months ago Need for influenza vaccination   [facility] Fenton Malling [place], [place]   7 months ago Mixed simple and mucopurulent chronic bronchitis [facility])   [facility] [place], [place], [place]   7 months ago Essential hypertension   [facility] Fenton Malling M, [place]   8 months ago Essential hypertension   [facility] Fenton Malling M, [place]                    Requested Prescriptions  Pending Prescriptions Disp Refills   ALPRAZolam (XANAX) 0.5 MG tablet 60 tablet 5    Sig: Take 1 tablet (0.5 mg total) by mouth 2 (two) times daily as needed for anxiety.      Not Delegated - Psychiatry:  Anxiolytics/Hypnotics Failed - 11/08/2020 12:05 PM      Failed - This refill cannot be delegated      Failed - Urine Drug Screen completed in last 360 days      Passed - Valid encounter within last 6 months    Recent Outpatient Visits           4 months ago Primary hypertension   [place], Clearnce Sorrel, PA-C   6 months ago Need for influenza  vaccination   [place], [place]   7 months ago Mixed simple and mucopurulent chronic bronchitis [facility])   [place], [place], [place]   7 months ago Essential hypertension   [place], Clearnce Sorrel, [place]   8 months ago Essential hypertension   [facility] [place], [place], [place]

## 2020-11-08 NOTE — Telephone Encounter (Signed)
Medication Refill - Medication: ALPRAZolam (XANAX) 0.5 MG tablet     Preferred Pharmacy (with phone number or street name): [id number] [place], [place] Phone:  [phone]  Fax:  [phone]       Agent: Please be advised that RX refills may take up to 3 business days. We ask that you follow-up with your pharmacy.

## 2020-11-11 ENCOUNTER — Other Ambulatory Visit: Payer: Self-pay | Admitting: Physician Assistant

## 2020-11-11 DIAGNOSIS — I10 Essential (primary) hypertension: Secondary | ICD-10-CM

## 2020-11-11 NOTE — Telephone Encounter (Signed)
Requested Prescriptions  Pending Prescriptions Disp Refills  . lisinopril (ZESTRIL) 40 MG tablet [Pharmacy Med Name: LISINOPRIL 40MG  TABLETS] 90 tablet 0    Sig: TAKE 1 TABLET(40 MG) BY MOUTH DAILY     Cardiovascular:  ACE Inhibitors Failed - 11/11/2020  9:16 AM      Failed - Cr in normal range and within 180 days    Creat  Date Value Ref Range Status  05/20/2017 0.92 0.60 - 0.93 mg/dL Final    Comment:    For patients >74 years of age, the reference limit for Creatinine is approximately 13% higher for people identified as African-American. .    Creatinine, Ser  Date Value Ref Range Status  03/24/2020 1.38 (H) 0.57 - 1.00 mg/dL Final         Failed - K in normal range and within 180 days    Potassium  Date Value Ref Range Status  03/24/2020 4.7 3.5 - 5.2 mmol/L Final  11/04/2013 4.0 3.5 - 5.1 mmol/L Final         Failed - Last BP in normal range    BP Readings from Last 1 Encounters:  07/07/20 (!) 170/81         Passed - Patient is not pregnant      Passed - Valid encounter within last 6 months    Recent Outpatient Visits          4 months ago Primary hypertension   [place], [place], PA-C   6 months ago Need for influenza vaccination   [facility] Fenton Malling M, [place]   7 months ago Mixed simple and mucopurulent chronic bronchitis [facility])   [place], [place], [place]   7 months ago Essential hypertension   [place], [place], [place]   8 months ago Essential hypertension   [place], [place], [place]

## 2020-11-17 ENCOUNTER — Other Ambulatory Visit: Payer: Self-pay | Admitting: Physician Assistant

## 2020-11-17 DIAGNOSIS — I10 Essential (primary) hypertension: Secondary | ICD-10-CM

## 2020-11-17 DIAGNOSIS — E7800 Pure hypercholesterolemia, unspecified: Secondary | ICD-10-CM

## 2020-11-17 DIAGNOSIS — S22000A Wedge compression fracture of unspecified thoracic vertebra, initial encounter for closed fracture: Secondary | ICD-10-CM

## 2020-11-17 MED ORDER — HYDROCODONE-ACETAMINOPHEN 5-325 MG PO TABS: 1.0000 | ORAL_TABLET | Freq: Four times a day (QID) | ORAL | 0 refills | Status: DC | PRN

## 2020-11-17 NOTE — Telephone Encounter (Signed)
Medication Refill - Medication:  lisinopril (ZESTRIL) 40 MG tablet ,  HYDROcodone-acetaminophen (NORCO/VICODIN) 5-325 MG tablet,   metoprolol succinate (TOPROL-XL) 25 MG 24 hr tablet ,  simvastatin (ZOCOR) 10 MG tablet   Has the patient contacted their pharmacy? Yes.    (Agent: If yes, when and what did the pharmacy advise?) Contact PCP office   Preferred Pharmacy (with phone number or street name):   [facility] [place], [place] AT [address] Phone:  [phone]  Fax:  [phone]       Agent: Please be advised that RX refills may take up to 3 business days. We ask that you follow-up with your pharmacy.

## 2020-11-17 NOTE — Telephone Encounter (Signed)
Please review request for Hydrocodone, last filled 10/19/20. KW

## 2020-11-17 NOTE — Telephone Encounter (Signed)
Requested medication (s) are due for refill today - yes  Requested medication (s) are on the active medication list -yes  Future visit scheduled -no  Last refill: 10/19/20 #120  Notes to clinic:Request RF- non delegated Rx  Requested Prescriptions  Pending Prescriptions Disp Refills   HYDROcodone-acetaminophen (NORCO/VICODIN) 5-325 MG tablet 120 tablet 0    Sig: Take 1 tablet by mouth every 6 (six) hours as needed for moderate pain.      Not Delegated - Analgesics:  Opioid Agonist Combinations Failed - 11/17/2020 10:43 AM      Failed - This refill cannot be delegated      Failed - Urine Drug Screen completed in last 360 days      Passed - Valid encounter within last 6 months    Recent Outpatient Visits           4 months ago Primary hypertension   [facility] Joycelyn Man M, PA-C   6 months ago Need for influenza vaccination   [facility] Joycelyn Man M, [place]   7 months ago Mixed simple and mucopurulent chronic bronchitis [facility])   [facility] [place], [place], [place]   7 months ago Essential hypertension   [facility] Joycelyn Man M, [place]   9 months ago Essential hypertension   [facility] [place], [place], [place]                 Refused Prescriptions Disp Refills   simvastatin (ZOCOR) 10 MG tablet 90 tablet 0    Sig: TAKE 1 TABLET(10 MG) BY MOUTH DAILY      Cardiovascular:  Antilipid - Statins Failed - 11/17/2020 10:43 AM      Failed - Total Cholesterol in normal range and within 360 days    Cholesterol, Total  Date Value Ref Range Status  09/26/2017 192 100 - 199 mg/dL Final          Failed - LDL in normal range and within 360 days    LDL Calculated  Date Value Ref Range Status  09/26/2017 106 (H) 0 - 99 mg/dL Final          Failed - HDL in normal range and within 360 days    HDL  Date Value Ref Range Status  09/26/2017 53 >39 mg/dL Final           Failed - Triglycerides in normal range and within 360 days    Triglycerides  Date Value Ref Range Status  09/26/2017 164 (H) 0 - 149 mg/dL Final          Passed - Patient is not pregnant      Passed - Valid encounter within last 12 months    Recent Outpatient Visits           4 months ago Primary hypertension   [facility] Joycelyn Man M, PA-C   6 months ago Need for influenza vaccination   [facility] Joycelyn Man M, [place]   7 months ago Mixed simple and mucopurulent chronic bronchitis [facility])   [facility] [place], [place], [place]   7 months ago Essential hypertension   [facility] Joycelyn Man M, [place]   9 months ago Essential hypertension   [facility] Joycelyn Man M, [place]                  metoprolol succinate (TOPROL-XL) 25 MG 24 hr tablet 90 tablet 3    Sig: Take  1 tablet (25 mg total) by mouth daily.      Cardiovascular:  Beta Blockers Failed - 11/17/2020 10:43 AM      Failed - Last BP in normal range    BP Readings from Last 1 Encounters:  07/07/20 (!) 170/81          Passed - Last Heart Rate in normal range    Pulse Readings from Last 1 Encounters:  07/07/20 60          Passed - Valid encounter within last 6 months    Recent Outpatient Visits           4 months ago Primary hypertension   [facility] [place], [place], PA-C   6 months ago Need for influenza vaccination   [facility] Joycelyn Man M, [place]   7 months ago Mixed simple and mucopurulent chronic bronchitis [facility])   [facility] [place], [place], [place]   7 months ago Essential hypertension   [facility] Joycelyn Man M, [place]   9 months ago Essential hypertension   [facility] Joycelyn Man M, PA-C                  lisinopril (ZESTRIL) 40 MG tablet 90 tablet 0      Cardiovascular:  ACE  Inhibitors Failed - 11/17/2020 10:43 AM      Failed - Cr in normal range and within 180 days    Creat  Date Value Ref Range Status  05/20/2017 0.92 0.60 - 0.93 mg/dL Final    Comment:    For patients >31 years of age, the reference limit for Creatinine is approximately 13% higher for people identified as African-American. .    Creatinine, Ser  Date Value Ref Range Status  03/24/2020 1.38 (H) 0.57 - 1.00 mg/dL Final          Failed - K in normal range and within 180 days    Potassium  Date Value Ref Range Status  03/24/2020 4.7 3.5 - 5.2 mmol/L Final  11/04/2013 4.0 3.5 - 5.1 mmol/L Final          Failed - Last BP in normal range    BP Readings from Last 1 Encounters:  07/07/20 (!) 170/81          Passed - Patient is not pregnant      Passed - Valid encounter within last 6 months    Recent Outpatient Visits           4 months ago Primary hypertension   [facility] [place], [place], PA-C   6 months ago Need for influenza vaccination   [facility] Joycelyn Man M, [place]   7 months ago Mixed simple and mucopurulent chronic bronchitis [facility])   [facility] [place], [place], [place]   7 months ago Essential hypertension   [facility] Joycelyn Man M, [place]   9 months ago Essential hypertension   [facility] Joycelyn Man M, [place]                    Requested Prescriptions  Pending Prescriptions Disp Refills   HYDROcodone-acetaminophen (NORCO/VICODIN) 5-325 MG tablet 120 tablet 0    Sig: Take 1 tablet by mouth every 6 (six) hours as needed for moderate pain.      Not Delegated - Analgesics:  Opioid Agonist Combinations Failed - 11/17/2020 10:43 AM      Failed - This refill cannot  be delegated      Failed - Urine Drug Screen completed in last 360 days      Passed - Valid encounter within last 6 months    Recent Outpatient Visits           4 months ago Primary  hypertension   [facility] Fenton Malling M, [place]   6 months ago Need for influenza vaccination   [facility] Fenton Malling M, [place]   7 months ago Mixed simple and mucopurulent chronic bronchitis [facility])   [facility] [place], [place], [place]   7 months ago Essential hypertension   [facility] Fenton Malling M, [place]   9 months ago Essential hypertension   [place], [place], [place]                 Refused Prescriptions Disp Refills   simvastatin (ZOCOR) 10 MG tablet 90 tablet 0    Sig: TAKE 1 TABLET(10 MG) BY MOUTH DAILY      Cardiovascular:  Antilipid - Statins Failed - 11/17/2020 10:43 AM      Failed - Total Cholesterol in normal range and within 360 days    Cholesterol, Total  Date Value Ref Range Status  09/26/2017 192 100 - 199 mg/dL Final          Failed - LDL in normal range and within 360 days    LDL Calculated  Date Value Ref Range Status  09/26/2017 106 (H) 0 - 99 mg/dL Final          Failed - HDL in normal range and within 360 days    HDL  Date Value Ref Range Status  09/26/2017 53 >39 mg/dL Final          Failed - Triglycerides in normal range and within 360 days    Triglycerides  Date Value Ref Range Status  09/26/2017 164 (H) 0 - 149 mg/dL Final          Passed - Patient is not pregnant      Passed - Valid encounter within last 12 months    Recent Outpatient Visits           4 months ago Primary hypertension   [facility] Fenton Malling M, PA-C   6 months ago Need for influenza vaccination   [facility] Fenton Malling M, [place]   7 months ago Mixed simple and mucopurulent chronic bronchitis [facility])   [place], [place], [place]   7 months ago Essential hypertension   [facility] Fenton Malling M, [place]   9 months ago Essential hypertension   [facility] Fenton Malling M, [place]                  metoprolol succinate (TOPROL-XL) 25 MG 24 hr tablet 90 tablet 3    Sig: Take 1 tablet (25 mg total) by mouth daily.      Cardiovascular:  Beta Blockers Failed - 11/17/2020 10:43 AM      Failed - Last BP in normal range    BP Readings from Last 1 Encounters:  07/07/20 (!) 170/81          Passed - Last Heart Rate in normal range    Pulse Readings from Last 1 Encounters:  07/07/20 60          Passed - Valid encounter within last 6 months    Recent Outpatient Visits  4 months ago Primary hypertension   [facility] Fenton Malling M, [place]   6 months ago Need for influenza vaccination   [place], [place]   7 months ago Mixed simple and mucopurulent chronic bronchitis [facility])   [place], [place], [place]   7 months ago Essential hypertension   [facility] Fenton Malling M, [place]   9 months ago Essential hypertension   [facility] Fenton Malling M, [place]                  lisinopril (ZESTRIL) 40 MG tablet 90 tablet 0      Cardiovascular:  ACE Inhibitors Failed - 11/17/2020 10:43 AM      Failed - Cr in normal range and within 180 days    Creat  Date Value Ref Range Status  05/20/2017 0.92 0.60 - 0.93 mg/dL Final    Comment:    For patients >20 years of age, the reference limit for Creatinine is approximately 13% higher for people identified as African-American. .    Creatinine, Ser  Date Value Ref Range Status  03/24/2020 1.38 (H) 0.57 - 1.00 mg/dL Final          Failed - K in normal range and within 180 days    Potassium  Date Value Ref Range Status  03/24/2020 4.7 3.5 - 5.2 mmol/L Final  11/04/2013 4.0 3.5 - 5.1 mmol/L Final          Failed - Last BP in normal range    BP Readings from Last 1 Encounters:  07/07/20 (!) 170/81          Passed - Patient is not pregnant       Passed - Valid encounter within last 6 months    Recent Outpatient Visits           4 months ago Primary hypertension   [place], [place], PA-C   6 months ago Need for influenza vaccination   [facility] Fenton Malling M, [place]   7 months ago Mixed simple and mucopurulent chronic bronchitis [facility])   [place], [place], [place]   7 months ago Essential hypertension   [facility] Fenton Malling M, [place]   9 months ago Essential hypertension   [place], [place], [place]

## 2020-11-17 NOTE — Telephone Encounter (Signed)
Patient is requesting Rf on medications too early. Declined RF and non delegated Rx sent for review

## 2020-11-26 DIAGNOSIS — J449 Chronic obstructive pulmonary disease, unspecified: Secondary | ICD-10-CM | POA: Diagnosis not present

## 2020-12-08 ENCOUNTER — Encounter: Payer: Self-pay | Admitting: Family Medicine

## 2020-12-08 ENCOUNTER — Other Ambulatory Visit: Payer: Self-pay

## 2020-12-08 ENCOUNTER — Ambulatory Visit
Admission: RE | Admit: 2020-12-08 | Discharge: 2020-12-08 | Disposition: A | Discharge status: Home / Self Care | Payer: Medicare Other | Attending: Family Medicine | Admitting: Family Medicine

## 2020-12-08 ENCOUNTER — Ambulatory Visit (INDEPENDENT_AMBULATORY_CARE_PROVIDER_SITE_OTHER): Payer: Medicare Other | Admitting: Family Medicine

## 2020-12-08 ENCOUNTER — Ambulatory Visit
Admission: RE | Admit: 2020-12-08 | Discharge: 2020-12-08 | Disposition: A | Discharge status: Home / Self Care | Payer: Medicare Other | Source: Ambulatory Visit | Attending: Family Medicine | Admitting: Family Medicine

## 2020-12-08 VITALS — BP 145/62 | HR 66 | Temp 97.5°F | Wt 89.0 lb

## 2020-12-08 DIAGNOSIS — S22080D Wedge compression fracture of T11-T12 vertebra, subsequent encounter for fracture with routine healing: Secondary | ICD-10-CM | POA: Diagnosis not present

## 2020-12-08 DIAGNOSIS — E7800 Pure hypercholesterolemia, unspecified: Secondary | ICD-10-CM

## 2020-12-08 DIAGNOSIS — F411 Generalized anxiety disorder: Secondary | ICD-10-CM

## 2020-12-08 DIAGNOSIS — M549 Dorsalgia, unspecified: Secondary | ICD-10-CM | POA: Diagnosis not present

## 2020-12-08 DIAGNOSIS — M545 Low back pain, unspecified: Secondary | ICD-10-CM | POA: Diagnosis not present

## 2020-12-08 DIAGNOSIS — Z860100 Personal history of colon polyps, unspecified: Secondary | ICD-10-CM

## 2020-12-08 DIAGNOSIS — S32010D Wedge compression fracture of first lumbar vertebra, subsequent encounter for fracture with routine healing: Secondary | ICD-10-CM

## 2020-12-08 DIAGNOSIS — I10 Essential (primary) hypertension: Secondary | ICD-10-CM | POA: Diagnosis not present

## 2020-12-08 DIAGNOSIS — M8000XA Age-related osteoporosis with current pathological fracture, unspecified site, initial encounter for fracture: Secondary | ICD-10-CM

## 2020-12-08 NOTE — Progress Notes (Signed)
Established patient visit   Patient: [name] [name]   DOB: August 29, 1945   75 y.o. Female  MRN: [id number] Visit Date: 12/08/2020  Today's healthcare provider: Vernie Murders, PA-C   Chief Complaint  Patient presents with   Hypertension   Back Pain   Subjective    HPI  Hypertension, follow-up  BP Readings from Last 3 Encounters:  12/08/20 (!) 145/62  07/07/20 (!) 170/81  05/05/20 (!) 173/73   Wt Readings from Last 3 Encounters:  12/08/20 89 lb (40.4 kg)  07/07/20 87 lb 3.2 oz (39.6 kg)  05/23/20 88 lb (39.9 kg)     She was last seen for hypertension 6 months ago.    She reports excellent compliance with treatment. She is not having side effects.  She is following a Regular diet. She is not exercising. She does smoke.  Use of agents associated with hypertension: none.   Outside blood pressures are 150's/80's. Symptoms: No chest pain No chest pressure  No palpitations No syncope  No dyspnea No orthopnea  No paroxysmal nocturnal dyspnea No lower extremity edema   Pertinent labs: Lab Results  Component Value Date   CHOL 192 09/26/2017   HDL 53 09/26/2017   LDLCALC 106 (H) 09/26/2017   TRIG 164 (H) 09/26/2017   CHOLHDL 3.6 09/26/2017   Lab Results  Component Value Date   NA 130 (L) 03/24/2020   K 4.7 03/24/2020   CREATININE 1.38 (H) 03/24/2020   GFRNONAA 38 (L) 03/24/2020   GFRAA 44 (L) 03/24/2020   GLUCOSE 94 03/24/2020     The ASCVD Risk score Mikey Bussing DC Jr., et al., 2013) failed to calculate for the following reasons:   Cannot find a previous HDL lab   Cannot find a previous total cholesterol lab   ---------------------------------------------------------------------------------------------------  Follow up for COPD  The patient was last seen for this 6 months ago.  She reports excellent compliance with treatment. She feels that condition is Unchanged. She is not having side effects.    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) 09/26/2017   Alcohol abuse 08/28/2016   Pedal edema 03/13/2016   Osteoporosis 09/07/2015   Pleural effusion 02/03/2015   Rib fracture 02/02/2015   Gastric ulcer 12/29/2014   H/O transient cerebral ischemia 12/29/2014   HLD (hyperlipidemia) 12/29/2014   BP (high blood pressure) 12/29/2014   Below normal amount of sodium in the blood 12/29/2014   Awareness of heartbeats 12/29/2014   Abnormal Pap smear of vagina 12/29/2014   Plantar fasciitis 12/29/2014   Esophageal reflux 12/29/2014   MI (mitral incompetence) 12/31/2013   TI (tricuspid incompetence) 12/31/2013   Chest pain 12/31/2013   Chronic obstructive pulmonary disease ([place]) 12/31/2013   Abnormal blood sugar 03/01/2009   GAD (generalized anxiety disorder) 02/15/2009   Current tobacco use 06/26/2006   Past Medical History:  Diagnosis Date   Anxiety    CAD (coronary artery disease) unk   COPD (chronic obstructive pulmonary disease) (  [place])    Depression    Hypercholesteremia unk   Hypertension    Past Surgical History:  Procedure Laterality Date   ABDOMINAL HYSTERECTOMY  1982   BACK SURGERY     COLONOSCOPY WITH PROPOFOL N/A 04/07/2018   Procedure: COLONOSCOPY WITH PROPOFOL;  Surgeon: [name], MD;  Location: [facility] ENDOSCOPY;  Service: Endoscopy;  Laterality: N/A;   KYPHOPLASTY  N/A 10/23/2017   Procedure: [id number];  Surgeon: Hessie Knows, MD;  Location: [facility] ORS;  Service: Orthopedics;  Laterality: N/A;   Family History  Problem Relation Age of Onset   Leukemia Mother    Aneurysm Father    Diabetes Father    Heart disease Father    Diabetes Maternal Grandmother    Diabetes Paternal Grandmother     Social History   Tobacco Use   Smoking status: Current Every Day Smoker    Packs/day: 1.00    Years: 53.00    Pack years: 53.00    Types: Cigarettes   Smokeless tobacco: Never Used  Scientific laboratory technician Use: Never used  Substance Use Topics   Alcohol use: Yes    Alcohol/week: 2.0 standard drinks    Types: 2 Glasses of wine per week   Drug use: Not Currently    Types: Marijuana    Comment: in the past   No Known Allergies   Medications: Outpatient Medications Prior to Visit  Medication Sig   albuterol (VENTOLIN HFA) 108 (90 Base) MCG/ACT inhaler Inhale 2 puffs into the lungs every 4 (four) hours as needed for wheezing or shortness of breath.   ALPRAZolam (XANAX) 0.5 MG tablet Take 1 tablet (0.5 mg total) by mouth 2 (two) times daily as needed for anxiety.   amLODipine (NORVASC) 5 MG tablet Take 1 tablet (5 mg total) by mouth daily.   aspirin 81 MG tablet Take 81 mg by mouth daily.   Calcium 600-200 MG-UNIT tablet Take 1 tablet by mouth daily.   cholecalciferol (VITAMIN D) 25 MCG (1000 UNIT) tablet Take 1,000 Units by mouth daily.   citalopram (CELEXA) 40 MG tablet Take 1 tablet (40 mg total) by mouth daily.   Docusate Sodium (COLACE PO) Take 100 mg by mouth daily as needed (constipation).    Fluticasone-Umeclidin-Vilant (TRELEGY ELLIPTA) 100-62.5-25 MCG/INH AEPB Inhale 1 puff into the lungs daily.   furosemide (LASIX) 20 MG tablet Take 20 mg by mouth daily as needed for fluid.    HYDROcodone-acetaminophen (NORCO/VICODIN) 5-325 MG tablet Take 1 tablet by mouth every 6 (six) hours as needed for moderate pain.   ibuprofen (ADVIL,MOTRIN) 600 MG  tablet Take 600 mg by mouth every 8 (eight) hours as needed.   lisinopril (ZESTRIL) 40 MG tablet TAKE 1 TABLET(40 MG) BY MOUTH DAILY   metoprolol succinate (TOPROL-XL) 25 MG 24 hr tablet Take 1 tablet (25 mg total) by mouth daily.   mometasone-formoterol (DULERA) 200-5 MCG/ACT AERO Inhale 2 puffs into the lungs 2 (two) times daily.   pantoprazole (PROTONIX) 20 MG tablet TAKE 1 TABLET(20 MG) BY MOUTH DAILY   potassium chloride SA (K-DUR,KLOR-CON) 20 MEQ tablet Take 1 tablet (20 mEq total) by mouth daily.   simvastatin (ZOCOR) 10 MG tablet TAKE 1 TABLET(10 MG) BY MOUTH DAILY   Tiotropium Bromide Monohydrate (SPIRIVA RESPIMAT) 1.25 MCG/ACT AERS Inhale 1 puff into the lungs daily.   No facility-administered medications prior to visit.    Review of Systems  Constitutional: Negative.   Respiratory: Positive for cough and shortness of breath. Negative for apnea, choking,  chest tightness, wheezing and stridor.   Cardiovascular: Negative.   Gastrointestinal: Positive for constipation and diarrhea. Negative for abdominal distention, abdominal pain, anal bleeding, blood in stool, nausea, rectal pain and vomiting.  Musculoskeletal: Positive for back pain. Negative for arthralgias, gait problem, joint swelling, myalgias, neck pain and neck stiffness.    Objective    BP (!) 145/62 (BP Location: Right Arm, Patient Position: Sitting, Cuff Size: Normal)   Pulse 66   Temp (!) 97.5 F (36.4 C) (Oral)   Wt 89 lb (40.4 kg)   SpO2 100%   BMI 16.28 kg/m   Physical Exam Constitutional:      General: She is not in acute distress.    Appearance: She is well-developed.  HENT:     Head: Normocephalic and atraumatic.     Right Ear: Hearing and tympanic membrane normal.     Left Ear: Hearing and tympanic membrane normal.     Nose: Nose normal.  Eyes:     General: Lids are normal. No scleral icterus.       Right eye: No discharge.        Left eye: No discharge.     Conjunctiva/sclera: Conjunctivae  normal.  Cardiovascular:     Rate and Rhythm: Normal rate and regular rhythm.     Pulses: Normal pulses.     Heart sounds: Normal heart sounds.  Pulmonary:     Effort: Pulmonary effort is normal. No respiratory distress.     Comments: Slightly distant breath sounds without wheezes or rales. History of COPD controlled by inhalers. Musculoskeletal:        General: Normal range of motion.  Skin:    Findings: No lesion or rash.  Neurological:     Mental Status: She is alert and oriented to person, place, and time.  Psychiatric:        Speech: Speech normal.        Behavior: Behavior normal.        Thought Content: Thought content normal.      No results found for any visits on 12/08/20.  Assessment & Plan     1. Primary hypertension Well controlled BP on the Lisinopril 40 mg qd, Metoprolol Succinate 25 mg qd, Amlodipine 5 mg qd and Furosemide 20 mg prn edema (takes KCL 20 meq qd when she takes Lasix). Recheck labs and follow up pending reports. - CBC with Differential/Platelet - Comprehensive metabolic panel  2. Pure hypercholesterolemia Tolerating Simvastatin 10 mg qd. Will recheck labs and continue present medication with low fat diet. Encouraged extra water intake and low fat diet. - Comprehensive metabolic panel - Lipid panel - TSH  3. GAD (generalized anxiety disorder) Feels stable on Celexa 40 mg qd and prn use of Xanax 0.5 mg BID for flares. Recheck labs. - CBC with Differential/Platelet - Comprehensive metabolic panel - TSH  4. Personal history of colonic polyps History of multiple colon polyps on colonoscopy by Dr. Allen Norris (GI) in 2019 and expect to hear from him any day now about repeat screening. Pathology report on 04-07-18 showed tubular adenomas and one hyperplastic polyp. No recheck melena or hematochezia.  5. Closed compression fracture of thoracolumbar vertebra with routine healing, subsequent encounter History of compression of L1 vertebral body and mild  endplate compression of T6 on CT chest on 05-23-20. Continues to have chronic back pain.with use of Ibuprofen and Norco 5-325 mg qid prn moderately severe pain. Recheck labs and may need orthopedic evaluation. - DG Thoracic Spine W/Swimmers - DG  Lumbar Spine Complete - CBC with Differential/Platelet - Comprehensive metabolic panel - VITAMIN D 25 Hydroxy (Vit-D Deficiency, Fractures) - TSH  6. Age-related osteoporosis with current pathological fracture, initial encounter BMD positive for osteoporosis in 2019. Has been treated with Alendronate in the past. Vitamin D low with normal calcium. Will recheck levels and should consider repeat BMD test. Continue calcium and vitamin D supplements. - CBC with Differential/Platelet - Comprehensive metabolic panel - VITAMIN D 25 Hydroxy (Vit-D Deficiency, Fractures) - TSH   No follow-ups on file.      I, Lebert Lovern, PA-C, have reviewed all documentation for this visit. The documentation on 12/08/20 for the exam, diagnosis, procedures, and orders are all accurate and complete.    Vernie Murders, PA-C  [employer] [phone] (phone) [phone] (fax)  [place]

## 2020-12-12 DIAGNOSIS — I10 Essential (primary) hypertension: Secondary | ICD-10-CM | POA: Diagnosis not present

## 2020-12-12 DIAGNOSIS — E559 Vitamin D deficiency, unspecified: Secondary | ICD-10-CM | POA: Diagnosis not present

## 2020-12-12 DIAGNOSIS — S32010D Wedge compression fracture of first lumbar vertebra, subsequent encounter for fracture with routine healing: Secondary | ICD-10-CM | POA: Diagnosis not present

## 2020-12-12 DIAGNOSIS — M8000XA Age-related osteoporosis with current pathological fracture, unspecified site, initial encounter for fracture: Secondary | ICD-10-CM | POA: Diagnosis not present

## 2020-12-12 DIAGNOSIS — S22080D Wedge compression fracture of T11-T12 vertebra, subsequent encounter for fracture with routine healing: Secondary | ICD-10-CM | POA: Diagnosis not present

## 2020-12-13 LAB — COMPREHENSIVE METABOLIC PANEL
ALT: 13 IU/L (ref 0–32)
AST: 24 IU/L (ref 0–40)
Albumin/Globulin Ratio: 1.8 (ref 1.2–2.2)
Albumin: 4.6 g/dL (ref 3.7–4.7)
Alkaline Phosphatase: 110 IU/L (ref 44–121)
BUN/Creatinine Ratio: 13 (ref 12–28)
BUN: 14 mg/dL (ref 8–27)
Bilirubin Total: 0.3 mg/dL (ref 0.0–1.2)
CO2: 20 mmol/L (ref 20–29)
Calcium: 9.4 mg/dL (ref 8.7–10.3)
Chloride: 101 mmol/L (ref 96–106)
Creatinine, Ser: 1.05 mg/dL — ABNORMAL HIGH (ref 0.57–1.00)
Globulin, Total: 2.6 g/dL (ref 1.5–4.5)
Glucose: 102 mg/dL — ABNORMAL HIGH (ref 65–99)
Potassium: 4.4 mmol/L (ref 3.5–5.2)
Sodium: 136 mmol/L (ref 134–144)
Total Protein: 7.2 g/dL (ref 6.0–8.5)
eGFR: 56 mL/min/{1.73_m2} — ABNORMAL LOW (ref >59)

## 2020-12-13 LAB — LIPID PANEL
Chol/HDL Ratio: 2.3 ratio (ref 0.0–4.4)
Cholesterol, Total: 142 mg/dL (ref 100–199)
HDL: 61 mg/dL (ref >39)
LDL Chol Calc (NIH): 62 mg/dL (ref 0–99)
Triglycerides: 103 mg/dL (ref 0–149)
VLDL Cholesterol Cal: 19 mg/dL (ref 5–40)

## 2020-12-13 LAB — CBC WITH DIFFERENTIAL/PLATELET
Basophils Absolute: 0.1 10*3/uL (ref 0.0–0.2)
Basos: 1 %
EOS (ABSOLUTE): 0.2 10*3/uL (ref 0.0–0.4)
Eos: 4 %
Hematocrit: 37.3 % (ref 34.0–46.6)
Hemoglobin: 12.4 g/dL (ref 11.1–15.9)
Immature Grans (Abs): 0 10*3/uL (ref 0.0–0.1)
Immature Granulocytes: 0 %
Lymphocytes Absolute: 2.3 10*3/uL (ref 0.7–3.1)
Lymphs: 36 %
MCH: 32.5 pg (ref 26.6–33.0)
MCHC: 33.2 g/dL (ref 31.5–35.7)
MCV: 98 fL — ABNORMAL HIGH (ref 79–97)
Monocytes Absolute: 0.5 10*3/uL (ref 0.1–0.9)
Monocytes: 8 %
Neutrophils Absolute: 3.1 10*3/uL (ref 1.4–7.0)
Neutrophils: 51 %
Platelets: 225 10*3/uL (ref 150–450)
RBC: 3.81 x10E6/uL (ref 3.77–5.28)
RDW: 12.5 % (ref 11.7–15.4)
WBC: 6.3 10*3/uL (ref 3.4–10.8)

## 2020-12-13 LAB — VITAMIN D 25 HYDROXY (VIT D DEFICIENCY, FRACTURES): Vit D, 25-Hydroxy: 27.6 ng/mL — ABNORMAL LOW (ref 30.0–100.0)

## 2020-12-13 LAB — TSH: TSH: 1.24 u[IU]/mL (ref 0.450–4.500)

## 2020-12-15 ENCOUNTER — Other Ambulatory Visit: Payer: Self-pay | Admitting: Family Medicine

## 2020-12-15 DIAGNOSIS — F411 Generalized anxiety disorder: Secondary | ICD-10-CM

## 2020-12-15 DIAGNOSIS — S22000A Wedge compression fracture of unspecified thoracic vertebra, initial encounter for closed fracture: Secondary | ICD-10-CM

## 2020-12-15 NOTE — Telephone Encounter (Signed)
Requested medication (s) are due for refill today: Yes  Requested medication (s) are on the active medication list: Yes  Last refill:  11/08/20  Future visit scheduled: Yes  Notes to clinic:  Unable to refill per protocol, cannot delgate.      Requested Prescriptions  Pending Prescriptions Disp Refills   ALPRAZolam (XANAX) 0.5 MG tablet 60 tablet 2    Sig: Take 1 tablet (0.5 mg total) by mouth 2 (two) times daily as needed for anxiety.      Not Delegated - Psychiatry:  Anxiolytics/Hypnotics Failed - 12/15/2020  1:34 PM      Failed - This refill cannot be delegated      Failed - Urine Drug Screen completed in last 360 days      Passed - Valid encounter within last 6 months    Recent Outpatient Visits           1 week ago Primary hypertension   [place], Vickki Muff, PA-C   5 months ago Primary hypertension   [employer], Clearnce Sorrel, [place]   7 months ago Need for influenza vaccination   [place], [place]   8 months ago Mixed simple and mucopurulent chronic bronchitis [facility])   [place], [place], [place]   8 months ago Essential hypertension   [facility] [place], [place], [place]

## 2020-12-15 NOTE — Telephone Encounter (Signed)
Medication Refill - Medication: Alprazolam   Has the patient contacted their pharmacy? Yes.   Pt states that she was advised pharmacy to contact PCP to have another order placed for this medication. Please advise.  (Agent: If no, request that the patient contact the pharmacy for the refill.) (Agent: If yes, when and what did the pharmacy advise?)  Preferred Pharmacy (with phone number or street name):  [facility] [place], [address] [place]  [place] [address]  Phone: [phone] Fax: [phone]  Hours: Not open 24 hours    Agent: Please be advised that RX refills may take up to 3 business days. We ask that you follow-up with your pharmacy.

## 2020-12-15 NOTE — Telephone Encounter (Signed)
Requested medication (s) are due for refill today: Yes  Requested medication (s) are on the active medication list: Yes  Last refill:  11/17/20  Future visit scheduled: No  Notes to clinic:  Unable to refill per protocol, cannot delgate.      Requested Prescriptions  Pending Prescriptions Disp Refills   HYDROcodone-acetaminophen (NORCO/VICODIN) 5-325 MG tablet 120 tablet 0    Sig: Take 1 tablet by mouth every 6 (six) hours as needed for moderate pain.      Not Delegated - Analgesics:  Opioid Agonist Combinations Failed - 12/15/2020  3:21 PM      Failed - This refill cannot be delegated      Failed - Urine Drug Screen completed in last 360 days      Passed - Valid encounter within last 6 months    Recent Outpatient Visits           1 week ago Primary hypertension   [place], Vickki Muff, PA-C   5 months ago Primary hypertension   [place], [place], [place]   7 months ago Need for influenza vaccination   [place], [place]   8 months ago Mixed simple and mucopurulent chronic bronchitis [facility])   [place], [place], [place]   8 months ago Essential hypertension   [facility] [place], [place], [place]

## 2020-12-15 NOTE — Telephone Encounter (Signed)
Medication Refill - Medication: Hydrocodone 5/325  Has the patient contacted their pharmacy? No. (Agent: If no, request that the patient contact the pharmacy for the refill.) (Agent: If yes, when and what did the pharmacy advise?)  Preferred Pharmacy (with phone number or street name): [facility] [address]  Agent: Please be advised that RX refills may take up to 3 business days. We ask that you follow-up with your pharmacy.

## 2020-12-18 MED ORDER — HYDROCODONE-ACETAMINOPHEN 5-325 MG PO TABS: 1.0000 | ORAL_TABLET | Freq: Four times a day (QID) | ORAL | 0 refills | Status: DC | PRN

## 2020-12-27 ENCOUNTER — Other Ambulatory Visit: Payer: Self-pay | Admitting: Family Medicine

## 2020-12-27 DIAGNOSIS — S22080D Wedge compression fracture of T11-T12 vertebra, subsequent encounter for fracture with routine healing: Secondary | ICD-10-CM

## 2020-12-27 DIAGNOSIS — J449 Chronic obstructive pulmonary disease, unspecified: Secondary | ICD-10-CM | POA: Diagnosis not present

## 2020-12-27 DIAGNOSIS — M8000XA Age-related osteoporosis with current pathological fracture, unspecified site, initial encounter for fracture: Secondary | ICD-10-CM

## 2020-12-27 NOTE — Progress Notes (Signed)
Placed order for orthopedic spine specialist. Forgot to put in she wanted [facility] orthopedist.

## 2021-01-08 ENCOUNTER — Other Ambulatory Visit: Payer: Self-pay | Admitting: Family Medicine

## 2021-01-08 DIAGNOSIS — F411 Generalized anxiety disorder: Secondary | ICD-10-CM

## 2021-01-08 MED ORDER — ALPRAZOLAM 0.5 MG PO TABS: 0.5000 mg | ORAL_TABLET | Freq: Two times a day (BID) | ORAL | 0 refills | Status: DC | PRN

## 2021-01-08 NOTE — Telephone Encounter (Signed)
Requested medication (s) are due for refill today: yes  Requested medication (s) are on the active medication list: yes  Future visit scheduled: no  Notes to clinic: this refill cannot be delegated    Requested Prescriptions  Pending Prescriptions Disp Refills   ALPRAZolam (XANAX) 0.5 MG tablet 60 tablet 2    Sig: Take 1 tablet (0.5 mg total) by mouth 2 (two) times daily as needed for anxiety.      Not Delegated - Psychiatry:  Anxiolytics/Hypnotics Failed - 01/08/2021  3:04 PM      Failed - This refill cannot be delegated      Failed - Urine Drug Screen completed in last 360 days      Passed - Valid encounter within last 6 months    Recent Outpatient Visits           1 month ago Primary hypertension   [place], Vickki Muff, PA-C   6 months ago Primary hypertension   [place], Clearnce Sorrel, PA-C   8 months ago Need for influenza vaccination   [place], [place]   9 months ago Mixed simple and mucopurulent chronic bronchitis [facility])   [facility] Malling M, [place]   9 months ago Essential hypertension   [facility] [place], [place], [place]

## 2021-01-08 NOTE — Telephone Encounter (Signed)
Medication Refill - Medication: ALPRAZolam (XANAX) 0.5 MG tablet     Preferred Pharmacy (with phone number or street name):  [id number] [address], [address] [place] Phone:  [phone]  Fax:  [phone]      Agent: Please be advised that RX refills may take up to 3 business days. We ask that you follow-up with your pharmacy.

## 2021-01-16 ENCOUNTER — Other Ambulatory Visit: Payer: Self-pay | Admitting: Family Medicine

## 2021-01-16 DIAGNOSIS — S22000A Wedge compression fracture of unspecified thoracic vertebra, initial encounter for closed fracture: Secondary | ICD-10-CM

## 2021-01-16 MED ORDER — HYDROCODONE-ACETAMINOPHEN 5-325 MG PO TABS: 1.0000 | ORAL_TABLET | Freq: Four times a day (QID) | ORAL | 0 refills | Status: DC | PRN

## 2021-01-16 NOTE — Telephone Encounter (Signed)
Medication Refill - Medication: HYDROcodone-acetaminophen (NORCO/VICODIN) 5-325 MG tablet   ( Preferred Pharmacy (with phone number or street name):  [id number] [address], [place] Phone:  [phone]  Fax:  [phone]      Agent: Please be advised that RX refills may take up to 3 business days. We ask that you follow-up with your pharmacy.

## 2021-01-16 NOTE — Telephone Encounter (Signed)
Requested medication (s) are due for refill today: yes  Requested medication (s) are on the active medication list: yes   Last refill: 12/18/2020  Future visit scheduled:  yes   Notes to clinic:  this refill cannot be delegated    Requested Prescriptions  Pending Prescriptions Disp Refills   HYDROcodone-acetaminophen (NORCO/VICODIN) 5-325 MG tablet 120 tablet 0    Sig: Take 1 tablet by mouth every 6 (six) hours as needed for moderate pain.      There is no refill protocol information for this order

## 2021-02-05 ENCOUNTER — Other Ambulatory Visit: Payer: Self-pay | Admitting: Family Medicine

## 2021-02-05 DIAGNOSIS — F411 Generalized anxiety disorder: Secondary | ICD-10-CM

## 2021-02-05 NOTE — Telephone Encounter (Signed)
Pt called in to request a refill for her ALPRAZolam (XANAX) 0.5 MG tablet   Pt says that she will be out of her medication in 2 days.   Pharmacy: [facility] [place], [place] AT [facility] [address]  [place], [place] [address]  Phone:  [phone]  Fax:  [phone]

## 2021-02-05 NOTE — Telephone Encounter (Signed)
Please review for refill. Refill not delegated per protocol.  Last refill: 01/08/21 #60 Last OV: 12/08/20

## 2021-02-06 ENCOUNTER — Telehealth: Payer: Self-pay

## 2021-02-06 MED ORDER — ALPRAZOLAM 0.5 MG PO TABS: 0.5000 mg | ORAL_TABLET | Freq: Two times a day (BID) | ORAL | 0 refills | Status: DC | PRN

## 2021-02-06 NOTE — Telephone Encounter (Signed)
Copied from [place] [id number]. Topic: General - Other >> Feb 06, 2021 11:33 AM Celene Kras wrote: Reason for CRM: Pt calling stating that Judson Roch gave her a call and that she isnt sure why. She states that she has tried to call back multiple times and never seems to be able to get her on the line. Attempted to contact office and no response. Please advise.

## 2021-02-13 ENCOUNTER — Other Ambulatory Visit: Payer: Self-pay | Admitting: Family Medicine

## 2021-02-13 DIAGNOSIS — S22000A Wedge compression fracture of unspecified thoracic vertebra, initial encounter for closed fracture: Secondary | ICD-10-CM

## 2021-02-13 NOTE — Telephone Encounter (Signed)
Requested medication (s) are due for refill today: yes  Requested medication (s) are on the active medication list: yes  Last refill:  01/16/21 #120 0 refills   Future visit scheduled: no  Notes to clinic:  not delegated per protocol     Requested Prescriptions  Pending Prescriptions Disp Refills   HYDROcodone-acetaminophen (NORCO/VICODIN) 5-325 MG tablet 120 tablet 0    Sig: Take 1 tablet by mouth every 6 (six) hours as needed for moderate pain.      Not Delegated - Analgesics:  Opioid Agonist Combinations Failed - 02/13/2021 12:19 PM      Failed - This refill cannot be delegated      Failed - Urine Drug Screen completed in last 360 days      Passed - Valid encounter within last 6 months    Recent Outpatient Visits           2 months ago Primary hypertension   [place], Vickki Muff, PA-C   7 months ago Primary hypertension   [facility] Fenton Malling M, [place]   9 months ago Need for influenza vaccination   [place], [place]   10 months ago Mixed simple and mucopurulent chronic bronchitis [facility])   [place], [place], [place]   10 months ago Essential hypertension   [facility] [place], [place], [place]

## 2021-02-13 NOTE — Telephone Encounter (Signed)
Medication Refill - Medication: Hydrocodone   Has the patient contacted their pharmacy? No. Pt states that she always has to call it into the office. She states that she will be out of this medication this afternoon. Please advise.  (Agent: If no, request that the patient contact the pharmacy for the refill.) (Agent: If yes, when and what did the pharmacy advise?)  Preferred Pharmacy (with phone number or street name):  [facility] [place], [address] [place]  [place] [address]  Phone: [phone] Fax: [phone]  Hours: Not open 24 hours    Agent: Please be advised that RX refills may take up to 3 business days. We ask that you follow-up with your pharmacy.

## 2021-02-14 MED ORDER — HYDROCODONE-ACETAMINOPHEN 5-325 MG PO TABS: 1.0000 | ORAL_TABLET | Freq: Four times a day (QID) | ORAL | 0 refills | Status: DC | PRN

## 2021-02-20 ENCOUNTER — Telehealth: Payer: Self-pay | Admitting: Physician Assistant

## 2021-02-20 DIAGNOSIS — I10 Essential (primary) hypertension: Secondary | ICD-10-CM

## 2021-02-20 NOTE — Telephone Encounter (Signed)
[facility] faxed refill request for the following medications:   simvastatin (ZOCOR) 10 MG tablet     Please advise.

## 2021-02-22 NOTE — Telephone Encounter (Signed)
We received another fax from [facility] requesting simvastatin (ZOCOR) 10 MG tablet.  Please advise.

## 2021-02-28 DIAGNOSIS — H2513 Age-related nuclear cataract, bilateral: Secondary | ICD-10-CM | POA: Diagnosis not present

## 2021-03-07 ENCOUNTER — Other Ambulatory Visit: Payer: Self-pay | Admitting: Family Medicine

## 2021-03-07 DIAGNOSIS — F411 Generalized anxiety disorder: Secondary | ICD-10-CM

## 2021-03-07 NOTE — Telephone Encounter (Signed)
Requested medications are due for refill today.  yes  Requested medications are on the active medications list.  yes  Last refill. 02/06/2021  Future visit scheduled.   no  Notes to clinic.  Medication not delegated.

## 2021-03-08 ENCOUNTER — Other Ambulatory Visit: Payer: Self-pay

## 2021-03-08 DIAGNOSIS — E7800 Pure hypercholesterolemia, unspecified: Secondary | ICD-10-CM

## 2021-03-08 MED ORDER — SIMVASTATIN 10 MG PO TABS: ORAL_TABLET | ORAL | 0 refills | Status: DC

## 2021-03-08 NOTE — Telephone Encounter (Signed)
Pt called back to report that they never gave her this Rx on July 12, she says that the pharmacy has asked her to contact her PCP. Pt says she is very frustrated and distressed that this has been such a hassle for her. Please advise   ALPRAZolam (XANAX) 0.5 MG tablet  & simvastatin (ZOCOR) 10 MG tablet [

## 2021-03-08 NOTE — Telephone Encounter (Signed)
  Notes to clinic:  Pt called back to report that they never gave her this Rx on July 12, she says that the pharmacy has asked her to contact her PCP. Pt says she is very frustrated and distressed that this has been such a hassle for her. Please advise    Requested Prescriptions  Pending Prescriptions Disp Refills   ALPRAZolam (XANAX) 0.5 MG tablet [Pharmacy Med Name: ALPRAZOLAM 0.'5MG'$  TABLETS] 60 tablet     Sig: TAKE 1 TABLET(0.5 MG) BY MOUTH TWICE DAILY AS NEEDED FOR ANXIETY     Not Delegated - Psychiatry:  Anxiolytics/Hypnotics Failed - 03/08/2021 11:08 AM      Failed - This refill cannot be delegated      Failed - Urine Drug Screen completed in last 360 days      Passed - Valid encounter within last 6 months    Recent Outpatient Visits           3 months ago Primary hypertension   [employer], Vickki Muff, PA-C   8 months ago Primary hypertension   [facility] Fenton Malling M, PA-C   10 months ago Need for influenza vaccination   [employer], [place]   11 months ago Mixed simple and mucopurulent chronic bronchitis [facility])   [facility] Fenton Malling M, [place]   11 months ago Essential hypertension   [facility] [place], [place], [place]

## 2021-03-09 NOTE — Telephone Encounter (Signed)
Patient has made an additional call regarding their prescriptions and would like to speak with member of clinical staff when possible  Please contact further when available

## 2021-04-11 ENCOUNTER — Other Ambulatory Visit: Payer: Self-pay | Admitting: Family Medicine

## 2021-04-11 DIAGNOSIS — S22000A Wedge compression fracture of unspecified thoracic vertebra, initial encounter for closed fracture: Secondary | ICD-10-CM

## 2021-04-11 NOTE — Telephone Encounter (Signed)
Requested medication (s) are due for refill today -yes  Requested medication (s) are on the active medication list -yes  Future visit scheduled -no  Last refill: 02/14/21 #120  Notes to clinic: Request RF: non delegated Rx- last RF has notes  Requested Prescriptions  Pending Prescriptions Disp Refills   HYDROcodone-acetaminophen (NORCO/VICODIN) 5-325 MG tablet 120 tablet 0    Sig: Take 1 tablet by mouth every 6 (six) hours as needed for moderate pain. Will need appointment for refills.     Not Delegated - Analgesics:  Opioid Agonist Combinations Failed - 04/11/2021  4:04 PM      Failed - This refill cannot be delegated      Failed - Urine Drug Screen completed in last 360 days      Passed - Valid encounter within last 6 months    Recent Outpatient Visits           4 months ago Primary hypertension   [place], Vickki Muff, PA-C   9 months ago Primary hypertension   [facility] Fenton Malling M, [place]   11 months ago Need for influenza vaccination   [facility] [place], [place], [place]   1 year ago Mixed simple and mucopurulent chronic bronchitis [facility])   [facility] [place], [place], [place]   1 year ago Essential hypertension   [facility] Fenton Malling [place], [place]                 Requested Prescriptions  Pending Prescriptions Disp Refills   HYDROcodone-acetaminophen (NORCO/VICODIN) 5-325 MG tablet 120 tablet 0    Sig: Take 1 tablet by mouth every 6 (six) hours as needed for moderate pain. Will need appointment for refills.     Not Delegated - Analgesics:  Opioid Agonist Combinations Failed - 04/11/2021  4:04 PM      Failed - This refill cannot be delegated      Failed - Urine Drug Screen completed in last 360 days      Passed - Valid encounter within last 6 months    Recent Outpatient Visits           4 months ago Primary hypertension   [place], Vickki Muff, PA-C   9 months ago Primary hypertension   [facility] Fenton Malling M, [place]   11 months ago Need for influenza vaccination   [place], [place]   1 year ago Mixed simple and mucopurulent chronic bronchitis [facility])   [facility] [place], Clearnce Sorrel, [place]   1 year ago Essential hypertension   [place], [place], [place]

## 2021-04-11 NOTE — Telephone Encounter (Signed)
Copied from [place] [id number]. Topic: Quick Communication - Rx Refill/Question >> Apr 11, 2021  3:52 PM Leward Quan A wrote: Medication: HYDROcodone-acetaminophen (NORCO/VICODIN) 5-325 MG tablet  Has the patient contacted their pharmacy? No. (Agent: If no, request that the patient contact the pharmacy for the refill.) (Agent: If yes, when and what did the pharmacy advise?)  Preferred Pharmacy (with phone number or street name): [facility] [place], [place] [address] [place]  Phone:  [phone] Fax:  [phone]     Agent: Please be advised that RX refills may take up to 3 business days. We ask that you follow-up with your pharmacy.

## 2021-04-12 MED ORDER — HYDROCODONE-ACETAMINOPHEN 5-325 MG PO TABS: 1.0000 | ORAL_TABLET | Freq: Four times a day (QID) | ORAL | 0 refills | Status: DC | PRN

## 2021-04-13 DIAGNOSIS — M545 Low back pain, unspecified: Secondary | ICD-10-CM | POA: Diagnosis not present

## 2021-04-22 ENCOUNTER — Other Ambulatory Visit: Payer: Self-pay | Admitting: Physician Assistant

## 2021-04-22 DIAGNOSIS — I10 Essential (primary) hypertension: Secondary | ICD-10-CM

## 2021-05-03 ENCOUNTER — Other Ambulatory Visit: Payer: Self-pay | Admitting: Physician Assistant

## 2021-05-03 DIAGNOSIS — I10 Essential (primary) hypertension: Secondary | ICD-10-CM

## 2021-05-03 NOTE — Telephone Encounter (Signed)
[facility] faxed refill request for the following medications:  lisinopril (ZESTRIL) 40 MG tablet   Please advise.  

## 2021-05-15 ENCOUNTER — Other Ambulatory Visit: Payer: Self-pay | Admitting: Physician Assistant

## 2021-05-15 DIAGNOSIS — S22000A Wedge compression fracture of unspecified thoracic vertebra, initial encounter for closed fracture: Secondary | ICD-10-CM

## 2021-05-15 DIAGNOSIS — F411 Generalized anxiety disorder: Secondary | ICD-10-CM

## 2021-05-15 DIAGNOSIS — F320 Major depressive disorder, single episode, mild: Secondary | ICD-10-CM

## 2021-05-15 NOTE — Telephone Encounter (Signed)
Pt has called again re  HYDROcodone-acetaminophen (NORCO/VICODIN) 5-325 MG tablet 120 tablet 0 04/12/2021    Sig - Route: Take 1 tablet by mouth every 6 (six) hours as needed for moderate pain. Will need appointment for refills. - Oral   Sent to pharmacy as: HYDROcodone-acetaminophen (NORCO/VICODIN) 5-325 MG tablet   Earliest Fill Date: 04/12/2021   Pls touch base with pt as her sister has terminal cancer and pt states this has put her in a lot of pain and anxiety. Pls advise

## 2021-05-16 ENCOUNTER — Telehealth: Payer: Self-pay

## 2021-05-16 MED ORDER — HYDROCODONE-ACETAMINOPHEN 5-325 MG PO TABS: 1.0000 | ORAL_TABLET | Freq: Four times a day (QID) | ORAL | 0 refills | Status: DC | PRN

## 2021-05-16 NOTE — Telephone Encounter (Signed)
Please advise refill? 

## 2021-05-16 NOTE — Telephone Encounter (Signed)
Copied from [place] [id number]. Topic: General - Other >> May 16, 2021  9:17 AM Leward Quan A wrote: Reason for CRM: Patient daughter Zenda Alpers called in to inquire of Dr Rosanna Randy if he can please fill  Rx for HYDROcodone-acetaminophen (NORCO/VICODIN) 5-325 MG tablet this one time since they were not aware before requesting the refill that they need to have an appointment. Being that she did not have her schedule available she was not able to schedule at this time and Dr Rosanna Randy earliest appointment is February of 2023. Please call Wells Guiles with an answer at Ph#   [phone]

## 2021-05-18 ENCOUNTER — Telehealth: Payer: Self-pay

## 2021-05-18 NOTE — Telephone Encounter (Signed)
Copied from [place] [id number]. Topic: Appointment Scheduling - Scheduling Inquiry for Clinic >> May 18, 2021 10:17 AM Alanda Slim E wrote: Reason for CRM: Pt called to schedule refill appt / first available with Daneil Dan was ok per Judson Roch but it was for 11.14.22 and 11.15.22/ pt stated she can only come to clinic when her daughter can bring her / her daughter will only be in town the first week of November / [id number]/ please advise if pt can be seen during those dates/ please advise

## 2021-05-21 NOTE — Telephone Encounter (Signed)
Left message on VM that [name] [name] can see her on 06/01/21 at 3:40 p.m.

## 2021-05-22 ENCOUNTER — Other Ambulatory Visit: Payer: Self-pay

## 2021-05-22 ENCOUNTER — Telehealth: Payer: Self-pay | Admitting: Family Medicine

## 2021-05-22 DIAGNOSIS — I10 Essential (primary) hypertension: Secondary | ICD-10-CM

## 2021-05-22 MED ORDER — LISINOPRIL 40 MG PO TABS: 40.0000 mg | ORAL_TABLET | Freq: Every day | ORAL | 0 refills | Status: DC

## 2021-05-22 NOTE — Telephone Encounter (Signed)
[facility] faxed refill request for the following medications:  lisinopril (ZESTRIL) 40 MG tablet   Please advise.  

## 2021-06-01 ENCOUNTER — Ambulatory Visit (INDEPENDENT_AMBULATORY_CARE_PROVIDER_SITE_OTHER): Payer: Medicare Other | Admitting: Family Medicine

## 2021-06-01 ENCOUNTER — Other Ambulatory Visit: Payer: Self-pay

## 2021-06-01 ENCOUNTER — Encounter: Payer: Self-pay | Admitting: Family Medicine

## 2021-06-01 VITALS — BP 136/64 | HR 81 | Wt 85.1 lb

## 2021-06-01 DIAGNOSIS — M4699 Unspecified inflammatory spondylopathy, multiple sites in spine: Secondary | ICD-10-CM

## 2021-06-01 DIAGNOSIS — M4316 Spondylolisthesis, lumbar region: Secondary | ICD-10-CM

## 2021-06-01 DIAGNOSIS — Z716 Tobacco abuse counseling: Secondary | ICD-10-CM | POA: Diagnosis not present

## 2021-06-01 DIAGNOSIS — F4320 Adjustment disorder, unspecified: Secondary | ICD-10-CM

## 2021-06-01 DIAGNOSIS — R636 Underweight: Secondary | ICD-10-CM

## 2021-06-01 MED ORDER — GABAPENTIN 600 MG PO TABS: 600.0000 mg | ORAL_TABLET | Freq: Three times a day (TID) | ORAL | 1 refills | Status: DC

## 2021-06-01 NOTE — Progress Notes (Signed)
Established patient visit   Patient: [name] [name]   DOB: 06-Jul-1946   75 y.o. Female  MRN: [id number] Visit Date: 06/01/2021  Today's healthcare provider: Gwyneth Sprout, FNP   Chief Complaint  Patient presents with   Hypertension   Anxiety   Hyperlipidemia   Subjective    HPI  Hypertension, follow-up  BP Readings from Last 3 Encounters:  06/01/21 136/64  12/08/20 (!) 145/62  07/07/20 (!) 170/81   Wt Readings from Last 3 Encounters:  06/01/21 85 lb 1.6 oz (38.6 kg)  12/08/20 89 lb (40.4 kg)  07/07/20 87 lb 3.2 oz (39.6 kg)     She was last seen for hypertension 6 months ago.  BP at that visit was 145/62. Management since that visit includes none.  She reports excellent compliance with treatment. She is not having side effects.  She is following a Regular diet. She is not exercising. She does smoke. Half a pack a day   Use of agents associated with hypertension: NSAIDS.   Outside blood pressures are . Symptoms: Yes chest pain No chest pressure  No palpitations No syncope  No dyspnea No orthopnea  No paroxysmal nocturnal dyspnea Yes lower extremity edema   Pertinent labs: Lab Results  Component Value Date   CHOL 142 12/12/2020   HDL 61 12/12/2020   LDLCALC 62 12/12/2020   TRIG 103 12/12/2020   CHOLHDL 2.3 12/12/2020   Lab Results  Component Value Date   NA 136 12/12/2020   K 4.4 12/12/2020   CREATININE 1.05 (H) 12/12/2020   EGFR 56 (L) 12/12/2020   GLUCOSE 102 (H) 12/12/2020   TSH 1.240 12/12/2020     The 10-year ASCVD risk score (Arnett DK, et al., 2019) is: 28.9%   ---------------------------------------------------------------------------------------------------  Lipid/Cholesterol, Follow-up  Last lipid panel Other pertinent labs  Lab Results  Component Value Date   CHOL 142 12/12/2020   HDL 61 12/12/2020   LDLCALC 62 12/12/2020   TRIG 103 12/12/2020   CHOLHDL 2.3 12/12/2020   Lab Results  Component Value Date   ALT 13 12/12/2020    AST 24 12/12/2020   PLT 225 12/12/2020   TSH 1.240 12/12/2020     She was last seen for this 6 months ago.  Management since that visit includes none.  She reports excellent compliance with treatment. She is having side effects.   Symptoms: Yes chest pain No chest pressure/discomfort  No dyspnea Yes lower extremity edema  Yes numbness or tingling of extremity No orthopnea  No palpitations No paroxysmal nocturnal dyspnea  No speech difficulty No syncope   Current diet: in general, a "healthy" diet   Current exercise: none  The 10-year ASCVD risk score (Arnett DK, et al., 2019) is: 28.9%  ---------------------------------------------------------------------------------------------------  Anxiety, Follow-up  She was last seen for anxiety 3 months ago. Changes made at last visit include none.   She reports poor compliance with treatment. Patient is taking 2 daily for better compliance  She reports good tolerance of treatment. She is not having side effects.   She feels her anxiety is severe and Worse since last visit.  Symptoms: Yes chest pain Yes difficulty concentrating  No dizziness Yes fatigue  Yes feelings of losing control Yes insomnia  No irritable No palpitations  Yes panic attacks Yes racing thoughts  Yes shortness of breath Yes sweating  Yes tremors/shakes    GAD-7 Results No flowsheet data found.  PHQ-9 Scores PHQ9 SCORE ONLY 12/08/2020 07/07/2020 05/09/2020  PHQ-9  Total Score 12 17 15     ---------------------------------------------------------------------------------------------------   Medications: Outpatient Medications Prior to Visit  Medication Sig   albuterol (VENTOLIN HFA) 108 (90 Base) MCG/ACT inhaler Inhale 2 puffs into the lungs every 4 (four) hours as needed for wheezing or shortness of breath.   ALPRAZolam (XANAX) 0.5 MG tablet TAKE 1 TABLET(0.5 MG) BY MOUTH TWICE DAILY AS NEEDED FOR ANXIETY   amLODipine (NORVASC) 5 MG tablet TAKE 1  TABLET(5 MG) BY MOUTH DAILY   aspirin 81 MG tablet Take 81 mg by mouth daily.   Calcium 600-200 MG-UNIT tablet Take 1 tablet by mouth daily.   cholecalciferol (VITAMIN D) 25 MCG (1000 UNIT) tablet Take 1,000 Units by mouth daily.   citalopram (CELEXA) 40 MG tablet TAKE 1 TABLET(40 MG) BY MOUTH DAILY   Docusate Sodium (COLACE PO) Take 100 mg by mouth daily as needed (constipation).    furosemide (LASIX) 20 MG tablet Take 20 mg by mouth daily as needed for fluid.    HYDROcodone-acetaminophen (NORCO/VICODIN) 5-325 MG tablet Take 1 tablet by mouth every 6 (six) hours as needed for moderate pain. Will need appointment for refills.   ibuprofen (ADVIL,MOTRIN) 600 MG tablet Take 600 mg by mouth every 8 (eight) hours as needed.   lisinopril (ZESTRIL) 40 MG tablet Take 1 tablet (40 mg total) by mouth daily.   metoprolol succinate (TOPROL-XL) 25 MG 24 hr tablet TAKE 1 TABLET(25 MG) BY MOUTH DAILY   pantoprazole (PROTONIX) 20 MG tablet TAKE 1 TABLET(20 MG) BY MOUTH DAILY   potassium chloride SA (K-DUR,KLOR-CON) 20 MEQ tablet Take 1 tablet (20 mEq total) by mouth daily.   simvastatin (ZOCOR) 10 MG tablet TAKE 1 TABLET(10 MG) BY MOUTH DAILY   Tiotropium Bromide Monohydrate (SPIRIVA RESPIMAT) 1.25 MCG/ACT AERS Inhale 1 puff into the lungs daily.   mometasone-formoterol (DULERA) 200-5 MCG/ACT AERO Inhale 2 puffs into the lungs 2 (two) times daily. (Patient not taking: Reported on 06/01/2021)   [DISCONTINUED] Fluticasone-Umeclidin-Vilant (TRELEGY ELLIPTA) 100-62.5-25 MCG/INH AEPB Inhale 1 puff into the lungs daily.   No facility-administered medications prior to visit.    Review of Systems     Objective    BP 136/64   Pulse 81   Wt 85 lb 1.6 oz (38.6 kg)   SpO2 99%   BMI 15.56 kg/m    Physical Exam Vitals and nursing note reviewed.  Constitutional:      General: She is not in acute distress.    Appearance: She is cachectic. She is not ill-appearing, toxic-appearing or diaphoretic.  HENT:      Head: Normocephalic and atraumatic.  Cardiovascular:     Rate and Rhythm: Normal rate and regular rhythm.     Pulses: Normal pulses.     Heart sounds: Normal heart sounds. No murmur heard.   No friction rub. No gallop.  Pulmonary:     Effort: Pulmonary effort is normal. No respiratory distress.     Breath sounds: Normal breath sounds. No stridor. No wheezing, rhonchi or rales.  Chest:     Chest wall: No tenderness.  Abdominal:     General: Bowel sounds are normal.     Palpations: Abdomen is soft.  Musculoskeletal:        General: No swelling, tenderness, deformity or signs of injury. Normal range of motion.     Right lower leg: No edema.     Left lower leg: No edema.     Comments: Curved, humped spinal column  Skin:    General: Skin is  warm and dry.     Capillary Refill: Capillary refill takes less than 2 seconds.     Coloration: Skin is not jaundiced or pale.     Findings: No bruising, erythema, lesion or rash.  Neurological:     General: No focal deficit present.     Mental Status: She is alert and oriented to person, place, and time. Mental status is at baseline.     Cranial Nerves: No cranial nerve deficit.     Sensory: No sensory deficit.     Motor: No weakness.     Coordination: Coordination normal.  Psychiatric:        Mood and Affect: Mood normal.        Behavior: Behavior normal.        Thought Content: Thought content normal.        Judgment: Judgment normal.     No results found for any visits on 06/01/21.  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    On Mobic tx, 7.5 mg; slight relief report, advised no additional NSAIDs Trial of gabapentin to assist with additional pain relief      Relevant Medications    gabapentin (NEURONTIN) 600 MG tablet     Other   Encounter for smoking cessation counseling    Sister currently dx'd with lung cancer with mets; advised smoking cessation       Underweight due to inadequate caloric intake    Reports in "too much pain" to eat; current weight 85#      Adult situational stress disorder    -lives alone -does not feel safe to drive -sister sick, likely to die -will need to put dog down -relies on daughter to help with travel        Return in about 3 months (around 09/01/2021) for chonic disease management.     Vonna Kotyk, FNP, have reviewed all documentation for this visit. The documentation on 06/01/21 for the exam, diagnosis, procedures, and orders are all accurate and complete.    Gwyneth Sprout, [place] [phone] (phone) [phone] (fax)  [place]

## 2021-06-01 NOTE — Assessment & Plan Note (Signed)
-  lives alone -does not feel safe to drive -sister sick, likely to die -will need to put dog down -relies on daughter to help with travel

## 2021-06-01 NOTE — Assessment & Plan Note (Signed)
Reports in "too much pain" to eat; current weight 85#

## 2021-06-01 NOTE — Assessment & Plan Note (Signed)
Found on imaging; advised PT- pt currently refusing d/t family concerns

## 2021-06-01 NOTE — Assessment & Plan Note (Signed)
Sister currently dx'd with lung cancer with mets; advised smoking cessation

## 2021-06-01 NOTE — Assessment & Plan Note (Signed)
On Mobic tx, 7.5 mg; slight relief report, advised no additional NSAIDs Trial of gabapentin to assist with additional pain relief

## 2021-06-12 ENCOUNTER — Other Ambulatory Visit: Payer: Self-pay | Admitting: Family Medicine

## 2021-06-12 DIAGNOSIS — E7800 Pure hypercholesterolemia, unspecified: Secondary | ICD-10-CM

## 2021-06-12 MED ORDER — SIMVASTATIN 10 MG PO TABS: ORAL_TABLET | ORAL | 1 refills | Status: DC

## 2021-06-12 NOTE — Telephone Encounter (Signed)
[facility] faxed refill request for the following medications:  simvastatin (ZOCOR) 10 MG tablet   Please advise.

## 2021-06-15 ENCOUNTER — Other Ambulatory Visit: Payer: Self-pay | Admitting: Family Medicine

## 2021-06-15 DIAGNOSIS — S22000A Wedge compression fracture of unspecified thoracic vertebra, initial encounter for closed fracture: Secondary | ICD-10-CM

## 2021-06-15 NOTE — Telephone Encounter (Signed)
Medication Refill - Medication: Hydrocodone   Has the patient contacted their pharmacy? Yes.   Pt was told to contact her PCP. Please advise. (Agent: If no, request that the patient contact the pharmacy for the refill. If patient does not wish to contact the pharmacy document the reason why and proceed with request.) (Agent: If yes, when and what did the pharmacy advise?)  Preferred Pharmacy (with phone number or street name):  [facility] [place], [place] [address]  [place] [address]  Phone: [phone] Fax: [phone]  Hours: Not open 24 hours   Has the patient been seen for an appointment in the last year OR does the patient have an upcoming appointment? Yes.    Agent: Please be advised that RX refills may take up to 3 business days. We ask that you follow-up with your pharmacy.

## 2021-06-16 NOTE — Telephone Encounter (Signed)
Requested medications are due for refill today yes  Requested medications are on the active medication list yes  Last refill 30 days ago  Last visit 06/01/21  Future visit scheduled Not with this practice, was Dr Evert Kohl pt, appt with Rocco Serene  Notes to clinic Not delegated and UDS not up to date, please assess if this is still a pt in this practice. Requested Prescriptions  Pending Prescriptions Disp Refills   HYDROcodone-acetaminophen (NORCO/VICODIN) 5-325 MG tablet 120 tablet 0    Sig: Take 1 tablet by mouth every 6 (six) hours as needed for moderate pain. Will need appointment for refills.     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

## 2021-06-19 MED ORDER — SIMVASTATIN 10 MG PO TABS: ORAL_TABLET | ORAL | 1 refills | Status: DC

## 2021-06-19 NOTE — Addendum Note (Signed)
Addended by: Matilde Sprang on: 06/19/2021 03:33 PM   Modules accepted: Orders

## 2021-06-19 NOTE — Telephone Encounter (Signed)
Patient called, left VM to return the call to the office about the refill request.   Resent to pharmacy.  Requested Prescriptions  Pending Prescriptions Disp Refills  . simvastatin (ZOCOR) 10 MG tablet 90 tablet 1    Sig: TAKE 1 TABLET(10 MG) BY MOUTH DAILY     Cardiovascular:  Antilipid - Statins Passed - 06/19/2021  3:33 PM      Passed - Total Cholesterol in normal range and within 360 days    Cholesterol, Total  Date Value Ref Range Status  12/12/2020 142 100 - 199 mg/dL Final         Passed - LDL in normal range and within 360 days    LDL Chol Calc (NIH)  Date Value Ref Range Status  12/12/2020 62 0 - 99 mg/dL Final         Passed - HDL in normal range and within 360 days    HDL  Date Value Ref Range Status  12/12/2020 61 >39 mg/dL Final         Passed - Triglycerides in normal range and within 360 days    Triglycerides  Date Value Ref Range Status  12/12/2020 103 0 - 149 mg/dL Final         Passed - Patient is not pregnant      Passed - Valid encounter within last 12 months    Recent Outpatient Visits          2 weeks ago 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 [facility])   [facility] Gwyneth Sprout, FNP   6 months ago Primary hypertension   [employer], Vickki Muff, PA-C   11 months ago Primary hypertension   [employer], Clearnce Sorrel, [place]   1 year ago Need for influenza vaccination   [place], [place], [place]   1 year ago Mixed simple and mucopurulent chronic bronchitis [facility])   [place], Clearnce Sorrel, [place]      Future Appointments            In 1 month Karamalegos, Devonne Doughty, DO [facility], [facility]           Signed Prescriptions Disp Refills   simvastatin (ZOCOR) 10 MG tablet 90 tablet 1    Sig: TAKE 1 TABLET(10 MG) BY MOUTH DAILY     There is no refill protocol information for this order

## 2021-06-19 NOTE — Telephone Encounter (Signed)
Patient called in , refill denying stating she needs an appt, but the patient already had one 11/04 with Dr Rollene Rotunda and has an upcoming appt also.

## 2021-06-20 ENCOUNTER — Other Ambulatory Visit: Payer: Self-pay

## 2021-06-20 DIAGNOSIS — S22000A Wedge compression fracture of unspecified thoracic vertebra, initial encounter for closed fracture: Secondary | ICD-10-CM

## 2021-06-20 NOTE — Telephone Encounter (Signed)
Copied from [place] [id number]. Topic: Quick Communication - Rx Refill/Question >> Jun 20, 2021  4:03 PM Tessa Lerner A wrote: Medication: HYDROcodone-acetaminophen (NORCO/VICODIN) 5-325 MG tablet [[id number]]   Has the patient contacted their pharmacy? Yes.  The patient has been directed to contact their PCP  (Agent: If no, request that the patient contact the pharmacy for the refill. If patient does not wish to contact the pharmacy document the reason why and proceed with request.) (Agent: If yes, when and what did the pharmacy advise?)  Preferred Pharmacy (with phone number or street name): [place] [facility] [place], [place] [address] [place]  Phone:  [phone] Fax:  [phone]  Has the patient been seen for an appointment in the last year OR does the patient have an upcoming appointment? Yes.    Agent: Please be advised that RX refills may take up to 3 business days. We ask that you follow-up with your pharmacy.

## 2021-06-22 NOTE — Telephone Encounter (Signed)
Requested medication (s) are due for refill today: Yes  Requested medication (s) are on the active medication list: Yes  Last refill:  05/16/21 #120/0 RF  Future visit scheduled: Yes with Dr. Parks Ranger at [facility]  Notes to clinic:  Unable to refill per protocol, cannot delegate.      Requested Prescriptions  Pending Prescriptions Disp Refills   HYDROcodone-acetaminophen (NORCO/VICODIN) 5-325 MG tablet 120 tablet 0    Sig: Take 1 tablet by mouth every 6 (six) hours as needed for moderate pain. Will need appointment for refills.     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

## 2021-06-26 NOTE — Telephone Encounter (Signed)
Patients daughter called in, not understanding why medication is beng held up. She had an appt on 11/04 with Dr Rollene Rotunda and also has an appt on 08/17/21 with Dr Parks Ranger

## 2021-06-28 MED ORDER — HYDROCODONE-ACETAMINOPHEN 5-325 MG PO TABS: 1.0000 | ORAL_TABLET | Freq: Four times a day (QID) | ORAL | 0 refills | Status: DC | PRN

## 2021-07-07 ENCOUNTER — Other Ambulatory Visit: Payer: Self-pay | Admitting: Family Medicine

## 2021-07-07 DIAGNOSIS — M4699 Unspecified inflammatory spondylopathy, multiple sites in spine: Secondary | ICD-10-CM

## 2021-07-07 NOTE — Telephone Encounter (Signed)
last RF 06/01/21 #90 1 RF too soon- has yet to establish with [facility] Pt is in between providers.  Requested Prescriptions  Refused Prescriptions Disp Refills  . 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

## 2021-08-01 ENCOUNTER — Other Ambulatory Visit: Payer: Self-pay | Admitting: Family Medicine

## 2021-08-01 DIAGNOSIS — S22000A Wedge compression fracture of unspecified thoracic vertebra, initial encounter for closed fracture: Secondary | ICD-10-CM

## 2021-08-01 NOTE — Telephone Encounter (Signed)
Medication Refill - Medication: HYDROcodone-acetaminophen (NORCO/VICODIN) 5-325 MG tablet  Has the patient contacted their pharmacy? No. She said she has to call in every month  Preferred Pharmacy (with phone number or street name): [facility] [place], [place] Has the patient been seen for an appointment in the last year OR does the patient have an upcoming appointment? Yes.    Agent: Please be advised that RX refills may take up to 3 business days. We ask that you follow-up with your pharmacy.

## 2021-08-02 NOTE — Telephone Encounter (Signed)
Requested medications are due for refill today.  yes  Requested medications are on the active medications list.  yes  Last refill. 06/28/2021  Future visit scheduled.   No. Has an appointment scheduled at [facility]  Notes to clinic.  Medication not delegated.    Requested Prescriptions  Pending Prescriptions Disp Refills   HYDROcodone-acetaminophen (NORCO/VICODIN) 5-325 MG tablet 120 tablet 0    Sig: Take 1 tablet by mouth every 6 (six) hours as needed for moderate pain. Will need appointment for refills.     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, Clearnce Sorrel, [place]       Future Appointments             In 2 weeks Parks Ranger, Devonne Doughty, DO [facility], [facility]

## 2021-08-03 MED ORDER — HYDROCODONE-ACETAMINOPHEN 5-325 MG PO TABS: 1.0000 | ORAL_TABLET | Freq: Four times a day (QID) | ORAL | 0 refills | Status: DC | PRN

## 2021-08-06 ENCOUNTER — Other Ambulatory Visit: Payer: Self-pay | Admitting: Family Medicine

## 2021-08-06 DIAGNOSIS — I10 Essential (primary) hypertension: Secondary | ICD-10-CM

## 2021-08-09 DIAGNOSIS — H2513 Age-related nuclear cataract, bilateral: Secondary | ICD-10-CM | POA: Diagnosis not present

## 2021-08-10 DIAGNOSIS — H2513 Age-related nuclear cataract, bilateral: Secondary | ICD-10-CM | POA: Diagnosis not present

## 2021-08-16 ENCOUNTER — Ambulatory Visit: Payer: Medicare Other | Admitting: Family Medicine

## 2021-08-17 ENCOUNTER — Other Ambulatory Visit: Payer: Self-pay

## 2021-08-17 ENCOUNTER — Encounter: Payer: Self-pay | Admitting: Family Medicine

## 2021-08-17 ENCOUNTER — Ambulatory Visit (INDEPENDENT_AMBULATORY_CARE_PROVIDER_SITE_OTHER): Payer: Medicare Other | Admitting: Family Medicine

## 2021-08-17 VITALS — BP 129/56 | HR 59 | Ht 62.5 in | Wt 85.6 lb

## 2021-08-17 DIAGNOSIS — F411 Generalized anxiety disorder: Secondary | ICD-10-CM

## 2021-08-17 DIAGNOSIS — M545 Low back pain, unspecified: Secondary | ICD-10-CM

## 2021-08-17 DIAGNOSIS — M4699 Unspecified inflammatory spondylopathy, multiple sites in spine: Secondary | ICD-10-CM

## 2021-08-17 DIAGNOSIS — F410 Panic disorder [episodic paroxysmal anxiety] without agoraphobia: Secondary | ICD-10-CM

## 2021-08-17 DIAGNOSIS — F5104 Psychophysiologic insomnia: Secondary | ICD-10-CM | POA: Insufficient documentation

## 2021-08-17 DIAGNOSIS — Z7689 Persons encountering health services in other specified circumstances: Secondary | ICD-10-CM | POA: Diagnosis not present

## 2021-08-17 DIAGNOSIS — G8929 Other chronic pain: Secondary | ICD-10-CM | POA: Insufficient documentation

## 2021-08-17 MED ORDER — MELOXICAM 7.5 MG PO TABS: 7.5000 mg | ORAL_TABLET | Freq: Every day | ORAL | 0 refills | Status: DC | PRN

## 2021-08-17 NOTE — Progress Notes (Signed)
Subjective:    Patient ID: [name] [name], female    DOB: 1945/10/14, 76 y.o.   MRN: [id number]  [name] [name] is a 76 y.o. female presenting on 08/17/2021 for [place] followed by [facility], a lot of change in doctors previously.  Here with daughter [name] [name].  HPI  Chronic Back Pain / Inflammatory Spondylopathy  Followed by [facility] Dr Kayleen Memos Hx Shoulder Fracture / Kyphoplasty failed. Taking Hydrocodone-Acetaminophen PRN q 6 hr PRN up to 4 times daily, for past 6 years since broke shoulder. Recently was placed on Gabapentin 627m PRN  Most of her pain is mostly Lower back, she was dx with Spondylolesthesis L5 - S1 Taking Meloxicam 7.562mPRN she was given 30 days and it helped but it ran out and she did not go back to orthopedics  Anxiety Insomnia Chronic problem with anxiety, taking Xanax 0.63m49mID PRN for past 20+ years. Husband passed 9 years ago. She also lost her parents and her sister. Overwhelmed more recently with life changes with loss of family members. Admits caffeine making her more anxious.   [facility] 12/08[facility]2020 07/07/2020 05/09/2020  Decreased Interest 1 2 3   Down, Depressed, Hopeless 1 2 3   PHQ - 2 Score 2 4 6   Altered sleeping 3 3 3   Tired, decreased energy 3 3 3   Change in appetite 2 3 3   Feeling bad or failure about yourself  1 1 0  Trouble concentrating 1 1 0  Moving slowly or fidgety/restless 0 2 0  Suicidal thoughts 0 0 0  PHQ-9 Score 12 17 15   Difficult doing work/chores Extremely dIfficult Extremely dIfficult Extremely dIfficult  Some recent data might be hidden    Past Medical History:  Diagnosis Date   Anxiety    CAD (coronary artery disease) unk   COPD (chronic obstructive pulmonary disease) (HCC)    Depression    GERD (gastroesophageal reflux disease)    Hypercholesteremia unk   Hypertension    [facility]  Past Surgical History:  Procedure Laterality Date   ABDOMINAL  HYSTERECTOMY  1982   BACK SURGERY     COLONOSCOPY WITH PROPOFOL N/A 04/07/2018   Procedure: COLONOSCOPY WITH PROPOFOL;  Surgeon: WohLucilla LameD;  Location: [facility] ENDOSCOPY;  Service: Endoscopy;  Laterality: N/A;   KYPHOPLASTY N/A 10/23/2017   Procedure: [id number]: MenHessie KnowsD;  Location: [facility] ORS;  Service: Orthopedics;  Laterality: N/A;   Social History   Socioeconomic History   Marital status: Widowed    Spouse name: Not on file   Number of children: 1   Years of education: Not on file   Highest education level: Some college, no degree  Occupational History    Employer: RETIRED  Tobacco Use   Smoking status: Every Day    Packs/day: 0.50    Years: 53.00    Pack years: 26.50    Types: Cigarettes   Smokeless tobacco: Never  Vaping Use   Vaping Use: Never used  Substance and Sexual Activity   Alcohol use: Yes    Alcohol/week: 2.0 standard drinks    Types: 2 Glasses of wine per week   Drug use: Not Currently    Types: Marijuana    Comment: in the past   Sexual activity: Not on file  Other Topics Concern   Not on file  Social History Narrative   Not on file   Social Determinants of Health   Financial Resource Strain: Not on file  Food Insecurity: Not on file  Transportation Needs: Not on file  Physical Activity: Not on file  Stress: Not on file  Social Connections: Not on file  Intimate Partner Violence: Not on file   Family History  Problem Relation Age of Onset   Leukemia Mother    Aneurysm Father    Diabetes Father    Heart disease Father    Liver cancer Sister    Cancer Sister    Lung cancer Sister        metastatic   Diabetes Maternal Grandmother    Diabetes Paternal Grandmother    Current Outpatient Medications on File Prior to Visit  Medication Sig   albuterol (VENTOLIN HFA) 108 (90 Base) MCG/ACT inhaler Inhale 2 puffs into the lungs every 4 (four) hours as needed for wheezing or shortness of breath.   ALPRAZolam (XANAX) 0.5 MG  tablet TAKE 1 TABLET(0.5 MG) BY MOUTH TWICE DAILY AS NEEDED FOR ANXIETY   amLODipine (NORVASC) 5 MG tablet TAKE 1 TABLET(5 MG) BY MOUTH DAILY   aspirin 81 MG tablet Take 81 mg by mouth daily.   Calcium 600-200 MG-UNIT tablet Take 1 tablet by mouth daily.   cholecalciferol (VITAMIN D) 25 MCG (1000 UNIT) tablet Take 1,000 Units by mouth daily.   citalopram (CELEXA) 40 MG tablet TAKE 1 TABLET(40 MG) BY MOUTH DAILY   Docusate Sodium (COLACE PO) Take 100 mg by mouth daily as needed (constipation).    furosemide (LASIX) 20 MG tablet Take 20 mg by mouth daily as needed for fluid.    gabapentin (NEURONTIN) 600 MG tablet Take 1 tablet (600 mg total) by mouth 3 (three) times daily.   HYDROcodone-acetaminophen (NORCO/VICODIN) 5-325 MG tablet Take 1 tablet by mouth every 6 (six) hours as needed for moderate pain.   lisinopril (ZESTRIL) 40 MG tablet TAKE 1 TABLET(40 MG) BY MOUTH DAILY   metoprolol succinate (TOPROL-XL) 25 MG 24 hr tablet TAKE 1 TABLET(25 MG) BY MOUTH DAILY   mometasone-formoterol (DULERA) 200-5 MCG/ACT AERO Inhale 2 puffs into the lungs 2 (two) times daily. (Patient not taking: Reported on 06/01/2021)   pantoprazole (PROTONIX) 20 MG tablet TAKE 1 TABLET(20 MG) BY MOUTH DAILY   potassium chloride SA (K-DUR,KLOR-CON) 20 MEQ tablet Take 1 tablet (20 mEq total) by mouth daily.   simvastatin (ZOCOR) 10 MG tablet TAKE 1 TABLET(10 MG) BY MOUTH DAILY   Tiotropium Bromide Monohydrate (SPIRIVA RESPIMAT) 1.25 MCG/ACT AERS Inhale 1 puff into the lungs daily.   No current facility-administered medications on file prior to visit.    Review of Systems Per HPI unless specifically indicated above      Objective:    BP (!) 129/56    Pulse (!) 59    Ht 5' 2.5" (1.588 m)    Wt 85 lb 9.6 oz (38.8 kg)    SpO2 95%    BMI 15.41 kg/m   Wt Readings from Last 3 Encounters:  08/17/21 85 lb 9.6 oz (38.8 kg)  06/01/21 85 lb 1.6 oz (38.6 kg)  12/08/20 89 lb (40.4 kg)    Physical Exam Vitals and nursing note  reviewed.  Constitutional:      General: She is not in acute distress.    Appearance: Normal appearance. She is well-developed. She is not diaphoretic.     Comments: Well-appearing, comfortable, cooperative  HENT:     Head: Normocephalic and atraumatic.  Eyes:     General:        Right eye: No discharge.  Left eye: No discharge.     Conjunctiva/sclera: Conjunctivae normal.  Cardiovascular:     Rate and Rhythm: Normal rate.  Pulmonary:     Effort: Pulmonary effort is normal.  Skin:    General: Skin is warm and dry.     Findings: No erythema or rash.  Neurological:     Mental Status: She is alert and oriented to person, place, and time.  Psychiatric:        Mood and Affect: Mood normal.        Behavior: Behavior normal.        Thought Content: Thought content normal.     Comments: Well groomed, good eye contact, normal speech and thoughts   Results for orders placed or performed in visit on 12/08/20  CBC with Differential/Platelet  Result Value Ref Range   WBC 6.3 3.4 - 10.8 x10E3/uL   RBC 3.81 3.77 - 5.28 x10E6/uL   Hemoglobin 12.4 11.1 - 15.9 g/dL   Hematocrit 37.3 34.0 - 46.6 %   MCV 98 (H) 79 - 97 fL   MCH 32.5 26.6 - 33.0 pg   MCHC 33.2 31.5 - 35.7 g/dL   RDW 12.5 11.7 - 15.4 %   Platelets 225 150 - 450 x10E3/uL   Neutrophils 51 Not Estab. %   Lymphs 36 Not Estab. %   Monocytes 8 Not Estab. %   Eos 4 Not Estab. %   Basos 1 Not Estab. %   Neutrophils Absolute 3.1 1.4 - 7.0 x10E3/uL   Lymphocytes Absolute 2.3 0.7 - 3.1 x10E3/uL   Monocytes Absolute 0.5 0.1 - 0.9 x10E3/uL   EOS (ABSOLUTE) 0.2 0.0 - 0.4 x10E3/uL   Basophils Absolute 0.1 0.0 - 0.2 x10E3/uL   Immature Granulocytes 0 Not Estab. %   Immature Grans (Abs) 0.0 0.0 - 0.1 x10E3/uL  Comprehensive metabolic panel  Result Value Ref Range   Glucose 102 (H) 65 - 99 mg/dL   BUN 14 8 - 27 mg/dL   Creatinine, Ser 1.05 (H) 0.57 - 1.00 mg/dL   eGFR 56 (L) >59 mL/min/1.73   BUN/Creatinine Ratio 13 12 - 28    Sodium 136 134 - 144 mmol/L   Potassium 4.4 3.5 - 5.2 mmol/L   Chloride 101 96 - 106 mmol/L   CO2 20 20 - 29 mmol/L   Calcium 9.4 8.7 - 10.3 mg/dL   Total Protein 7.2 6.0 - 8.5 g/dL   Albumin 4.6 3.7 - 4.7 g/dL   Globulin, Total 2.6 1.5 - 4.5 g/dL   Albumin/Globulin Ratio 1.8 1.2 - 2.2   Bilirubin Total 0.3 0.0 - 1.2 mg/dL   Alkaline Phosphatase 110 44 - 121 IU/L   AST 24 0 - 40 IU/L   ALT 13 0 - 32 IU/L  VITAMIN D 25 Hydroxy (Vit-D Deficiency, Fractures)  Result Value Ref Range   Vit D, 25-Hydroxy 27.6 (L) 30.0 - 100.0 ng/mL  Lipid panel  Result Value Ref Range   Cholesterol, Total 142 100 - 199 mg/dL   Triglycerides 103 0 - 149 mg/dL   HDL 61 >39 mg/dL   VLDL Cholesterol Cal 19 5 - 40 mg/dL   LDL Chol Calc (NIH) 62 0 - 99 mg/dL   Chol/HDL Ratio 2.3 0.0 - 4.4 ratio  TSH  Result Value Ref Range   TSH 1.240 0.450 - 4.500 uIU/mL      Assessment & Plan:   Problem List Items Addressed This Visit     Psychophysiological insomnia   Inflammatory spondylopathy of  multiple sites in spine ([place]) - Primary   Relevant Medications   meloxicam (MOBIC) 7.5 MG tablet   GAD (generalized anxiety disorder)   Chronic bilateral low back pain without sciatica   Relevant Medications   meloxicam (MOBIC) 7.5 MG tablet   Other Visit Diagnoses     Encounter to establish care with new doctor           Establish care  Inflammatory spondylopathy spine Chronic Pain Managed currently on opiate therapy Hydrocodone PRN for 6+ years, tolerating opiate well without issue or complication. Also on Gabapentin 672m nightly Restart NSAID Meloxicam 7.571mPRN dosing as advised Agree to continue pain management for her going forward, as she has been managed by spine / orthopedics and not pursuing surgical intervention at this time. Will consider alternative therapy with Tramadol in future if may provide better option for her pain.  GAD w/ panic Insomnia Chronic problem, on BDZ xanax x 20 years,  tolerating well with good results Multiple recent worse stressors with loss of most family members over the years, recent worsening from loss. Taking Xanax 0.36m52mID, with rare PRN 3rd dose, may trial increased dose until next visit Continues Citalopram 16m36mily    Meds ordered this encounter  Medications   meloxicam (MOBIC) 7.5 MG tablet    Sig: Take 1 tablet (7.5 mg total) by mouth daily as needed for pain (arthritis).    Dispense:  30 tablet    Refill:  0      Follow up plan: Return in about 3 weeks (around 09/07/2021) for 3 week follow-up for Chronic Back Pain / Anxiety med refills.  AlexNobie Putnam [facility] 08/17/2021, 3:25 PM

## 2021-08-17 NOTE — Patient Instructions (Addendum)
Thank you for coming to the office today.  Trial the increased dose of Xanax which would up to 3 pills per day max, keep the twice a day plus may take a 3rd pill in the afternoon if you need it. Next visit we can re-discuss if this is working and order  For the pain, keep on the hydrocodone for now, we can reconsider options such as Tramadol which can work sometimes even better.  Continue on the Gabapentin 600mg  twice per day.  Start back on Meloxicam 7.5mg  daily as needed, can skip some days if not need, prefer to avoid DAILY use because it can damage kidneys.  Do NOT take WITH Ibuprofen, Advil, Aleve, Naproxen  Recommend to start taking Tylenol Extra Strength 500mg  tabs - take 1 to 2 tabs per dose (max 1000mg ) every 6-8 hours for pain (take regularly, don't skip a dose for next 7 days), max 24 hour daily dose is 6 tablets or 3000mg . In the future you can repeat the same everyday Tylenol course for 1-2 weeks at a time.   Please schedule a Follow-up Appointment to: Return in about 3 weeks (around 09/07/2021) for 3 week follow-up for Chronic Back Pain / Anxiety med refills.  If you have any other questions or concerns, please feel free to call the office or send a message through [place]. You may also schedule an earlier appointment if necessary.  Additionally, you may be receiving a survey about your experience at our office within a few days to 1 week by e-mail or mail. We value your feedback.  Nobie Putnam, DO [place]

## 2021-09-04 DIAGNOSIS — H2512 Age-related nuclear cataract, left eye: Secondary | ICD-10-CM | POA: Diagnosis not present

## 2021-09-06 ENCOUNTER — Other Ambulatory Visit: Payer: Self-pay

## 2021-09-06 ENCOUNTER — Ambulatory Visit (INDEPENDENT_AMBULATORY_CARE_PROVIDER_SITE_OTHER): Payer: Medicare Other

## 2021-09-06 DIAGNOSIS — Z0000 Encounter for general adult medical examination without abnormal findings: Secondary | ICD-10-CM

## 2021-09-06 NOTE — Progress Notes (Addendum)
Virtual Visit via Telephone Note  I connected with Takia Runyon on 09/06/21 at 10:20 AM EST by telephone and verified that I am speaking with the correct person using two identifiers.  Location: Patient: [name] [name] Provider: Nobie Putnam, DO   I discussed the limitations, risks, security and privacy concerns of performing an evaluation and management service by telephone and the availability of in person appointments. I also discussed with the patient that there may be a patient responsible charge related to this service. The patient expressed understanding and agreed to proceed.   The patient was provided an opportunity to ask questions and all were answered.  I provided 30 minutes of non-face-to-face time during this encounter.   Wilson Singer, CMA   HPI: The pt presence for a telephone virtual visit for his Subsequent Medicare Annual Wellness  Past Medical History:  Diagnosis Date   Anxiety    CAD (coronary artery disease) unk   COPD (chronic obstructive pulmonary disease) ([place])    Depression    GERD (gastroesophageal reflux disease)    Hypercholesteremia unk   Hypertension    Stroke [facility])     Current Outpatient Medications  Medication Sig Dispense Refill   albuterol (VENTOLIN HFA) 108 (90 Base) MCG/ACT inhaler Inhale 2 puffs into the lungs every 4 (four) hours as needed for wheezing or shortness of breath. 18 g 5   ALPRAZolam (XANAX) 0.5 MG tablet TAKE 1 TABLET(0.5 MG) BY MOUTH TWICE DAILY AS NEEDED FOR ANXIETY 60 tablet 5   amLODipine (NORVASC) 5 MG tablet TAKE 1 TABLET(5 MG) BY MOUTH DAILY 90 tablet 3   aspirin 81 MG tablet Take 81 mg by mouth daily.     Calcium 600-200 MG-UNIT tablet Take 1 tablet by mouth daily.     cholecalciferol (VITAMIN D) 25 MCG (1000 UNIT) tablet Take 1,000 Units by mouth daily.     citalopram (CELEXA) 40 MG tablet TAKE 1 TABLET(40 MG) BY MOUTH DAILY 30 tablet 3   Docusate Sodium (COLACE PO) Take 100 mg by mouth daily as needed  (constipation).      furosemide (LASIX) 20 MG tablet Take 20 mg by mouth daily as needed for fluid.      gabapentin (NEURONTIN) 600 MG tablet Take 1 tablet (600 mg total) by mouth 3 (three) times daily. 90 tablet 1   HYDROcodone-acetaminophen (NORCO/VICODIN) 5-325 MG tablet Take 1 tablet by mouth every 6 (six) hours as needed for moderate pain. 120 tablet 0   lisinopril (ZESTRIL) 40 MG tablet TAKE 1 TABLET(40 MG) BY MOUTH DAILY 90 tablet 0   meloxicam (MOBIC) 7.5 MG tablet Take 1 tablet (7.5 mg total) by mouth daily as needed for pain (arthritis). 30 tablet 0   metoprolol succinate (TOPROL-XL) 25 MG 24 hr tablet TAKE 1 TABLET(25 MG) BY MOUTH DAILY 90 tablet 3   mometasone-formoterol (DULERA) 200-5 MCG/ACT AERO Inhale 2 puffs into the lungs 2 (two) times daily. (Patient not taking: Reported on 06/01/2021) 13 g 3   pantoprazole (PROTONIX) 20 MG tablet TAKE 1 TABLET(20 MG) BY MOUTH DAILY 90 tablet 1   potassium chloride SA (K-DUR,KLOR-CON) 20 MEQ tablet Take 1 tablet (20 mEq total) by mouth daily. 90 tablet 1   simvastatin (ZOCOR) 10 MG tablet TAKE 1 TABLET(10 MG) BY MOUTH DAILY 90 tablet 1   Tiotropium Bromide Monohydrate (SPIRIVA RESPIMAT) 1.25 MCG/ACT AERS Inhale 1 puff into the lungs daily. 4 g 5   No current facility-administered medications for this visit.    No Known Allergies  Family History  Problem Relation Age of Onset   Leukemia Mother    Aneurysm Father    Diabetes Father    Heart disease Father    Liver cancer Sister    Cancer Sister    Lung cancer Sister        metastatic   Diabetes Maternal Grandmother    Diabetes Paternal Grandmother        Hospitiliaztions: Declined any Hospital visit over the last year.  Health Maintenance:    Mammogram:03/06/2018. Declined mammogram   Pap Smear: Declined pap smear , Hysterectomy   1983   Bone Density: pt will call and schedule bone density, order placed  Colon Screening: need to be scheduled for colonoscopy. Need to be  scheduled with Dr. Adella Hare Doctor: 07/2021, Dr. Peter Garter [facility]  /Cataract surgery scheduled 09/24/2021   Dental Exam: 06/2021   Providers:   PCP: Nobie Putnam, DO    Cardiologist: [facility]. [facility]   Gastroenterologist: Dr. Allen Norris              Urologist: Dr. John Giovanni   I have personally reviewed and have noted:  1. The patient's medical and social history 2. Their use of alcohol, tobacco or illicit drugs 3. Their current medications and supplements 4. The patient's functional ability including ADL's, fall risks, home safety risks and  hearing or visual impairment. 5. Diet and physical activities 6. Evidence for depression or mood disorder  Subjective:   Review of Systems:   Constitutional: Denies fever, malaise, fatigue, headache or abrupt weight changes.  HEENT: Denies eye pain, eye redness, ear pain, ringing in the ears, wax buildup, runny nose, nasal congestion, bloody nose, or sore throat. Respiratory: Difficulty breathing, shortness of breath from her history of emphysema denies cough or sputum production.   Cardiovascular: Denies chest pain, chest tightness, palpitations or swelling in the hands or feet.  Gastrointestinal: Denies abdominal pain. Intermittent bloating, constipation. Denies diarrhea or blood in the stool.  GU: Denies urgency, frequency, pain with urination, burning sensation, blood in urine, odor or discharge. Musculoskeletal: Denies decrease in range of motion, difficulty with gait, muscle pain or joint pain and swelling.  Skin: Denies redness, rashes, lesions or ulcercations.  Neurological: Denies dizziness, difficulty with memory, difficulty with speech or problems with balance and coordination.  Psych: Depression, SI/HI.  No other specific complaints in a complete review of systems (except as listed in HPI above).  Objective:  PE:   There were no vitals taken for this visit. Wt Readings from Last 3 Encounters:   08/17/21 85 lb 9.6 oz (38.8 kg)  06/01/21 85 lb 1.6 oz (38.6 kg)  12/08/20 89 lb (40.4 kg)    Neurological: Alert and oriented.  Psychiatric: Mood and affect normal. Behavior is normal. Judgment and thought content normal.    BMET    Component Value Date/Time   NA 136 12/12/2020 1022   NA 131 (L) 11/04/2013 0908   K 4.4 12/12/2020 1022   K 4.0 11/04/2013 0908   CL 101 12/12/2020 1022   CL 97 (L) 11/04/2013 0908   CO2 20 12/12/2020 1022   CO2 31 11/04/2013 0908   GLUCOSE 102 (H) 12/12/2020 1022   GLUCOSE 133 (H) 01/14/2018 2059   GLUCOSE 137 (H) 11/04/2013 0908   BUN 14 12/12/2020 1022   BUN 12 11/04/2013 0908   CREATININE 1.05 (H) 12/12/2020 1022   CREATININE 0.92 05/20/2017 1011   CALCIUM 9.4 12/12/2020 1022   CALCIUM 8.8  11/04/2013 0908   GFRNONAA 38 (L) 03/24/2020 1552   GFRNONAA 63 05/20/2017 1011   GFRAA 44 (L) 03/24/2020 1552   GFRAA 73 05/20/2017 1011    Lipid Panel     Component Value Date/Time   CHOL 142 12/12/2020 1022   TRIG 103 12/12/2020 1022   HDL 61 12/12/2020 1022   CHOLHDL 2.3 12/12/2020 1022   LDLCALC 62 12/12/2020 1022    CBC    Component Value Date/Time   WBC 6.3 12/12/2020 1022   WBC 8.4 01/14/2018 2323   RBC 3.81 12/12/2020 1022   RBC 3.83 01/14/2018 2323   HGB 12.4 12/12/2020 1022   HCT 37.3 12/12/2020 1022   PLT 225 12/12/2020 1022   MCV 98 (H) 12/12/2020 1022   MCV 99 11/03/2013 1116   MCH 32.5 12/12/2020 1022   MCH 33.0 01/14/2018 2323   MCHC 33.2 12/12/2020 1022   MCHC 34.2 01/14/2018 2323   RDW 12.5 12/12/2020 1022   RDW 13.3 11/03/2013 1116   LYMPHSABS 2.3 12/12/2020 1022   MONOABS 0.7 01/14/2018 2323   EOSABS 0.2 12/12/2020 1022   BASOSABS 0.1 12/12/2020 1022    Hgb A1C Lab Results  Component Value Date   HGBA1C 5.5 09/26/2017      Assessment and Plan:   Medicare Annual Wellness Visit:  Diet: Heart healthy  Physical activity: Sedentary Depression/mood screen: Positive  Hearing: Intact to whispered  voice Visual acuity: Grossly normal, performs annual eye exam  ADLs: Capable Fall risk: None Home safety: Good Cognitive evaluation: Intact to orientation, naming, recall and repetition EOL planning: No Adv directives, mail out the [facility] directory   Preventative Medicine:  Ms. Ballow , Thank you for taking time to come for your Medicare Wellness Visit. I appreciate your ongoing commitment to your health goals. Please review the following plan we discussed and let me know if I can assist you in the future.   These are the goals we discussed:  Goals      Exercise 150 minutes per week (moderate activity)     Quit Smoking     Recommend to continue efforts to reduce smoking habits until no longer smoking.         This is a list of the screening recommended for you and due dates:  Health Maintenance  Topic Date Due   Zoster (Shingles) Vaccine (1 of 2) Never done   Mammogram  03/04/2019   Colon Cancer Screening  04/08/2019   DEXA scan (bone density measurement)  03/03/2020   COVID-19 Vaccine (4 - Booster for Moderna series) 09/01/2020   Flu Shot  02/26/2021   Tetanus Vaccine  03/21/2021   Pneumonia Vaccine  Completed   Hepatitis C Screening: USPSTF Recommendation to screen - Ages 18-79 yo.  Completed   HPV Vaccine  Aged Out     High Dose Flu: 04/30/2021    COVID Vaccines: Moderna 10/09/2019, 09/11/2019    Bilvalent Booster: 04/30/2021    Prevnar: 03/11/2014    Pneumococcal Vaccine: 05/23/2016, 06/26/2006    TDAP: 03/22/2011  Last AWV   Ms. Derek Jack , Thank you for taking time to come for your Medicare Wellness Visit. I appreciate your ongoing commitment to your health goals. Please review the following plan we discussed and let me know if I can assist you in the future.   These are the goals we discussed:  Goals      Exercise 150 minutes per week (moderate activity)     Quit Smoking  Recommend to continue efforts to reduce smoking habits until no longer smoking.          This is a list of the screening recommended for you and due dates:     Next appointment: Follow up in 1 yr for an Annual Medicare Wellness. The pt is scheduled for an appointment with Dr. Parks Ranger on 09/10/2021.  Wilson Singer, CMA

## 2021-09-10 ENCOUNTER — Other Ambulatory Visit: Payer: Self-pay | Admitting: Family Medicine

## 2021-09-10 ENCOUNTER — Other Ambulatory Visit: Payer: Self-pay

## 2021-09-10 ENCOUNTER — Encounter: Payer: Self-pay | Admitting: Family Medicine

## 2021-09-10 ENCOUNTER — Ambulatory Visit (INDEPENDENT_AMBULATORY_CARE_PROVIDER_SITE_OTHER): Payer: Medicare Other | Admitting: Family Medicine

## 2021-09-10 VITALS — BP 95/47 | HR 71 | Ht 62.5 in | Wt 81.2 lb

## 2021-09-10 DIAGNOSIS — E7800 Pure hypercholesterolemia, unspecified: Secondary | ICD-10-CM

## 2021-09-10 DIAGNOSIS — F5104 Psychophysiologic insomnia: Secondary | ICD-10-CM

## 2021-09-10 DIAGNOSIS — F411 Generalized anxiety disorder: Secondary | ICD-10-CM | POA: Diagnosis not present

## 2021-09-10 DIAGNOSIS — M545 Low back pain, unspecified: Secondary | ICD-10-CM | POA: Diagnosis not present

## 2021-09-10 DIAGNOSIS — F330 Major depressive disorder, recurrent, mild: Secondary | ICD-10-CM

## 2021-09-10 DIAGNOSIS — M4699 Unspecified inflammatory spondylopathy, multiple sites in spine: Secondary | ICD-10-CM | POA: Diagnosis not present

## 2021-09-10 DIAGNOSIS — G8929 Other chronic pain: Secondary | ICD-10-CM

## 2021-09-10 DIAGNOSIS — F410 Panic disorder [episodic paroxysmal anxiety] without agoraphobia: Secondary | ICD-10-CM

## 2021-09-10 MED ORDER — TRAMADOL HCL 50 MG PO TABS: 50.0000 mg | ORAL_TABLET | Freq: Four times a day (QID) | ORAL | 0 refills | Status: DC | PRN

## 2021-09-10 MED ORDER — MELOXICAM 7.5 MG PO TABS: 7.5000 mg | ORAL_TABLET | Freq: Every day | ORAL | 1 refills | Status: DC | PRN

## 2021-09-10 MED ORDER — ALPRAZOLAM 0.5 MG PO TABS: 0.5000 mg | ORAL_TABLET | Freq: Three times a day (TID) | ORAL | 2 refills | Status: DC | PRN

## 2021-09-10 MED ORDER — GABAPENTIN 600 MG PO TABS: 600.0000 mg | ORAL_TABLET | Freq: Three times a day (TID) | ORAL | 1 refills | Status: DC

## 2021-09-10 MED ORDER — SIMVASTATIN 10 MG PO TABS: ORAL_TABLET | ORAL | 3 refills | Status: DC

## 2021-09-10 NOTE — Progress Notes (Signed)
Subjective:    Patient ID: [name] [name], female    DOB: [birth year], 76 y.o.   MRN: [id number]  [name] [name] is a 76 y.o. female presenting on 09/10/2021 for Back Pain and Anxiety   HPI  Chronic Back Pain / Inflammatory Spondylopathy Scoliosis Osteoporosis Followed by [facility] Dr Kayleen Memos Hx Shoulder Fracture / Kyphoplasty failed.  Taking Hydrocodone-Acetaminophen PRN q 6 hr PRN up to 4 times daily, for past 6 years since broke shoulder. Recently was placed on Gabapentin 646m PRN  Most of her pain is mostly Lower back, she was dx with Spondylolesthesis L5 - S1 Taking Meloxicam 7.552mPRN she was given 30 days and it helped but it ran out and she did not go back to orthopedics  All medications seem to help her pain, but still describes chronic pain all the time She admits doing a lot of housework that she continues to do, housework, cleaning, has a small dog and feeds/fills water etc worse with bending.    Anxiety Insomnia Chronic problem with anxiety, taking Xanax 0.58m72mID PRN for past 20+ years. Husband passed 9 years ago. She also lost her parents and her sister. Overwhelmed more recently with life changes with loss of family members. Admits caffeine making her more anxious.   [facility] 09/06/2021 12/08/2020  Decreased Interest _0 Down, Depressed, Hopeless _1 PHQ - 2 Score _2 Altered sleeping _3 Tired, decreased energy _4 Change in appetite 2 0 2  Feeling bad or failure about yourself  _5 Trouble concentrating _6 Moving slowly or fidgety/restless 0 2 0  Suicidal thoughts 1 1 0  PHQ-9 Score _7 Difficult doing work/chores Extremely dIfficult Very difficult Extremely dIfficult  Some recent data might be hidden    Social History   Tobacco Use   Smoking status: Every Day    Packs/day: 0.50    Years: 53.00    Pack years: 26.50    Types: Cigarettes   Smokeless tobacco: Never  Vaping Use   Vaping  Use: Never used  Substance Use Topics   Alcohol use: Not Currently    Alcohol/week: 2.0 standard drinks    Types: 2 Glasses of wine per week   Drug use: Not Currently    Types: Marijuana    Comment: in the past    Review of Systems Per HPI unless specifically indicated above     Objective:    BP (!) 95/47    Pulse 71    Ht 5' 2.5" (1.588 m)    Wt 81 lb 3.2 oz (36.8 kg)    SpO2 97%    BMI 14.61 kg/m   Wt Readings from Last 3 Encounters:  09/10/21 81 lb 3.2 oz (36.8 kg)  08/17/21 85 lb 9.6 oz (38.8 kg)  06/01/21 85 lb 1.6 oz (38.6 kg)    Physical Exam Vitals and nursing note reviewed.  Constitutional:      General: She is not in acute distress.    Appearance: Normal appearance. She is well-developed. She is not diaphoretic.     Comments: Well-appearing, comfortable, cooperative  HENT:     Head: Normocephalic and atraumatic.  Eyes:     General:        Right eye: No discharge.        Left eye: No discharge.     Conjunctiva/sclera: Conjunctivae normal.  Cardiovascular:  Rate and Rhythm: Normal rate.  Pulmonary:     Effort: Pulmonary effort is normal.  Skin:    General: Skin is warm and dry.     Findings: No erythema or rash.  Neurological:     Mental Status: She is alert and oriented to person, place, and time.  Psychiatric:        Mood and Affect: Mood normal.        Behavior: Behavior normal.        Thought Content: Thought content normal.     Comments: Well groomed, good eye contact, normal speech and thoughts   Results for orders placed or performed in visit on 12/08/20  CBC with Differential/Platelet  Result Value Ref Range   WBC 6.3 3.4 - 10.8 x10E3/uL   RBC 3.81 3.77 - 5.28 x10E6/uL   Hemoglobin 12.4 11.1 - 15.9 g/dL   Hematocrit 37.3 34.0 - 46.6 %   MCV 98 (H) 79 - 97 fL   MCH 32.5 26.6 - 33.0 pg   MCHC 33.2 31.5 - 35.7 g/dL   RDW 12.5 11.7 - 15.4 %   Platelets 225 150 - 450 x10E3/uL   Neutrophils 51 Not Estab. %   Lymphs 36 Not Estab. %    Monocytes 8 Not Estab. %   Eos 4 Not Estab. %   Basos 1 Not Estab. %   Neutrophils Absolute 3.1 1.4 - 7.0 x10E3/uL   Lymphocytes Absolute 2.3 0.7 - 3.1 x10E3/uL   Monocytes Absolute 0.5 0.1 - 0.9 x10E3/uL   EOS (ABSOLUTE) 0.2 0.0 - 0.4 x10E3/uL   Basophils Absolute 0.1 0.0 - 0.2 x10E3/uL   Immature Granulocytes 0 Not Estab. %   Immature Grans (Abs) 0.0 0.0 - 0.1 x10E3/uL  Comprehensive metabolic panel  Result Value Ref Range   Glucose 102 (H) 65 - 99 mg/dL   BUN 14 8 - 27 mg/dL   Creatinine, Ser 1.05 (H) 0.57 - 1.00 mg/dL   eGFR 56 (L) >59 mL/min/1.73   BUN/Creatinine Ratio 13 12 - 28   Sodium 136 134 - 144 mmol/L   Potassium 4.4 3.5 - 5.2 mmol/L   Chloride 101 96 - 106 mmol/L   CO2 20 20 - 29 mmol/L   Calcium 9.4 8.7 - 10.3 mg/dL   Total Protein 7.2 6.0 - 8.5 g/dL   Albumin 4.6 3.7 - 4.7 g/dL   Globulin, Total 2.6 1.5 - 4.5 g/dL   Albumin/Globulin Ratio 1.8 1.2 - 2.2   Bilirubin Total 0.3 0.0 - 1.2 mg/dL   Alkaline Phosphatase 110 44 - 121 IU/L   AST 24 0 - 40 IU/L   ALT 13 0 - 32 IU/L  VITAMIN D 25 Hydroxy (Vit-D Deficiency, Fractures)  Result Value Ref Range   Vit D, 25-Hydroxy 27.6 (L) 30.0 - 100.0 ng/mL  Lipid panel  Result Value Ref Range   Cholesterol, Total 142 100 - 199 mg/dL   Triglycerides 103 0 - 149 mg/dL   HDL 61 >39 mg/dL   VLDL Cholesterol Cal 19 5 - 40 mg/dL   LDL Chol Calc (NIH) 62 0 - 99 mg/dL   Chol/HDL Ratio 2.3 0.0 - 4.4 ratio  TSH  Result Value Ref Range   TSH 1.240 0.450 - 4.500 uIU/mL      Assessment & Plan:   Problem List Items Addressed This Visit     Psychophysiological insomnia   Major depressive disorder, recurrent, mild (HCC)   Relevant Medications   ALPRAZolam (XANAX) 0.5 MG tablet   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[place]) - Primary   Relevant Medications   traMADol (ULTRAM) 50 MG tablet   meloxicam (MOBIC) 7.5 MG tablet   gabapentin (NEURONTIN) 600 MG tablet   Other Relevant Orders   Ambulatory referral  to Pain Clinic   GAD (generalized anxiety disorder)   Relevant Medications   ALPRAZolam (XANAX) 0.5 MG tablet   Chronic bilateral low back pain without sciatica   Relevant Medications   traMADol (ULTRAM) 50 MG tablet   meloxicam (MOBIC) 7.5 MG tablet   gabapentin (NEURONTIN) 600 MG tablet   Other Relevant Orders   Ambulatory referral to Pain Clinic   Other Visit Diagnoses     Generalized anxiety disorder with panic attacks       Relevant Medications   ALPRAZolam (XANAX) 0.5 MG tablet       Referral sent to [facility] - stay tuned for apt. If not heard in about 1-2 weeks, call them to work on scheduling.  Goal for them is to pursue procedures and injections interventional treatments.  Refilled all meds Gabapentin, Meloxican  Stop Hydrocodone Switch to Tramadol instead, same dosing - if after 1 week not helping enough let me know we can return to Hydrocodone as needed  Refill Alprazolam - add extra 3rd dose only as needed, caution with withdrawal. Discussed chronic use of BDZ   Orders Placed This Encounter  Procedures   Ambulatory referral to Pain Clinic    Referral Priority:   Routine    Referral Type:   Consultation    Referral Reason:   Specialty Services Required    Requested Specialty:   Pain Medicine    Number of Visits Requested:   1     Meds ordered this encounter  Medications   traMADol (ULTRAM) 50 MG tablet    Sig: Take 1 tablet (50 mg total) by mouth every 6 (six) hours as needed for moderate pain or severe pain.    Dispense:  120 tablet    Refill:  0   meloxicam (MOBIC) 7.5 MG tablet    Sig: Take 1 tablet (7.5 mg total) by mouth daily as needed for pain (arthritis).    Dispense:  90 tablet    Refill:  1   gabapentin (NEURONTIN) 600 MG tablet    Sig: Take 1 tablet (600 mg total) by mouth 3 (three) times daily.    Dispense:  270 tablet    Refill:  1   ALPRAZolam (XANAX) 0.5 MG tablet    Sig: Take 1 tablet (0.5 mg total) by mouth 3 (three) times  daily as needed for anxiety.    Dispense:  90 tablet    Refill:  2      Follow up plan: Return in about 3 months (around 12/08/2021) for 3 month follow-up Pain updates, Anxiety Med refills.   Nobie Putnam, DO [facility] 09/10/2021, 2:10 PM

## 2021-09-10 NOTE — Patient Instructions (Addendum)
Thank you for coming to the office today.  Referral sent - stay tuned for apt. If not heard in about 1-2 weeks, call them to work on scheduling.  Goal for them is to pursue procedures and injections interventional treatments.  [facility] Address: [address], [place], [place] [address] Phone: [phone]  Refilled all meds Gabapentin, Meloxican  Stop Hydrocodone Switch to Tramadol instead, same dosing - if after 1 week not helping enough let me know we can return to Hydrocodone as needed  Refill Alprazolam - add extra 3rd dose only as needed, caution with withdrawal.  Please schedule a Follow-up Appointment to: Return in about 3 months (around 12/08/2021) for 3 month follow-up Pain updates, Anxiety Med refills.  If you have any other questions or concerns, please feel free to call the office or send a message through [facility]. You may also schedule an earlier appointment if necessary.  Additionally, you may be receiving a survey about your experience at our office within a few days to 1 week by e-mail or mail. We value your feedback.  Nobie Putnam, DO [place]

## 2021-09-18 ENCOUNTER — Encounter: Payer: Self-pay | Admitting: Ophthalmology

## 2021-09-20 NOTE — Discharge Instructions (Signed)

## 2021-09-21 ENCOUNTER — Telehealth: Payer: Self-pay | Admitting: Acute Care

## 2021-09-21 NOTE — Telephone Encounter (Signed)
Attempted to reach pt to schedule LDCT.  LVMM.

## 2021-09-24 ENCOUNTER — Encounter: Payer: Self-pay | Admitting: Ophthalmology

## 2021-09-24 ENCOUNTER — Ambulatory Visit: Payer: Medicare Other | Admitting: Anesthesiology

## 2021-09-24 ENCOUNTER — Encounter
Admission: RE | Disposition: A | Discharge status: Home / Self Care | Payer: Self-pay | Source: Home / Self Care | Attending: Ophthalmology

## 2021-09-24 ENCOUNTER — Ambulatory Visit
Admission: RE | Admit: 2021-09-24 | Discharge: 2021-09-24 | Disposition: A | Discharge status: Home / Self Care | Payer: Medicare Other | Attending: Ophthalmology | Admitting: Ophthalmology

## 2021-09-24 ENCOUNTER — Other Ambulatory Visit: Payer: Self-pay

## 2021-09-24 DIAGNOSIS — I10 Essential (primary) hypertension: Secondary | ICD-10-CM | POA: Diagnosis not present

## 2021-09-24 DIAGNOSIS — J449 Chronic obstructive pulmonary disease, unspecified: Secondary | ICD-10-CM | POA: Diagnosis not present

## 2021-09-24 DIAGNOSIS — F32A Depression, unspecified: Secondary | ICD-10-CM | POA: Insufficient documentation

## 2021-09-24 DIAGNOSIS — I2510 Atherosclerotic heart disease of native coronary artery without angina pectoris: Secondary | ICD-10-CM | POA: Diagnosis not present

## 2021-09-24 DIAGNOSIS — F17210 Nicotine dependence, cigarettes, uncomplicated: Secondary | ICD-10-CM | POA: Insufficient documentation

## 2021-09-24 DIAGNOSIS — F419 Anxiety disorder, unspecified: Secondary | ICD-10-CM | POA: Insufficient documentation

## 2021-09-24 DIAGNOSIS — K219 Gastro-esophageal reflux disease without esophagitis: Secondary | ICD-10-CM | POA: Insufficient documentation

## 2021-09-24 DIAGNOSIS — H25812 Combined forms of age-related cataract, left eye: Secondary | ICD-10-CM | POA: Diagnosis not present

## 2021-09-24 DIAGNOSIS — Z79899 Other long term (current) drug therapy: Secondary | ICD-10-CM | POA: Insufficient documentation

## 2021-09-24 DIAGNOSIS — H2512 Age-related nuclear cataract, left eye: Secondary | ICD-10-CM | POA: Insufficient documentation

## 2021-09-24 HISTORY — DX: Presence of dental prosthetic device (complete) (partial): Z97.2

## 2021-09-24 HISTORY — DX: Spondylolisthesis, multiple sites in spine: M43.19

## 2021-09-24 HISTORY — PX: CATARACT EXTRACTION W/PHACO: SHX586

## 2021-09-24 HISTORY — DX: Other chronic pain: G89.29

## 2021-09-24 SURGERY — PHACOEMULSIFICATION, CATARACT, WITH IOL INSERTION
Anesthesia: Monitor Anesthesia Care | Site: Eye | Laterality: Left

## 2021-09-24 MED ORDER — ACETAMINOPHEN 500 MG PO TABS: 1000.0000 mg | ORAL_TABLET | Freq: Once | ORAL | Status: DC | PRN

## 2021-09-24 MED ORDER — MIDAZOLAM HCL 2 MG/2ML IJ SOLN
INTRAMUSCULAR | Status: DC | PRN
Start: 2021-09-24 — End: 2021-09-24
  Administered 2021-09-24 (×2): .5 mg via INTRAVENOUS

## 2021-09-24 MED ORDER — FENTANYL CITRATE (PF) 100 MCG/2ML IJ SOLN
INTRAMUSCULAR | Status: DC | PRN
  Administered 2021-09-24 (×2): 25 ug via INTRAVENOUS

## 2021-09-24 MED ORDER — ACETAMINOPHEN 160 MG/5ML PO SOLN: 975.0000 mg | Freq: Once | ORAL | Status: DC | PRN

## 2021-09-24 MED ORDER — SIGHTPATH DOSE#1 BSS IO SOLN
INTRAOCULAR | Status: DC | PRN
  Administered 2021-09-24: 15 mL

## 2021-09-24 MED ORDER — SIGHTPATH DOSE#1 SODIUM HYALURONATE 23 MG/ML IO SOLUTION
PREFILLED_SYRINGE | INTRAOCULAR | Status: DC | PRN
  Administered 2021-09-24: 0.6 mL via INTRAOCULAR

## 2021-09-24 MED ORDER — MOXIFLOXACIN HCL 0.5 % OP SOLN
OPHTHALMIC | Status: DC | PRN
  Administered 2021-09-24: 0.2 mL via OPHTHALMIC

## 2021-09-24 MED ORDER — LACTATED RINGERS IV SOLN: INTRAVENOUS | Status: DC

## 2021-09-24 MED ORDER — ARMC OPHTHALMIC DILATING DROPS
1.0000 "application " | OPHTHALMIC | Status: AC | PRN
  Administered 2021-09-24 (×3): 1 via OPHTHALMIC

## 2021-09-24 MED ORDER — TETRACAINE HCL 0.5 % OP SOLN
1.0000 [drp] | OPHTHALMIC | Status: DC | PRN
  Administered 2021-09-24 (×3): 1 [drp] via OPHTHALMIC

## 2021-09-24 MED ORDER — LIDOCAINE HCL (PF) 2 % IJ SOLN
INTRAMUSCULAR | Status: DC | PRN
  Administered 2021-09-24: 1 mL via INTRAOCULAR

## 2021-09-24 MED ORDER — SIGHTPATH DOSE#1 SODIUM HYALURONATE 10 MG/ML IO SOLUTION
PREFILLED_SYRINGE | INTRAOCULAR | Status: DC | PRN
  Administered 2021-09-24: 0.85 mL via INTRAOCULAR

## 2021-09-24 MED ORDER — ONDANSETRON HCL 4 MG/2ML IJ SOLN: 4.0000 mg | Freq: Once | INTRAMUSCULAR | Status: DC | PRN

## 2021-09-24 MED ORDER — SIGHTPATH DOSE#1 BSS IO SOLN
INTRAOCULAR | Status: DC | PRN
  Administered 2021-09-24: 65 mL via OPHTHALMIC

## 2021-09-24 SURGICAL SUPPLY — 20 items
CANNULA ANT/CHMB 27G (MISCELLANEOUS) IMPLANT
CANNULA ANT/CHMB 27GA (MISCELLANEOUS) IMPLANT
CATARACT SUITE SIGHTPATH (MISCELLANEOUS) ×2 IMPLANT
DISSECTOR HYDRO NUCLEUS 50X22 (MISCELLANEOUS) ×2 IMPLANT
FEE CATARACT SUITE SIGHTPATH (MISCELLANEOUS) ×1 IMPLANT
GLOVE SURG GAMMEX PI TX LF 7.5 (GLOVE) ×2 IMPLANT
GLOVE SURG SYN 8.5  E (GLOVE) ×1
GLOVE SURG SYN 8.5 E (GLOVE) ×1 IMPLANT
GLOVE SURG SYN 8.5 PF PI (GLOVE) ×1 IMPLANT
LENS IOL TECNIS EYHANCE 24.0 (Intraocular Lens) ×1 IMPLANT
NDL FILTER BLUNT 18X1 1/2 (NEEDLE) ×1 IMPLANT
NEEDLE FILTER BLUNT 18X 1/2SAF (NEEDLE) ×1
NEEDLE FILTER BLUNT 18X1 1/2 (NEEDLE) ×1 IMPLANT
PACK VIT ANT 23G (MISCELLANEOUS) IMPLANT
RING MALYGIN (MISCELLANEOUS) IMPLANT
SUT ETHILON 10-0 CS-B-6CS-B-6 (SUTURE)
SUTURE EHLN 10-0 CS-B-6CS-B-6 (SUTURE) IMPLANT
SYR 3ML LL SCALE MARK (SYRINGE) ×2 IMPLANT
SYR 5ML LL (SYRINGE) ×2 IMPLANT
WATER STERILE IRR 250ML POUR (IV SOLUTION) ×2 IMPLANT

## 2021-09-24 NOTE — H&P (Signed)
[facility]   Primary Care Physician:  Olin Hauser, DO Ophthalmologist: Dr. Benay Pillow  Pre-Procedure History & Physical: HPI:  [name] [name] is a 76 y.o. female here for cataract surgery.   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  Some speech deficit.   Wears dentures    full upper and lower    Past Surgical History:  Procedure Laterality Date   ABDOMINAL HYSTERECTOMY  1982   BACK SURGERY     COLONOSCOPY WITH PROPOFOL N/A 04/07/2018   Procedure: COLONOSCOPY WITH PROPOFOL;  Surgeon: [name], MD;  Location: [facility] ENDOSCOPY;  Service: Endoscopy;  Laterality: N/A;   KYPHOPLASTY N/A 10/23/2017   Procedure: [id number];  Surgeon: Hessie Knows, MD;  Location: [facility] ORS;  Service: Orthopedics;  Laterality: N/A;    Prior to Admission medications   Medication Sig Start Date End Date Taking? Authorizing Provider  albuterol (VENTOLIN HFA) 108 (90 Base) MCG/ACT inhaler Inhale 2 puffs into the lungs every 4 (four) hours as needed for wheezing or shortness of breath. 02/21/20  Yes Burnette, Clearnce Sorrel, PA-C  ALPRAZolam Duanne Moron) 0.5 MG tablet Take 1 tablet (0.5 mg total) by mouth 3 (three) times daily as needed for anxiety. 09/10/21  Yes Karamalegos, Devonne Doughty, DO  amLODipine (NORVASC) 5 MG tablet TAKE 1 TABLET(5 MG) BY MOUTH DAILY 04/23/21  Yes Jerrol Banana., MD  aspirin 81 MG tablet Take 81 mg by mouth daily.   Yes [provider]  Calcium 600-200 MG-UNIT tablet Take 1 tablet by mouth daily.   Yes [provider]  cholecalciferol (VITAMIN D) 25 MCG (1000 UNIT) tablet Take 1,000 Units by mouth daily.   Yes [provider]   citalopram (CELEXA) 40 MG tablet TAKE 1 TABLET(40 MG) BY MOUTH DAILY 05/16/21  Yes Birdie Sons, MD  docusate sodium (COLACE) 100 MG capsule Take 100 mg by mouth 2 (two) times daily as needed for mild constipation.   Yes [provider]  furosemide (LASIX) 20 MG tablet Take 20 mg by mouth daily as needed for fluid.    Yes [provider]  gabapentin (NEURONTIN) 600 MG tablet Take 1 tablet (600 mg total) by mouth 3 (three) times daily. 09/10/21  Yes Karamalegos, Devonne Doughty, DO  lisinopril (ZESTRIL) 40 MG tablet TAKE 1 TABLET(40 MG) BY MOUTH DAILY 08/06/21  Yes Tally Joe T, FNP  meloxicam (MOBIC) 7.5 MG tablet Take 1 tablet (7.5 mg total) by mouth daily as needed for pain (arthritis). 09/10/21  Yes Karamalegos, Devonne Doughty, DO  metoprolol succinate (TOPROL-XL) 25 MG 24 hr tablet TAKE 1 TABLET(25 MG) BY MOUTH DAILY 05/03/21  Yes Jerrol Banana., MD  pantoprazole (PROTONIX) 20 MG tablet TAKE 1 TABLET(20 MG) BY MOUTH DAILY 09/29/20  Yes Fenton Malling M, PA-C  potassium chloride SA (K-DUR,KLOR-CON) 20 MEQ tablet Take 1 tablet (20 mEq total) by mouth daily. 10/01/17  Yes Mar Daring, PA-C  Propylene Glycol (SYSTANE COMPLETE OP) Apply to eye.   Yes [provider]  simvastatin (ZOCOR) 10 MG tablet TAKE 1 TABLET(10 MG) BY MOUTH DAILY 09/10/21  Yes Parks Ranger, Devonne Doughty, DO  Tiotropium Bromide Monohydrate (SPIRIVA RESPIMAT) 1.25 MCG/ACT AERS Inhale 1 puff into the lungs daily. 02/21/20  Yes Mar Daring, PA-C  traMADol (ULTRAM) 50 MG tablet Take 1 tablet (50 mg total) by mouth every 6 (six) hours as needed for moderate pain or severe pain. 09/10/21  Yes Olin Hauser, DO    Allergies as of 08/14/2021   (No Known Allergies)    Family History  Problem Relation Age of Onset   Leukemia Mother    Aneurysm Father    Diabetes Father    Heart disease Father    Liver cancer Sister    Cancer Sister    Lung cancer Sister        metastatic    Diabetes Maternal Grandmother    Diabetes Paternal Grandmother     Social History   Socioeconomic History   Marital status: Widowed    Spouse name: Not on file   Number of children: 1   Years of education: Not on file   Highest education level: Some college, no degree  Occupational History    Employer: RETIRED  Tobacco Use   Smoking status: Every Day    Packs/day: 0.50    Years: 53.00    Pack years: 26.50    Types: Cigarettes   Smokeless tobacco: Never   Tobacco comments:    Since age 9  Vaping Use   Vaping Use: Never used  Substance and Sexual Activity   Alcohol use: Not Currently    Alcohol/week: 2.0 standard drinks    Types: 2 Glasses of wine per week   Drug use: Not Currently    Types: Marijuana    Comment: in the past   Sexual activity: Not on file  Other Topics Concern   Not on file  Social History Narrative   Not on file   Social Determinants of Health   Financial Resource Strain: Low Risk    Difficulty of Paying Living Expenses: Not hard at all  Food Insecurity: No Food Insecurity   Worried About Charity fundraiser in the Last Year: Never true   Ran Out of Food in the Last Year: Never true  Transportation Needs: No Transportation Needs   Lack of Transportation (Medical): No   Lack of Transportation (Non-Medical): No  Physical Activity: Inactive   Days of Exercise per Week: 0 days   Minutes of Exercise per Session: 0 min  Stress: Stress Concern Present   Feeling of Stress : Rather much  Social Connections: Socially Isolated   Frequency of Communication with Friends and Family: More than three times a week   Frequency of Social Gatherings with Friends and Family: Never   Attends Religious Services: Never   Marine scientist or Organizations: No   Attends Archivist Meetings: Never   Marital Status: Widowed  Human resources officer Violence: Not on file    Review of Systems: See HPI, otherwise negative ROS  Physical Exam: BP (!) 161/77     Pulse 64    Temp (!) 97.5 F (36.4 C)    Ht 5\' 4"  (1.626 m)    Wt 36.7 kg    SpO2 96%    BMI 13.90 kg/m  General:   Alert, cooperative in NAD Head:  Normocephalic and atraumatic. Respiratory:  Normal work of breathing. Cardiovascular:  RRR  Impression/Plan: [name] [name] is here for cataract surgery.  Risks, benefits, limitations, and alternatives regarding cataract surgery have been reviewed with the patient.  Questions have been answered.  All parties agreeable.   Benay Pillow, MD  09/24/2021, 10:55 AM

## 2021-09-24 NOTE — Transfer of Care (Signed)
Immediate Anesthesia Transfer of Care Note  Patient: [name] [name]  Procedure(s) Performed: CATARACT EXTRACTION PHACO AND INTRAOCULAR LENS PLACEMENT (IOC) LEFT (Left: Eye)  Patient Location: PACU  Anesthesia Type: MAC  Level of Consciousness: awake, alert  and patient cooperative  Airway and Oxygen Therapy: Patient Spontanous Breathing and Patient connected to supplemental oxygen  Post-op Assessment: Post-op Vital signs reviewed, Patient's Cardiovascular Status Stable, Respiratory Function Stable, Patent Airway and No signs of Nausea or vomiting  Post-op Vital Signs: Reviewed and stable  Complications: No notable events documented.

## 2021-09-24 NOTE — Anesthesia Postprocedure Evaluation (Signed)
Anesthesia Post Note  Patient: [name] [name]  Procedure(s) Performed: CATARACT EXTRACTION PHACO AND INTRAOCULAR LENS PLACEMENT (IOC) LEFT (Left: Eye)     Patient location during evaluation: PACU Anesthesia Type: MAC Level of consciousness: awake and alert Pain management: pain level controlled Vital Signs Assessment: post-procedure vital signs reviewed and stable Respiratory status: spontaneous breathing, nonlabored ventilation and respiratory function stable Cardiovascular status: stable and blood pressure returned to baseline Postop Assessment: no apparent nausea or vomiting Anesthetic complications: no   No notable events documented.  April Manson

## 2021-09-24 NOTE — Op Note (Signed)
OPERATIVE NOTE  [name] [name] [id number] 09/24/2021   PREOPERATIVE DIAGNOSIS:  Nuclear sclerotic cataract left eye.  H25.12   POSTOPERATIVE DIAGNOSIS:    Nuclear sclerotic cataract left eye.     PROCEDURE:  Phacoemusification with posterior chamber intraocular lens placement of the left eye   LENS:   Implant Name Type Inv. Item Serial No. Manufacturer Lot No. LRB No. Used Action  LENS IOL TECNIS EYHANCE 24.0 - [id number] Intraocular Lens LENS IOL TECNIS EYHANCE 24.0 [id number] [employer]  Left 1 Implanted      Procedure(s) with comments: CATARACT EXTRACTION PHACO AND INTRAOCULAR LENS PLACEMENT (IOC) LEFT (Left) - 3.80 0:31.7  DIB00 +24.0   ULTRASOUND TIME: 0 minutes 31 seconds.  CDE 3.80   SURGEON:  Benay Pillow, MD, MPH   ANESTHESIA:  Topical with tetracaine drops augmented with 1% preservative-free intracameral lidocaine.  ESTIMATED BLOOD LOSS: <1 mL   COMPLICATIONS:  None.   DESCRIPTION OF PROCEDURE:  The patient was identified in the holding room and transported to the operating room and placed in the supine position under the operating microscope.  The left eye was identified as the operative eye and it was prepped and draped in the usual sterile ophthalmic fashion.   A 1.0 millimeter clear-corneal paracentesis was made at the 5:00 position. 0.5 ml of preservative-free 1% lidocaine with epinephrine was injected into the anterior chamber.  The anterior chamber was filled with Healon 5 viscoelastic.  A 2.4 millimeter keratome was used to make a near-clear corneal incision at the 2:00 position.  A curvilinear capsulorrhexis was made with a cystotome and capsulorrhexis forceps.  Balanced salt solution was used to hydrodissect and hydrodelineate the nucleus.   Phacoemulsification was then used in stop and chop fashion to remove the lens nucleus and epinucleus.  The remaining cortex was then removed using the irrigation and aspiration handpiece. Healon was then placed into the  capsular bag to distend it for lens placement.  A lens was then injected into the capsular bag.  The remaining viscoelastic was aspirated.   Wounds were hydrated with balanced salt solution.  The anterior chamber was inflated to a physiologic pressure with balanced salt solution.  Intracameral vigamox 0.1 mL undiltued was injected into the eye and a drop placed onto the ocular surface.  No wound leaks were noted.  The patient was taken to the recovery room in stable condition without complications of anesthesia or surgery  Benay Pillow 09/24/2021, 11:21 AM

## 2021-09-24 NOTE — Anesthesia Preprocedure Evaluation (Addendum)
Anesthesia Evaluation  Patient identified by MRN, date of birth, ID band Patient awake    Reviewed: Allergy & Precautions, H&P , NPO status , Patient's Chart, lab work & pertinent test results, reviewed documented beta blocker date and time   Airway Mallampati: II  TM Distance: >3 FB Neck ROM: full    Dental no notable dental hx.    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, occasional diuretic use   Neuro/Psych Anxiety Depression TIA (2016)negative neurological ROS  negative psych ROS   GI/Hepatic negative GI ROS, Neg liver ROS, PUD, GERD  ,  Endo/Other  negative endocrine ROS  Renal/GU negative Renal ROS  negative genitourinary   Musculoskeletal   Abdominal   Peds  Hematology negative hematology ROS (+)   Anesthesia Other Findings   Reproductive/Obstetrics negative OB ROS                            Anesthesia Physical Anesthesia Plan  ASA: 3  Anesthesia Plan: MAC   Post-op Pain Management:    Induction:   PONV Risk Score and Plan: 1 and TIVA, Treatment may vary due to age or medical condition and Midazolam  Airway Management Planned:   Additional Equipment:   Intra-op Plan:   Post-operative Plan:   Informed Consent: I have reviewed the patients History and Physical, chart, labs and discussed the procedure including the risks, benefits and alternatives for the proposed anesthesia with the patient or authorized representative who has indicated his/her understanding and acceptance.     Dental Advisory Given  Plan Discussed with: CRNA  Anesthesia Plan Comments:         Anesthesia Quick Evaluation

## 2021-09-25 ENCOUNTER — Encounter: Payer: Self-pay | Admitting: Ophthalmology

## 2021-09-27 ENCOUNTER — Encounter: Payer: Self-pay | Admitting: Ophthalmology

## 2021-10-02 DIAGNOSIS — H2511 Age-related nuclear cataract, right eye: Secondary | ICD-10-CM | POA: Diagnosis not present

## 2021-10-03 NOTE — Discharge Instructions (Signed)

## 2021-10-07 ENCOUNTER — Other Ambulatory Visit: Payer: Self-pay | Admitting: Family Medicine

## 2021-10-07 DIAGNOSIS — F320 Major depressive disorder, single episode, mild: Secondary | ICD-10-CM

## 2021-10-07 DIAGNOSIS — F411 Generalized anxiety disorder: Secondary | ICD-10-CM

## 2021-10-08 ENCOUNTER — Other Ambulatory Visit: Payer: Self-pay

## 2021-10-08 ENCOUNTER — Ambulatory Visit: Payer: Medicare Other | Admitting: Anesthesiology

## 2021-10-08 ENCOUNTER — Encounter
Admission: RE | Disposition: A | Discharge status: Home / Self Care | Payer: Self-pay | Source: Home / Self Care | Attending: Ophthalmology

## 2021-10-08 ENCOUNTER — Encounter: Payer: Self-pay | Admitting: Ophthalmology

## 2021-10-08 ENCOUNTER — Ambulatory Visit
Admission: RE | Admit: 2021-10-08 | Discharge: 2021-10-08 | Disposition: A | Discharge status: Home / Self Care | Payer: Medicare Other | Attending: Ophthalmology | Admitting: Ophthalmology

## 2021-10-08 DIAGNOSIS — H2511 Age-related nuclear cataract, right eye: Secondary | ICD-10-CM | POA: Insufficient documentation

## 2021-10-08 DIAGNOSIS — I10 Essential (primary) hypertension: Secondary | ICD-10-CM | POA: Diagnosis not present

## 2021-10-08 DIAGNOSIS — Z8673 Personal history of transient ischemic attack (TIA), and cerebral infarction without residual deficits: Secondary | ICD-10-CM | POA: Insufficient documentation

## 2021-10-08 DIAGNOSIS — H25811 Combined forms of age-related cataract, right eye: Secondary | ICD-10-CM | POA: Diagnosis not present

## 2021-10-08 DIAGNOSIS — Z79899 Other long term (current) drug therapy: Secondary | ICD-10-CM | POA: Diagnosis not present

## 2021-10-08 DIAGNOSIS — J449 Chronic obstructive pulmonary disease, unspecified: Secondary | ICD-10-CM | POA: Diagnosis not present

## 2021-10-08 DIAGNOSIS — F17210 Nicotine dependence, cigarettes, uncomplicated: Secondary | ICD-10-CM | POA: Insufficient documentation

## 2021-10-08 DIAGNOSIS — K219 Gastro-esophageal reflux disease without esophagitis: Secondary | ICD-10-CM | POA: Diagnosis not present

## 2021-10-08 SURGERY — PHACOEMULSIFICATION, CATARACT, WITH IOL INSERTION
Anesthesia: Monitor Anesthesia Care | Site: Eye | Laterality: Right

## 2021-10-08 MED ORDER — LACTATED RINGERS IV SOLN: INTRAVENOUS | Status: DC

## 2021-10-08 MED ORDER — LIDOCAINE HCL (PF) 2 % IJ SOLN
INTRAOCULAR | Status: DC | PRN
  Administered 2021-10-08: 1 mL via INTRAOCULAR

## 2021-10-08 MED ORDER — SIGHTPATH DOSE#1 BSS IO SOLN
INTRAOCULAR | Status: DC | PRN
  Administered 2021-10-08: 15 mL

## 2021-10-08 MED ORDER — MOXIFLOXACIN HCL 0.5 % OP SOLN
OPHTHALMIC | Status: DC | PRN
  Administered 2021-10-08: 0.2 mL via OPHTHALMIC

## 2021-10-08 MED ORDER — MIDAZOLAM HCL 2 MG/2ML IJ SOLN
INTRAMUSCULAR | Status: DC | PRN
  Administered 2021-10-08: 1 mg via INTRAVENOUS

## 2021-10-08 MED ORDER — TETRACAINE HCL 0.5 % OP SOLN
1.0000 [drp] | OPHTHALMIC | Status: DC | PRN
  Administered 2021-10-08 (×3): 1 [drp] via OPHTHALMIC

## 2021-10-08 MED ORDER — ARMC OPHTHALMIC DILATING DROPS
1.0000 "application " | OPHTHALMIC | Status: DC | PRN
  Administered 2021-10-08 (×3): 1 via OPHTHALMIC

## 2021-10-08 MED ORDER — FENTANYL CITRATE (PF) 100 MCG/2ML IJ SOLN
INTRAMUSCULAR | Status: DC | PRN
  Administered 2021-10-08: 50 ug via INTRAVENOUS

## 2021-10-08 MED ORDER — SIGHTPATH DOSE#1 SODIUM HYALURONATE 10 MG/ML IO SOLUTION
PREFILLED_SYRINGE | INTRAOCULAR | Status: DC | PRN
Start: 2021-10-08 — End: 2021-10-08
  Administered 2021-10-08: 0.85 mL via INTRAOCULAR

## 2021-10-08 MED ORDER — SIGHTPATH DOSE#1 BSS IO SOLN
INTRAOCULAR | Status: DC | PRN
  Administered 2021-10-08: 79 mL via OPHTHALMIC

## 2021-10-08 MED ORDER — SIGHTPATH DOSE#1 SODIUM HYALURONATE 23 MG/ML IO SOLUTION
PREFILLED_SYRINGE | INTRAOCULAR | Status: DC | PRN
  Administered 2021-10-08: 0.6 mL via INTRAOCULAR

## 2021-10-08 SURGICAL SUPPLY — 14 items
CATARACT SUITE SIGHTPATH (MISCELLANEOUS) ×2 IMPLANT
DISSECTOR HYDRO NUCLEUS 50X22 (MISCELLANEOUS) ×2 IMPLANT
FEE CATARACT SUITE SIGHTPATH (MISCELLANEOUS) ×1 IMPLANT
GLOVE SURG GAMMEX PI TX LF 7.5 (GLOVE) ×2 IMPLANT
GLOVE SURG SYN 8.5  E (GLOVE) ×1
GLOVE SURG SYN 8.5 E (GLOVE) ×1 IMPLANT
GLOVE SURG SYN 8.5 PF PI (GLOVE) ×1 IMPLANT
LENS IOL TECNIS EYHANCE 24.0 (Intraocular Lens) ×1 IMPLANT
NDL FILTER BLUNT 18X1 1/2 (NEEDLE) ×1 IMPLANT
NEEDLE FILTER BLUNT 18X 1/2SAF (NEEDLE) ×1
NEEDLE FILTER BLUNT 18X1 1/2 (NEEDLE) ×1 IMPLANT
SYR 3ML LL SCALE MARK (SYRINGE) ×2 IMPLANT
SYR 5ML LL (SYRINGE) ×2 IMPLANT
WATER STERILE IRR 250ML POUR (IV SOLUTION) ×2 IMPLANT

## 2021-10-08 NOTE — Telephone Encounter (Signed)
Requested medication (s) are due for refill today:   Yes ? ?Requested medication (s) are on the active medication list:   Yes ? ?Future visit scheduled:   No  ? ? ?Last ordered: 05/16/2021 #30, 3 refills ? ?Returned because not sure who the PCP is.   It looks like it is Dr. Parks Ranger but this was last ordered by Dr. Caryn Section with [facility].   ? ?Requested Prescriptions  ?Pending Prescriptions Disp Refills  ? citalopram (CELEXA) 40 MG tablet [Pharmacy Med Name: CITALOPRAM '40MG'$  TABLETS] 30 tablet 3  ?  Sig: TAKE 1 TABLET(40 MG) BY MOUTH DAILY  ?  ? Psychiatry:  Antidepressants - SSRI Passed - 10/07/2021  7:11 AM  ?  ?  Passed - Completed PHQ-2 or PHQ-9 in the last 360 days  ?  ?  Passed - Valid encounter within last 6 months  ?  Recent Outpatient Visits   ? ?      ? 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 [place], DO  ? 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 [place], DO  ? 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 [facility] Gwyneth Sprout, FNP  ? 10 months ago Primary hypertension  ? [place], PA-C  ? 1 year ago Primary hypertension  ? [facility], Anderson Malta M, [place]  ? ?  ?  ? ?  ?  ?  ? ?

## 2021-10-08 NOTE — Anesthesia Preprocedure Evaluation (Addendum)
Anesthesia Evaluation  ?Patient identified by MRN, date of birth, ID band ?Patient awake ? ? ? ?History of Anesthesia Complications ?Negative for: history of anesthetic complications ? ?Airway ?Mallampati: II ? ?TM Distance: >3 FB ?Neck ROM: Full ? ? ? Dental ? ?(+) Lower Dentures, Upper Dentures ?  ?Pulmonary ?COPD,  COPD inhaler, Current Smoker and Patient abstained from smoking.,  ?  ?Pulmonary exam normal ? ? ? ? ? ? ? Cardiovascular ?hypertension, Pt. on medications and Pt. on home beta blockers ? ?Rhythm:Regular Rate:Normal ?+ Systolic murmurs ?2017 echo- mild TR/MR ? ?INTERPRETATION  ?NORMAL LEFT VENTRICULAR SYSTOLIC FUNCTION?? WITH MILD LVH  ?NORMAL RIGHT VENTRICULAR SYSTOLIC FUNCTION  ?MILD VALVULAR REGURGITATION (See above)  ?NO VALVULAR STENOSIS  ?EF >55%  ? ?  ?Neuro/Psych ?CVA (based on imaging studies for other reasons, incidental finding)   ? GI/Hepatic ?Neg liver ROS, GERD  ,  ?Endo/Other  ?negative endocrine ROS ? Renal/GU ?Renal disease (renal cyst)  ? ?  ?Musculoskeletal ?Inflammatory myopathy  ? Abdominal ?  ?Peds ? Hematology ?negative hematology ROS ?(+)   ?Anesthesia Other Findings ? ? Reproductive/Obstetrics ? ?  ? ? ? ? ? ? ? ? ? ? ? ? ? ?  ?  ? ? ? ? ? ? ? ?Anesthesia Physical ?Anesthesia Plan ? ?ASA: 3 ? ?Anesthesia Plan: MAC  ? ?Post-op Pain Management: Minimal or no pain anticipated  ? ?Induction:  ? ?PONV Risk Score and Plan: 1 and TIVA, Midazolam and Treatment may vary due to age or medical condition ? ?Airway Management Planned: Nasal Cannula ? ?Additional Equipment: None ? ?Intra-op Plan:  ? ?Post-operative Plan:  ? ?Informed Consent: I have reviewed the patients History and Physical, chart, labs and discussed the procedure including the risks, benefits and alternatives for the proposed anesthesia with the patient or authorized representative who has indicated his/her understanding and acceptance.  ? ? ? ? ? ?Plan Discussed with: CRNA ? ?Anesthesia  Plan Comments:   ? ? ? ? ? ? ?Anesthesia Quick Evaluation ? ?

## 2021-10-08 NOTE — Transfer of Care (Signed)
Immediate Anesthesia Transfer of Care Note ? ?Patient: [name] [name] ? ?Procedure(s) Performed: CATARACT EXTRACTION PHACO AND INTRAOCULAR LENS PLACEMENT (IOC) RIGHT 3.84 00:35.3 (Right: Eye) ? ?Patient Location: PACU ? ?Anesthesia Type: MAC ? ?Level of Consciousness: awake, alert  and patient cooperative ? ?Airway and Oxygen Therapy: Patient Spontanous Breathing and Patient connected to supplemental oxygen ? ?Post-op Assessment: Post-op Vital signs reviewed, Patient's Cardiovascular Status Stable, Respiratory Function Stable, Patent Airway and No signs of Nausea or vomiting ? ?Post-op Vital Signs: Reviewed and stable ? ?Complications: No notable events documented. ? ?

## 2021-10-08 NOTE — Op Note (Signed)
OPERATIVE NOTE ? ?Ardean Simonich ?[id number] ?10/08/2021 ? ? ?PREOPERATIVE DIAGNOSIS:  Nuclear sclerotic cataract right eye.  H25.11 ?  ?POSTOPERATIVE DIAGNOSIS:    Nuclear sclerotic cataract right eye.   ?  ?PROCEDURE:  Phacoemusification with posterior chamber intraocular lens placement of the right eye  ? ?LENS:   ?Implant Name Type Inv. Item Serial No. Manufacturer Lot No. LRB No. Used Action  ?LENS IOL TECNIS EYHANCE 24.0 - [id number] Intraocular Lens LENS IOL TECNIS EYHANCE 24.0 [id number] [employer]  Right 1 Implanted  ?    ? ?Procedure(s): ?CATARACT EXTRACTION PHACO AND INTRAOCULAR LENS PLACEMENT (IOC) RIGHT 3.84 00:35.3 (Right) ? ?DIB00 +24.0 ?  ?ULTRASOUND TIME: 0 minutes 35 seconds.  CDE 3.84 ?  ?SURGEON:  Benay Pillow, MD, MPH ? ?ANESTHESIOLOGIST: Anesthesiologist: Page, Adele Barthel, MD ?CRNA: Silvana Newness, CRNA ?  ?ANESTHESIA:  Topical with tetracaine drops augmented with 1% preservative-free intracameral lidocaine. ? ?ESTIMATED BLOOD LOSS: less than 1 mL. ?  ?COMPLICATIONS:  None. ?  ?DESCRIPTION OF PROCEDURE:  The patient was identified in the holding room and transported to the operating room and placed in the supine position under the operating microscope.  The right eye was identified as the operative eye and it was prepped and draped in the usual sterile ophthalmic fashion. ?  ?A 1.0 millimeter clear-corneal paracentesis was made at the 10:30 position. 0.5 ml of preservative-free 1% lidocaine with epinephrine was injected into the anterior chamber. ? The anterior chamber was filled with Healon 5 viscoelastic.  A 2.4 millimeter keratome was used to make a near-clear corneal incision at the 8:00 position.  A curvilinear capsulorrhexis was made with a cystotome and capsulorrhexis forceps.  Balanced salt solution was used to hydrodissect and hydrodelineate the nucleus. ?  ?Phacoemulsification was then used in stop and chop fashion to remove the lens nucleus and epinucleus.  The remaining cortex was then  removed using the irrigation and aspiration handpiece. Healon was then placed into the capsular bag to distend it for lens placement.  A lens was then injected into the capsular bag.  The remaining viscoelastic was aspirated. ?  ?Wounds were hydrated with balanced salt solution.  The anterior chamber was inflated to a physiologic pressure with balanced salt solution.  ? ?Intracameral vigamox 0.1 mL undiluted was injected into the eye and a drop placed onto the ocular surface. ? ?No wound leaks were noted.  The patient was taken to the recovery room in stable condition without complications of anesthesia or surgery ? ?Benay Pillow ?10/08/2021, 8:22 AM ? ?

## 2021-10-08 NOTE — Anesthesia Postprocedure Evaluation (Signed)
Anesthesia Post Note ? ?Patient: [name] [name] ? ?Procedure(s) Performed: CATARACT EXTRACTION PHACO AND INTRAOCULAR LENS PLACEMENT (IOC) RIGHT 3.84 00:35.3 (Right: Eye) ? ? ?  ?Patient location during evaluation: PACU ?Anesthesia Type: MAC ?Level of consciousness: awake and alert ?Pain management: pain level controlled ?Vital Signs Assessment: post-procedure vital signs reviewed and stable ?Respiratory status: spontaneous breathing ?Cardiovascular status: blood pressure returned to baseline ?Postop Assessment: no apparent nausea or vomiting, adequate PO intake and no headache ?Anesthetic complications: no ? ? ?No notable events documented. ? ?[name] [name] Alan Riles ? ? ? ? ? ?

## 2021-10-08 NOTE — H&P (Signed)
[place]  ? ?Primary Care Physician:  Olin Hauser, DO ?Ophthalmologist: Dr. Benay Pillow ? ?Pre-Procedure History & Physical: ?HPI:  [name] [name] is a 76 y.o. female here for cataract surgery. ?  ?Past Medical History:  ?Diagnosis Date  ? Anxiety   ? CAD (coronary artery disease) unk  ? Chronic pain   ? COPD (chronic obstructive pulmonary disease) ([place])   ? Depression   ? GERD (gastroesophageal reflux disease)   ? Hypercholesteremia unk  ? Hypertension   ? 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 Stroke [facility])   ? pt reports TIAs around 2016.  Some speech deficit.  ? Wears dentures   ? full upper and lower  ? ? ?Past Surgical History:  ?Procedure Laterality Date  ? ABDOMINAL HYSTERECTOMY  1982  ? BACK SURGERY    ? CATARACT EXTRACTION W/PHACO Left 09/24/2021  ? Procedure: CATARACT EXTRACTION PHACO AND INTRAOCULAR LENS PLACEMENT ([place]) LEFT;  Surgeon: Eulogio Bear, MD;  Location: [facility];  Service: Ophthalmology;  Laterality: Left;  3.80 ?0:31.7  ? COLONOSCOPY WITH PROPOFOL N/A 04/07/2018  ? Procedure: COLONOSCOPY WITH PROPOFOL;  Surgeon: [name], MD;  Location: [facility] ENDOSCOPY;  Service: Endoscopy;  Laterality: N/A;  ? KYPHOPLASTY N/A 10/23/2017  ? Procedure: [id number];  Surgeon: Hessie Knows, MD;  Location: [facility] ORS;  Service: Orthopedics;  Laterality: N/A;  ? ? ?Prior to Admission medications   ?Medication Sig Start Date End Date Taking? Authorizing Provider  ?albuterol (VENTOLIN HFA) 108 (90 Base) MCG/ACT inhaler Inhale 2 puffs into the lungs every 4 (four) hours as needed for wheezing or shortness of breath. 02/21/20  Yes Mar Daring, PA-C  ?ALPRAZolam (XANAX) 0.5 MG tablet Take 1 tablet (0.5 mg total) by mouth 3 (three) times daily as needed for anxiety. 09/10/21  Yes Karamalegos, Devonne Doughty, DO  ?amLODipine (NORVASC) 5 MG tablet TAKE 1 TABLET(5 MG) BY MOUTH DAILY 04/23/21  Yes Jerrol Banana., MD  ?aspirin 81 MG tablet Take 81 mg by mouth  daily.   Yes [provider]  ?Calcium 600-200 MG-UNIT tablet Take 1 tablet by mouth daily.   Yes [provider]  ?cholecalciferol (VITAMIN D) 25 MCG (1000 UNIT) tablet Take 1,000 Units by mouth daily.   Yes [provider]  ?citalopram (CELEXA) 40 MG tablet TAKE 1 TABLET(40 MG) BY MOUTH DAILY 05/16/21  Yes Birdie Sons, MD  ?docusate sodium (COLACE) 100 MG capsule Take 100 mg by mouth 2 (two) times daily as needed for mild constipation.   Yes [provider]  ?furosemide (LASIX) 20 MG tablet Take 20 mg by mouth daily as needed for fluid.    Yes [provider]  ?gabapentin (NEURONTIN) 600 MG tablet Take 1 tablet (600 mg total) by mouth 3 (three) times daily. 09/10/21  Yes Karamalegos, Devonne Doughty, DO  ?lisinopril (ZESTRIL) 40 MG tablet TAKE 1 TABLET(40 MG) BY MOUTH DAILY 08/06/21  Yes Gwyneth Sprout, FNP  ?meloxicam (MOBIC) 7.5 MG tablet Take 1 tablet (7.5 mg total) by mouth daily as needed for pain (arthritis). 09/10/21  Yes Karamalegos, Devonne Doughty, DO  ?metoprolol succinate (TOPROL-XL) 25 MG 24 hr tablet TAKE 1 TABLET(25 MG) BY MOUTH DAILY 05/03/21  Yes Jerrol Banana., MD  ?pantoprazole (PROTONIX) 20 MG tablet TAKE 1 TABLET(20 MG) BY MOUTH DAILY 09/29/20  Yes Mar Daring, PA-C  ?Propylene Glycol (SYSTANE COMPLETE OP) Apply to eye.   Yes [provider]  ?simvastatin (ZOCOR) 10 MG  tablet TAKE 1 TABLET(10 MG) BY MOUTH DAILY 09/10/21  Yes Olin Hauser, DO  ?Tiotropium Bromide Monohydrate (SPIRIVA RESPIMAT) 1.25 MCG/ACT AERS Inhale 1 puff into the lungs daily. 02/21/20  Yes Mar Daring, PA-C  ?traMADol (ULTRAM) 50 MG tablet Take 1 tablet (50 mg total) by mouth every 6 (six) hours as needed for moderate pain or severe pain. 09/10/21  Yes Karamalegos, Devonne Doughty, DO  ?potassium chloride SA (K-DUR,KLOR-CON) 20 MEQ tablet Take 1 tablet (20 mEq total) by mouth daily. 10/01/17   Mar Daring, PA-C  ? ? ?Allergies as of 08/14/2021   ? (No Known Allergies)  ? ? ?Family History  ?Problem Relation Age of Onset  ? Leukemia Mother   ? Aneurysm Father   ? Diabetes Father   ? Heart disease Father   ? Liver cancer Sister   ? Cancer Sister   ? Lung cancer Sister   ?     metastatic  ? Diabetes Maternal Grandmother   ? Diabetes Paternal Grandmother   ? ? ?Social History  ? ?Socioeconomic History  ? Marital status: Widowed  ?  Spouse name: Not on file  ? Number of children: 1  ? Years of education: Not on file  ? Highest education level: Some college, no degree  ?Occupational History  ?  Employer: RETIRED  ?Tobacco Use  ? Smoking status: Every Day  ?  Packs/day: 0.50  ?  Years: 53.00  ?  Pack years: 26.50  ?  Types: Cigarettes  ? Smokeless tobacco: Never  ? Tobacco comments:  ?  Since age 29  ?Vaping Use  ? Vaping Use: Never used  ?Substance and Sexual Activity  ? Alcohol use: Not Currently  ?  Alcohol/week: 2.0 standard drinks  ?  Types: 2 Glasses of wine per week  ? Drug use: Not Currently  ?  Types: Marijuana  ?  Comment: in the past  ? Sexual activity: Not on file  ?Other Topics Concern  ? Not on file  ?Social History Narrative  ? Not on file  ? ?Social Determinants of Health  ? ?Financial Resource Strain: Low Risk   ? Difficulty of Paying Living Expenses: Not hard at all  ?Food Insecurity: No Food Insecurity  ? Worried About Charity fundraiser in the Last Year: Never true  ? Ran Out of Food in the Last Year: Never true  ?Transportation Needs: No Transportation Needs  ? Lack of Transportation (Medical): No  ? Lack of Transportation (Non-Medical): No  ?Physical Activity: Inactive  ? Days of Exercise per Week: 0 days  ? Minutes of Exercise per Session: 0 min  ?Stress: Stress Concern Present  ? Feeling of Stress : Rather much  ?Social Connections: Socially Isolated  ? Frequency of Communication with Friends and Family: More than three times a week  ? Frequency of Social Gatherings with Friends and Family: Never  ? Attends Religious Services: Never  ?  Active Member of Clubs or Organizations: No  ? Attends Archivist Meetings: Never  ? Marital Status: Widowed  ?Intimate Partner Violence: Not on file  ? ? ?Review of Systems: ?See HPI, otherwise negative ROS ? ?Physical Exam: ?BP (!) 146/58   Pulse (!) 58   Temp (!) 97 ?F (36.1 ?C) (Temporal)   Resp 18   Ht '5\' 4"'$  (1.626 m)   Wt 37.6 kg   SpO2 92%   BMI 14.25 kg/m?  ?General:   Alert, cooperative in NAD ?Head:  Normocephalic  and atraumatic. ?Respiratory:  Normal work of breathing. ?Cardiovascular:  RRR ? ?Impression/Plan: ?[name] [name] is here for cataract surgery. ? ?Risks, benefits, limitations, and alternatives regarding cataract surgery have been reviewed with the patient.  Questions have been answered.  All parties agreeable. ? ? ?Benay Pillow, MD  10/08/2021, 7:54 AM ? ? ?

## 2021-10-09 ENCOUNTER — Encounter: Payer: Self-pay | Admitting: Ophthalmology

## 2021-10-22 ENCOUNTER — Telehealth: Payer: Self-pay | Admitting: Family Medicine

## 2021-10-22 NOTE — Telephone Encounter (Signed)
Stanton Kidney NP with [facility] calls called to say she did a Scientist, research (medical) on Ms. 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 ?CB#  [phone] ?

## 2021-10-23 NOTE — Telephone Encounter (Signed)
We can follow up with patient in future for arterial circulation and consider referral to vascular ? ?Nobie Putnam, DO ?[facility] ?[facility] ?10/23/2021, 1:52 PM ? ?

## 2021-10-30 DIAGNOSIS — Z961 Presence of intraocular lens: Secondary | ICD-10-CM | POA: Diagnosis not present

## 2021-11-06 ENCOUNTER — Other Ambulatory Visit: Payer: Self-pay | Admitting: Family Medicine

## 2021-11-06 DIAGNOSIS — I10 Essential (primary) hypertension: Secondary | ICD-10-CM

## 2021-11-27 ENCOUNTER — Encounter: Payer: Self-pay | Admitting: Family Medicine

## 2021-11-27 ENCOUNTER — Ambulatory Visit (INDEPENDENT_AMBULATORY_CARE_PROVIDER_SITE_OTHER): Payer: Medicare Other | Admitting: Family Medicine

## 2021-11-27 VITALS — BP 122/70 | HR 66 | Ht 64.0 in | Wt 80.8 lb

## 2021-11-27 DIAGNOSIS — M4699 Unspecified inflammatory spondylopathy, multiple sites in spine: Secondary | ICD-10-CM

## 2021-11-27 DIAGNOSIS — F411 Generalized anxiety disorder: Secondary | ICD-10-CM | POA: Diagnosis not present

## 2021-11-27 DIAGNOSIS — M545 Low back pain, unspecified: Secondary | ICD-10-CM

## 2021-11-27 DIAGNOSIS — K635 Polyp of colon: Secondary | ICD-10-CM | POA: Diagnosis not present

## 2021-11-27 DIAGNOSIS — G8929 Other chronic pain: Secondary | ICD-10-CM

## 2021-11-27 MED ORDER — MELOXICAM 7.5 MG PO TABS: 7.5000 mg | ORAL_TABLET | Freq: Every day | ORAL | 1 refills | Status: DC | PRN

## 2021-11-27 MED ORDER — ALPRAZOLAM 0.5 MG PO TABS: 0.5000 mg | ORAL_TABLET | Freq: Three times a day (TID) | ORAL | 2 refills | Status: DC | PRN

## 2021-11-27 MED ORDER — HYDROCODONE-ACETAMINOPHEN 5-325 MG PO TABS: 1.0000 | ORAL_TABLET | Freq: Four times a day (QID) | ORAL | 0 refills | Status: DC | PRN

## 2021-11-27 NOTE — Progress Notes (Signed)
? ?Subjective:  ? ? Patient ID: [name] [name], female    DOB: 1945-11-03, 76 y.o.   MRN: [id number] ? ?Marion Seese is a 76 y.o. female presenting on 11/27/2021 for Back Pain ? ? ?HPI ? ? ?Chronic Back Pain / Inflammatory Spondylopathy ?Scoliosis ?Osteoporosis ?Followed by [facility] Dr Kayleen Memos ?Hx Shoulder Fracture / Kyphoplasty failed. ?  ?Previously Taking Hydrocodone-Acetaminophen PRN q 6 hr PRN up to 4 times daily, for past 6 years since broke shoulder. ?Taking Gabapentin and Meloxicam ?Most of her pain is mostly Lower back, she was dx with Spondylolesthesis L5 - S1 ?  ?All medications seem to help her pain, but still describes chronic pain all the time ?She admits doing a lot of housework that she continues to do, housework, cleaning, has a small dog and feeds/fills water etc worse with bending. ? ?Last visit with me 08/2021 she was switched off Hydrocodone and placed on Tramadol. She was referred to Pain Management. ? ?Waiting on referral apt. ?  ?  ?Anxiety ?Insomnia ?Chronic problem with anxiety, taking Xanax 0.93m PRN for past 20+ years. ?Husband passed 9 years ago. She also lost her parents and her sister. ?Overwhelmed more recently with life changes with loss of family members. ?Admits caffeine making her more anxious. ? ?Doing better on Xanax TID now at this time. Due for refill ? ?Note had home health nurse assessment with ABI results showing reduced circulation bilateral R 0.23 and L 0.17 ? ? ? ?  11/27/2021  ?  2:47 PM 09/10/2021  ?  2:04 PM 09/06/2021  ? 11:14 AM  ?Depression screen PHQ 2/9  ?Decreased Interest _0 ?Down, Depressed, Hopeless _1 ?PHQ - 2 Score _2 ?Altered sleeping _3 ?Tired, decreased energy _4 ?Change in appetite 2 2 0  ?Feeling bad or failure about yourself  _5 ?Trouble concentrating _6 ?Moving slowly or fidgety/restless 2 0 2  ?Suicidal thoughts _7 ?PHQ-9 Score _8 ?Difficult doing work/chores Extremely dIfficult Extremely dIfficult Very  difficult  ? ? ?Social History  ? ?Tobacco Use  ? Smoking status: Every Day  ?  Packs/day: 0.50  ?  Years: 53.00  ?  Pack years: 26.50  ?  Types: Cigarettes  ? Smokeless tobacco: Never  ? Tobacco comments:  ?  Since age 76 ?Vaping Use  ? Vaping Use: Never used  ?Substance Use Topics  ? Alcohol use: Not Currently  ?  Alcohol/week: 2.0 standard drinks  ?  Types: 2 Glasses of wine per week  ? Drug use: Not Currently  ?  Types: Marijuana  ?  Comment: in the past  ? ? ?Review of Systems ?Per HPI unless specifically indicated above ? ?   ?Objective:  ?  ?BP 122/70   Pulse 66   Ht _9  (1.626 m)   Wt 80 lb 12.8 oz (36.7 kg)   SpO2 97%   BMI 13.87 kg/m?   ?Wt Readings from Last 3 Encounters:  ?11/27/21 80 lb 12.8 oz (36.7 kg)  ?10/08/21 83 lb (37.6 kg)  ?09/24/21 81 lb (36.7 kg)  ?  ?Physical Exam ?Vitals and nursing note reviewed.  ?Constitutional:   ?   General: She is not in acute distress. ?   Appearance: Normal appearance. She is well-developed. She is not diaphoretic.  ?   Comments: Well-appearing, comfortable, cooperative  ?HENT:  ?  Head: Normocephalic and atraumatic.  ?Eyes:  ?   General:     ?   Right eye: No discharge.     ?   Left eye: No discharge.  ?   Conjunctiva/sclera: Conjunctivae normal.  ?Cardiovascular:  ?   Rate and Rhythm: Normal rate.  ?Pulmonary:  ?   Effort: Pulmonary effort is normal.  ?Skin: ?   General: Skin is warm and dry.  ?   Findings: No erythema or rash.  ?Neurological:  ?   Mental Status: She is alert and oriented to person, place, and time.  ?Psychiatric:     ?   Mood and Affect: Mood normal.     ?   Behavior: Behavior normal.     ?   Thought Content: Thought content normal.  ?   Comments: Well groomed, good eye contact, normal speech and thoughts  ? ? ? ? ?Results for orders placed or performed in visit on 12/08/20  ?CBC with Differential/Platelet  ?Result Value Ref Range  ? WBC 6.3 3.4 - 10.8 x10E3/uL  ? RBC 3.81 3.77 - 5.28 x10E6/uL  ? Hemoglobin 12.4 11.1 - 15.9 g/dL  ?  Hematocrit 37.3 34.0 - 46.6 %  ? MCV 98 (H) 79 - 97 fL  ? MCH 32.5 26.6 - 33.0 pg  ? MCHC 33.2 31.5 - 35.7 g/dL  ? RDW 12.5 11.7 - 15.4 %  ? Platelets 225 150 - 450 x10E3/uL  ? Neutrophils 51 Not Estab. %  ? Lymphs 36 Not Estab. %  ? Monocytes 8 Not Estab. %  ? Eos 4 Not Estab. %  ? Basos 1 Not Estab. %  ? Neutrophils Absolute 3.1 1.4 - 7.0 x10E3/uL  ? Lymphocytes Absolute 2.3 0.7 - 3.1 x10E3/uL  ? Monocytes Absolute 0.5 0.1 - 0.9 x10E3/uL  ? EOS (ABSOLUTE) 0.2 0.0 - 0.4 x10E3/uL  ? Basophils Absolute 0.1 0.0 - 0.2 x10E3/uL  ? Immature Granulocytes 0 Not Estab. %  ? Immature Grans (Abs) 0.0 0.0 - 0.1 x10E3/uL  ?Comprehensive metabolic panel  ?Result Value Ref Range  ? Glucose 102 (H) 65 - 99 mg/dL  ? BUN 14 8 - 27 mg/dL  ? Creatinine, Ser 1.05 (H) 0.57 - 1.00 mg/dL  ? eGFR 56 (L) >59 mL/min/1.73  ? BUN/Creatinine Ratio 13 12 - 28  ? Sodium 136 134 - 144 mmol/L  ? Potassium 4.4 3.5 - 5.2 mmol/L  ? Chloride 101 96 - 106 mmol/L  ? CO2 20 20 - 29 mmol/L  ? Calcium 9.4 8.7 - 10.3 mg/dL  ? Total Protein 7.2 6.0 - 8.5 g/dL  ? Albumin 4.6 3.7 - 4.7 g/dL  ? Globulin, Total 2.6 1.5 - 4.5 g/dL  ? Albumin/Globulin Ratio 1.8 1.2 - 2.2  ? Bilirubin Total 0.3 0.0 - 1.2 mg/dL  ? Alkaline Phosphatase 110 44 - 121 IU/L  ? AST 24 0 - 40 IU/L  ? ALT 13 0 - 32 IU/L  ?VITAMIN D 25 Hydroxy (Vit-D Deficiency, Fractures)  ?Result Value Ref Range  ? Vit D, 25-Hydroxy 27.6 (L) 30.0 - 100.0 ng/mL  ?Lipid panel  ?Result Value Ref Range  ? Cholesterol, Total 142 100 - 199 mg/dL  ? Triglycerides 103 0 - 149 mg/dL  ? HDL 61 >39 mg/dL  ? VLDL Cholesterol Cal 19 5 - 40 mg/dL  ? LDL Chol Calc (NIH) 62 0 - 99 mg/dL  ? Chol/HDL Ratio 2.3 0.0 - 4.4 ratio  ?TSH  ?Result Value Ref Range  ? TSH  1.240 0.450 - 4.500 uIU/mL  ? ?   ?Assessment & Plan:  ? ?Problem List Items Addressed This Visit   ? ? 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 Relevant Medications  ? HYDROcodone-acetaminophen (NORCO/VICODIN) 5-325 MG tablet  ?  meloxicam (MOBIC) 7.5 MG tablet  ? GAD (generalized anxiety disorder)  ? Relevant Medications  ? ALPRAZolam (XANAX) 0.5 MG tablet  ? Chronic bilateral low back pain without sciatica  ? Relevant Medications  ? HYDROcodone-acetaminophen (NORCO/VICODIN) 5-325 MG tablet  ? meloxicam (MOBIC) 7.5 MG tablet  ? ?Other Visit Diagnoses   ? ? Polyp of colon, unspecified part of colon, unspecified type      ? Relevant Orders  ? Ambulatory referral to Gastroenterology  ? ?  ?  ?Stop taking Tramadol since ineffective ?Switch back to Hydrocodone use as needed ?Refilled Meloxicam ?Continue Gabapentin ? ?We will check on status of the Pain Doctors in [place] ?Sent secure chat message to follow up on this ?Update they will process her referral now ? ?[facility] ?Address: [address] [place], [place], [place] [address] ?Phone: [phone] ? ?------------------------ ? ?Referral to Dr Allen Norris for GI Colonoscopy (last one done 2019 w polyps) ? ?[facility]) ?[address] ?[address] ?[place], [place] [address] ?Main: [phone] ? ?ABI abnormal ?Spider veins bilateral ?Offered referral to Vascular, decline today ? ? ?Orders Placed This Encounter  ?Procedures  ? Ambulatory referral to Gastroenterology  ?  Referral Priority:   Routine  ?  Referral Type:   Consultation  ?  Referral Reason:   Specialty Services Required  ?  Number of Visits Requested:   1  ? ? ? ?Meds ordered this encounter  ?Medications  ? HYDROcodone-acetaminophen (NORCO/VICODIN) 5-325 MG tablet  ?  Sig: Take 1 tablet by mouth every 6 (six) hours as needed for moderate pain.  ?  Dispense:  120 tablet  ?  Refill:  0  ? meloxicam (MOBIC) 7.5 MG tablet  ?  Sig: Take 1 tablet (7.5 mg total) by mouth daily as needed for pain (arthritis).  ?  Dispense:  90 tablet  ?  Refill:  1  ? ALPRAZolam (XANAX) 0.5 MG tablet  ?  Sig: Take 1 tablet (0.5 mg total) by mouth 3 (three) times daily as needed for anxiety.  ?  Dispense:  90 tablet  ?  Refill:  2   ? ? ? ? ?Follow up plan: ?Return in about 3 months (around 02/27/2022) for 3 month follow-up Pain Management. ? ? ?Nobie Putnam, DO ?[facility] ?[facility] ?11/27/2021, 2:50 PM ?

## 2021-11-27 NOTE — Patient Instructions (Addendum)
Thank you for coming to the office today. ? ?Stop taking Tramadol since ineffective ?Switch back to Hydrocodone use as needed ?Refilled Meloxicam ?Continue Gabapentin ? ?We will check on status of the Pain Doctors in [place] ? ?[facility] ?Address: [address] [place], [place], [place] [address] ?Phone: [phone] ? ?------------------------ ? ?Referral to Dr Allen Norris for GI Colonoscopy (last one done 2019 w polyps) ? ?[facility]) ?[place] ?[address] ?[place], [place] [address] ?Main: [phone] ? ?Please schedule a Follow-up Appointment to: Return in about 3 months (around 02/27/2022) for 3 month follow-up Pain Management. ? ?If you have any other questions or concerns, please feel free to call the office or send a message through [facility]. You may also schedule an earlier appointment if necessary. ? ?Additionally, you may be receiving a survey about your experience at our office within a few days to 1 week by e-mail or mail. We value your feedback. ? ?Nobie Putnam, DO ?[place] ?

## 2021-11-29 ENCOUNTER — Other Ambulatory Visit: Payer: Self-pay

## 2021-11-29 DIAGNOSIS — Z86010 Personal history of colonic polyps: Secondary | ICD-10-CM

## 2021-11-29 MED ORDER — NA SULFATE-K SULFATE-MG SULF 17.5-3.13-1.6 GM/177ML PO SOLN: 1.0000 | Freq: Once | ORAL | 0 refills | Status: AC

## 2021-11-29 NOTE — Progress Notes (Signed)
Gastroenterology Pre-Procedure Review ? ?Request Date: 01/01/2022 ?Requesting Physician: Dr. Allen Norris ? ?PATIENT REVIEW QUESTIONS: The patient responded to the following health history questions as indicated:   ? ?1. Are you having any GI issues? no ?2. Do you have a personal history of Polyps? yes (last colonoscopy ) ?3. Do you have a family history of Colon Cancer or Polyps? yes (colon cancer) ?4. Diabetes Mellitus? no ?5. Joint replacements in the past 12 months?no ?6. Major health problems in the past 3 months?no ?7. Any artificial heart valves, MVP, or defibrillator?no ?   ?MEDICATIONS & ALLERGIES:    ?Patient reports the following regarding taking any anticoagulation/antiplatelet therapy:   ?Plavix, Coumadin, Eliquis, Xarelto, Lovenox, Pradaxa, Brilinta, or Effient? no ?Aspirin? yes (81 mg) ? ?Patient confirms/reports the following medications:  ?Current Outpatient Medications  ?Medication Sig Dispense Refill  ? albuterol (VENTOLIN HFA) 108 (90 Base) MCG/ACT inhaler Inhale 2 puffs into the lungs every 4 (four) hours as needed for wheezing or shortness of breath. 18 g 5  ? ALPRAZolam (XANAX) 0.5 MG tablet Take 1 tablet (0.5 mg total) by mouth 3 (three) times daily as needed for anxiety. 90 tablet 2  ? amLODipine (NORVASC) 5 MG tablet TAKE 1 TABLET(5 MG) BY MOUTH DAILY 90 tablet 3  ? aspirin 81 MG tablet Take 81 mg by mouth daily.    ? Calcium 600-200 MG-UNIT tablet Take 1 tablet by mouth daily.    ? cholecalciferol (VITAMIN D) 25 MCG (1000 UNIT) tablet Take 1,000 Units by mouth daily.    ? citalopram (CELEXA) 40 MG tablet TAKE 1 TABLET(40 MG) BY MOUTH DAILY 30 tablet 3  ? docusate sodium (COLACE) 100 MG capsule Take 100 mg by mouth 2 (two) times daily as needed for mild constipation.    ? furosemide (LASIX) 20 MG tablet Take 20 mg by mouth daily as needed for fluid.     ? gabapentin (NEURONTIN) 600 MG tablet Take 1 tablet (600 mg total) by mouth 3 (three) times daily. 270 tablet 1  ? HYDROcodone-acetaminophen  (NORCO/VICODIN) 5-325 MG tablet Take 1 tablet by mouth every 6 (six) hours as needed for moderate pain. 120 tablet 0  ? lisinopril (ZESTRIL) 40 MG tablet TAKE 1 TABLET(40 MG) BY MOUTH DAILY 90 tablet 0  ? meloxicam (MOBIC) 7.5 MG tablet Take 1 tablet (7.5 mg total) by mouth daily as needed for pain (arthritis). 90 tablet 1  ? metoprolol succinate (TOPROL-XL) 25 MG 24 hr tablet TAKE 1 TABLET(25 MG) BY MOUTH DAILY 90 tablet 3  ? pantoprazole (PROTONIX) 20 MG tablet TAKE 1 TABLET(20 MG) BY MOUTH DAILY 90 tablet 1  ? potassium chloride SA (K-DUR,KLOR-CON) 20 MEQ tablet Take 1 tablet (20 mEq total) by mouth daily. 90 tablet 1  ? Propylene Glycol (SYSTANE COMPLETE OP) Apply to eye.    ? simvastatin (ZOCOR) 10 MG tablet TAKE 1 TABLET(10 MG) BY MOUTH DAILY 90 tablet 3  ? Tiotropium Bromide Monohydrate (SPIRIVA RESPIMAT) 1.25 MCG/ACT AERS Inhale 1 puff into the lungs daily. 4 g 5  ? ?No current facility-administered medications for this visit.  ? ? ?Patient confirms/reports the following allergies:  ?No Known Allergies ? ?No orders of the defined types were placed in this encounter. ? ? ?AUTHORIZATION INFORMATION ?Primary Insurance: ?1D#: ?Group #: ? ?Secondary Insurance: ?1D#: ?Group #: ? ?SCHEDULE INFORMATION: ?Date: 01/01/2022 ?Time: ?Location: [facility] ?

## 2021-12-26 ENCOUNTER — Emergency Department: Payer: Medicare Other

## 2021-12-26 ENCOUNTER — Inpatient Hospital Stay
Admission: EM | Admit: 2021-12-26 | Discharge: 2021-12-29 | DRG: 064 | Disposition: A | Discharge status: Hospice | Payer: Medicare Other | Attending: Internal Medicine | Admitting: Internal Medicine

## 2021-12-26 ENCOUNTER — Inpatient Hospital Stay: Payer: Medicare Other

## 2021-12-26 DIAGNOSIS — J90 Pleural effusion, not elsewhere classified: Secondary | ICD-10-CM | POA: Diagnosis present

## 2021-12-26 DIAGNOSIS — R4020 Unspecified coma: Secondary | ICD-10-CM | POA: Diagnosis not present

## 2021-12-26 DIAGNOSIS — E43 Unspecified severe protein-calorie malnutrition: Secondary | ICD-10-CM | POA: Diagnosis not present

## 2021-12-26 DIAGNOSIS — R062 Wheezing: Secondary | ICD-10-CM

## 2021-12-26 DIAGNOSIS — Z20822 Contact with and (suspected) exposure to covid-19: Secondary | ICD-10-CM | POA: Diagnosis not present

## 2021-12-26 DIAGNOSIS — Z90710 Acquired absence of both cervix and uterus: Secondary | ICD-10-CM

## 2021-12-26 DIAGNOSIS — I214 Non-ST elevation (NSTEMI) myocardial infarction: Secondary | ICD-10-CM | POA: Diagnosis present

## 2021-12-26 DIAGNOSIS — F17210 Nicotine dependence, cigarettes, uncomplicated: Secondary | ICD-10-CM | POA: Diagnosis present

## 2021-12-26 DIAGNOSIS — K219 Gastro-esophageal reflux disease without esophagitis: Secondary | ICD-10-CM | POA: Diagnosis present

## 2021-12-26 DIAGNOSIS — R29898 Other symptoms and signs involving the musculoskeletal system: Secondary | ICD-10-CM | POA: Diagnosis not present

## 2021-12-26 DIAGNOSIS — Z8711 Personal history of peptic ulcer disease: Secondary | ICD-10-CM

## 2021-12-26 DIAGNOSIS — Z79899 Other long term (current) drug therapy: Secondary | ICD-10-CM

## 2021-12-26 DIAGNOSIS — D62 Acute posthemorrhagic anemia: Secondary | ICD-10-CM | POA: Diagnosis present

## 2021-12-26 DIAGNOSIS — I2510 Atherosclerotic heart disease of native coronary artery without angina pectoris: Secondary | ICD-10-CM | POA: Diagnosis present

## 2021-12-26 DIAGNOSIS — I21A1 Myocardial infarction type 2: Secondary | ICD-10-CM | POA: Diagnosis not present

## 2021-12-26 DIAGNOSIS — I517 Cardiomegaly: Secondary | ICD-10-CM | POA: Diagnosis not present

## 2021-12-26 DIAGNOSIS — T17500A Unspecified foreign body in bronchus causing asphyxiation, initial encounter: Secondary | ICD-10-CM | POA: Diagnosis not present

## 2021-12-26 DIAGNOSIS — F32A Depression, unspecified: Secondary | ICD-10-CM | POA: Diagnosis not present

## 2021-12-26 DIAGNOSIS — J9602 Acute respiratory failure with hypercapnia: Secondary | ICD-10-CM | POA: Diagnosis not present

## 2021-12-26 DIAGNOSIS — Z681 Body mass index (BMI) 19 or less, adult: Secondary | ICD-10-CM

## 2021-12-26 DIAGNOSIS — J431 Panlobular emphysema: Secondary | ICD-10-CM | POA: Diagnosis present

## 2021-12-26 DIAGNOSIS — I6781 Acute cerebrovascular insufficiency: Secondary | ICD-10-CM | POA: Diagnosis not present

## 2021-12-26 DIAGNOSIS — J42 Unspecified chronic bronchitis: Secondary | ICD-10-CM | POA: Diagnosis not present

## 2021-12-26 DIAGNOSIS — I69328 Other speech and language deficits following cerebral infarction: Secondary | ICD-10-CM

## 2021-12-26 DIAGNOSIS — L89151 Pressure ulcer of sacral region, stage 1: Secondary | ICD-10-CM | POA: Diagnosis not present

## 2021-12-26 DIAGNOSIS — R748 Abnormal levels of other serum enzymes: Secondary | ICD-10-CM | POA: Diagnosis not present

## 2021-12-26 DIAGNOSIS — E7800 Pure hypercholesterolemia, unspecified: Secondary | ICD-10-CM | POA: Diagnosis present

## 2021-12-26 DIAGNOSIS — R1310 Dysphagia, unspecified: Secondary | ICD-10-CM | POA: Diagnosis not present

## 2021-12-26 DIAGNOSIS — N179 Acute kidney failure, unspecified: Secondary | ICD-10-CM | POA: Diagnosis not present

## 2021-12-26 DIAGNOSIS — Z7401 Bed confinement status: Secondary | ICD-10-CM | POA: Diagnosis not present

## 2021-12-26 DIAGNOSIS — L8990 Pressure ulcer of unspecified site, unspecified stage: Secondary | ICD-10-CM | POA: Insufficient documentation

## 2021-12-26 DIAGNOSIS — Z66 Do not resuscitate: Secondary | ICD-10-CM | POA: Diagnosis not present

## 2021-12-26 DIAGNOSIS — R6889 Other general symptoms and signs: Secondary | ICD-10-CM | POA: Diagnosis not present

## 2021-12-26 DIAGNOSIS — G8194 Hemiplegia, unspecified affecting left nondominant side: Secondary | ICD-10-CM | POA: Diagnosis not present

## 2021-12-26 DIAGNOSIS — N170 Acute kidney failure with tubular necrosis: Secondary | ICD-10-CM | POA: Diagnosis not present

## 2021-12-26 DIAGNOSIS — J9601 Acute respiratory failure with hypoxia: Secondary | ICD-10-CM | POA: Diagnosis not present

## 2021-12-26 DIAGNOSIS — I6340 Cerebral infarction due to embolism of unspecified cerebral artery: Secondary | ICD-10-CM | POA: Diagnosis not present

## 2021-12-26 DIAGNOSIS — D539 Nutritional anemia, unspecified: Secondary | ICD-10-CM | POA: Diagnosis present

## 2021-12-26 DIAGNOSIS — R531 Weakness: Secondary | ICD-10-CM | POA: Diagnosis not present

## 2021-12-26 DIAGNOSIS — I5022 Chronic systolic (congestive) heart failure: Secondary | ICD-10-CM | POA: Diagnosis not present

## 2021-12-26 DIAGNOSIS — R54 Age-related physical debility: Secondary | ICD-10-CM | POA: Diagnosis present

## 2021-12-26 DIAGNOSIS — D72829 Elevated white blood cell count, unspecified: Secondary | ICD-10-CM | POA: Diagnosis present

## 2021-12-26 DIAGNOSIS — I361 Nonrheumatic tricuspid (valve) insufficiency: Secondary | ICD-10-CM | POA: Diagnosis not present

## 2021-12-26 DIAGNOSIS — J449 Chronic obstructive pulmonary disease, unspecified: Secondary | ICD-10-CM | POA: Diagnosis present

## 2021-12-26 DIAGNOSIS — G8929 Other chronic pain: Secondary | ICD-10-CM | POA: Diagnosis present

## 2021-12-26 DIAGNOSIS — Z515 Encounter for palliative care: Secondary | ICD-10-CM

## 2021-12-26 DIAGNOSIS — E875 Hyperkalemia: Secondary | ICD-10-CM | POA: Diagnosis not present

## 2021-12-26 DIAGNOSIS — I499 Cardiac arrhythmia, unspecified: Secondary | ICD-10-CM | POA: Diagnosis not present

## 2021-12-26 DIAGNOSIS — Z743 Need for continuous supervision: Secondary | ICD-10-CM | POA: Diagnosis not present

## 2021-12-26 DIAGNOSIS — K922 Gastrointestinal hemorrhage, unspecified: Secondary | ICD-10-CM | POA: Diagnosis present

## 2021-12-26 DIAGNOSIS — I110 Hypertensive heart disease with heart failure: Secondary | ICD-10-CM | POA: Diagnosis not present

## 2021-12-26 DIAGNOSIS — I071 Rheumatic tricuspid insufficiency: Secondary | ICD-10-CM | POA: Diagnosis present

## 2021-12-26 DIAGNOSIS — I700 Atherosclerosis of aorta: Secondary | ICD-10-CM | POA: Diagnosis not present

## 2021-12-26 DIAGNOSIS — R414 Neurologic neglect syndrome: Secondary | ICD-10-CM | POA: Diagnosis present

## 2021-12-26 DIAGNOSIS — I639 Cerebral infarction, unspecified: Secondary | ICD-10-CM | POA: Diagnosis not present

## 2021-12-26 DIAGNOSIS — M810 Age-related osteoporosis without current pathological fracture: Secondary | ICD-10-CM | POA: Diagnosis present

## 2021-12-26 DIAGNOSIS — I6389 Other cerebral infarction: Secondary | ICD-10-CM | POA: Diagnosis not present

## 2021-12-26 DIAGNOSIS — Z4682 Encounter for fitting and adjustment of non-vascular catheter: Secondary | ICD-10-CM | POA: Diagnosis not present

## 2021-12-26 DIAGNOSIS — J9811 Atelectasis: Secondary | ICD-10-CM | POA: Diagnosis not present

## 2021-12-26 DIAGNOSIS — Z7189 Other specified counseling: Secondary | ICD-10-CM | POA: Diagnosis not present

## 2021-12-26 DIAGNOSIS — R0902 Hypoxemia: Secondary | ICD-10-CM | POA: Diagnosis not present

## 2021-12-26 DIAGNOSIS — Z7982 Long term (current) use of aspirin: Secondary | ICD-10-CM

## 2021-12-26 DIAGNOSIS — A419 Sepsis, unspecified organism: Secondary | ICD-10-CM | POA: Diagnosis not present

## 2021-12-26 DIAGNOSIS — J439 Emphysema, unspecified: Secondary | ICD-10-CM | POA: Diagnosis not present

## 2021-12-26 DIAGNOSIS — Z961 Presence of intraocular lens: Secondary | ICD-10-CM | POA: Diagnosis present

## 2021-12-26 DIAGNOSIS — I201 Angina pectoris with documented spasm: Secondary | ICD-10-CM | POA: Diagnosis not present

## 2021-12-26 DIAGNOSIS — F419 Anxiety disorder, unspecified: Secondary | ICD-10-CM | POA: Diagnosis present

## 2021-12-26 LAB — BLOOD GAS, ARTERIAL
Acid-base deficit: 5.3 mmol/L — ABNORMAL HIGH (ref 0.0–2.0)
Bicarbonate: 23.2 mmol/L (ref 20.0–28.0)
FIO2: 65 %
Mechanical Rate: 16
O2 Saturation: 88.6 %
PEEP: 5 cmH2O
Patient temperature: 37
RATE: 16 resp/min
Spontaneous VT: 400 mL
pCO2 arterial: 58 mmHg — ABNORMAL HIGH (ref 32–48)
pH, Arterial: 7.21 — ABNORMAL LOW (ref 7.35–7.45)
pO2, Arterial: 61 mmHg — ABNORMAL LOW (ref 83–108)

## 2021-12-26 LAB — COMPREHENSIVE METABOLIC PANEL
ALT: 28 U/L (ref 0–44)
AST: 51 U/L — ABNORMAL HIGH (ref 15–41)
Albumin: 3.7 g/dL (ref 3.5–5.0)
Alkaline Phosphatase: 75 U/L (ref 38–126)
Anion gap: 11 (ref 5–15)
BUN: 27 mg/dL — ABNORMAL HIGH (ref 8–23)
CO2: 23 mmol/L (ref 22–32)
Calcium: 8.8 mg/dL — ABNORMAL LOW (ref 8.9–10.3)
Chloride: 103 mmol/L (ref 98–111)
Creatinine, Ser: 1.56 mg/dL — ABNORMAL HIGH (ref 0.44–1.00)
GFR, Estimated: 34 mL/min — ABNORMAL LOW (ref >60)
Glucose, Bld: 112 mg/dL — ABNORMAL HIGH (ref 70–99)
Potassium: 4.6 mmol/L (ref 3.5–5.1)
Sodium: 137 mmol/L (ref 135–145)
Total Bilirubin: 0.5 mg/dL (ref 0.3–1.2)
Total Protein: 7.1 g/dL (ref 6.5–8.1)

## 2021-12-26 LAB — URINALYSIS, COMPLETE (UACMP) WITH MICROSCOPIC
Bilirubin Urine: NEGATIVE
Glucose, UA: NEGATIVE mg/dL
Hgb urine dipstick: NEGATIVE
Ketones, ur: NEGATIVE mg/dL
Leukocytes,Ua: NEGATIVE
Nitrite: NEGATIVE
Protein, ur: 30 mg/dL — AB
Specific Gravity, Urine: 1.014 (ref 1.005–1.030)
pH: 5 (ref 5.0–8.0)

## 2021-12-26 LAB — PROTIME-INR
INR: 1 (ref 0.8–1.2)
Prothrombin Time: 12.9 seconds (ref 11.4–15.2)

## 2021-12-26 LAB — BLOOD GAS, VENOUS
Acid-base deficit: 5.7 mmol/L — ABNORMAL HIGH (ref 0.0–2.0)
Bicarbonate: 25.4 mmol/L (ref 20.0–28.0)
O2 Saturation: 73.8 %
Patient temperature: 37
pCO2, Ven: 78 mmHg (ref 44–60)
pH, Ven: 7.12 — CL (ref 7.25–7.43)
pO2, Ven: 49 mmHg — ABNORMAL HIGH (ref 32–45)

## 2021-12-26 LAB — TROPONIN I (HIGH SENSITIVITY)
Troponin I (High Sensitivity): 320 ng/L (ref <18)
Troponin I (High Sensitivity): 1283 ng/L (ref <18)
Troponin I (High Sensitivity): 6035 ng/L (ref <18)

## 2021-12-26 LAB — CBC WITH DIFFERENTIAL/PLATELET
Abs Immature Granulocytes: 0.06 10*3/uL (ref 0.00–0.07)
Basophils Absolute: 0 10*3/uL (ref 0.0–0.1)
Basophils Relative: 0 %
Eosinophils Absolute: 0 10*3/uL (ref 0.0–0.5)
Eosinophils Relative: 0 %
HCT: 36.2 % (ref 36.0–46.0)
Hemoglobin: 10.8 g/dL — ABNORMAL LOW (ref 12.0–15.0)
Immature Granulocytes: 1 %
Lymphocytes Relative: 4 %
Lymphs Abs: 0.5 10*3/uL — ABNORMAL LOW (ref 0.7–4.0)
MCH: 30.5 pg (ref 26.0–34.0)
MCHC: 29.8 g/dL — ABNORMAL LOW (ref 30.0–36.0)
MCV: 102.3 fL — ABNORMAL HIGH (ref 80.0–100.0)
Monocytes Absolute: 0.7 10*3/uL (ref 0.1–1.0)
Monocytes Relative: 6 %
Neutro Abs: 11.3 10*3/uL — ABNORMAL HIGH (ref 1.7–7.7)
Neutrophils Relative %: 89 %
Platelets: 189 10*3/uL (ref 150–400)
RBC: 3.54 MIL/uL — ABNORMAL LOW (ref 3.87–5.11)
RDW: 13.8 % (ref 11.5–15.5)
WBC: 12.6 10*3/uL — ABNORMAL HIGH (ref 4.0–10.5)
nRBC: 0 % (ref 0.0–0.2)

## 2021-12-26 LAB — RESP PANEL BY RT-PCR (FLU A&B, COVID) ARPGX2
Influenza A by PCR: NEGATIVE
Influenza B by PCR: NEGATIVE
SARS Coronavirus 2 by RT PCR: NEGATIVE

## 2021-12-26 LAB — CK: Total CK: 127 U/L (ref 38–234)

## 2021-12-26 LAB — GLUCOSE, CAPILLARY: Glucose-Capillary: 104 mg/dL — ABNORMAL HIGH (ref 70–99)

## 2021-12-26 LAB — TYPE AND SCREEN
ABO/RH(D): O POS
Antibody Screen: NEGATIVE

## 2021-12-26 LAB — LACTIC ACID, PLASMA
Lactic Acid, Venous: 4.4 mmol/L (ref 0.5–1.9)
Lactic Acid, Venous: 1.7 mmol/L (ref 0.5–1.9)
Lactic Acid, Venous: 2.1 mmol/L (ref 0.5–1.9)

## 2021-12-26 LAB — LIPASE, BLOOD: Lipase: 45 U/L (ref 11–51)

## 2021-12-26 LAB — APTT: aPTT: 32 seconds (ref 24–36)

## 2021-12-26 LAB — MRSA NEXT GEN BY PCR, NASAL: MRSA by PCR Next Gen: NOT DETECTED

## 2021-12-26 LAB — BRAIN NATRIURETIC PEPTIDE: B Natriuretic Peptide: 2045.1 pg/mL — ABNORMAL HIGH (ref 0.0–100.0)

## 2021-12-26 LAB — PROCALCITONIN: Procalcitonin: 2.68 ng/mL

## 2021-12-26 MED ORDER — CHLORHEXIDINE GLUCONATE 0.12% ORAL RINSE (MEDLINE KIT)
15.0000 mL | Freq: Two times a day (BID) | OROMUCOSAL | Status: DC
  Administered 2021-12-26 – 2021-12-27 (×2): 15 mL via OROMUCOSAL
  Filled 2021-12-26: qty 15

## 2021-12-26 MED ORDER — FUROSEMIDE 10 MG/ML IJ SOLN
40.0000 mg | Freq: Two times a day (BID) | INTRAMUSCULAR | Status: DC
  Administered 2021-12-26 – 2021-12-27 (×2): 40 mg via INTRAVENOUS
  Filled 2021-12-26 (×2): qty 4

## 2021-12-26 MED ORDER — ALBUMIN HUMAN 25 % IV SOLN
25.0000 g | Freq: Two times a day (BID) | INTRAVENOUS | Status: DC
Start: 2021-12-26 — End: 2021-12-27
  Administered 2021-12-26 – 2021-12-27 (×2): 25 g via INTRAVENOUS
  Filled 2021-12-26 (×2): qty 100

## 2021-12-26 MED ORDER — MIDAZOLAM-SODIUM CHLORIDE 100-0.9 MG/100ML-% IV SOLN
0.5000 mg/h | INTRAVENOUS | Status: DC
  Administered 2021-12-26: 0.5 mg/h via INTRAVENOUS
  Filled 2021-12-26: qty 100

## 2021-12-26 MED ORDER — IPRATROPIUM-ALBUTEROL 0.5-2.5 (3) MG/3ML IN SOLN
3.0000 mL | Freq: Once | RESPIRATORY_TRACT | Status: AC
  Administered 2021-12-27: 3 mL via RESPIRATORY_TRACT

## 2021-12-26 MED ORDER — DOCUSATE SODIUM 50 MG/5ML PO LIQD
100.0000 mg | Freq: Two times a day (BID) | ORAL | Status: DC
  Administered 2021-12-26 – 2021-12-27 (×2): 100 mg
  Filled 2021-12-26 (×2): qty 10

## 2021-12-26 MED ORDER — DOCUSATE SODIUM 100 MG PO CAPS: 100.0000 mg | ORAL_CAPSULE | Freq: Two times a day (BID) | ORAL | Status: DC | PRN

## 2021-12-26 MED ORDER — MIDAZOLAM HCL 2 MG/2ML IJ SOLN: 1.0000 mg | INTRAMUSCULAR | Status: DC | PRN

## 2021-12-26 MED ORDER — SODIUM CHLORIDE 0.9 % IV BOLUS
1000.0000 mL | Freq: Once | INTRAVENOUS | Status: AC
  Administered 2021-12-26: 1000 mL via INTRAVENOUS

## 2021-12-26 MED ORDER — ROCURONIUM BROMIDE 50 MG/5ML IV SOLN
INTRAVENOUS | Status: AC | PRN
  Administered 2021-12-26: 50 mg via INTRAVENOUS

## 2021-12-26 MED ORDER — POLYETHYLENE GLYCOL 3350 17 G PO PACK: 17.0000 g | PACK | Freq: Every day | ORAL | Status: DC | PRN

## 2021-12-26 MED ORDER — POLYETHYLENE GLYCOL 3350 17 G PO PACK
17.0000 g | PACK | Freq: Every day | ORAL | Status: DC
  Administered 2021-12-27: 17 g
  Filled 2021-12-26: qty 1

## 2021-12-26 MED ORDER — IPRATROPIUM-ALBUTEROL 0.5-2.5 (3) MG/3ML IN SOLN
3.0000 mL | Freq: Once | RESPIRATORY_TRACT | Status: DC
  Filled 2021-12-26: qty 3

## 2021-12-26 MED ORDER — SODIUM CHLORIDE 0.9 % IV SOLN
1.0000 g | INTRAVENOUS | Status: DC
  Administered 2021-12-26 – 2021-12-27 (×2): 1 g via INTRAVENOUS
  Filled 2021-12-26: qty 1
  Filled 2021-12-26 (×2): qty 10

## 2021-12-26 MED ORDER — SODIUM CHLORIDE 0.9 % IV SOLN
500.0000 mg | INTRAVENOUS | Status: DC
  Administered 2021-12-26 – 2021-12-27 (×2): 500 mg via INTRAVENOUS
  Filled 2021-12-26: qty 500
  Filled 2021-12-26 (×2): qty 5

## 2021-12-26 MED ORDER — PANTOPRAZOLE SODIUM 40 MG IV SOLR
40.0000 mg | Freq: Every day | INTRAVENOUS | Status: DC
  Administered 2021-12-26: 40 mg via INTRAVENOUS
  Filled 2021-12-26: qty 10

## 2021-12-26 MED ORDER — PANTOPRAZOLE SODIUM 40 MG IV SOLR
40.0000 mg | Freq: Two times a day (BID) | INTRAVENOUS | Status: DC
  Administered 2021-12-27 – 2021-12-28 (×3): 40 mg via INTRAVENOUS
  Filled 2021-12-26 (×3): qty 10

## 2021-12-26 MED ORDER — ETOMIDATE 2 MG/ML IV SOLN
INTRAVENOUS | Status: AC | PRN
  Administered 2021-12-26: 10 mg via INTRAVENOUS

## 2021-12-26 MED ORDER — DOCUSATE SODIUM 50 MG/5ML PO LIQD
100.0000 mg | Freq: Two times a day (BID) | ORAL | Status: DC | PRN
Start: 2021-12-26 — End: 2021-12-28

## 2021-12-26 MED ORDER — ORAL CARE MOUTH RINSE
15.0000 mL | OROMUCOSAL | Status: DC
  Administered 2021-12-26 – 2021-12-27 (×11): 15 mL via OROMUCOSAL
  Filled 2021-12-26 (×2): qty 15

## 2021-12-26 MED ORDER — IOHEXOL 350 MG/ML SOLN
50.0000 mL | Freq: Once | INTRAVENOUS | Status: AC | PRN
  Administered 2021-12-26: 50 mL via INTRAVENOUS

## 2021-12-26 MED ORDER — SODIUM CHLORIDE 0.9 % IV BOLUS
500.0000 mL | Freq: Once | INTRAVENOUS | Status: AC
  Administered 2021-12-26: 500 mL via INTRAVENOUS

## 2021-12-26 MED ORDER — MIDAZOLAM HCL 2 MG/2ML IJ SOLN
1.0000 mg | INTRAMUSCULAR | Status: AC | PRN
  Administered 2021-12-27: 1 mg via INTRAVENOUS
  Administered 2021-12-27: 2 mg via INTRAVENOUS
  Administered 2021-12-27: 1 mg via INTRAVENOUS
  Filled 2021-12-26 (×3): qty 2

## 2021-12-26 MED ORDER — HYDRALAZINE HCL 20 MG/ML IJ SOLN
10.0000 mg | Freq: Four times a day (QID) | INTRAMUSCULAR | Status: DC | PRN
  Administered 2021-12-26 – 2021-12-28 (×6): 10 mg via INTRAVENOUS
  Filled 2021-12-26 (×6): qty 1

## 2021-12-26 MED ORDER — CHLORHEXIDINE GLUCONATE CLOTH 2 % EX PADS
6.0000 | MEDICATED_PAD | Freq: Every day | CUTANEOUS | Status: DC
  Administered 2021-12-26 – 2021-12-27 (×3): 6 via TOPICAL

## 2021-12-26 MED ORDER — ALBUTEROL SULFATE (2.5 MG/3ML) 0.083% IN NEBU
2.5000 mg | INHALATION_SOLUTION | RESPIRATORY_TRACT | Status: AC
  Administered 2021-12-27: 2.5 mg via RESPIRATORY_TRACT
  Filled 2021-12-26: qty 3

## 2021-12-26 MED ORDER — FENTANYL 2500MCG IN NS 250ML (10MCG/ML) PREMIX INFUSION
0.0000 ug/h | INTRAVENOUS | Status: DC
  Administered 2021-12-26: 25 ug/h via INTRAVENOUS
  Filled 2021-12-26: qty 250

## 2021-12-26 MED ORDER — FENTANYL BOLUS VIA INFUSION
25.0000 ug | INTRAVENOUS | Status: DC | PRN
  Administered 2021-12-27: 25 ug via INTRAVENOUS

## 2021-12-26 NOTE — ED Notes (Signed)
Dentures and yellow-colored necklace with cross given to daughter and taken home.

## 2021-12-26 NOTE — ED Notes (Signed)
Per ICU, RN at lunch and room is being cleaned now.

## 2021-12-26 NOTE — ED Notes (Signed)
Bair hugger placed on pt.

## 2021-12-26 NOTE — ED Notes (Signed)
Attempted to call report to Pippa in ICU. No answer.

## 2021-12-26 NOTE — ED Notes (Signed)
Report given to Pippa, RN in ICU.

## 2021-12-26 NOTE — ED Provider Notes (Signed)
[facility] Provider Note    Event Date/Time   First MD Initiated Contact with Patient 12/26/21 1211     (approximate)   History   Altered Mental Status  EM caveat: Patient unresponsive with respiratory distress  HPI  [name] [name] is a 76 y.o. female on review of primary care note has a history of spondylopathy, anxiety.  I discussed with the patient's daughter Zenda Alpers.  She reports her mom has a history of COPD and yesterday reported she was not having a good day when they conversed on the phone, she was having some difficulty breathing.  Thought possibly due to COPD she has not been having any known flulike symptoms fevers or body aches.  Daughter went to help her today and help walk her dog and found her unresponsive in her bed thus calling EMS.  EMS reports patient found unresponsive atraumatic in her bed.  Difficulty breathing.  Gave 1 nebulizer treatment as well as Solu-Medrol and utilize bag-valve-mask ventilation to maintain oxygen saturations in the high 80s to the ER     Physical Exam   Triage Vital Signs: ED Triage Vitals  Enc Vitals Group     BP      Pulse      Resp      Temp      Temp src      SpO2      Weight      Height      Head Circumference      Peak Flow      Pain Score      Pain Loc      Pain Edu?      Excl. in [place]?     Most recent vital signs: Vitals:   12/26/21 1505 12/26/21 1555  BP: 128/60   Pulse: 82 87  Resp: 17 16  Temp: (!) 97.5 F (36.4 C) 98.6 F (37 C)  SpO2: 92% 92%     General: Obtunded.  Moves minimally, no obvious focal deficits, but appears in respiratory distress with shallow breathing and having to be assisted with bag-valve-mask ventilation with good effect CV:  Good peripheral perfusion.  Strong radial pulse.  No obvious murmurs, slight tachycardia is present Resp:  Diminished lung sounds throughout, weak muscles of breathing, assisting with bag-valve-mask ventilation. Abd:  No  distention.  Other:  No lower extremity edema   ED Results / Procedures / Treatments   Labs (all labs ordered are listed, but only abnormal results are displayed) Labs Reviewed  BLOOD GAS, VENOUS - Abnormal; Notable for the following components:      Result Value   pH, Ven 7.12 (*)    pCO2, Ven 78 (*)    pO2, Ven 49 (*)    Acid-base deficit 5.7 (*)    All other components within normal limits  BLOOD GAS, ARTERIAL - Abnormal; Notable for the following components:   pH, Arterial 7.21 (*)    pCO2 arterial 58 (*)    pO2, Arterial 61 (*)    Acid-base deficit 5.3 (*)    All other components within normal limits  BRAIN NATRIURETIC PEPTIDE - Abnormal; Notable for the following components:   B Natriuretic Peptide 2,045.1 (*)    All other components within normal limits  LACTIC ACID, PLASMA - Abnormal; Notable for the following components:   Lactic Acid, Venous 4.4 (*)    All other components within normal limits  COMPREHENSIVE METABOLIC PANEL - Abnormal; Notable for the following components:  Glucose, Bld 112 (*)    BUN 27 (*)    Creatinine, Ser 1.56 (*)    Calcium 8.8 (*)    AST 51 (*)    GFR, Estimated 34 (*)    All other components within normal limits  URINALYSIS, COMPLETE (UACMP) WITH MICROSCOPIC - Abnormal; Notable for the following components:   Color, Urine YELLOW (*)    APPearance CLOUDY (*)    Protein, ur 30 (*)    Bacteria, UA RARE (*)    All other components within normal limits  CBC WITH DIFFERENTIAL/PLATELET - Abnormal; Notable for the following components:   WBC 12.6 (*)    RBC 3.54 (*)    Hemoglobin 10.8 (*)    MCV 102.3 (*)    MCHC 29.8 (*)    Neutro Abs 11.3 (*)    Lymphs Abs 0.5 (*)    All other components within normal limits  TROPONIN I (HIGH SENSITIVITY) - Abnormal; Notable for the following components:   Troponin I (High Sensitivity) 320 (*)    All other components within normal limits  TROPONIN I (HIGH SENSITIVITY) - Abnormal; Notable for the  following components:   Troponin I (High Sensitivity) 1,283 (*)    All other components within normal limits  RESP PANEL BY RT-PCR (FLU A&B, COVID) ARPGX2  CULTURE, BLOOD (ROUTINE X 2)  CULTURE, BLOOD (ROUTINE X 2)  CULTURE, RESPIRATORY W GRAM STAIN  SURGICAL PCR SCREEN  MRSA NEXT GEN BY PCR, NASAL  PROTIME-INR  APTT  LIPASE, BLOOD  CK  CBC WITH DIFFERENTIAL/PLATELET  LACTIC ACID, PLASMA  LACTIC ACID, PLASMA  PROCALCITONIN  CBG MONITORING, ED  TYPE AND SCREEN  TROPONIN I (HIGH SENSITIVITY)     EKG  Reviewed inter by me at 1215 Heart rate 75 QRS 99 QTc 490 Left ventricular hypertrophy, repolarization abnormality.  Difficult to exclude underlying ischemia.  No STEMI   RADIOLOGY   Personally interpreted patient's chest x-ray as appropriate position of endotracheal tube and orogastric tube postintubation.  Imaging CT of the head as well as CT chest for PE study will be followed up by ICU physician.  Discussed these pending studies with Dr. Lanney Gins who will follow-up. (Patient going from ER to CT then to ICU)    PROCEDURES:  Critical Care performed: Yes, see critical care procedure note(s)  Procedure Name: Intubation Date/Time: 12/26/2021 3:56 PM Performed by: Delman Kitten, MD Pre-anesthesia Checklist: Patient identified, Patient being monitored, Emergency Drugs available, Timeout performed and Suction available Oxygen Delivery Method: Non-rebreather mask Preoxygenation: Pre-oxygenation with 100% oxygen Induction Type: Rapid sequence Ventilation: Mask ventilation without difficulty Laryngoscope Size: Glidescope and 3 Grade View: Grade I Tube type: Subglottic suction tube Tube size: 7.5 mm Number of attempts: 1 Airway Equipment and Method: Patient positioned with wedge pillow Placement Confirmation: ETT inserted through vocal cords under direct vision, CO2 detector and Breath sounds checked- equal and bilateral Secured at: 22 cm Tube secured with: ETT holder  (dentures removed without incidence, left on stretcher) Dental Injury: Teeth and Oropharynx as per pre-operative assessment       MEDICATIONS ORDERED IN ED: Medications  ipratropium-albuterol (DUONEB) 0.5-2.5 (3) MG/3ML nebulizer solution 3 mL (has no administration in time range)  ipratropium-albuterol (DUONEB) 0.5-2.5 (3) MG/3ML nebulizer solution 3 mL (has no administration in time range)  albuterol (PROVENTIL) (2.5 MG/3ML) 0.083% nebulizer solution 2.5 mg (has no administration in time range)  fentaNYL 2529mg in NS 2532m(1018mml) infusion-PREMIX (25 mcg/hr Intravenous New Bag/Given 12/26/21 1400)  midazolam (VERSED) 100 mg/100 mL (  1 mg/mL) premix infusion (0.5 mg/hr Intravenous New Bag/Given 12/26/21 1401)  polyethylene glycol (MIRALAX / GLYCOLAX) packet 17 g (has no administration in time range)  pantoprazole (PROTONIX) injection 40 mg (has no administration in time range)  docusate (COLACE) 50 MG/5ML liquid 100 mg (has no administration in time range)  azithromycin (ZITHROMAX) 500 mg in sodium chloride 0.9 % 250 mL IVPB (has no administration in time range)  cefTRIAXone (ROCEPHIN) 1 g in sodium chloride 0.9 % 100 mL IVPB (has no administration in time range)  Chlorhexidine Gluconate Cloth 2 % PADS 6 each (6 each Topical Given 12/26/21 1553)  chlorhexidine gluconate (MEDLINE KIT) (PERIDEX) 0.12 % solution 15 mL (has no administration in time range)  MEDLINE mouth rinse (has no administration in time range)  etomidate (AMIDATE) injection (10 mg Intravenous Given 12/26/21 1204)  rocuronium (ZEMURON) injection (50 mg Intravenous Given 12/26/21 1204)  sodium chloride 0.9 % bolus 1,000 mL (0 mLs Intravenous Stopped 12/26/21 1347)  sodium chloride 0.9 % bolus 500 mL (0 mLs Intravenous Stopped 12/26/21 1517)  iohexol (OMNIPAQUE) 350 MG/ML injection 50 mL (50 mLs Intravenous Contrast Given 12/26/21 1549)     IMPRESSION / MDM / ASSESSMENT AND PLAN / ED COURSE  I reviewed the triage vital signs  and the nursing notes.                              Differential diagnosis includes, but is not limited to, COPD exacerbation, pulmonary embolism, ACS though EKG does not demonstrate STEMI, pneumothorax, acute chest process, acute toxic or metabolic process etc.  No obvious acute intra-abdominal symptoms.  No traumatic injuries to noted but given severe alteration mental status will obtain imaging the CT of the head.  Also CT chest to rule out PE and further evaluate for etiology of hypoxia.  Patient may also be volume overloaded with significantly elevated BNP as well as elevated troponin.  ED prior to wellness maintaining cardiopulmonary resuscitation, patient intubated without incidence, IV fluids for initial hypotension as well as resuscitation for concerns of severe sepsis  Patient's presentation is most consistent with acute presentation with potential threat to life or bodily function.  The patient is on the cardiac monitor to evaluate for evidence of arrhythmia and/or significant heart rate changes.  Clinical Course as of 12/26/21 1559  Wed Dec 26, 2021  1335 Blood glass suggestive of primary respiratory acidosis which may be chronic along with secondary metabolic acidosis [MQ]    Clinical Course User Index [MQ] Delman Kitten, MD   Laboratory interpretation very much remarkable for leukocytosis, mild anemia, elevated troponin 320 then to 1200, AKI.  Discussed elevated troponin with Dr. Lanney Gins as well.  Patient admitted to critical care medicine service.  I have updated patient's family including the patient's daughter at the bedside understanding the patient's critical illness and need for admission and further care and treatment in the ICU service.  FINAL CLINICAL IMPRESSION(S) / ED DIAGNOSES   Final diagnoses:  Acute hypoxemic respiratory failure ([place])  Wheezing     Rx / DC Orders   ED Discharge Orders     None        Note:  This document was prepared using  Dragon voice recognition software and may include unintentional dictation errors.   Delman Kitten, MD 12/26/21 [phone]

## 2021-12-26 NOTE — Plan of Care (Signed)
  Problem: Clinical Measurements: Goal: Will remain free from infection Outcome: Progressing Goal: Cardiovascular complication will be avoided Outcome: Progressing   Problem: Elimination: Goal: Will not experience complications related to urinary retention Outcome: Progressing   Problem: Health Behavior/Discharge Planning: Goal: Ability to manage health-related needs will improve Outcome: Not Progressing

## 2021-12-26 NOTE — ED Notes (Signed)
ICU MD increased FiO2 from 40% to 60%.

## 2021-12-26 NOTE — ED Triage Notes (Addendum)
Pt BIB [facility] from home. Per EMS, pt found unresponsive in her bed by dog walker who called 911. Pt unresponsive en route to ED. BVM in progress on arrival. Pt received 2 neb tx en route.   85% 15 L NRB 90% BVM 90 NSR   108/60   CBG 153

## 2021-12-26 NOTE — ED Notes (Signed)
After portable Xray was complete, [name] [name] dropping from 100% to 80's. Currently, [name] [name] 86% on vent. Dr. Jacqualine Code and RT aware of pt [name] [name].

## 2021-12-26 NOTE — Consult Note (Signed)
[facility] for Electrolyte Monitoring and Replacement   Recent Labs: Potassium (mmol/L)  Date Value  12/12/2020 4.4  11/04/2013 4.0   Magnesium (mg/dL)  Date Value  10/25/2017 2.3   Calcium (mg/dL)  Date Value  12/12/2020 9.4   Calcium, Total (mg/dL)  Date Value  11/04/2013 8.8   Albumin (g/dL)  Date Value  12/12/2020 4.6   Sodium (mmol/L)  Date Value  12/12/2020 136  11/04/2013 131 (L)   Assessment: Patient is a 76 y/o F with medical history including COPD, HLD, GERD, EtOH use disorder, tobacco use disorder, osteoporosis who was B[facility] 5/31 after being found at home unresponsive. Patient intubated in the ED for hypercarbic respiratory failure. Disposition is the ICU. Pharmacy consulted to assist with electrolyte monitoring and replacement as indicated.  Goal of Therapy:  Electrolytes within normal limits  Plan:  --No electrolyte replacement indicated at this time --Follow-up electrolytes with AM labs tomorrow  Benita Gutter 12/26/2021 1:03 PM

## 2021-12-26 NOTE — ED Notes (Signed)
Pt intubated by Dr. Jacqualine Code. 22 @ lip.

## 2021-12-26 NOTE — H&P (Signed)
CRITICAL CARE PROGRESS NOTE    Name: [name] [name] MRN: [id number] DOB: 1945/09/24     LOS: 0   SUBJECTIVE FINDINGS & SIGNIFICANT EVENTS    Patient description:  This is a 76 yo F with hx of advanced COPD, anxiety disorder, dyslipidemia, osteoporosis, history of previous CVA who came in an unresponsive state.  History was obtained from her daughter who is present during examination.  Daughter states that she was speaking with the patient on the phone and noted labored breathing subsequently she drove over to her home to check on her and found her to be unresponsive.  Daughter was attempting to redirect and arouse patient however was unable to do so and called EMS due to unresponsive state.  In the ED patient was found to be hypoxemic was intubated placed on mechanical ventilation.  She had OG tube placed and noted suctioning of blood.  Her blood work including CBC was abnormal showing a decrement in hemoglobin from baseline of 12.4-10.8 with macrocytic anemia, she does have leukocytosis with white count of 12.6.  Cardiac biomarkers noted to be trending up over 1200, and BNP over 2000.  She had a chest x-ray performed which showed possible left lung pneumonia.  Also had CT head ordered and CT angio of the chest which are in process during my evaluation.  Pulmonary critical care service called for admission to medical intensive care unit.  Lines/tubes : NG/OG Vented/Dual Lumen 16 Fr. Oral Marking at nare/corner of mouth 60 cm (Active)  Tube Position (Required) Marking at nare/corner of mouth 12/26/21 1233  Measurement (cm) (Required) 60 cm 12/26/21 1233  Ongoing Placement Verification (Required) (See row information) Yes 12/26/21 1233  Site Assessment Clean, Dry, Intact;Tape intact 12/26/21 1233  Interventions X-ray 12/26/21  1233  Status Low continuous suction 12/26/21 1233  Amount of suction 80 mmHg 12/26/21 1233     Urethral Catheter Jacque RN Temperature probe 16 Fr. (Active)  Indication for Insertion or Continuance of Catheter Unstable critically ill patients first 24-48 hours (See Criteria) 12/26/21 1237  Site Assessment Clean, Dry, Intact 12/26/21 1237  Catheter Maintenance Bag below level of bladder;Catheter secured;Drainage bag/tubing not touching floor;No dependent loops;Seal intact 12/26/21 1237  Collection Container Standard drainage bag 12/26/21 1237  Securement Method Securing device (Describe) 12/26/21 1237    Microbiology/Sepsis markers: Results for orders placed or performed in visit on 02/11/19  Novel Coronavirus, NAA ([facility])     Status: None   Collection Time: 02/11/19  9:07 AM  Result Value Ref Range Status   SARS-CoV-2, NAA Not Detected Not Detected Final    Comment: Testing was performed using the Aptima SARS-CoV-2 assay. This test was developed and its performance characteristics determined by [employer]. This test has not been FDA cleared or approved. This test has been authorized by FDA under an Emergency Use Authorization (EUA). This test is only authorized for the duration of time the declaration that circumstances exist justifying the authorization of the emergency use of in vitro diagnostic tests for detection of SARS-CoV-2 virus and/or diagnosis of COVID-19 infection under section 564(b)(1) of the Act, 21 U.S.C. 779TJQ-3(E)(0), unless the authorization is terminated or revoked sooner. When diagnostic testing is negative, the possibility of a false negative result should be considered in the context of a patient's recent exposures and the presence of clinical signs and symptoms consistent with COVID-19. An individual without symptoms of COVID-19 and who is not shedding SARS-CoV-2 virus would expect to have a negativ e (not detected) result in  this assay.      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  Some speech deficit.   Wears dentures    full upper and lower     SURGICAL HISTORY   Past Surgical History:  Procedure Laterality Date   ABDOMINAL HYSTERECTOMY  1982   BACK SURGERY     CATARACT EXTRACTION W/PHACO Left 09/24/2021   Procedure: CATARACT EXTRACTION PHACO AND INTRAOCULAR LENS PLACEMENT ([place]) LEFT;  Surgeon: Eulogio Bear, MD;  Location: [facility];  Service: Ophthalmology;  Laterality: Left;  3.80 0:31.7   CATARACT EXTRACTION W/PHACO Right 10/08/2021   Procedure: CATARACT EXTRACTION PHACO AND INTRAOCULAR LENS PLACEMENT ([facility]) RIGHT 3.84 00:35.3;  Surgeon: Eulogio Bear, MD;  Location: [facility];  Service: Ophthalmology;  Laterality: Right;   COLONOSCOPY WITH PROPOFOL N/A 04/07/2018   Procedure: COLONOSCOPY WITH PROPOFOL;  Surgeon: [name], MD;  Location: [facility] ENDOSCOPY;  Service: Endoscopy;  Laterality: N/A;   KYPHOPLASTY N/A 10/23/2017   Procedure: [id number];  Surgeon: Hessie Knows, MD;  Location: [facility] ORS;  Service: Orthopedics;  Laterality: N/A;     FAMILY HISTORY   Family History  Problem Relation Age of Onset   Leukemia Mother    Aneurysm Father    Diabetes Father    Heart disease Father    Liver cancer Sister    Cancer Sister    Lung cancer Sister        metastatic   Diabetes Maternal Grandmother    Diabetes Paternal Grandmother      SOCIAL HISTORY   Social History   Tobacco Use   Smoking status: Every Day    Packs/day: 0.50    Years: 53.00    Pack  years: 26.50    Types: Cigarettes   Smokeless tobacco: Never   Tobacco comments:    Since age 69  Vaping Use   Vaping Use: Never used  Substance Use Topics   Alcohol use: Not Currently    Alcohol/week: 2.0 standard drinks    Types: 2 Glasses of wine per week   Drug use: Not Currently    Types: Marijuana    Comment: in the past     MEDICATIONS   Current Medication:  Current Facility-Administered Medications:    albuterol (PROVENTIL) (2.5 MG/3ML) 0.083% nebulizer solution 2.5 mg, 2.5 mg, Nebulization, STAT, Quale, Mark, MD   docusate (COLACE) 50 MG/5ML liquid 100 mg, 100 mg, Per Tube, BID PRN, Ottie Glazier, MD   fentaNYL 2565mg in NS 2564m(1025mml) infusion-PREMIX, 0-400 mcg/hr, Intravenous, Continuous, Quale, Mark, MD   ipratropium-albuterol (DUONEB) 0.5-2.5 (3) MG/3ML nebulizer solution 3 mL, 3 mL, Nebulization, Once, QuaDelman KittenD   ipratropium-albuterol (DUONEB) 0.5-2.5 (3) MG/3ML nebulizer solution 3 mL, 3 mL, Nebulization, Once, QuaDelman KittenD   midazolam (VERSED) 100 mg/100 mL (1 mg/mL) premix infusion, 0.5-10 mg/hr, Intravenous, Continuous, Quale, Mark, MD   pantoprazole (PROTONIX) injection 40 mg, 40 mg, Intravenous, QHS, Arnell Mausolf, MD   polyethylene glycol (MIRALAX / GLYCOLAX) packet 17 g, 17 g,  Per Tube, Daily PRN, Ottie Glazier, MD  Current Outpatient Medications:    albuterol (VENTOLIN HFA) 108 (90 Base) MCG/ACT inhaler, Inhale 2 puffs into the lungs every 4 (four) hours as needed for wheezing or shortness of breath., Disp: 18 g, Rfl: 5   ALPRAZolam (XANAX) 0.5 MG tablet, Take 1 tablet (0.5 mg total) by mouth 3 (three) times daily as needed for anxiety., Disp: 90 tablet, Rfl: 2   amLODipine (NORVASC) 5 MG tablet, TAKE 1 TABLET(5 MG) BY MOUTH DAILY, Disp: 90 tablet, Rfl: 3   aspirin 81 MG tablet, Take 81 mg by mouth daily., Disp: , Rfl:    Calcium 600-200 MG-UNIT tablet, Take 1 tablet by mouth daily., Disp: , Rfl:    cholecalciferol (VITAMIN D) 25 MCG  (1000 UNIT) tablet, Take 1,000 Units by mouth daily., Disp: , Rfl:    citalopram (CELEXA) 40 MG tablet, TAKE 1 TABLET(40 MG) BY MOUTH DAILY, Disp: 30 tablet, Rfl: 3   docusate sodium (COLACE) 100 MG capsule, Take 100 mg by mouth 2 (two) times daily as needed for mild constipation., Disp: , Rfl:    furosemide (LASIX) 20 MG tablet, Take 20 mg by mouth daily as needed for fluid. , Disp: , Rfl:    gabapentin (NEURONTIN) 600 MG tablet, Take 1 tablet (600 mg total) by mouth 3 (three) times daily., Disp: 270 tablet, Rfl: 1   HYDROcodone-acetaminophen (NORCO/VICODIN) 5-325 MG tablet, Take 1 tablet by mouth every 6 (six) hours as needed for moderate pain., Disp: 120 tablet, Rfl: 0   lisinopril (ZESTRIL) 40 MG tablet, TAKE 1 TABLET(40 MG) BY MOUTH DAILY, Disp: 90 tablet, Rfl: 0   meloxicam (MOBIC) 7.5 MG tablet, Take 1 tablet (7.5 mg total) by mouth daily as needed for pain (arthritis)., Disp: 90 tablet, Rfl: 1   metoprolol succinate (TOPROL-XL) 25 MG 24 hr tablet, TAKE 1 TABLET(25 MG) BY MOUTH DAILY, Disp: 90 tablet, Rfl: 3   pantoprazole (PROTONIX) 20 MG tablet, TAKE 1 TABLET(20 MG) BY MOUTH DAILY, Disp: 90 tablet, Rfl: 1   potassium chloride SA (K-DUR,KLOR-CON) 20 MEQ tablet, Take 1 tablet (20 mEq total) by mouth daily., Disp: 90 tablet, Rfl: 1   Propylene Glycol (SYSTANE COMPLETE OP), Apply to eye., Disp: , Rfl:    simvastatin (ZOCOR) 10 MG tablet, TAKE 1 TABLET(10 MG) BY MOUTH DAILY, Disp: 90 tablet, Rfl: 3   Tiotropium Bromide Monohydrate (SPIRIVA RESPIMAT) 1.25 MCG/ACT AERS, Inhale 1 puff into the lungs daily., Disp: 4 g, Rfl: 5    ALLERGIES   Patient has no known allergies.    REVIEW OF SYSTEMS    Unable to obtain due to acutely comatose state on mechanical ventilation  PHYSICAL EXAMINATION   Vital Signs: Temp:  [92.4 F (33.6 C)-95.1 F (35.1 C)] 95.1 F (35.1 C) (05/31 1250) Pulse Rate:  [76-80] 76 (05/31 1250) Resp:  [15-19] 16 (05/31 1250) BP: (100-137)/(56-65) 117/56 (05/31  1250) SpO2:  [85 %-99 %] 88 % (05/31 1250) FiO2 (%):  [60 %] 60 % (05/31 1250)  GENERAL: Age-appropriate intubated on mechanical ventilation HEAD: Normocephalic, atraumatic.  EYES: Pupils equal, round, reactive to light.  No scleral icterus.  MOUTH: Moist mucosal membrane. NECK: Supple. No thyromegaly. No nodules. No JVD.  PULMONARY: Crackles bilaterally worse on the left CARDIOVASCULAR: S1 and S2. Regular rate and rhythm. No murmurs, rubs, or gallops.  GASTROINTESTINAL: Soft, nontender, non-distended. No masses. Positive bowel sounds. No hepatosplenomegaly.  MUSCULOSKELETAL: No swelling, clubbing, or edema.  NEUROLOGIC: GCS 4T SKIN:intact,warm,dry   PERTINENT DATA  Infusions:  fentaNYL infusion INTRAVENOUS     midazolam     Scheduled Medications:  albuterol  2.5 mg Nebulization STAT   ipratropium-albuterol  3 mL Nebulization Once   ipratropium-albuterol  3 mL Nebulization Once   pantoprazole (PROTONIX) IV  40 mg Intravenous QHS   PRN Medications: docusate, polyethylene glycol Hemodynamic parameters:   Intake/Output: No intake/output data recorded.  Ventilator  Settings: FiO2 (%):  [60 %] 60 %    LAB RESULTS:  Basic Metabolic Panel: No results for input(s): NA, K, CL, CO2, GLUCOSE, BUN, CREATININE, CALCIUM, MG, PHOS in the last 168 hours. Liver Function Tests: No results for input(s): AST, ALT, ALKPHOS, BILITOT, PROT, ALBUMIN in the last 168 hours. No results for input(s): LIPASE, AMYLASE in the last 168 hours. No results for input(s): AMMONIA in the last 168 hours. CBC: No results for input(s): WBC, NEUTROABS, HGB, HCT, MCV, PLT in the last 168 hours. Cardiac Enzymes: No results for input(s): CKTOTAL, CKMB, CKMBINDEX, TROPONINI in the last 168 hours. BNP: Invalid input(s): POCBNP CBG: No results for input(s): GLUCAP in the last 168 hours.     IMAGING RESULTS:  Imaging: DG Abdomen 1 View  Result Date: 12/26/2021 CLINICAL DATA:  Orogastric tube  placement EXAM: ABDOMEN - 1 VIEW COMPARISON:  Chest radiograph 12/26/2021 and MRI abdomen 08/31/2020 FINDINGS: Pulmonary nodule versus nipple shadows in the lungs. Emphysema. Extensive atherosclerosis. Aortic Atherosclerosis (ICD10-I70.0). Possible left pleural thickening at the lung base, cannot exclude pleural effusion. L1 vertebral augmentation. Bony demineralization. The orogastric tube tip is in the stomach body with side port in the stomach body. IMPRESSION: 1. Orogastric tube tip and side port in the stomach body. 2. Possible left pleural effusion. 3. Emphysema and interstitial accentuation in the lungs. 4. Aortic Atherosclerosis (ICD10-I70.0) and Emphysema (ICD10-J43.9). 5. Nodularity in the mid lungs, nipple shadows versus true pulmonary nodules. Electronically Signed   By: Van Clines M.D.   On: 12/26/2021 12:52   DG Chest Port 1 View  Result Date: 12/26/2021 CLINICAL DATA:  Questionable sepsis. EXAM: PORTABLE CHEST 1 VIEW COMPARISON:  Radiograph January 14, 2018 FINDINGS: Endotracheal tube with tip projecting over the midthoracic trachea at the level of the aortic arch. Nasogastric tube courses below the diaphragm with tip and side port obscured by collimation. Additional extracorporeal leads overlie the chest and upper abdomen. Enlarged cardiac silhouette with central vascular prominence. Opacity in the left lung base with partial obscuration of left hemidiaphragm. Chronic bronchitic lung changes. No acute osseous abnormality. IMPRESSION: Opacity in the left lung base with partial obscuration of left hemidiaphragm. Could reflect an effusion and/or atelectasis/pneumonia. Electronically Signed   By: Dahlia Bailiff M.D.   On: 12/26/2021 12:53   @PROBHOSP @ DG Abdomen 1 View  Result Date: 12/26/2021 CLINICAL DATA:  Orogastric tube placement EXAM: ABDOMEN - 1 VIEW COMPARISON:  Chest radiograph 12/26/2021 and MRI abdomen 08/31/2020 FINDINGS: Pulmonary nodule versus nipple shadows in the lungs.  Emphysema. Extensive atherosclerosis. Aortic Atherosclerosis (ICD10-I70.0). Possible left pleural thickening at the lung base, cannot exclude pleural effusion. L1 vertebral augmentation. Bony demineralization. The orogastric tube tip is in the stomach body with side port in the stomach body. IMPRESSION: 1. Orogastric tube tip and side port in the stomach body. 2. Possible left pleural effusion. 3. Emphysema and interstitial accentuation in the lungs. 4. Aortic Atherosclerosis (ICD10-I70.0) and Emphysema (ICD10-J43.9). 5. Nodularity in the mid lungs, nipple shadows versus true pulmonary nodules. Electronically Signed   By: Van Clines M.D.   On: 12/26/2021 12:52   DG  Chest Port 1 View  Result Date: 12/26/2021 CLINICAL DATA:  Questionable sepsis. EXAM: PORTABLE CHEST 1 VIEW COMPARISON:  Radiograph January 14, 2018 FINDINGS: Endotracheal tube with tip projecting over the midthoracic trachea at the level of the aortic arch. Nasogastric tube courses below the diaphragm with tip and side port obscured by collimation. Additional extracorporeal leads overlie the chest and upper abdomen. Enlarged cardiac silhouette with central vascular prominence. Opacity in the left lung base with partial obscuration of left hemidiaphragm. Chronic bronchitic lung changes. No acute osseous abnormality. IMPRESSION: Opacity in the left lung base with partial obscuration of left hemidiaphragm. Could reflect an effusion and/or atelectasis/pneumonia. Electronically Signed   By: Dahlia Bailiff M.D.   On: 12/26/2021 12:53        ASSESSMENT AND PLAN    -Multidisciplinary rounds held today  Acute Hypoxic Respiratory Failure -Present on admission unclear if this is due to CHF exacerbation versus pneumonia. -She did have copious amounts of blood aspirated from the stomach and may have aspirated blood into the lungs if there is a GI bleed, CBC shows decrement in hemoglobin from baseline 14-12 -Empirically will treat with Zithromax  and Rocephin -Procalcitonin trending and MRSA PCR screen Blood cultures x2 -continue Full MV support -continue Bronchodilator Therapy -Wean Fio2 and PEEP as tolerated -will perform SAT/SBT when respiratory parameters are met   NSTEMI    - with non-rheumatic MR/TR as well as possible new onset CHF   - cardiology consultation -previously seen by Dr.  Saralyn Pilar    - holding anticoagulation due to acute blood loss anemia with GI bleed per OGT -Cardiac biomarkers trending ICU telemetry monitoring  Renal Failure-most likely due to ATN -follow chem 7 -follow UO -continue Foley Catheter-assess need daily  ID -continue IV abx as prescibed -follow up cultures  GI/Nutrition GI PROPHYLAXIS as indicated DIET-->TF's as tolerated Constipation protocol as indicated  ENDO - ICU hypoglycemic\Hyperglycemia protocol -check FSBS per protocol   ELECTROLYTES -follow labs as needed -replace as needed -pharmacy consultation   DVT/GI PRX ordered -SCDs  TRANSFUSIONS AS NEEDED MONITOR FSBS ASSESS the need for LABS as needed   Critical care provider statement:    Critical care provider statement:   Total critical care time: 109 minutes   Performed by: Lanney Gins MD   Critical care time was exclusive of separately billable procedures and treating other patients.   Critical care was necessary to treat or prevent imminent or life-threatening deterioration.   Critical care was time spent personally by me on the following activities: development of treatment plan with patient and/or surrogate as well as nursing, discussions with consultants, evaluation of patient's response to treatment, examination of patient, obtaining history from patient or surrogate, ordering and performing treatments and interventions, ordering and review of laboratory studies, ordering and review of radiographic studies, pulse oximetry and re-evaluation of patient's condition.    Ottie Glazier, M.D.  Pulmonary &  Critical Care Medicine

## 2021-12-26 NOTE — ED Notes (Signed)
Two sets of cultures, lactic, full rainbow sent down to lab

## 2021-12-26 NOTE — Consult Note (Signed)
[facility] Cardiology  CARDIOLOGY CONSULT NOTE  Patient ID: [name] [name] MRN: [id number] DOB/AGE: [id number] 75 y.o.  Admit date: 12/26/2021 Referring Physician [facility] Primary Physician  Primary Cardiologist  Reason for Consultation elevated troponin  HPI: 76 year old female furred for evaluation of elevated troponin.  She has a history of advanced COPD and prior CVA.  Patient was brought to [facility] ED in an unresponsive state.  Cording to the daughter, she was speaking with the patient on the phone, when the patient became short of breath.  Went to check on her, at which time the patient was unresponsive, EMS was called, and was brought to [facility] ED she was noted to be hypoxic, intubated and placed on mechanical ventilator.  Chest x-ray revealed possible left lung pneumonia.  Chest CT did not reveal evidence for pulmonary embolus.  CT scan did reveal left lung dense atelectasis left lower lobe which may have been caused by mucous plugging, a new 12 mm pleural-based noncalcified density posterior left upper lung suggesting focal infectious process.  Head CT did not reveal any acute intracranial abnormality.  Admission labs revealed BUN and creatinine of 27 and 1.56, with GFR 34.  BNP was 2045, high-sensitivity troponin was elevated (320, 1283).  ECG revealed sinus rhythm with associated ST-T wave abnormalities which appeared unchanged compared to prior ECG from 01/14/2018.  Of note, OG tube was placed, and suctioning of blood was noted.  Hemoglobin and hematocrit were 10.8 and 36.2, respectively.  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  Some speech deficit.   Wears dentures    full upper and lower    Past Surgical History:  Procedure Laterality Date   ABDOMINAL HYSTERECTOMY  1982   BACK SURGERY     CATARACT EXTRACTION W/PHACO Left 09/24/2021   Procedure: CATARACT EXTRACTION PHACO AND INTRAOCULAR LENS PLACEMENT ([place]) LEFT;  Surgeon: Eulogio Bear, MD;  Location: [place];  Service: Ophthalmology;  Laterality: Left;  3.80 0:31.7   CATARACT EXTRACTION W/PHACO Right 10/08/2021   Procedure: CATARACT EXTRACTION PHACO AND INTRAOCULAR LENS PLACEMENT (I[facility]) RIGHT 3.84 00:35.3;  Surgeon: Eulogio Bear, MD;  Location: [place];  Service: Ophthalmology;  Laterality: Right;   COLONOSCOPY WITH PROPOFOL N/A 04/07/2018   Procedure: COLONOSCOPY WITH PROPOFOL;  Surgeon: [name], MD;  Location: [facility] ENDOSCOPY;  Service: Endoscopy;  Laterality: N/A;   KYPHOPLASTY N/A 10/23/2017   Procedure: [id number];  Surgeon: Hessie Knows, MD;  Location: [facility] ORS;  Service: Orthopedics;  Laterality: N/A;    Medications Prior to Admission  Medication Sig Dispense Refill Last Dose   albuterol (VENTOLIN HFA) 108 (90 Base) MCG/ACT inhaler Inhale 2 puffs into the lungs every 4 (four) hours as needed for wheezing or shortness of breath. 18 g 5    ALPRAZolam (XANAX) 0.5 MG tablet Take 1 tablet (0.5 mg total) by mouth 3 (three) times daily as needed for anxiety. 90 tablet 2    amLODipine (NORVASC) 5 MG tablet TAKE 1 TABLET(5 MG) BY MOUTH DAILY 90 tablet 3    aspirin 81  MG tablet Take 81 mg by mouth daily.      Calcium 600-200 MG-UNIT tablet Take 1 tablet by mouth daily.      cholecalciferol (VITAMIN D) 25 MCG (1000 UNIT) tablet Take 1,000 Units by mouth daily.      citalopram (CELEXA) 40 MG tablet TAKE 1 TABLET(40 MG) BY MOUTH DAILY 30 tablet 3    docusate sodium (COLACE) 100 MG capsule Take 100 mg by mouth 2 (two) times daily as needed for mild constipation.      furosemide (LASIX) 20 MG tablet Take 20 mg  by mouth daily as needed for fluid.       gabapentin (NEURONTIN) 600 MG tablet Take 1 tablet (600 mg total) by mouth 3 (three) times daily. 270 tablet 1    HYDROcodone-acetaminophen (NORCO/VICODIN) 5-325 MG tablet Take 1 tablet by mouth every 6 (six) hours as needed for moderate pain. 120 tablet 0    lisinopril (ZESTRIL) 40 MG tablet TAKE 1 TABLET(40 MG) BY MOUTH DAILY 90 tablet 0    meloxicam (MOBIC) 7.5 MG tablet Take 1 tablet (7.5 mg total) by mouth daily as needed for pain (arthritis). 90 tablet 1    metoprolol succinate (TOPROL-XL) 25 MG 24 hr tablet TAKE 1 TABLET(25 MG) BY MOUTH DAILY 90 tablet 3    pantoprazole (PROTONIX) 20 MG tablet TAKE 1 TABLET(20 MG) BY MOUTH DAILY 90 tablet 1    potassium chloride SA (K-DUR,KLOR-CON) 20 MEQ tablet Take 1 tablet (20 mEq total) by mouth daily. 90 tablet 1    Propylene Glycol (SYSTANE COMPLETE OP) Apply to eye.      simvastatin (ZOCOR) 10 MG tablet TAKE 1 TABLET(10 MG) BY MOUTH DAILY 90 tablet 3    Tiotropium Bromide Monohydrate (SPIRIVA RESPIMAT) 1.25 MCG/ACT AERS Inhale 1 puff into the lungs daily. 4 g 5    Social History   Socioeconomic History   Marital status: Widowed    Spouse name: Not on file   Number of children: 1   Years of education: Not on file   Highest education level: Some college, no degree  Occupational History    Employer: RETIRED  Tobacco Use   Smoking status: Every Day    Packs/day: 0.50    Years: 53.00    Pack years: 26.50    Types: Cigarettes   Smokeless tobacco: Never   Tobacco comments:    Since age 57  Vaping Use   Vaping Use: Never used  Substance and Sexual Activity   Alcohol use: Not Currently    Alcohol/week: 2.0 standard drinks    Types: 2 Glasses of wine per week   Drug use: Not Currently    Types: Marijuana    Comment: in the past   Sexual activity: Not on file  Other Topics Concern   Not on file  Social History Narrative   Not on file   Social Determinants of Health   Financial Resource  Strain: Low Risk    Difficulty of Paying Living Expenses: Not hard at all  Food Insecurity: No Food Insecurity   Worried About Charity fundraiser in the Last Year: Never true   Ran Out of Food in the Last Year: Never true  Transportation Needs: No Transportation Needs   Lack of Transportation (Medical): No   Lack of Transportation (Non-Medical): No  Physical Activity: Inactive   Days of Exercise per Week: 0 days   Minutes of Exercise per Session: 0 min  Stress: Stress Concern Present   Feeling of  Stress : Rather much  Social Connections: Socially Isolated   Frequency of Communication with Friends and Family: More than three times a week   Frequency of Social Gatherings with Friends and Family: Never   Attends Religious Services: Never   Marine scientist or Organizations: No   Attends Archivist Meetings: Never   Marital Status: Widowed  Human resources officer Violence: Not on file    Family History  Problem Relation Age of Onset   Leukemia Mother    Aneurysm Father    Diabetes Father    Heart disease Father    Liver cancer Sister    Cancer Sister    Lung cancer Sister        metastatic   Diabetes Maternal Grandmother    Diabetes Paternal Grandmother       Review of systems complete and found to be negative unless listed above      PHYSICAL EXAM  General: Well developed, well nourished, in no acute distress HEENT:  Normocephalic and atramatic Neck:  No JVD.  Lungs: Clear bilaterally to auscultation and percussion. Heart: HRRR . Normal S1 and S2 without gallops or murmurs.  Abdomen: Bowel sounds are positive, abdomen soft and non-tender  Msk:  Back normal, normal gait. Normal strength and tone for age. Extremities: No clubbing, cyanosis or edema.   Neuro: Alert and oriented X 3. Psych:  Good affect, responds appropriately  Labs:   Lab Results  Component Value Date   WBC 12.6 (H) 12/26/2021   HGB 10.8 (L) 12/26/2021   HCT 36.2 12/26/2021   MCV  102.3 (H) 12/26/2021   PLT 189 12/26/2021    Recent Labs  Lab 12/26/21 1202  NA 137  K 4.6  CL 103  CO2 23  BUN 27*  CREATININE 1.56*  CALCIUM 8.8*  PROT 7.1  BILITOT 0.5  ALKPHOS 75  ALT 28  AST 51*  GLUCOSE 112*   Lab Results  Component Value Date   CKTOTAL 127 12/26/2021   TROPONINI 0.03 (HH) 01/14/2018    Lab Results  Component Value Date   CHOL 142 12/12/2020   CHOL 192 09/26/2017   CHOL 151 08/28/2016   Lab Results  Component Value Date   HDL 61 12/12/2020   HDL 53 09/26/2017   HDL 62 08/28/2016   Lab Results  Component Value Date   LDLCALC 62 12/12/2020   LDLCALC 106 (H) 09/26/2017   LDLCALC 68 08/28/2016   Lab Results  Component Value Date   TRIG 103 12/12/2020   TRIG 164 (H) 09/26/2017   TRIG 107 08/28/2016   Lab Results  Component Value Date   CHOLHDL 2.3 12/12/2020   CHOLHDL 3.6 09/26/2017   CHOLHDL 2.4 08/28/2016   No results found for: LDLDIRECT    Radiology: DG Abdomen 1 View  Result Date: 12/26/2021 CLINICAL DATA:  Orogastric tube placement EXAM: ABDOMEN - 1 VIEW COMPARISON:  Chest radiograph 12/26/2021 and MRI abdomen 08/31/2020 FINDINGS: Pulmonary nodule versus nipple shadows in the lungs. Emphysema. Extensive atherosclerosis. Aortic Atherosclerosis (ICD10-I70.0). Possible left pleural thickening at the lung base, cannot exclude pleural effusion. L1 vertebral augmentation. Bony demineralization. The orogastric tube tip is in the stomach body with side port in the stomach body. IMPRESSION: 1. Orogastric tube tip and side port in the stomach body. 2. Possible left pleural effusion. 3. Emphysema and interstitial accentuation in the lungs. 4. Aortic Atherosclerosis (ICD10-I70.0) and Emphysema (ICD10-J43.9). 5. Nodularity in the mid lungs, nipple shadows versus true pulmonary nodules. Electronically Signed  By: Van Clines M.D.   On: 12/26/2021 12:52   CT Head Wo Contrast  Result Date: 12/26/2021 CLINICAL DATA:  Mental status change,  persistent or worsening, intubated, ams EXAM: CT HEAD WITHOUT CONTRAST TECHNIQUE: Contiguous axial images were obtained from the base of the skull through the vertex without intravenous contrast. RADIATION DOSE REDUCTION: This exam was performed according to the departmental dose-optimization program which includes automated exposure control, adjustment of the mA and/or kV according to patient size and/or use of iterative reconstruction technique. COMPARISON:  2017 FINDINGS: Brain: There is no acute intracranial hemorrhage, mass effect, or edema. Gray-white differentiation is preserved. There is no extra-axial fluid collection. Prominence of the ventricles and sulci reflects minor parenchymal volume loss. Minimal patchy hypoattenuation in the supratentorial white matter is nonspecific but may reflect minor chronic microvascular ischemic changes. Chronic small vessel infarct the posterolateral right putamen. Vascular: There is atherosclerotic calcification at the skull base. Skull: Calvarium is unremarkable. Sinuses/Orbits: No acute finding. Other: None. IMPRESSION: No acute intracranial abnormality. Electronically Signed   By: Macy Mis M.D.   On: 12/26/2021 15:59   CT Angio Chest PE W and/or Wo Contrast  Result Date: 12/26/2021 CLINICAL DATA:  High clinical suspicion for pulmonary embolism EXAM: CT ANGIOGRAPHY CHEST WITH CONTRAST TECHNIQUE: Multidetector CT imaging of the chest was performed using the standard protocol during bolus administration of intravenous contrast. Multiplanar CT image reconstructions and MIPs were obtained to evaluate the vascular anatomy. RADIATION DOSE REDUCTION: This exam was performed according to the departmental dose-optimization program which includes automated exposure control, adjustment of the mA and/or kV according to patient size and/or use of iterative reconstruction technique. CONTRAST:  13m OMNIPAQUE IOHEXOL 350 MG/ML SOLN COMPARISON:  Chest radiograph done earlier  today and CT chest done on 05/23/2020 FINDINGS: Cardiovascular: Coronary artery calcifications are seen. Dense calcification is seen in the mitral annulus. Contrast density in the thoracic aorta is less than adequate to evaluate the lumen. There is ectasia of ascending thoracic aorta measuring 3.5 cm. There is focal aneurysmal dilation in the proximal descending thoracic aorta measuring 4.2 cm in AP diameter which measured 4 cm in the previous study. Aortic arch measures 3 cm. Distal thoracic aorta measures 2.9 cm. There is dense calcification filling much of the lumen of proximal abdominal aorta. Mediastinum/Nodes: There are calcified nodules in the mediastinum. Enteric tube is noted traversing the esophagus. Tip of endotracheal tube is at the level of aortic arch. Lungs/Pleura: Severe centrilobular and panlobular emphysema is seen. There is decreased volume in the left lung. There is dense atelectasis in the left lower lobe. There are faint ground-glass densities in the left upper lobe. Small patchy infiltrates are seen in the right lower lobe. In image 20 of series 7 there is 12 mm pleural-based nodular density in the posterior left upper lung fields which was not seen in the previous examination. Small to moderate bilateral pleural effusions are seen. There is no pneumothorax. Upper Abdomen: Distal portion of enteric tube is seen in the stomach. Musculoskeletal: There is decrease in height of few of the thoracic vertebral bodies and L1 vertebral body. There is previous vertebroplasty in the L1 vertebra. These findings have not changed significantly. Review of the MIP images confirms the above findings. IMPRESSION: There is no evidence of pulmonary artery embolism. There is decreased volume in the left lung with dense atelectasis in the left lower lobe. This may be caused by mucous plugging in the airways or central obstructing neoplastic process. Follow-up CT and  endoscopy as warranted should be considered.  Small patchy infiltrates are seen in the right lower lung fields suggesting subsegmental atelectasis. Small to moderate bilateral pleural effusions are seen. There is a new 12 mm pleural-based noncalcified density in the posterior left upper lung fields. This may suggest focal infectious process or scarring or neoplasm. This finding could also be re-evaluated in follow-up CT. 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 Extensive atherosclerotic changes are noted. There is a large calcific density filling much of the lumen of proximal abdominal aorta. Possibility of high-grade stenosis is not excluded. There is no adequate contrast enhancement in the systemic circulation limiting evaluation. Severe centrilobular and panlobular emphysema. Coronary artery calcifications are seen. Other findings as described in the body of the report. Electronically Signed   By: Elmer Picker M.D.   On: 12/26/2021 16:19   DG Chest Port 1 View  Result Date: 12/26/2021 CLINICAL DATA:  Questionable sepsis. EXAM: PORTABLE CHEST 1 VIEW COMPARISON:  Radiograph January 14, 2018 FINDINGS: Endotracheal tube with tip projecting over the midthoracic trachea at the level of the aortic arch. Nasogastric tube courses below the diaphragm with tip and side port obscured by collimation. Additional extracorporeal leads overlie the chest and upper abdomen. Enlarged cardiac silhouette with central vascular prominence. Opacity in the left lung base with partial obscuration of left hemidiaphragm. Chronic bronchitic lung changes. No acute osseous abnormality. IMPRESSION: Opacity in the left lung base with partial obscuration of left hemidiaphragm. Could reflect an effusion and/or atelectasis/pneumonia. Electronically Signed   By: Dahlia Bailiff M.D.   On: 12/26/2021 12:53    EKG: Sinus rhythm with LVH and associated ST-T wave abnormalities  ASSESSMENT AND PLAN:   1.  Elevated troponin, in the absence of chest pain or new  ECG changes, in the setting of respiratory failure, possible pneumonia, COPD, likely demand supply ischemia 2.  Respiratory failure, possible pneumonia and patient with known danced COPD, intubated 3.  Acute kidney injury, BUN and creatinine 27 and 1.56, respectively with GFR 34, likely secondary to ATN  Recommendations  1.  Agree with current therapy 2.  Defer full dose anticoagulation at this time 3.  Reviewed 2D echocardiogram 4.  Further recommendations pending 2D echocardiogram results  Signed: Isaias Cowman MD,PhD, [facility] 12/26/2021, 4:22 PM

## 2021-12-27 ENCOUNTER — Inpatient Hospital Stay: Payer: Medicare Other

## 2021-12-27 ENCOUNTER — Other Ambulatory Visit: Payer: Self-pay

## 2021-12-27 ENCOUNTER — Inpatient Hospital Stay
Admit: 2021-12-27 | Discharge: 2021-12-27 | Disposition: A | Discharge status: Home / Self Care | Payer: Medicare Other | Attending: Cardiology | Admitting: Cardiology

## 2021-12-27 DIAGNOSIS — E43 Unspecified severe protein-calorie malnutrition: Secondary | ICD-10-CM | POA: Insufficient documentation

## 2021-12-27 DIAGNOSIS — L8990 Pressure ulcer of unspecified site, unspecified stage: Secondary | ICD-10-CM | POA: Insufficient documentation

## 2021-12-27 LAB — BLOOD GAS, ARTERIAL
Acid-base deficit: 2 mmol/L (ref 0.0–2.0)
Bicarbonate: 23.7 mmol/L (ref 20.0–28.0)
FIO2: 40 %
MECHVT: 400 mL
Mechanical Rate: 16
O2 Saturation: 91 %
PEEP: 5 cmH2O
Patient temperature: 37
RATE: 16 resp/min
pCO2 arterial: 43 mmHg (ref 32–48)
pH, Arterial: 7.35 (ref 7.35–7.45)
pO2, Arterial: 59 mmHg — ABNORMAL LOW (ref 83–108)

## 2021-12-27 LAB — CBC
HCT: 37.4 % (ref 36.0–46.0)
Hemoglobin: 11.6 g/dL — ABNORMAL LOW (ref 12.0–15.0)
MCH: 30.4 pg (ref 26.0–34.0)
MCHC: 31 g/dL (ref 30.0–36.0)
MCV: 97.9 fL (ref 80.0–100.0)
Platelets: 171 10*3/uL (ref 150–400)
RBC: 3.82 MIL/uL — ABNORMAL LOW (ref 3.87–5.11)
RDW: 13.9 % (ref 11.5–15.5)
WBC: 12.1 10*3/uL — ABNORMAL HIGH (ref 4.0–10.5)
nRBC: 0 % (ref 0.0–0.2)

## 2021-12-27 LAB — TROPONIN I (HIGH SENSITIVITY)
Troponin I (High Sensitivity): 18311 ng/L (ref <18)
Troponin I (High Sensitivity): 11266 ng/L (ref <18)

## 2021-12-27 LAB — BASIC METABOLIC PANEL
Anion gap: 7 (ref 5–15)
BUN: 37 mg/dL — ABNORMAL HIGH (ref 8–23)
CO2: 23 mmol/L (ref 22–32)
Calcium: 8.3 mg/dL — ABNORMAL LOW (ref 8.9–10.3)
Chloride: 109 mmol/L (ref 98–111)
Creatinine, Ser: 2.01 mg/dL — ABNORMAL HIGH (ref 0.44–1.00)
GFR, Estimated: 25 mL/min — ABNORMAL LOW (ref >60)
Glucose, Bld: 136 mg/dL — ABNORMAL HIGH (ref 70–99)
Potassium: 5.4 mmol/L — ABNORMAL HIGH (ref 3.5–5.1)
Sodium: 139 mmol/L (ref 135–145)

## 2021-12-27 LAB — LACTIC ACID, PLASMA: Lactic Acid, Venous: 1.2 mmol/L (ref 0.5–1.9)

## 2021-12-27 LAB — PHOSPHORUS: Phosphorus: 5.8 mg/dL — ABNORMAL HIGH (ref 2.5–4.6)

## 2021-12-27 LAB — ECHOCARDIOGRAM COMPLETE
Area-P 1/2: 3.26 cm2
Height: 62.008 in
S' Lateral: 1.5 cm
Weight: 1403.89 oz

## 2021-12-27 LAB — MAGNESIUM: Magnesium: 2 mg/dL (ref 1.7–2.4)

## 2021-12-27 LAB — POTASSIUM: Potassium: 4.5 mmol/L (ref 3.5–5.1)

## 2021-12-27 MED ORDER — SODIUM ZIRCONIUM CYCLOSILICATE 5 G PO PACK
10.0000 g | PACK | Freq: Every day | ORAL | Status: AC
  Administered 2021-12-27: 10 g
  Filled 2021-12-27: qty 2

## 2021-12-27 MED ORDER — THIAMINE HCL 100 MG PO TABS
100.0000 mg | ORAL_TABLET | Freq: Every day | ORAL | Status: DC
  Administered 2021-12-27: 100 mg
  Filled 2021-12-27: qty 1

## 2021-12-27 MED ORDER — GADOBUTROL 1 MMOL/ML IV SOLN
5.0000 mL | Freq: Once | INTRAVENOUS | Status: AC | PRN
  Administered 2021-12-27: 5 mL via INTRAVENOUS

## 2021-12-27 MED ORDER — ADULT MULTIVITAMIN W/MINERALS CH
1.0000 | ORAL_TABLET | Freq: Every day | ORAL | Status: DC
  Administered 2021-12-27: 1
  Filled 2021-12-27: qty 1

## 2021-12-27 MED ORDER — LIDOCAINE 5 % EX PTCH
1.0000 | MEDICATED_PATCH | CUTANEOUS | Status: DC
  Administered 2021-12-27: 1 via TRANSDERMAL
  Filled 2021-12-27 (×4): qty 1

## 2021-12-27 MED ORDER — METOPROLOL TARTRATE 25 MG PO TABS
12.5000 mg | ORAL_TABLET | Freq: Two times a day (BID) | ORAL | Status: DC
Start: 2021-12-27 — End: 2021-12-27
  Administered 2021-12-27: 12.5 mg
  Filled 2021-12-27: qty 1

## 2021-12-27 MED ORDER — ALBUTEROL SULFATE (2.5 MG/3ML) 0.083% IN NEBU
2.5000 mg | INHALATION_SOLUTION | RESPIRATORY_TRACT | Status: DC | PRN
  Administered 2021-12-28 (×2): 2.5 mg via RESPIRATORY_TRACT
  Filled 2021-12-27 (×2): qty 3

## 2021-12-27 MED ORDER — FOLIC ACID 1 MG PO TABS
1.0000 mg | ORAL_TABLET | Freq: Every day | ORAL | Status: DC
  Administered 2021-12-27: 1 mg
  Filled 2021-12-27: qty 1

## 2021-12-27 MED ORDER — MORPHINE SULFATE (PF) 2 MG/ML IV SOLN
1.0000 mg | Freq: Once | INTRAVENOUS | Status: AC
  Administered 2021-12-27: 1 mg via INTRAVENOUS
  Filled 2021-12-27: qty 1

## 2021-12-27 NOTE — Progress Notes (Signed)
RT at bedside to assist with bronchoscopy. Consent obtained and time out performed by RN prior to procedure. FiO2 increased to 100% and peep to +12 for the bronchoscopy. MD suctioned copious amounts of stringy thick mucus/plugs from airway. Specimen obtained for microbiology. Patient tolerated well.FiO2 and PEEP returned to previous, 40% and +5, settings.

## 2021-12-27 NOTE — Progress Notes (Addendum)
GOALS OF CARE FAMILY CONFERENCE   Current clinical status, hospital findings and medical plan was reviewed with family.   Updated and notified of patients ongoing immediate critical medical problems.   Patient remains critically ill with acute CVA on ventilator with advanced COPD   Patient is unable to move left side of body and is with severe comorbidities  Explained to family course of therapy and the modalities   Patient with Progressive multiorgan failure with high probability of a very minimal chance of meaningful recovery despite aggressive and optimal medical therapy.   Family is appreciative of care and relate understanding that patient is severely critically ill with anticipation of passing away during this hospitalization.   They have consented and agreed to DNR/DNI  Code status   Family are satisfied with Plan of action and management. All questions answered  Additional Critical Care time 35 mins    Ottie Glazier, M.D.  Pulmonary & [place]

## 2021-12-27 NOTE — Procedures (Signed)
PROCEDURE: BRONCHOSCOPY Therapeutic Aspiration of Tracheobronchial Tree  PROCEDURE DATE: 12/27/2021  TIME:  NAME:  [name] [name]  DOB:January 06, 1946  MRN: [id number] LOC:  IC01A/IC01A-AA    HOSP DAY: '@LENGTHOFSTAYDAYS'$ @ CODE STATUS:      Code Status Orders  (From admission, onward)           Start     Ordered   12/26/21 1257  Full code  Continuous        12/26/21 1258           Code Status History     Date Active Date Inactive Code Status Order ID Comments User Context   10/18/2017 1628 10/25/2017 1443 Full Code [id number]  Saundra Shelling, MD Inpatient           Indications/Preliminary Diagnosis:   Consent: (Place X beside choice/s below)  The benefits, risks and possible complications of the procedure were        explained to:  ___ patient  __x_ patient's family  ___ other:___________  who verbalized understanding and gave:  ___ verbal  __x_ written  ___ verbal and written  ___ telephone  ___ other:________ consent.      Unable to obtain consent; procedure performed on emergent basis.     Other:       PRESEDATION ASSESSMENT: History and Physical has been performed. Patient meds and allergies have been reviewed. Presedation airway examination has been performed and documented. Baseline vital signs, sedation score, oxygenation status, and cardiac rhythm were reviewed. Patient was deemed to be in satisfactory condition to undergo the procedure.    PREMEDICATIONS:   Sedative/Narcotic Amt Dose   Versed 2 mg   Fentanyl  mcg  Diprivan  mg            PROCEDURE DETAILS: Timeout performed and correct patient, name, & ID confirmed. Following prep per Pulmonary policy, appropriate sedation was administered. The Bronchoscope was inserted in to oral cavity with bite block in place. Therapeutic aspiration of Tracheobronchial tree was performed.  Airway exam proceeded with findings, technical procedures, and specimen collection as noted below. At the end of exam the scope  was withdrawn without incident. Impression and Plan as noted below.           Airway Prep (Place X beside choice below)   1% Transtracheal Lidocaine Anesthetization 7 cc   Patient prepped per Bronchoscopy Lab Policy       Insertion Route (Place X beside choice below)   Nasal   Oral  x Endotracheal Tube   Tracheostomy   INTRAPROCEDURE MEDICATIONS:  Sedative/Narcotic Amt Dose   Versed 2 mg   Fentanyl  mcg  Diprivan  mg       Medication Amt Dose  Medication Amt Dose  Lidocaine 1%  cc  Epinephrine 1:10,000 sol  cc  Xylocaine 4%  cc  Cocaine  cc   TECHNICAL PROCEDURES: (Place X beside choice below)   Procedures  Description    None     Electrocautery     Cryotherapy     Balloon Dilatation     Bronchography     Stent Placement   x5  Therapeutic Aspiration Left lower lobe anterior, lateral and posterior basal segments and superior segment of LLL all impacted with inspissated phlegm.      Laser/Argon Plasma    Brachytherapy Catheter Placement    Foreign Body Removal         SPECIMENS (Sites): (Place X beside choice below)  Specimens Description   No Specimens Obtained  x Washings Sent for microbiology   Lavage    Biopsies    Fine Needle Aspirates    Brushings    Sputum    FINDINGS:  ESTIMATED BLOOD LOSS: none COMPLICATIONS/RESOLUTION: none      IMPRESSION:POST-PROCEDURE DX:   Severe mucus impaction of multiple segments of left lower lobe  RECOMMENDATION/PLAN:   Start SBT and liberate from MV due to therapeutic aspiration of LLL basal segments.    Ottie Glazier, M.D.  Pulmonary & [place]

## 2021-12-27 NOTE — Progress Notes (Addendum)
[facility] Cardiology  CARDIOLOGY CONSULT NOTE  Patient ID: [name] [name] MRN: [id number] DOB/AGE: [birth year] 76 y.o.  Admit date: 12/26/2021 Referring Physician [facility] Primary Physician  Primary Cardiologist Paraschos (last seen 2018)  Reason for Consultation elevated troponin  HPI: 76 year old female with a past medical history of COPD with ongoing tobacco use, gastric ulcers, GERD, hypertension, hyperlipidemia, palpitations, prior TIA who arrived at [facility] ED 12/26/2021 in an unresponsive state.    According to the daughter, she was speaking with the patient on the phone, when the patient became short of breath.  Went to check on her, at which time the patient was unresponsive, EMS was called, and was brought to [facility] ED she was noted to be hypoxic, intubated and placed on mechanical ventilator.  Chest x-ray revealed possible left lung pneumonia.  Chest CT did not reveal evidence for pulmonary embolus.  CT scan did reveal left lung dense atelectasis left lower lobe which may have been caused by mucous plugging, a new 12 mm pleural-based noncalcified density posterior left upper lung suggesting focal infectious process.  Head CT did not reveal any acute intracranial abnormality.  Admission labs revealed BUN and creatinine of 27 and 1.56, with GFR 34.  BNP was 2045, high-sensitivity troponin was elevated (320, 1283).  ECG revealed sinus rhythm with associated ST-T wave abnormalities which appeared unchanged compared to prior ECG from 01/14/2018.  Of note, OG tube was placed, and suctioning of blood was noted.  Hemoglobin and hematocrit were 10.8 and 36.2, respectively.  Interval History:  - troponin uptrending to 18000 today, able to communicate via nodding that she does not have chest pain -She has complete neglect of her left side, does not squeeze my hand or lift her left leg.  Going for MRI of the brain this afternoon. 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  Some speech deficit.   Wears dentures    full upper and lower    Past Surgical History:  Procedure Laterality Date   ABDOMINAL HYSTERECTOMY  1982   BACK SURGERY     CATARACT EXTRACTION W/PHACO Left 09/24/2021   Procedure: CATARACT EXTRACTION PHACO AND INTRAOCULAR LENS PLACEMENT ([place]) LEFT;  Surgeon: Eulogio Bear, MD;  Location: [place];  Service: Ophthalmology;  Laterality: Left;  3.80 0:31.7   CATARACT EXTRACTION W/PHACO Right 10/08/2021   Procedure: CATARACT EXTRACTION PHACO AND INTRAOCULAR LENS PLACEMENT ([facility]) RIGHT 3.84 00:35.3;  Surgeon: Eulogio Bear, MD;  Location: [place];  Service: Ophthalmology;  Laterality: Right;   COLONOSCOPY WITH PROPOFOL N/A 04/07/2018   Procedure: COLONOSCOPY WITH PROPOFOL;  Surgeon: [name], MD;  Location: [facility] ENDOSCOPY;  Service: Endoscopy;  Laterality: N/A;   KYPHOPLASTY N/A 10/23/2017   Procedure: [id number];  Surgeon: Hessie Knows, MD;  Location: [facility] ORS;  Service: Orthopedics;  Laterality: N/A;    Medications Prior to Admission  Medication Sig Dispense Refill Last Dose   ALPRAZolam (XANAX) 0.5 MG tablet Take  1 tablet (0.5 mg total) by mouth 3 (three) times daily as needed for anxiety. 90 tablet 2 Past Week   amLODipine (NORVASC) 5 MG tablet TAKE 1 TABLET(5 MG) BY MOUTH DAILY 90 tablet 3 Past Week   aspirin 81 MG tablet Take 81 mg by mouth daily.   Past Week   Calcium 600-200 MG-UNIT tablet Take 1 tablet by mouth daily.   Past Week   cholecalciferol  (VITAMIN D) 25 MCG (1000 UNIT) tablet Take 1,000 Units by mouth daily.   Past Week   citalopram (CELEXA) 40 MG tablet TAKE 1 TABLET(40 MG) BY MOUTH DAILY 30 tablet 3 Past Week   gabapentin (NEURONTIN) 600 MG tablet Take 1 tablet (600 mg total) by mouth 3 (three) times daily. 270 tablet 1 Past Week   HYDROcodone-acetaminophen (NORCO/VICODIN) 5-325 MG tablet Take 1 tablet by mouth every 6 (six) hours as needed for moderate pain. 120 tablet 0 Past Week   lisinopril (ZESTRIL) 40 MG tablet TAKE 1 TABLET(40 MG) BY MOUTH DAILY 90 tablet 0 Past Week   meloxicam (MOBIC) 7.5 MG tablet Take 1 tablet (7.5 mg total) by mouth daily as needed for pain (arthritis). 90 tablet 1 Past Week   metoprolol succinate (TOPROL-XL) 25 MG 24 hr tablet TAKE 1 TABLET(25 MG) BY MOUTH DAILY 90 tablet 3 Past Week   simvastatin (ZOCOR) 10 MG tablet TAKE 1 TABLET(10 MG) BY MOUTH DAILY 90 tablet 3 Past Week   albuterol (VENTOLIN HFA) 108 (90 Base) MCG/ACT inhaler Inhale 2 puffs into the lungs every 4 (four) hours as needed for wheezing or shortness of breath. 18 g 5 prn   docusate sodium (COLACE) 100 MG capsule Take 100 mg by mouth 2 (two) times daily as needed for mild constipation.   prn   furosemide (LASIX) 20 MG tablet Take 20 mg by mouth daily as needed for fluid.    prn   pantoprazole (PROTONIX) 20 MG tablet TAKE 1 TABLET(20 MG) BY MOUTH DAILY (Patient not taking: Reported on 12/27/2021) 90 tablet 1 Not Taking   potassium chloride SA (K-DUR,KLOR-CON) 20 MEQ tablet Take 1 tablet (20 mEq total) by mouth daily. 90 tablet 1 prn   Propylene Glycol (SYSTANE COMPLETE OP) Apply to eye.   prn   Tiotropium Bromide Monohydrate (SPIRIVA RESPIMAT) 1.25 MCG/ACT AERS Inhale 1 puff into the lungs daily. (Patient not taking: Reported on 12/27/2021) 4 g 5 Not Taking   Social History   Socioeconomic History   Marital status: Widowed    Spouse name: Not on file   Number of children: 1   Years of education: Not on file   Highest education level:  Some college, no degree  Occupational History    Employer: RETIRED  Tobacco Use   Smoking status: Every Day    Packs/day: 0.50    Years: 53.00    Pack years: 26.50    Types: Cigarettes   Smokeless tobacco: Never   Tobacco comments:    Since age 66  Vaping Use   Vaping Use: Never used  Substance and Sexual Activity   Alcohol use: Not Currently    Alcohol/week: 2.0 standard drinks    Types: 2 Glasses of wine per week   Drug use: Not Currently    Types: Marijuana    Comment: in the past   Sexual activity: Not on file  Other Topics Concern   Not on file  Social History Narrative   Not on file   Social Determinants of Health   Financial  Resource Strain: Low Risk    Difficulty of Paying Living Expenses: Not hard at all  Food Insecurity: No Food Insecurity   Worried About Charity fundraiser in the Last Year: Never true   Ran Out of Food in the Last Year: Never true  Transportation Needs: No Transportation Needs   Lack of Transportation (Medical): No   Lack of Transportation (Non-Medical): No  Physical Activity: Inactive   Days of Exercise per Week: 0 days   Minutes of Exercise per Session: 0 min  Stress: Stress Concern Present   Feeling of Stress : Rather much  Social Connections: Socially Isolated   Frequency of Communication with Friends and Family: More than three times a week   Frequency of Social Gatherings with Friends and Family: Never   Attends Religious Services: Never   Marine scientist or Organizations: No   Attends Archivist Meetings: Never   Marital Status: Widowed  Human resources officer Violence: Not on file    Family History  Problem Relation Age of Onset   Leukemia Mother    Aneurysm Father    Diabetes Father    Heart disease Father    Liver cancer Sister    Cancer Sister    Lung cancer Sister        metastatic   Diabetes Maternal Grandmother    Diabetes Paternal Grandmother       Review of systems complete and found to be  negative unless listed above      PHYSICAL EXAM  General: Elderly and frail-appearing Caucasian female, lying in ICU bed mechanically ventilated but awake and able to follow commands. HEENT:  Normocephalic and atramatic Neck:  No JVD.  Lungs: Mechanically ventilated breath sounds without appreciable wheezes Heart: Tachycardic but regular.  S1 present with soft S2, 3/6 blowing systolic murmur best heard at RUSB Abdomen: Nondistended appearing Msk: Spontaneously moves her right upper and lower extremity, flaccid left upper and lower extremity. 4/5 grip right hand Extremities: No clubbing, cyanosis or edema.  SCDs Neuro: Alert, off sedation Psych: Alert, able to follow commands, nod her head yes and no  Labs:   Lab Results  Component Value Date   WBC 12.1 (H) 12/27/2021   HGB 11.6 (L) 12/27/2021   HCT 37.4 12/27/2021   MCV 97.9 12/27/2021   PLT 171 12/27/2021    Recent Labs  Lab 12/26/21 1202 12/27/21 0414  NA 137 139  K 4.6 5.4*  CL 103 109  CO2 23 23  BUN 27* 37*  CREATININE 1.56* 2.01*  CALCIUM 8.8* 8.3*  PROT 7.1  --   BILITOT 0.5  --   ALKPHOS 75  --   ALT 28  --   AST 51*  --   GLUCOSE 112* 136*    Lab Results  Component Value Date   CKTOTAL 127 12/26/2021   TROPONINI 0.03 (HH) 01/14/2018     Lab Results  Component Value Date   CHOL 142 12/12/2020   CHOL 192 09/26/2017   CHOL 151 08/28/2016   Lab Results  Component Value Date   HDL 61 12/12/2020   HDL 53 09/26/2017   HDL 62 08/28/2016   Lab Results  Component Value Date   LDLCALC 62 12/12/2020   LDLCALC 106 (H) 09/26/2017   LDLCALC 68 08/28/2016   Lab Results  Component Value Date   TRIG 103 12/12/2020   TRIG 164 (H) 09/26/2017   TRIG 107 08/28/2016   Lab Results  Component Value Date  CHOLHDL 2.3 12/12/2020   CHOLHDL 3.6 09/26/2017   CHOLHDL 2.4 08/28/2016   No results found for: LDLDIRECT    Radiology: DG Abdomen 1 View  Result Date: 12/26/2021 CLINICAL DATA:  Orogastric  tube placement EXAM: ABDOMEN - 1 VIEW COMPARISON:  Chest radiograph 12/26/2021 and MRI abdomen 08/31/2020 FINDINGS: Pulmonary nodule versus nipple shadows in the lungs. Emphysema. Extensive atherosclerosis. Aortic Atherosclerosis (ICD10-I70.0). Possible left pleural thickening at the lung base, cannot exclude pleural effusion. L1 vertebral augmentation. Bony demineralization. The orogastric tube tip is in the stomach body with side port in the stomach body. IMPRESSION: 1. Orogastric tube tip and side port in the stomach body. 2. Possible left pleural effusion. 3. Emphysema and interstitial accentuation in the lungs. 4. Aortic Atherosclerosis (ICD10-I70.0) and Emphysema (ICD10-J43.9). 5. Nodularity in the mid lungs, nipple shadows versus true pulmonary nodules. Electronically Signed   By: Van Clines M.D.   On: 12/26/2021 12:52   CT Head Wo Contrast  Result Date: 12/26/2021 CLINICAL DATA:  Mental status change, persistent or worsening, intubated, ams EXAM: CT HEAD WITHOUT CONTRAST TECHNIQUE: Contiguous axial images were obtained from the base of the skull through the vertex without intravenous contrast. RADIATION DOSE REDUCTION: This exam was performed according to the departmental dose-optimization program which includes automated exposure control, adjustment of the mA and/or kV according to patient size and/or use of iterative reconstruction technique. COMPARISON:  2017 FINDINGS: Brain: There is no acute intracranial hemorrhage, mass effect, or edema. Gray-white differentiation is preserved. There is no extra-axial fluid collection. Prominence of the ventricles and sulci reflects minor parenchymal volume loss. Minimal patchy hypoattenuation in the supratentorial white matter is nonspecific but may reflect minor chronic microvascular ischemic changes. Chronic small vessel infarct the posterolateral right putamen. Vascular: There is atherosclerotic calcification at the skull base. Skull: Calvarium is  unremarkable. Sinuses/Orbits: No acute finding. Other: None. IMPRESSION: No acute intracranial abnormality. Electronically Signed   By: Macy Mis M.D.   On: 12/26/2021 15:59   CT Angio Chest PE W and/or Wo Contrast  Result Date: 12/26/2021 CLINICAL DATA:  High clinical suspicion for pulmonary embolism EXAM: CT ANGIOGRAPHY CHEST WITH CONTRAST TECHNIQUE: Multidetector CT imaging of the chest was performed using the standard protocol during bolus administration of intravenous contrast. Multiplanar CT image reconstructions and MIPs were obtained to evaluate the vascular anatomy. RADIATION DOSE REDUCTION: This exam was performed according to the departmental dose-optimization program which includes automated exposure control, adjustment of the mA and/or kV according to patient size and/or use of iterative reconstruction technique. CONTRAST:  2m OMNIPAQUE IOHEXOL 350 MG/ML SOLN COMPARISON:  Chest radiograph done earlier today and CT chest done on 05/23/2020 FINDINGS: Cardiovascular: Coronary artery calcifications are seen. Dense calcification is seen in the mitral annulus. Contrast density in the thoracic aorta is less than adequate to evaluate the lumen. There is ectasia of ascending thoracic aorta measuring 3.5 cm. There is focal aneurysmal dilation in the proximal descending thoracic aorta measuring 4.2 cm in AP diameter which measured 4 cm in the previous study. Aortic arch measures 3 cm. Distal thoracic aorta measures 2.9 cm. There is dense calcification filling much of the lumen of proximal abdominal aorta. Mediastinum/Nodes: There are calcified nodules in the mediastinum. Enteric tube is noted traversing the esophagus. Tip of endotracheal tube is at the level of aortic arch. Lungs/Pleura: Severe centrilobular and panlobular emphysema is seen. There is decreased volume in the left lung. There is dense atelectasis in the left lower lobe. There are faint ground-glass densities in the  left upper lobe. Small  patchy infiltrates are seen in the right lower lobe. In image 20 of series 7 there is 12 mm pleural-based nodular density in the posterior left upper lung fields which was not seen in the previous examination. Small to moderate bilateral pleural effusions are seen. There is no pneumothorax. Upper Abdomen: Distal portion of enteric tube is seen in the stomach. Musculoskeletal: There is decrease in height of few of the thoracic vertebral bodies and L1 vertebral body. There is previous vertebroplasty in the L1 vertebra. These findings have not changed significantly. Review of the MIP images confirms the above findings. IMPRESSION: There is no evidence of pulmonary artery embolism. There is decreased volume in the left lung with dense atelectasis in the left lower lobe. This may be caused by mucous plugging in the airways or central obstructing neoplastic process. Follow-up CT and endoscopy as warranted should be considered. Small patchy infiltrates are seen in the right lower lung fields suggesting subsegmental atelectasis. Small to moderate bilateral pleural effusions are seen. There is a new 12 mm pleural-based noncalcified density in the posterior left upper lung fields. This may suggest focal infectious process or scarring or neoplasm. This finding could also be re-evaluated in follow-up CT. 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 Extensive atherosclerotic changes are noted. There is a large calcific density filling much of the lumen of proximal abdominal aorta. Possibility of high-grade stenosis is not excluded. There is no adequate contrast enhancement in the systemic circulation limiting evaluation. Severe centrilobular and panlobular emphysema. Coronary artery calcifications are seen. Other findings as described in the body of the report. Electronically Signed   By: Elmer Picker M.D.   On: 12/26/2021 16:19   DG Chest Port 1 View  Result Date: 12/27/2021 CLINICAL DATA:   Mucous plugging EXAM: PORTABLE CHEST 1 VIEW COMPARISON:  Radiograph 12/26/2021, chest CT 12/26/2021 FINDINGS: The patient is rotated towards the left. Endotracheal tube overlies the midthoracic trachea. Orogastric tube passes below the diaphragm, tip excluded by collimation. Increased left pleural effusion and left mid to lower lung airspace consolidation. Increasing opacities in the left upper lung. Stable small right pleural effusion. Emphysema with interstitial prominence. No pneumothorax. There is a chronic posttraumatic deformity of the right proximal humerus and multiple left ribs. IMPRESSION: Increased left pleural effusion and worsening adjacent left mid to lower lung consolidation, likely worsening atelectasis due to a combination of pleural effusion and mucous plugging. Stable small right pleural effusion. Electronically Signed   By: Maurine Simmering M.D.   On: 12/27/2021 10:39   DG Chest Port 1 View  Result Date: 12/26/2021 CLINICAL DATA:  Questionable sepsis. EXAM: PORTABLE CHEST 1 VIEW COMPARISON:  Radiograph January 14, 2018 FINDINGS: Endotracheal tube with tip projecting over the midthoracic trachea at the level of the aortic arch. Nasogastric tube courses below the diaphragm with tip and side port obscured by collimation. Additional extracorporeal leads overlie the chest and upper abdomen. Enlarged cardiac silhouette with central vascular prominence. Opacity in the left lung base with partial obscuration of left hemidiaphragm. Chronic bronchitic lung changes. No acute osseous abnormality. IMPRESSION: Opacity in the left lung base with partial obscuration of left hemidiaphragm. Could reflect an effusion and/or atelectasis/pneumonia. Electronically Signed   By: Dahlia Bailiff M.D.   On: 12/26/2021 12:53    EKG: Sinus rhythm with LVH and associated ST-T wave abnormalities  ECHO 03/2016  ECHOCARDIOGRAPHIC DESCRIPTIONS   AORTIC ROOT          Size:Normal  Dissection:INDETERM FOR DISSECTION   AORTIC  VALVE      Leaflets:Tricuspid         Morphology:Normal      Mobility:Fully mobile   LEFT VENTRICLE          Size:Normal              Anterior:Normal   Contraction:Normal               Lateral:Normal    Closest EF:>55% (Estimated)      Septal:Normal     LV Masses:No Masses             Apical:Normal           LGX:QJJH LVH            Inferior:Normal                                  Posterior:Normal  Dias.FxClass:N/A   MITRAL VALVE      Leaflets:Normal              Mobility:Fully mobile    Morphology:ANNULAR CALC   LEFT ATRIUM          Size:Normal             LA Masses:No masses   MAIN PA          Size:Normal   PULMONIC VALVE      Leaflets:N/A               Morphology:Normal      Mobility:Fully mobile   RIGHT VENTRICLE     RV Masses:No Masses               Size:Normal     Free Wall:Normal           Contraction:Normal   TRICUSPID VALVE      Leaflets:Normal              Mobility:Fully mobile    Morphology:Normal   RIGHT ATRIUM          Size:Normal              RA Other:None       RA Mass:No masses   PERICARDIUM         Fluid:No effusion   INFERIOR VENACAVA          Size:Normal Normal respiratory collapse   ____________________________________________________________________   DOPPLER ECHO and OTHER SPECIAL PROCEDURES     Aortic:No AR                         No AS            143.5 cm/sec peak vel         8.2 mmHg peak grad      Mitral:MILD MR                       No MS                                          3.6 cm^2 by DOPPLER            MV Inflow E Vel=62.9 cm/sec   MV Annulus E'Vel=3.7 cm/sec            E/E'Ratio=17.0   Tricuspid:MILD TR  No TS            233.5 cm/sec peak TR vel      26.8 mmHg peak RV pressure   Pulmonary:MILD PR                       No PS            95.2 cm/sec peak vel          3.6 mmHg peak grad     ___________________________________________________________________________________________   INTERPRETATION   NORMAL LEFT VENTRICULAR SYSTOLIC FUNCTION   WITH MILD LVH  NORMAL RIGHT VENTRICULAR SYSTOLIC FUNCTION  MILD VALVULAR REGURGITATION (See above)  NO VALVULAR STENOSIS  EF >55%   ASSESSMENT AND PLAN:   1.  Elevated troponin, consistent with NSTEMI in the absence of chest pain or new ECG changes, in the setting of respiratory failure, possible pneumonia, COPD, trop peak of 16[address] today -- possibly from acute CVA 2.  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  Respiratory failure, possible pneumonia and patient with known advanced  COPD, intubated 4.  Acute kidney injury, BUN and Cr 27 and 1.56, respectively w/ GFR 34, likely secondary to ATN  Recommendations  1.  Agree with current therapy 2.  ADDENDUM: MRI brain with acute CVA, neuro consulted by primary, with acute anemia and GIB on admission will hold off aspirin for now 3.  Defer full dose anticoagulation at this time 4.  Hold further doses of metoprolol tartrate 12.5 BID per tube, goal to consolidate to XL once outside of permissive hypertension window 5.  Review 2D echocardiogram, pending. Further recommendations based on results.   This patient's plan of care was discussed and created with Dr. Saralyn Pilar and he is in agreement.    Signed: Tristan Schroeder, PA-C 12/27/2021, 12:38 PM

## 2021-12-27 NOTE — Consult Note (Signed)
[facility] for Electrolyte Monitoring and Replacement   Recent Labs: Potassium (mmol/L)  Date Value  12/27/2021 5.4 (H)  11/04/2013 4.0   Magnesium (mg/dL)  Date Value  12/27/2021 2.0   Calcium (mg/dL)  Date Value  12/27/2021 8.3 (L)   Calcium, Total (mg/dL)  Date Value  11/04/2013 8.8   Albumin (g/dL)  Date Value  12/26/2021 3.7  12/12/2020 4.6   Phosphorus (mg/dL)  Date Value  12/27/2021 5.8 (H)   Sodium (mmol/L)  Date Value  12/27/2021 139  12/12/2020 136  11/04/2013 131 (L)   Assessment: Patient is a 76 y/o F with medical history including COPD, HLD, GERD, EtOH use disorder, tobacco use disorder, osteoporosis who was BIBEMS 5/31 after being found at home unresponsive. Patient intubated in the ED for hypercarbic respiratory failure. Disposition is the ICU. Pharmacy consulted to assist with electrolyte monitoring and replacement as indicated.  Goal of Therapy:  Electrolytes within normal limits  Plan:  --10 grams Lokelma po x 1 per NP --Follow-up electrolytes with AM labs tomorrow  Dallie Piles 12/27/2021 6:58 AM

## 2021-12-27 NOTE — Progress Notes (Signed)
0800 patient alert following commands unable to move entire left side no response to pain on left side 0900 daughter at bedside updated on plan of care bronch then MRI then wean on vent for possible extubation  0932 bedside broch to take place 1000 pt tolerated well no complications noted

## 2021-12-27 NOTE — Progress Notes (Signed)
CRITICAL CARE PROGRESS NOTE    Name: [name] [name] MRN: [id number] DOB: 1946-03-10     LOS: 1   SUBJECTIVE FINDINGS & SIGNIFICANT EVENTS    Patient description:  This is a 76 yo F with hx of advanced COPD, anxiety disorder, dyslipidemia, osteoporosis, history of previous CVA who came in an unresponsive state.  History was obtained from her daughter who is present during examination.  Daughter states that she was speaking with the patient on the phone and noted labored breathing subsequently she drove over to her home to check on her and found her to be unresponsive.  Daughter was attempting to redirect and arouse patient however was unable to do so and called EMS due to unresponsive state.  In the ED patient was found to be hypoxemic was intubated placed on mechanical ventilation.  She had OG tube placed and noted suctioning of blood.  Her blood work including CBC was abnormal showing a decrement in hemoglobin from baseline of 12.4-10.8 with macrocytic anemia, she does have leukocytosis with white count of 12.6.  Cardiac biomarkers noted to be trending up over 1200, and BNP over 2000.  She had a chest x-ray performed which showed possible left lung pneumonia.  Also had CT head ordered and CT angio of the chest which are in process during my evaluation.  Pulmonary critical care service called for admission to medical intensive care unit.   12/27/21- patient seen at bedside, shes more awake and able to move but now exhibiting left sided weakness.  We have ordered MRI brain , she does have renal impairment. She had bronchoscopy to unplug severe L lung mucus plugging and is now oxygenating better with plan for SBT and potential liberation from MV.   Lines/tubes : NG/OG Vented/Dual Lumen 16 Fr. Oral Marking at nare/corner of mouth  60 cm (Active)  Tube Position (Required) Marking at nare/corner of mouth 12/26/21 1233  Measurement (cm) (Required) 60 cm 12/26/21 1233  Ongoing Placement Verification (Required) (See row information) Yes 12/26/21 1233  Site Assessment Clean, Dry, Intact;Tape intact 12/26/21 1233  Interventions X-ray 12/26/21 1233  Status Low continuous suction 12/26/21 1233  Amount of suction 80 mmHg 12/26/21 1233     Urethral Catheter Jacque RN Temperature probe 16 Fr. (Active)  Indication for Insertion or Continuance of Catheter Unstable critically ill patients first 24-48 hours (See Criteria) 12/26/21 1237  Site Assessment Clean, Dry, Intact 12/26/21 1237  Catheter Maintenance Bag below level of bladder;Catheter secured;Drainage bag/tubing not touching floor;No dependent loops;Seal intact 12/26/21 1237  Collection Container Standard drainage bag 12/26/21 1237  Securement Method Securing device (Describe) 12/26/21 1237    Microbiology/Sepsis markers: Results for orders placed or performed during the hospital encounter of 12/26/21  Blood Culture (routine x 2)     Status: None (Preliminary result)   Collection Time: 12/26/21 12:02 PM   Specimen: BLOOD  Result Value Ref Range Status   Specimen Description BLOOD LEFT ANTECUBITAL  Final   Special Requests   Final    BOTTLES DRAWN AEROBIC AND ANAEROBIC Blood Culture adequate volume   Culture   Final    NO GROWTH < 24 HOURS Performed at [facility], [address] [place], [place] [address]    Report Status PENDING  Incomplete  Blood Culture (routine x 2)     Status: None (Preliminary result)   Collection Time: 12/26/21 12:02 PM   Specimen: BLOOD  Result Value Ref Range Status   Specimen Description BLOOD BLOOD RIGHT WRIST  Final   Special Requests   Final    BOTTLES DRAWN AEROBIC AND ANAEROBIC Blood Culture adequate volume   Culture   Final    NO GROWTH < 24 HOURS Performed at [facility], [address] [place],  [place] [address]    Report Status PENDING  Incomplete  Resp Panel by RT-PCR (Flu A&B, Covid) Anterior Nasal Swab     Status: None   Collection Time: 12/26/21 12:27 PM   Specimen: Anterior Nasal Swab  Result Value Ref Range Status   SARS Coronavirus 2 by RT PCR NEGATIVE NEGATIVE Final    Comment: (NOTE) SARS-CoV-2 target nucleic acids are NOT DETECTED.  The SARS-CoV-2 RNA is generally detectable in upper respiratory specimens during the acute phase of infection. The lowest concentration of SARS-CoV-2 viral copies this assay can detect is 138 copies/mL. A negative result does not preclude SARS-Cov-2 infection and should not be used as the sole basis for treatment or other patient management decisions. A negative result may occur with  improper specimen collection/handling, submission of specimen other than nasopharyngeal swab, presence of viral mutation(s) within the areas targeted by this assay, and inadequate number of viral copies(<138 copies/mL). A negative result must be combined with clinical observations, patient history, and epidemiological information. The expected result is Negative.  Fact Sheet for Patients:  EntrepreneurPulse.com.au  Fact Sheet for Healthcare Providers:  IncredibleEmployment.be  This test is no t yet approved or cleared by the [place] FDA and  has been authorized for detection and/or diagnosis of SARS-CoV-2 by FDA under an Emergency Use Authorization (EUA). This EUA will remain  in effect (meaning this test can be used) for the duration of the COVID-19 declaration under Section 564(b)(1) of the Act, 21 U.S.C.section 360bbb-3(b)(1), unless the authorization is terminated  or revoked sooner.       Influenza A by PCR NEGATIVE NEGATIVE Final   Influenza B by PCR NEGATIVE NEGATIVE Final    Comment: (NOTE) The Xpert Xpress SARS-CoV-2/FLU/RSV plus assay is intended as an aid in the diagnosis of influenza from  Nasopharyngeal swab specimens and should not be used as a sole basis for treatment. Nasal washings and aspirates are unacceptable for Xpert Xpress SARS-CoV-2/FLU/RSV testing.  Fact Sheet for Patients: EntrepreneurPulse.com.au  Fact Sheet for Healthcare Providers: IncredibleEmployment.be  This test is not yet approved or cleared by the [place] FDA and has been authorized for detection and/or diagnosis of SARS-CoV-2 by FDA under an Emergency Use Authorization (EUA). This EUA will remain in effect (meaning this test can be used) for the duration of the COVID-19 declaration under Section 564(b)(1) of the Act, 21 U.S.C. section 360bbb-3(b)(1), unless the authorization is terminated or revoked.  Performed at [facility], [address] [place], [place] [address]   MRSA Next Gen by PCR, Nasal     Status: None   Collection Time: 12/26/21  4:01 PM   Specimen: Nasal Mucosa; Nasal Swab  Result Value Ref Range Status   MRSA by PCR Next Gen NOT DETECTED NOT DETECTED Final    Comment: (NOTE) The GeneXpert MRSA Assay (FDA approved for NASAL specimens only), is one component of a comprehensive MRSA colonization surveillance program. It is not intended to diagnose MRSA infection nor to guide or monitor treatment for MRSA infections. Test performance is not FDA approved in patients less than 89 years old. Performed at [facility], [address] [place], [place] [address]     Anti-infectives:  Anti-infectives (From admission, onward)  Start     Dose/Rate Route Frequency Ordered Stop   12/26/21 1630  azithromycin (ZITHROMAX) 500 mg in sodium chloride 0.9 % 250 mL IVPB        500 mg 250 mL/hr over 60 Minutes Intravenous Every 24 hours 12/26/21 1549     12/26/21 1630  cefTRIAXone (ROCEPHIN) 1 g in sodium chloride 0.9 % 100 mL IVPB        1 g 200 mL/hr over 30 Minutes Intravenous Every 24 hours 12/26/21 1549         PAST  MEDICAL HISTORY   Past Medical History:  Diagnosis Date   Anxiety    CAD (coronary artery disease) unk   Chronic pain    COPD (chronic obstructive pulmonary disease) (HCC)    Depression    GERD (gastroesophageal reflux disease)    Hypercholesteremia unk   Hypertension    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  Some speech deficit.   Wears dentures    full upper and lower     SURGICAL HISTORY   Past Surgical History:  Procedure Laterality Date   ABDOMINAL HYSTERECTOMY  1982   BACK SURGERY     CATARACT EXTRACTION W/PHACO Left 09/24/2021   Procedure: CATARACT EXTRACTION PHACO AND INTRAOCULAR LENS PLACEMENT ([place]) LEFT;  Surgeon: Eulogio Bear, MD;  Location: [facility];  Service: Ophthalmology;  Laterality: Left;  3.80 0:31.7   CATARACT EXTRACTION W/PHACO Right 10/08/2021   Procedure: CATARACT EXTRACTION PHACO AND INTRAOCULAR LENS PLACEMENT ([facility]) RIGHT 3.84 00:35.3;  Surgeon: Eulogio Bear, MD;  Location: [facility];  Service: Ophthalmology;  Laterality: Right;   COLONOSCOPY WITH PROPOFOL N/A 04/07/2018   Procedure: COLONOSCOPY WITH PROPOFOL;  Surgeon: [name], MD;  Location: [facility] ENDOSCOPY;  Service: Endoscopy;  Laterality: N/A;   KYPHOPLASTY N/A 10/23/2017   Procedure: [id number];  Surgeon: Hessie Knows, MD;  Location: [facility] ORS;  Service: Orthopedics;  Laterality: N/A;     FAMILY HISTORY   Family History  Problem Relation Age of Onset   Leukemia Mother    Aneurysm Father    Diabetes Father    Heart disease Father    Liver cancer Sister    Cancer Sister    Lung cancer Sister        metastatic   Diabetes Maternal Grandmother    Diabetes Paternal Grandmother      SOCIAL HISTORY   Social History   Tobacco Use   Smoking status: Every Day    Packs/day: 0.50    Years: 53.00    Pack years: 26.50    Types: Cigarettes   Smokeless tobacco: Never   Tobacco comments:    Since  age 41  Vaping Use   Vaping Use: Never used  Substance Use Topics   Alcohol use: Not Currently    Alcohol/week: 2.0 standard drinks    Types: 2 Glasses of wine per week   Drug use: Not Currently    Types: Marijuana    Comment: in the past     MEDICATIONS   Current Medication:  Current Facility-Administered Medications:    albumin human 25 % solution 25 g, 25 g, Intravenous, BID, Blaine Hari, MD, Last Rate: 60 mL/hr at 12/27/21 0800, Infusion Verify at 12/27/21 0800   azithromycin (ZITHROMAX) 500 mg in sodium chloride 0.9 % 250 mL IVPB, 500 mg, Intravenous, Q24H, Ottie Glazier, MD, Stopped at 12/26/21 1826   cefTRIAXone (ROCEPHIN) 1 g in  sodium chloride 0.9 % 100 mL IVPB, 1 g, Intravenous, Q24H, Aprel Egelhoff, MD, Stopped at 12/26/21 1657   chlorhexidine gluconate (MEDLINE KIT) (PERIDEX) 0.12 % solution 15 mL, 15 mL, Mouth Rinse, BID, Lanney Gins, Krystian Ferrentino, MD, 15 mL at 12/27/21 0727   Chlorhexidine Gluconate Cloth 2 % PADS 6 each, 6 each, Topical, Q0600, Ottie Glazier, MD, 6 each at 12/27/21 0411   docusate (COLACE) 50 MG/5ML liquid 100 mg, 100 mg, Per Tube, BID PRN, Ottie Glazier, MD   docusate (COLACE) 50 MG/5ML liquid 100 mg, 100 mg, Per Tube, BID, Darel Hong D, NP, 100 mg at 12/26/21 2210   fentaNYL (SUBLIMAZE) bolus via infusion 25-100 mcg, 25-100 mcg, Intravenous, Q15 min PRN, Darel Hong D, NP, 25 mcg at 12/27/21 0734   fentaNYL 2526mg in NS 2581m(1043mml) infusion-PREMIX, 0-400 mcg/hr, Intravenous, Continuous, QuaDelman KittenD, Stopped at 12/27/21 0411   furosemide (LASIX) injection 40 mg, 40 mg, Intravenous, BID, AleLanney Ginsuad, MD, 40 mg at 12/27/21 0720   hydrALAZINE (APRESOLINE) injection 10 mg, 10 mg, Intravenous, Q6H PRN, KeeDarel Hong NP, 10 mg at 12/27/21 0505   ipratropium-albuterol (DUONEB) 0.5-2.5 (3) MG/3ML nebulizer solution 3 mL, 3 mL, Nebulization, Once, QuaDelman KittenD   ipratropium-albuterol (DUONEB) 0.5-2.5 (3) MG/3ML nebulizer solution 3  mL, 3 mL, Nebulization, Once, QuaDelman KittenD   MEDLINE mouth rinse, 15 mL, Mouth Rinse, 10 times per day, AleOttie GlazierD, 15 mL at 12/27/21 0506   midazolam (VERSED) 100 mg/100 mL (1 mg/mL) premix infusion, 0.5-10 mg/hr, Intravenous, Continuous, QuaDelman KittenD, Stopped at 12/26/21 2123   midazolam (VERSED) injection 1 mg, 1 mg, Intravenous, Q15 min PRN, KeeDarel Hong NP   midazolam (VERSED) injection 1 mg, 1 mg, Intravenous, Q2H PRN, KeeDarel Hong NP   pantoprazole (PROTONIX) injection 40 mg, 40 mg, Intravenous, Q12H, KeeDarel Hong NP   polyethylene glycol (MIRALAX / GLYCOLAX) packet 17 g, 17 g, Per Tube, Daily PRN, AleOttie GlazierD   polyethylene glycol (MIRALAX / GLYCOLAX) packet 17 g, 17 g, Per Tube, Daily, KeeBradly BienenstockP    ALLERGIES   Patient has no known allergies.    REVIEW OF SYSTEMS    Unable to obtain due to acutely ill status  on mechanical ventilation  PHYSICAL EXAMINATION   Vital Signs: Temp:  [92.4 F (33.6 C)-100.4 F (38 C)] 99.1 F (37.3 C) (06/01 0800) Pulse Rate:  [73-107] 94 (06/01 0800) Resp:  [13-24] 13 (06/01 0800) BP: (100-178)/(54-78) 132/65 (06/01 0800) SpO2:  [85 %-100 %] 92 % (06/01 0822) FiO2 (%):  [40 %-100 %] 40 % (06/01 0822) Weight:  [39.8 kg-41.3 kg] 39.8 kg (06/01 0400)  GENERAL: Age-appropriate intubated on mechanical ventilation HEAD: Normocephalic, atraumatic.  EYES: Pupils equal, round, reactive to light.  No scleral icterus.  MOUTH: Moist mucosal membrane. NECK: Supple. No thyromegaly. No nodules. No JVD.  PULMONARY: Crackles bilaterally worse on the left CARDIOVASCULAR: S1 and S2. Regular rate and rhythm. No murmurs, rubs, or gallops.  GASTROINTESTINAL: Soft, nontender, non-distended. No masses. Positive bowel sounds. No hepatosplenomegaly.  MUSCULOSKELETAL: No swelling, clubbing, or edema.  NEUROLOGIC: GCS 4T SKIN:intact,warm,dry   PERTINENT DATA     Infusions:  albumin human 60 mL/hr at  12/27/21 0800   azithromycin Stopped (12/26/21 1826)   cefTRIAXone (ROCEPHIN)  IV Stopped (12/26/21 1657)   fentaNYL infusion INTRAVENOUS Stopped (12/27/21 0411)   midazolam Stopped (12/26/21 2123)   Scheduled Medications:  chlorhexidine gluconate (MEDLINE KIT)  15 mL Mouth Rinse BID  Chlorhexidine Gluconate Cloth  6 each Topical Q0600   docusate  100 mg Per Tube BID   furosemide  40 mg Intravenous BID   ipratropium-albuterol  3 mL Nebulization Once   ipratropium-albuterol  3 mL Nebulization Once   mouth rinse  15 mL Mouth Rinse 10 times per day   pantoprazole (PROTONIX) IV  40 mg Intravenous Q12H   polyethylene glycol  17 g Per Tube Daily   PRN Medications: docusate, fentaNYL, hydrALAZINE, midazolam, midazolam, polyethylene glycol Hemodynamic parameters:   Intake/Output: 05/31 0701 - 06/01 0700 In: 2142.3 [I.V.:35.8; NG/GT:150; IV Piggyback:1956.6] Out: 546 [Urine:720; Emesis/NG output:150]  Ventilator  Settings: Vent Mode: PRVC FiO2 (%):  [40 %-100 %] 40 % Set Rate:  [16 bmp] 16 bmp Vt Set:  [400 mL] 400 mL PEEP:  [5 cmH20] 5 cmH20 Plateau Pressure:  [17 cmH20] 17 cmH20    LAB RESULTS:  Basic Metabolic Panel: Recent Labs  Lab 12/26/21 1202 12/27/21 0414  NA 137 139  K 4.6 5.4*  CL 103 109  CO2 23 23  GLUCOSE 112* 136*  BUN 27* 37*  CREATININE 1.56* 2.01*  CALCIUM 8.8* 8.3*  MG  --  2.0  PHOS  --  5.8*   Liver Function Tests: Recent Labs  Lab 12/26/21 1202  AST 51*  ALT 28  ALKPHOS 75  BILITOT 0.5  PROT 7.1  ALBUMIN 3.7   Recent Labs  Lab 12/26/21 1202  LIPASE 45   No results for input(s): AMMONIA in the last 168 hours. CBC: Recent Labs  Lab 12/26/21 1307 12/27/21 0414  WBC 12.6* 12.1*  NEUTROABS 11.3*  --   HGB 10.8* 11.6*  HCT 36.2 37.4  MCV 102.3* 97.9  PLT 189 171   Cardiac Enzymes: Recent Labs  Lab 12/26/21 1307  CKTOTAL 127   BNP: Invalid input(s): POCBNP CBG: Recent Labs  Lab 12/26/21 1606  GLUCAP 104*        IMAGING RESULTS:  Imaging: DG Abdomen 1 View  Result Date: 12/26/2021 CLINICAL DATA:  Orogastric tube placement EXAM: ABDOMEN - 1 VIEW COMPARISON:  Chest radiograph 12/26/2021 and MRI abdomen 08/31/2020 FINDINGS: Pulmonary nodule versus nipple shadows in the lungs. Emphysema. Extensive atherosclerosis. Aortic Atherosclerosis (ICD10-I70.0). Possible left pleural thickening at the lung base, cannot exclude pleural effusion. L1 vertebral augmentation. Bony demineralization. The orogastric tube tip is in the stomach body with side port in the stomach body. IMPRESSION: 1. Orogastric tube tip and side port in the stomach body. 2. Possible left pleural effusion. 3. Emphysema and interstitial accentuation in the lungs. 4. Aortic Atherosclerosis (ICD10-I70.0) and Emphysema (ICD10-J43.9). 5. Nodularity in the mid lungs, nipple shadows versus true pulmonary nodules. Electronically Signed   By: Van Clines M.D.   On: 12/26/2021 12:52   CT Head Wo Contrast  Result Date: 12/26/2021 CLINICAL DATA:  Mental status change, persistent or worsening, intubated, ams EXAM: CT HEAD WITHOUT CONTRAST TECHNIQUE: Contiguous axial images were obtained from the base of the skull through the vertex without intravenous contrast. RADIATION DOSE REDUCTION: This exam was performed according to the departmental dose-optimization program which includes automated exposure control, adjustment of the mA and/or kV according to patient size and/or use of iterative reconstruction technique. COMPARISON:  2017 FINDINGS: Brain: There is no acute intracranial hemorrhage, mass effect, or edema. Gray-white differentiation is preserved. There is no extra-axial fluid collection. Prominence of the ventricles and sulci reflects minor parenchymal volume loss. Minimal patchy hypoattenuation in the supratentorial white matter is nonspecific but may reflect minor chronic microvascular  ischemic changes. Chronic small vessel infarct the  posterolateral right putamen. Vascular: There is atherosclerotic calcification at the skull base. Skull: Calvarium is unremarkable. Sinuses/Orbits: No acute finding. Other: None. IMPRESSION: No acute intracranial abnormality. Electronically Signed   By: Macy Mis M.D.   On: 12/26/2021 15:59   CT Angio Chest PE W and/or Wo Contrast  Result Date: 12/26/2021 CLINICAL DATA:  High clinical suspicion for pulmonary embolism EXAM: CT ANGIOGRAPHY CHEST WITH CONTRAST TECHNIQUE: Multidetector CT imaging of the chest was performed using the standard protocol during bolus administration of intravenous contrast. Multiplanar CT image reconstructions and MIPs were obtained to evaluate the vascular anatomy. RADIATION DOSE REDUCTION: This exam was performed according to the departmental dose-optimization program which includes automated exposure control, adjustment of the mA and/or kV according to patient size and/or use of iterative reconstruction technique. CONTRAST:  53m OMNIPAQUE IOHEXOL 350 MG/ML SOLN COMPARISON:  Chest radiograph done earlier today and CT chest done on 05/23/2020 FINDINGS: Cardiovascular: Coronary artery calcifications are seen. Dense calcification is seen in the mitral annulus. Contrast density in the thoracic aorta is less than adequate to evaluate the lumen. There is ectasia of ascending thoracic aorta measuring 3.5 cm. There is focal aneurysmal dilation in the proximal descending thoracic aorta measuring 4.2 cm in AP diameter which measured 4 cm in the previous study. Aortic arch measures 3 cm. Distal thoracic aorta measures 2.9 cm. There is dense calcification filling much of the lumen of proximal abdominal aorta. Mediastinum/Nodes: There are calcified nodules in the mediastinum. Enteric tube is noted traversing the esophagus. Tip of endotracheal tube is at the level of aortic arch. Lungs/Pleura: Severe centrilobular and panlobular emphysema is seen. There is decreased volume in the left lung.  There is dense atelectasis in the left lower lobe. There are faint ground-glass densities in the left upper lobe. Small patchy infiltrates are seen in the right lower lobe. In image 20 of series 7 there is 12 mm pleural-based nodular density in the posterior left upper lung fields which was not seen in the previous examination. Small to moderate bilateral pleural effusions are seen. There is no pneumothorax. Upper Abdomen: Distal portion of enteric tube is seen in the stomach. Musculoskeletal: There is decrease in height of few of the thoracic vertebral bodies and L1 vertebral body. There is previous vertebroplasty in the L1 vertebra. These findings have not changed significantly. Review of the MIP images confirms the above findings. IMPRESSION: There is no evidence of pulmonary artery embolism. There is decreased volume in the left lung with dense atelectasis in the left lower lobe. This may be caused by mucous plugging in the airways or central obstructing neoplastic process. Follow-up CT and endoscopy as warranted should be considered. Small patchy infiltrates are seen in the right lower lung fields suggesting subsegmental atelectasis. Small to moderate bilateral pleural effusions are seen. There is a new 12 mm pleural-based noncalcified density in the posterior left upper lung fields. This may suggest focal infectious process or scarring or neoplasm. This finding could also be re-evaluated in follow-up CT. 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 Extensive atherosclerotic changes are noted. There is a large calcific density filling much of the lumen of proximal abdominal aorta. Possibility of high-grade stenosis is not excluded. There is no adequate contrast enhancement in the systemic circulation limiting evaluation. Severe centrilobular and panlobular emphysema. Coronary artery calcifications are seen. Other findings as described in the body of the report. Electronically Signed   By:  PRoyston Cowper  Rathinasamy M.D.   On: 12/26/2021 16:19   DG Chest Port 1 View  Result Date: 12/26/2021 CLINICAL DATA:  Questionable sepsis. EXAM: PORTABLE CHEST 1 VIEW COMPARISON:  Radiograph January 14, 2018 FINDINGS: Endotracheal tube with tip projecting over the midthoracic trachea at the level of the aortic arch. Nasogastric tube courses below the diaphragm with tip and side port obscured by collimation. Additional extracorporeal leads overlie the chest and upper abdomen. Enlarged cardiac silhouette with central vascular prominence. Opacity in the left lung base with partial obscuration of left hemidiaphragm. Chronic bronchitic lung changes. No acute osseous abnormality. IMPRESSION: Opacity in the left lung base with partial obscuration of left hemidiaphragm. Could reflect an effusion and/or atelectasis/pneumonia. Electronically Signed   By: Dahlia Bailiff M.D.   On: 12/26/2021 12:53   @PROBHOSP @ DG Abdomen 1 View  Result Date: 12/26/2021 CLINICAL DATA:  Orogastric tube placement EXAM: ABDOMEN - 1 VIEW COMPARISON:  Chest radiograph 12/26/2021 and MRI abdomen 08/31/2020 FINDINGS: Pulmonary nodule versus nipple shadows in the lungs. Emphysema. Extensive atherosclerosis. Aortic Atherosclerosis (ICD10-I70.0). Possible left pleural thickening at the lung base, cannot exclude pleural effusion. L1 vertebral augmentation. Bony demineralization. The orogastric tube tip is in the stomach body with side port in the stomach body. IMPRESSION: 1. Orogastric tube tip and side port in the stomach body. 2. Possible left pleural effusion. 3. Emphysema and interstitial accentuation in the lungs. 4. Aortic Atherosclerosis (ICD10-I70.0) and Emphysema (ICD10-J43.9). 5. Nodularity in the mid lungs, nipple shadows versus true pulmonary nodules. Electronically Signed   By: Van Clines M.D.   On: 12/26/2021 12:52   CT Head Wo Contrast  Result Date: 12/26/2021 CLINICAL DATA:  Mental status change, persistent or worsening,  intubated, ams EXAM: CT HEAD WITHOUT CONTRAST TECHNIQUE: Contiguous axial images were obtained from the base of the skull through the vertex without intravenous contrast. RADIATION DOSE REDUCTION: This exam was performed according to the departmental dose-optimization program which includes automated exposure control, adjustment of the mA and/or kV according to patient size and/or use of iterative reconstruction technique. COMPARISON:  2017 FINDINGS: Brain: There is no acute intracranial hemorrhage, mass effect, or edema. Gray-white differentiation is preserved. There is no extra-axial fluid collection. Prominence of the ventricles and sulci reflects minor parenchymal volume loss. Minimal patchy hypoattenuation in the supratentorial white matter is nonspecific but may reflect minor chronic microvascular ischemic changes. Chronic small vessel infarct the posterolateral right putamen. Vascular: There is atherosclerotic calcification at the skull base. Skull: Calvarium is unremarkable. Sinuses/Orbits: No acute finding. Other: None. IMPRESSION: No acute intracranial abnormality. Electronically Signed   By: Macy Mis M.D.   On: 12/26/2021 15:59   CT Angio Chest PE W and/or Wo Contrast  Result Date: 12/26/2021 CLINICAL DATA:  High clinical suspicion for pulmonary embolism EXAM: CT ANGIOGRAPHY CHEST WITH CONTRAST TECHNIQUE: Multidetector CT imaging of the chest was performed using the standard protocol during bolus administration of intravenous contrast. Multiplanar CT image reconstructions and MIPs were obtained to evaluate the vascular anatomy. RADIATION DOSE REDUCTION: This exam was performed according to the departmental dose-optimization program which includes automated exposure control, adjustment of the mA and/or kV according to patient size and/or use of iterative reconstruction technique. CONTRAST:  48m OMNIPAQUE IOHEXOL 350 MG/ML SOLN COMPARISON:  Chest radiograph done earlier today and CT chest done on  05/23/2020 FINDINGS: Cardiovascular: Coronary artery calcifications are seen. Dense calcification is seen in the mitral annulus. Contrast density in the thoracic aorta is less than adequate to evaluate the lumen. There is ectasia of  ascending thoracic aorta measuring 3.5 cm. There is focal aneurysmal dilation in the proximal descending thoracic aorta measuring 4.2 cm in AP diameter which measured 4 cm in the previous study. Aortic arch measures 3 cm. Distal thoracic aorta measures 2.9 cm. There is dense calcification filling much of the lumen of proximal abdominal aorta. Mediastinum/Nodes: There are calcified nodules in the mediastinum. Enteric tube is noted traversing the esophagus. Tip of endotracheal tube is at the level of aortic arch. Lungs/Pleura: Severe centrilobular and panlobular emphysema is seen. There is decreased volume in the left lung. There is dense atelectasis in the left lower lobe. There are faint ground-glass densities in the left upper lobe. Small patchy infiltrates are seen in the right lower lobe. In image 20 of series 7 there is 12 mm pleural-based nodular density in the posterior left upper lung fields which was not seen in the previous examination. Small to moderate bilateral pleural effusions are seen. There is no pneumothorax. Upper Abdomen: Distal portion of enteric tube is seen in the stomach. Musculoskeletal: There is decrease in height of few of the thoracic vertebral bodies and L1 vertebral body. There is previous vertebroplasty in the L1 vertebra. These findings have not changed significantly. Review of the MIP images confirms the above findings. IMPRESSION: There is no evidence of pulmonary artery embolism. There is decreased volume in the left lung with dense atelectasis in the left lower lobe. This may be caused by mucous plugging in the airways or central obstructing neoplastic process. Follow-up CT and endoscopy as warranted should be considered. Small patchy infiltrates are  seen in the right lower lung fields suggesting subsegmental atelectasis. Small to moderate bilateral pleural effusions are seen. There is a new 12 mm pleural-based noncalcified density in the posterior left upper lung fields. This may suggest focal infectious process or scarring or neoplasm. This finding could also be re-evaluated in follow-up CT. 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 Extensive atherosclerotic changes are noted. There is a large calcific density filling much of the lumen of proximal abdominal aorta. Possibility of high-grade stenosis is not excluded. There is no adequate contrast enhancement in the systemic circulation limiting evaluation. Severe centrilobular and panlobular emphysema. Coronary artery calcifications are seen. Other findings as described in the body of the report. Electronically Signed   By: Elmer Picker M.D.   On: 12/26/2021 16:19   DG Chest Port 1 View  Result Date: 12/26/2021 CLINICAL DATA:  Questionable sepsis. EXAM: PORTABLE CHEST 1 VIEW COMPARISON:  Radiograph January 14, 2018 FINDINGS: Endotracheal tube with tip projecting over the midthoracic trachea at the level of the aortic arch. Nasogastric tube courses below the diaphragm with tip and side port obscured by collimation. Additional extracorporeal leads overlie the chest and upper abdomen. Enlarged cardiac silhouette with central vascular prominence. Opacity in the left lung base with partial obscuration of left hemidiaphragm. Chronic bronchitic lung changes. No acute osseous abnormality. IMPRESSION: Opacity in the left lung base with partial obscuration of left hemidiaphragm. Could reflect an effusion and/or atelectasis/pneumonia. Electronically Signed   By: Dahlia Bailiff M.D.   On: 12/26/2021 12:53        ASSESSMENT AND PLAN    -Multidisciplinary rounds held today  Acute Hypoxic Respiratory Failure -Present on admission unclear if this is due to CHF exacerbation versus  pneumonia. -She did have copious amounts of blood aspirated from the stomach and may have aspirated blood into the lungs if there is a GI bleed, CBC shows decrement  in hemoglobin from baseline 14-12 -Empirically will treat with Zithromax and Rocephin -Procalcitonin trending and MRSA PCR screen Blood cultures x2 -continue Full MV support -continue Bronchodilator Therapy -Wean Fio2 and PEEP as tolerated -will perform SAT/SBT when respiratory parameters are met   Severe protein calorie malnutrition    - plan for OG feeds today     - high risk for reefeding   Peptic ulcer disease    Protonix bid IV 40   Hemineglect   Possible CVA   - ct head was normal   - MRI brain ordered   NSTEMI    - with non-rheumatic MR/TR as well as possible new onset CHF   - cardiology consultation -previously seen by Dr.  Saralyn Pilar    - holding anticoagulation due to acute blood loss anemia with GI bleed per OGT -Cardiac biomarkers trending ICU telemetry monitoring  Renal Failure-most likely due to ATN -follow chem 7 -follow UO -continue Foley Catheter-assess need daily  ID -continue IV abx as prescibed -follow up cultures  GI/Nutrition GI PROPHYLAXIS as indicated DIET-->TF's as tolerated Constipation protocol as indicated  ENDO - ICU hypoglycemic\Hyperglycemia protocol -check FSBS per protocol   ELECTROLYTES -follow labs as needed -replace as needed -pharmacy consultation   DVT/GI PRX ordered -SCDs  TRANSFUSIONS AS NEEDED MONITOR FSBS ASSESS the need for LABS as needed   Critical care provider statement:    Critical care provider statement:   Total critical care time: 33 minutes   Performed by: Lanney Gins MD   Critical care time was exclusive of separately billable procedures and treating other patients.   Critical care was necessary to treat or prevent imminent or life-threatening deterioration.   Critical care was time spent personally by me on the following  activities: development of treatment plan with patient and/or surrogate as well as nursing, discussions with consultants, evaluation of patient's response to treatment, examination of patient, obtaining history from patient or surrogate, ordering and performing treatments and interventions, ordering and review of laboratory studies, ordering and review of radiographic studies, pulse oximetry and re-evaluation of patient's condition.    Ottie Glazier, M.D.  Pulmonary & Critical Care Medicine

## 2021-12-27 NOTE — Progress Notes (Signed)
  Echocardiogram 2D Echocardiogram has been performed.  [name] [name] M 12/27/2021, 3:13 PM

## 2021-12-27 NOTE — Progress Notes (Signed)
Patient extubated to 6l [place], tolerating well at this time. Sats are 90-91% (sat goals are for 88% or better).

## 2021-12-27 NOTE — Progress Notes (Signed)
Initial Nutrition Assessment  DOCUMENTATION CODES:   Severe malnutrition in context of chronic illness  INTERVENTION:   If tube feeds initiated:  Vital 1.2'@45ml'$ /hr- Initiate at 6m/hr and increase by 14mhr q 12 hrs until goal rate is reached.   Free water flushes 3076m4 hours to maintain tube patency   Regimen provides 1296kcal/day, 81g/day protein and 1056m28my of water   Juven Fruit Punch BID via tube, each serving provides 95kcal and 2.5g of protein (amino acids glutamine and arginine)  Liquid MVI daily via tube   Pt at high refeed risk; recommend monitor potassium, magnesium and phosphorus labs daily until stable  NUTRITION DIAGNOSIS:   Severe Malnutrition related to chronic illness (COPD, CHF) as evidenced by severe muscle depletion, severe fat depletion.  GOAL:   Patient will meet greater than or equal to 90% of their needs  MONITOR:   Vent status, Labs, Weight trends, TF tolerance, Skin, I & O's  REASON FOR ASSESSMENT:   Ventilator    ASSESSMENT:   76 y6 female with h/o COPD, anxiety, HLD, CVA, PUD, MDD, GERD, CHF, etoh abuse and CAD who is admitted with respiratory failure.  Pt sedated and ventilated. OGT in place. Pt undergoing SBTs currently. Plan is for possible extubation. Will initiate tube feeds if pt does not extubate. RD suspects pt with poor oral intake at baseline as pt with severe malnutrition. Pt is at high refeed risk. Per chart, pt appears weight stable at baseline.   Medications reviewed and include: colace, lasix, protonix, miralax, albumin, azithromycin, ceftriaxone  Labs reviewed: K 5.4(H), BUN 37(H), creat 2.01(H), P 5.8(H), Mg 2.0 wnl Wbc- 12.1(H)  Patient is currently intubated on ventilator support MV: 9.4 L/min Temp (24hrs), Avg:97.4 F (36.3 C), Min:92.4 F (33.6 C), Max:100.4 F (38 C)  Propofol: none   MAP- >65mm76m UOP- 720ml 27mTRITION - FOCUSED PHYSICAL EXAM:  Flowsheet Row Most Recent Value  Orbital Region  Severe depletion  Upper Arm Region Severe depletion  Thoracic and Lumbar Region Severe depletion  Buccal Region Severe depletion  Temple Region Severe depletion  Clavicle Bone Region Severe depletion  Clavicle and Acromion Bone Region Severe depletion  Scapular Bone Region Severe depletion  Dorsal Hand Severe depletion  Patellar Region Severe depletion  Anterior Thigh Region Severe depletion  Posterior Calf Region Severe depletion  Edema (RD Assessment) None  Hair Reviewed  Eyes Reviewed  Mouth Reviewed  Skin Reviewed  Nails Reviewed   Diet Order:   Diet Order             Diet NPO time specified  Diet effective now                  EDUCATION NEEDS:   No education needs have been identified at this time  Skin:  Skin Assessment: Reviewed RN Assessment (Stage I sacrum)  Last BM:  pta  Height:   Ht Readings from Last 1 Encounters:  12/27/21 5' 2.01" (1.575 m)    Weight:   Wt Readings from Last 1 Encounters:  12/27/21 39.8 kg    Ideal Body Weight:  50 kg  BMI:  Body mass index is 16.04 kg/m.  Estimated Nutritional Needs:   Kcal:  1243kcal/day  Protein:  65-75g/day  Fluid:  1.0-1.2L/day  Caidan Hubbert Koleen DistanceD, LDN Please refer to [facility] and/or RD on-call/weekend/after hours pager

## 2021-12-28 DIAGNOSIS — Z515 Encounter for palliative care: Secondary | ICD-10-CM | POA: Diagnosis not present

## 2021-12-28 DIAGNOSIS — Z7189 Other specified counseling: Secondary | ICD-10-CM

## 2021-12-28 DIAGNOSIS — I639 Cerebral infarction, unspecified: Secondary | ICD-10-CM | POA: Diagnosis not present

## 2021-12-28 DIAGNOSIS — J9601 Acute respiratory failure with hypoxia: Secondary | ICD-10-CM | POA: Diagnosis not present

## 2021-12-28 DIAGNOSIS — Z66 Do not resuscitate: Secondary | ICD-10-CM | POA: Diagnosis not present

## 2021-12-28 LAB — CBC
HCT: 36 % (ref 36.0–46.0)
Hemoglobin: 11.4 g/dL — ABNORMAL LOW (ref 12.0–15.0)
MCH: 30.1 pg (ref 26.0–34.0)
MCHC: 31.7 g/dL (ref 30.0–36.0)
MCV: 95 fL (ref 80.0–100.0)
Platelets: 183 10*3/uL (ref 150–400)
RBC: 3.79 MIL/uL — ABNORMAL LOW (ref 3.87–5.11)
RDW: 14.4 % (ref 11.5–15.5)
WBC: 12.7 10*3/uL — ABNORMAL HIGH (ref 4.0–10.5)
nRBC: 0 % (ref 0.0–0.2)

## 2021-12-28 LAB — BASIC METABOLIC PANEL
Anion gap: 10 (ref 5–15)
BUN: 58 mg/dL — ABNORMAL HIGH (ref 8–23)
CO2: 23 mmol/L (ref 22–32)
Calcium: 8.6 mg/dL — ABNORMAL LOW (ref 8.9–10.3)
Chloride: 109 mmol/L (ref 98–111)
Creatinine, Ser: 2.35 mg/dL — ABNORMAL HIGH (ref 0.44–1.00)
GFR, Estimated: 21 mL/min — ABNORMAL LOW (ref >60)
Glucose, Bld: 124 mg/dL — ABNORMAL HIGH (ref 70–99)
Potassium: 4.2 mmol/L (ref 3.5–5.1)
Sodium: 142 mmol/L (ref 135–145)

## 2021-12-28 LAB — MAGNESIUM: Magnesium: 2.2 mg/dL (ref 1.7–2.4)

## 2021-12-28 MED ORDER — THIAMINE HCL 100 MG/ML IJ SOLN
100.0000 mg | Freq: Every day | INTRAMUSCULAR | Status: DC
  Administered 2021-12-28: 100 mg via INTRAVENOUS
  Filled 2021-12-28: qty 2

## 2021-12-28 MED ORDER — LORAZEPAM 2 MG/ML PO CONC: 1.0000 mg | ORAL | Status: DC | PRN

## 2021-12-28 MED ORDER — HALOPERIDOL LACTATE 2 MG/ML PO CONC: 2.0000 mg | ORAL | Status: DC | PRN

## 2021-12-28 MED ORDER — LORAZEPAM 2 MG/ML IJ SOLN: 1.0000 mg | INTRAMUSCULAR | Status: DC | PRN

## 2021-12-28 MED ORDER — POLYVINYL ALCOHOL 1.4 % OP SOLN
1.0000 [drp] | Freq: Four times a day (QID) | OPHTHALMIC | Status: DC | PRN
Start: 2021-12-28 — End: 2021-12-29

## 2021-12-28 MED ORDER — HYDROMORPHONE HCL 1 MG/ML IJ SOLN
0.5000 mg | INTRAMUSCULAR | Status: DC | PRN
  Administered 2021-12-28 – 2021-12-29 (×5): 0.5 mg via INTRAVENOUS
  Filled 2021-12-28 (×5): qty 1

## 2021-12-28 MED ORDER — ASPIRIN 300 MG RE SUPP
300.0000 mg | Freq: Once | RECTAL | Status: AC
  Administered 2021-12-28: 300 mg via RECTAL
  Filled 2021-12-28: qty 1

## 2021-12-28 MED ORDER — BIOTENE DRY MOUTH MT LIQD
15.0000 mL | Freq: Three times a day (TID) | OROMUCOSAL | Status: DC
  Administered 2021-12-28 – 2021-12-29 (×3): 15 mL via TOPICAL

## 2021-12-28 MED ORDER — GLYCOPYRROLATE 0.2 MG/ML IJ SOLN
0.2000 mg | INTRAMUSCULAR | Status: DC | PRN
  Administered 2021-12-29: 0.2 mg via INTRAVENOUS
  Filled 2021-12-28: qty 1

## 2021-12-28 MED ORDER — HALOPERIDOL LACTATE 5 MG/ML IJ SOLN: 2.0000 mg | INTRAMUSCULAR | Status: DC | PRN

## 2021-12-28 MED ORDER — GLYCOPYRROLATE 0.2 MG/ML IJ SOLN: 0.2000 mg | INTRAMUSCULAR | Status: DC | PRN

## 2021-12-28 MED ORDER — ASPIRIN 300 MG RE SUPP: 150.0000 mg | Freq: Every day | RECTAL | Status: DC

## 2021-12-28 MED ORDER — LORAZEPAM 1 MG PO TABS: 1.0000 mg | ORAL_TABLET | ORAL | Status: DC | PRN

## 2021-12-28 MED ORDER — HALOPERIDOL 0.5 MG PO TABS: 2.0000 mg | ORAL_TABLET | ORAL | Status: DC | PRN

## 2021-12-28 MED ORDER — GLYCOPYRROLATE 1 MG PO TABS: 1.0000 mg | ORAL_TABLET | ORAL | Status: DC | PRN

## 2021-12-28 MED ORDER — FOLIC ACID 5 MG/ML IJ SOLN
1.0000 mg | Freq: Every day | INTRAMUSCULAR | Status: DC
  Administered 2021-12-28: 1 mg via INTRAVENOUS
  Filled 2021-12-28: qty 0.2

## 2021-12-28 MED ORDER — METOPROLOL TARTRATE 5 MG/5ML IV SOLN
5.0000 mg | Freq: Three times a day (TID) | INTRAVENOUS | Status: DC | PRN
  Administered 2021-12-28: 5 mg via INTRAVENOUS
  Filled 2021-12-28: qty 5

## 2021-12-28 NOTE — Progress Notes (Signed)
0730 attempted to do yale swallow test bedside patient unable to hold lips together patient unable to hold air in cheeks patient doesn't present as safe to feed or to do full yale swallow exam speech consulted.  0900 Daughter at bedside updated on speech eval and plan of care and the need for additional needs of nutrition based on patient current nutrition prior to hospital. Patients daughter [name] [name] states patient would not want peg tube placement. Education on corpak/dubhoff being used only as a temporary measure. Education on left side deficit patient needing full time total care. Daughter emotional at bedside states that patient seems weak and overall doesn't look well to her.

## 2021-12-28 NOTE — Progress Notes (Signed)
Nutrition Brief Note  Chart reviewed. Pt now transitioning to comfort care.  No further nutrition interventions planned at this time.  Please re-consult as needed.   Danniel Tones W, RD, LDN, CDCES Registered Dietitian II Certified Diabetes Care and Education Specialist Please refer to [facility] for RD and/or RD on-call/weekend/after hours pager   

## 2021-12-28 NOTE — Evaluation (Signed)
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  Some speech deficit.   Wears dentures    full upper and lower   Past Surgical History:  Past Surgical History:  Procedure Laterality Date   ABDOMINAL HYSTERECTOMY  1982   BACK SURGERY     CATARACT EXTRACTION W/PHACO Left 09/24/2021   Procedure: CATARACT EXTRACTION PHACO AND INTRAOCULAR LENS PLACEMENT ([place]) LEFT;  Surgeon: Eulogio Bear, MD;  Location: [place];  Service: Ophthalmology;  Laterality: Left;  3.80 0:31.7   CATARACT EXTRACTION W/PHACO Right 10/08/2021   Procedure: CATARACT EXTRACTION PHACO AND INTRAOCULAR LENS PLACEMENT ([facility]) RIGHT 3.84 00:35.3;  Surgeon: Eulogio Bear, MD;  Location: [place];  Service: Ophthalmology;  Laterality: Right;   COLONOSCOPY WITH PROPOFOL N/A 04/07/2018   Procedure: COLONOSCOPY WITH PROPOFOL;  Surgeon: [name], MD;  Location: [facility] ENDOSCOPY;  Service: Endoscopy;  Laterality: N/A;   KYPHOPLASTY N/A 10/23/2017   Procedure: [id number]-I1;  Surgeon: Hessie Knows, MD;  Location: [facility] ORS;  Service: Orthopedics;  Laterality: N/A;   HPI:  Pt is a 76 y/o female with h/o COPD, anxiety, HLD, CVA, PUD, MDD, GERD, CHF, ETOH abuse and CAD who is admitted with respiratory failure and in an unresponsive state.  According to the daughter, she was speaking with the patient on the phone, when the  patient became short of breath.  Went to check on her, at which time the patient was unresponsive, EMS was called, and was brought to [facility] ED where she was noted to be hypoxic, intubated and placed on mechanical ventilator.  Chest x-ray revealed possible left lung pneumonia.  Chest CT did not reveal evidence for pulmonary embolus.  CT scan did reveal left lung dense atelectasis left lower lobe which may have been caused by mucous plugging, a new 12 mm pleural-based noncalcified density posterior left upper lung suggesting focal infectious process.  MRI revealed: Acute nonhemorrhagic infarct involving the high medial right  frontal lobe, distal right ACA territory.  2. Remote lacunar infarcts of the posterior lentiform nucleus  bilaterally and thalami bilaterally.  3. Periventricular and subcortical T2 hyperintensities bilaterally are mildly Advanced for age. This likely reflects the sequela of  chronic microvascular ischemia.   Pt was extubated on 12/27/2021, now on [place] O2.  RD suspects pt with poor oral intake at baseline as pt with severe Malnutrition.    Assessment / Plan / Recommendation  Clinical Impression   Pt seen for BSE this morning, Family present. Pt was awake, verbal and responded to direct/basic questions re: herself and engaged in po tasks w/ this Clinician -- min HOH. Speech was intelligible during engagement w/ this Clinician and w/ Dtrs though pt is Not wearing her Dentures currently.  This was somewhat of a limited assessment d/t pt's oropharyngeal phase dysphagia and decline of Nectar liquids; solid foods and thin liquids were not assessed.  She appeared weak and frail. Pt tended to lean to her L side and required MOD+ positioning support to achieve Midline positioning w/ head forward. Noted the MRI results; Left sided U/L weakness present. Pt was recently extubated on 12/27/21 post x2 days of oral intubation s/p unresponsiveness and respiratory decline. Pt has medical status issues and overall  decline for which MD has consulted Palliative Care for consult w/ Family for discussion of GOC.    Per presentation at this BSE, pt presents w/ oropharyngeal phase dysphagia and is at increased risk for aspiration current time in setting of new CVA, chronic illness, and hospitalization. She appears weak and frail; there is concern for delayed timing of pharyngeal swallow thus airway protection as well as pharyngeal residue post the swallow.  Pt accepted few po trials w/ this Clinician -- ice chips-3, Nectar liquids via tsp-3, Applesauce(puree)-5. She made faces w/ the ice chip and Nectar liquids by tsp trials. Subtle, overt s/s of aspiration noted w/ po trials of Nectar liquids c/b decreased hyolaryngeal excursion, throat clearing, and multiple swallows. Similar was noted w/ trials of puree w/ less audible swallowing and less throat clearing. Suspect potential pharyngeal residue post initial swallow thus ris for penetration/aspiration AFTER the swallow. This presentation was not noted w/ the few ice chip trials. No respiratory decline was witnessed; pt's breathing remained fairly unlabored as at her baseline -- O2 sats 90-93; RR 19-26 w/ return to baseline w/ rest breaks b/t trials. Vocal quality was clear when she spoke b/t trials; reduced volume/intensity present.  During the oral phase, pt appeared to maintain adequate bolus management w/ the trial consistencies w/ A-P transfer, swallow, and oral clearing b/t trials. OM exam revealed generalized lingual weakness(laterally and during protrusion) w/ No unilateral weakness noted. Gag reflex +.   In setting of acute medical issues and presentation as discussed above, recommend NPO status for an oral diet w/ Therapeutic/Pleasure bites of Applesauce to engage oropharyngeal swallowing musculature/function. Recommend 100% Supervision w/ Applesauce trials; oral care prior to trials; strict aspiration precautions.  Recommend full discussion b/t Family, MD, and  Palliative Care for [place] re: QOL as pt is at increased risk for aspiration especially w/ oral intake which can then impact/further Pulmonary decline.  The above was explained to Daughters in room; much education was given on pt's dysphagia, alternative means of feeding routes, feeding support and Positioning, aspiration precautions. Handouts and precautions posted in room. ST services will f/u w/ pt's status tomorrow for any further assessment and consideration of an oral diet.  The above was discussed w/ NSG and Palliative Care services who will continue to address pt's [place] w/ Family. No oral diet at this time -- therapeutic/Pleasure tsps of Applesauce w/ NSG Supervision initiated today w/ strict aspiration precautions SLP Visit Diagnosis: Dysphagia, oropharyngeal phase (R13.12) (new CVA w/ left sided weakness)    Aspiration Risk  Moderate aspiration risk;Risk for inadequate nutrition/hydration    Diet Recommendation   NPO for oral diet - herapeutic/Pleasure tsps of Applesauce w/ NSG Supervision initiated today w/ strict aspiration precautions  Medication Administration: Via alternative means    Other  Recommendations Recommended Consults:  (Palliative Care consult for [place]; Dietician f/u) Oral Care Recommendations: Oral care BID;Oral care before and after PO;Staff/trained caregiver to provide oral care (support) Other Recommendations:  (TBD)    Recommendations for follow up therapy are one component of a multi-disciplinary discharge planning process, led by the attending physician.  Recommendations may be updated based on patient status, additional functional  criteria and insurance authorization.  Follow up Recommendations Skilled nursing-short term rehab (<3 hours/day) (TBD)      Assistance Recommended at Discharge Frequent or constant Supervision/Assistance  Functional Status Assessment Patient has had a recent decline in their functional status and/or demonstrates limited ability to make  significant improvements in function in a reasonable and predictable amount of time  Frequency and Duration min 2x/week  2 weeks       Prognosis Prognosis for Safe Diet Advancement: Guarded Barriers to Reach Goals: Cognitive deficits;Time post onset;Severity of deficits Barriers/Prognosis Comment: declined medical and pulmonary status'      Swallow Study   General Date of Onset: 12/26/21 HPI: Pt is a 76 y/o female with h/o COPD, anxiety, HLD, CVA, PUD, MDD, GERD, CHF, ETOH abuse and CAD who is admitted with respiratory failure and in an unresponsive state.  According to the daughter, she was speaking with the patient on the phone, when the patient became short of breath.  Went to check on her, at which time the patient was unresponsive, EMS was called, and was brought to [facility] ED where she was noted to be hypoxic, intubated and placed on mechanical ventilator.  Chest x-ray revealed possible left lung pneumonia.  Chest CT did not reveal evidence for pulmonary embolus.  CT scan did reveal left lung dense atelectasis left lower lobe which may have been caused by mucous plugging, a new 12 mm pleural-based noncalcified density posterior left upper lung suggesting focal infectious process.  MRI revealed: Acute nonhemorrhagic infarct involving the high medial right  frontal lobe, distal right ACA territory.  2. Remote lacunar infarcts of the posterior lentiform nucleus  bilaterally and thalami bilaterally.  3. Periventricular and subcortical T2 hyperintensities bilaterally are mildly Advanced for age. This likely reflects the sequela of  chronic microvascular ischemia.   Pt was extubated on 12/27/2021, now on [place] O2.  RD suspects pt with poor oral intake at baseline as pt with severe Malnutrition. Type of Study: Bedside Swallow Evaluation Previous Swallow Assessment: none Diet Prior to this Study: NPO Temperature Spikes Noted: No (wbc elevated; Troponin elevated) Respiratory Status: Nasal cannula  (5L) History of Recent Intubation: Yes Length of Intubations (days): 2 days Date extubated: 12/27/21 Behavior/Cognition: Alert;Cooperative;Pleasant mood;Confused;Distractible;Requires cueing (HOH) Oral Cavity Assessment: Dry (min) Oral Care Completed by SLP: Yes Oral Cavity - Dentition: Edentulous (not wearing her dentures currently) Vision:  (n/a) Self-Feeding Abilities: Total assist Patient Positioning: Upright in bed (full positioning needed for head forward, upright sitting) Baseline Vocal Quality: Low vocal intensity (fully intelligible) Volitional Cough: Congested;Weak Volitional Swallow: Unable to elicit    Oral/Motor/Sensory Function Overall Oral Motor/Sensory Function: Mild impairment Facial ROM: Within Functional Limits Facial Symmetry: Within Functional Limits Lingual ROM: Within Functional Limits Lingual Symmetry: Within Functional Limits Lingual Strength: Within Functional Limits;Reduced (lateral sides, protrusion) Velum: Within Functional Limits Mandible: Within Functional Limits   Ice Chips Ice chips: Within functional limits (adequate) Presentation: Spoon (fed; 3 trials)   Thin Liquid Thin Liquid: Not tested    Nectar Thick Nectar Thick Liquid: Impaired Presentation: Spoon (fed; 3 trials accepted) Oral Phase Impairments:  (adequate) Pharyngeal Phase Impairments: Suspected delayed Swallow;Decreased hyoid-laryngeal movement;Throat Clearing - Delayed;Multiple swallows (Audible swallows; residue issue) Other Comments: no decline in ANS   Honey Thick Honey Thick Liquid: Not tested   Puree Puree: Impaired (min) Presentation: Spoon (fed; 5 trials) Oral Phase Impairments:  (adequate) Pharyngeal Phase Impairments: Suspected delayed Swallow;Decreased hyoid-laryngeal movement;Multiple swallows;Throat Clearing - Delayed (suspect residue issue) Other Comments: no decline in  ANS   Solid     Solid: Not tested        Orinda Kenner, MS, CCC-SLP Speech Language  Pathologist Rehab Services; [place] [phone] (ascom) Adryanna Friedt 12/28/2021,2:11 PM

## 2021-12-28 NOTE — Progress Notes (Signed)
CRITICAL CARE PROGRESS NOTE    Name: [name] [name] MRN: [id number] DOB: 12-05-1945     LOS: 2   SUBJECTIVE FINDINGS & SIGNIFICANT EVENTS    Patient description:  This is a 76 yo F with hx of advanced COPD, anxiety disorder, dyslipidemia, osteoporosis, history of previous CVA who came in an unresponsive state.  History was obtained from her daughter who is present during examination.  Daughter states that she was speaking with the patient on the phone and noted labored breathing subsequently she drove over to her home to check on her and found her to be unresponsive.  Daughter was attempting to redirect and arouse patient however was unable to do so and called EMS due to unresponsive state.  In the ED patient was found to be hypoxemic was intubated placed on mechanical ventilation.  She had OG tube placed and noted suctioning of blood.  Her blood work including CBC was abnormal showing a decrement in hemoglobin from baseline of 12.4-10.8 with macrocytic anemia, she does have leukocytosis with white count of 12.6.  Cardiac biomarkers noted to be trending up over 1200, and BNP over 2000.  She had a chest x-ray performed which showed possible left lung pneumonia.  Also had CT head ordered and CT angio of the chest which are in process during my evaluation.  Pulmonary critical care service called for admission to medical intensive care unit.   12/27/21- patient seen at bedside, shes more awake and able to move but now exhibiting left sided weakness.  We have ordered MRI brain , she does have renal impairment. She had bronchoscopy to unplug severe L lung mucus plugging and is now oxygenating better with plan for SBT and potential liberation from MV.   12/28/21- patient remains critically ill with hemiparesis.  Was successfully extubated  and doing well on 5L/[place].  Neuro and cardio following. GOC with family yesterday patient is DNR/DNI.  She does not wish for PEG tube nourishment. Palliative care evaluation today. TRH transfer to med/surg   Lines/tubes : NG/OG Vented/Dual Lumen 16 Fr. Oral Marking at nare/corner of mouth 60 cm (Active)  Tube Position (Required) Marking at nare/corner of mouth 12/26/21 1233  Measurement (cm) (Required) 60 cm 12/26/21 1233  Ongoing Placement Verification (Required) (See row information) Yes 12/26/21 1233  Site Assessment Clean, Dry, Intact;Tape intact 12/26/21 1233  Interventions X-ray 12/26/21 1233  Status Low continuous suction 12/26/21 1233  Amount of suction 80 mmHg 12/26/21 1233     Urethral Catheter Jacque RN Temperature probe 16 Fr. (Active)  Indication for Insertion or Continuance of Catheter Unstable critically ill patients first 24-48 hours (See Criteria) 12/26/21 1237  Site Assessment Clean, Dry, Intact 12/26/21 1237  Catheter Maintenance Bag below level of bladder;Catheter secured;Drainage bag/tubing not touching floor;No dependent loops;Seal intact 12/26/21 1237  Collection Container Standard drainage bag 12/26/21 1237  Securement Method Securing device (Describe) 12/26/21 1237    Microbiology/Sepsis markers: Results for orders placed or performed during the hospital encounter of 12/26/21  Blood Culture (routine x 2)     Status: None (Preliminary result)   Collection Time: 12/26/21 12:02 PM   Specimen: BLOOD  Result Value Ref Range Status   Specimen Description BLOOD LEFT ANTECUBITAL  Final   Special Requests   Final    BOTTLES DRAWN AEROBIC AND ANAEROBIC Blood Culture adequate volume   Culture   Final    NO GROWTH 2 DAYS Performed at [facility], [address], [place], [place] [address]  Report Status PENDING  Incomplete  Blood Culture (routine x 2)     Status: None (Preliminary result)   Collection Time: 12/26/21 12:02 PM   Specimen: BLOOD  Result  Value Ref Range Status   Specimen Description BLOOD BLOOD RIGHT WRIST  Final   Special Requests   Final    BOTTLES DRAWN AEROBIC AND ANAEROBIC Blood Culture adequate volume   Culture   Final    NO GROWTH 2 DAYS Performed at [facility], [address] [place], [place] [address]    Report Status PENDING  Incomplete  Resp Panel by RT-PCR (Flu A&B, Covid) Anterior Nasal Swab     Status: None   Collection Time: 12/26/21 12:27 PM   Specimen: Anterior Nasal Swab  Result Value Ref Range Status   SARS Coronavirus 2 by RT PCR NEGATIVE NEGATIVE Final    Comment: (NOTE) SARS-CoV-2 target nucleic acids are NOT DETECTED.  The SARS-CoV-2 RNA is generally detectable in upper respiratory specimens during the acute phase of infection. The lowest concentration of SARS-CoV-2 viral copies this assay can detect is 138 copies/mL. A negative result does not preclude SARS-Cov-2 infection and should not be used as the sole basis for treatment or other patient management decisions. A negative result may occur with  improper specimen collection/handling, submission of specimen other than nasopharyngeal swab, presence of viral mutation(s) within the areas targeted by this assay, and inadequate number of viral copies(<138 copies/mL). A negative result must be combined with clinical observations, patient history, and epidemiological information. The expected result is Negative.  Fact Sheet for Patients:  EntrepreneurPulse.com.au  Fact Sheet for Healthcare Providers:  IncredibleEmployment.be  This test is no t yet approved or cleared by the [place] FDA and  has been authorized for detection and/or diagnosis of SARS-CoV-2 by FDA under an Emergency Use Authorization (EUA). This EUA will remain  in effect (meaning this test can be used) for the duration of the COVID-19 declaration under Section 564(b)(1) of the Act, 21 U.S.C.section 360bbb-3(b)(1), unless  the authorization is terminated  or revoked sooner.       Influenza A by PCR NEGATIVE NEGATIVE Final   Influenza B by PCR NEGATIVE NEGATIVE Final    Comment: (NOTE) The Xpert Xpress SARS-CoV-2/FLU/RSV plus assay is intended as an aid in the diagnosis of influenza from Nasopharyngeal swab specimens and should not be used as a sole basis for treatment. Nasal washings and aspirates are unacceptable for Xpert Xpress SARS-CoV-2/FLU/RSV testing.  Fact Sheet for Patients: EntrepreneurPulse.com.au  Fact Sheet for Healthcare Providers: IncredibleEmployment.be  This test is not yet approved or cleared by the [place] FDA and has been authorized for detection and/or diagnosis of SARS-CoV-2 by FDA under an Emergency Use Authorization (EUA). This EUA will remain in effect (meaning this test can be used) for the duration of the COVID-19 declaration under Section 564(b)(1) of the Act, 21 U.S.C. section 360bbb-3(b)(1), unless the authorization is terminated or revoked.  Performed at [facility], [address] [place], [place] [address]   MRSA Next Gen by PCR, Nasal     Status: None   Collection Time: 12/26/21  4:01 PM   Specimen: Nasal Mucosa; Nasal Swab  Result Value Ref Range Status   MRSA by PCR Next Gen NOT DETECTED NOT DETECTED Final    Comment: (NOTE) The GeneXpert MRSA Assay (FDA approved for NASAL specimens only), is one component of a comprehensive MRSA colonization surveillance program. It is not intended to diagnose MRSA infection nor to  guide or monitor treatment for MRSA infections. Test performance is not FDA approved in patients less than 54 years old. Performed at [facility], [address] [place], [place] [address]   Culture, Respiratory w Gram Stain     Status: None (Preliminary result)   Collection Time: 12/27/21 10:45 AM   Specimen: Bronchoalveolar Lavage; Respiratory  Result Value Ref Range Status    Specimen Description   Final    BRONCHIAL ALVEOLAR LAVAGE Performed at [facility], [address] [place], [place] [address]    Special Requests   Final    NONE Performed at [facility], [address] [place], [place] [address]    Gram Stain   Final    RARE WBC PRESENT, PREDOMINANTLY PMN RARE GRAM POSITIVE COCCI IN PAIRS RARE GRAM POSITIVE RODS    Culture   Final    CULTURE REINCUBATED FOR BETTER GROWTH Performed at [facility], [place] [address], [place],  [address]    Report Status PENDING  Incomplete    Anti-infectives:  Anti-infectives (From admission, onward)    Start     Dose/Rate Route Frequency Ordered Stop   12/26/21 1630  azithromycin (ZITHROMAX) 500 mg in sodium chloride 0.9 % 250 mL IVPB        500 mg 250 mL/hr over 60 Minutes Intravenous Every 24 hours 12/26/21 1549     12/26/21 1630  cefTRIAXone (ROCEPHIN) 1 g in sodium chloride 0.9 % 100 mL IVPB        1 g 200 mL/hr over 30 Minutes Intravenous Every 24 hours 12/26/21 1549         PAST MEDICAL HISTORY   Past Medical History:  Diagnosis Date   Anxiety    CAD (coronary artery disease) unk   Chronic pain    COPD (chronic obstructive pulmonary disease) (HCC)    Depression    GERD (gastroesophageal reflux disease)    Hypercholesteremia unk   Hypertension    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  Some speech deficit.   Wears dentures    full upper and lower     SURGICAL HISTORY   Past Surgical History:  Procedure Laterality Date   ABDOMINAL HYSTERECTOMY  1982   BACK SURGERY     CATARACT EXTRACTION W/PHACO Left 09/24/2021   Procedure: CATARACT EXTRACTION PHACO AND INTRAOCULAR LENS PLACEMENT ([place]) LEFT;  Surgeon: Eulogio Bear, MD;  Location: [facility];  Service: Ophthalmology;  Laterality: Left;  3.80 0:31.7   CATARACT EXTRACTION W/PHACO Right 10/08/2021   Procedure: CATARACT EXTRACTION  PHACO AND INTRAOCULAR LENS PLACEMENT ([facility]) RIGHT 3.84 00:35.3;  Surgeon: Eulogio Bear, MD;  Location: [facility];  Service: Ophthalmology;  Laterality: Right;   COLONOSCOPY WITH PROPOFOL N/A 04/07/2018   Procedure: COLONOSCOPY WITH PROPOFOL;  Surgeon: [name], MD;  Location: [facility] ENDOSCOPY;  Service: Endoscopy;  Laterality: N/A;   KYPHOPLASTY N/A 10/23/2017   Procedure: [id number];  Surgeon: Hessie Knows, MD;  Location: [facility];  Service: Orthopedics;  Laterality: N/A;     FAMILY HISTORY   Family History  Problem Relation Age of Onset   Leukemia Mother    Aneurysm Father    Diabetes Father    Heart disease Father    Liver cancer Sister    Cancer Sister    Lung cancer Sister        metastatic   Diabetes Maternal Grandmother  Diabetes Paternal Grandmother      SOCIAL HISTORY   Social History   Tobacco Use   Smoking status: Every Day    Packs/day: 0.50    Years: 53.00    Pack years: 26.50    Types: Cigarettes   Smokeless tobacco: Never   Tobacco comments:    Since age 36  Vaping Use   Vaping Use: Never used  Substance Use Topics   Alcohol use: Not Currently    Alcohol/week: 2.0 standard drinks    Types: 2 Glasses of wine per week   Drug use: Not Currently    Types: Marijuana    Comment: in the past     MEDICATIONS   Current Medication:  Current Facility-Administered Medications:    albuterol (PROVENTIL) (2.5 MG/3ML) 0.083% nebulizer solution 2.5 mg, 2.5 mg, Nebulization, Q4H PRN, Lanney Gins, Zevin Nevares, MD, 2.5 mg at 12/28/21 0833   azithromycin (ZITHROMAX) 500 mg in sodium chloride 0.9 % 250 mL IVPB, 500 mg, Intravenous, Q24H, Lanney Gins, Lizvet Chunn, MD, Stopped at 12/27/21 1734   cefTRIAXone (ROCEPHIN) 1 g in sodium chloride 0.9 % 100 mL IVPB, 1 g, Intravenous, Q24H, Ottie Glazier, MD, Stopped at 12/27/21 1618   Chlorhexidine Gluconate Cloth 2 % PADS 6 each, 6 each, Topical, Q0600, Ottie Glazier, MD, 6 each at 12/27/21 2233   docusate (COLACE)  50 MG/5ML liquid 100 mg, 100 mg, Per Tube, BID PRN, Ottie Glazier, MD   docusate (COLACE) 50 MG/5ML liquid 100 mg, 100 mg, Per Tube, BID, Darel Hong D, NP, 100 mg at 14/97/02 6378   folic acid (FOLVITE) tablet 1 mg, 1 mg, Per Tube, Daily, Dallie Piles, RPH, 1 mg at 12/27/21 1540   hydrALAZINE (APRESOLINE) injection 10 mg, 10 mg, Intravenous, Q6H PRN, Darel Hong D, NP, 10 mg at 12/28/21 0201   ipratropium-albuterol (DUONEB) 0.5-2.5 (3) MG/3ML nebulizer solution 3 mL, 3 mL, Nebulization, Once, Delman Kitten, MD   lidocaine (LIDODERM) 5 % 1 patch, 1 patch, Transdermal, Q24H, Lanney Gins, Gaylin Osoria, MD, 1 patch at 12/27/21 1802   multivitamin with minerals tablet 1 tablet, 1 tablet, Per Tube, Daily, Dallie Piles, RPH, 1 tablet at 12/27/21 1540   pantoprazole (PROTONIX) injection 40 mg, 40 mg, Intravenous, Q12H, Darel Hong D, NP, 40 mg at 12/27/21 2125   polyethylene glycol (MIRALAX / GLYCOLAX) packet 17 g, 17 g, Per Tube, Daily PRN, Ottie Glazier, MD   polyethylene glycol (MIRALAX / GLYCOLAX) packet 17 g, 17 g, Per Tube, Daily, Darel Hong D, NP, 17 g at 12/27/21 5885   thiamine tablet 100 mg, 100 mg, Per Tube, Daily, Dallie Piles, RPH, 100 mg at 12/27/21 1540    ALLERGIES   Patient has no known allergies.    REVIEW OF SYSTEMS    Unable to obtain due to acute CVA  PHYSICAL EXAMINATION   Vital Signs: Temp:  [97.9 F (36.6 C)-99.3 F (37.4 C)] 98.1 F (36.7 C) (06/02 0724) Pulse Rate:  [89-115] 95 (06/02 0800) Resp:  [11-32] 11 (06/02 0800) BP: (132-185)/(53-141) 142/54 (06/02 0800) SpO2:  [85 %-100 %] 97 % (06/02 0800) FiO2 (%):  [32 %-40 %] 32 % (06/01 1513) Weight:  [40 kg] 40 kg (06/02 0500)  GENERAL: Age-appropriate NAD HEAD: Normocephalic, atraumatic.  EYES: Pupils equal, round, reactive to light.  No scleral icterus.  MOUTH: Moist mucosal membrane. NECK: Supple. No thyromegaly. No nodules. No JVD.  PULMONARY: Crackles bilaterally worse on the  left CARDIOVASCULAR: S1 and S2. Regular rate and rhythm. No murmurs, rubs, or gallops.  GASTROINTESTINAL: Soft, nontender, non-distended. No masses. Positive bowel sounds. No hepatosplenomegaly.  MUSCULOSKELETAL: No swelling, clubbing, or edema.  NEUROLOGIC: hemiparesis s/p CVA SKIN:intact,warm,dry   PERTINENT DATA     Infusions:  azithromycin Stopped (12/27/21 1734)   cefTRIAXone (ROCEPHIN)  IV Stopped (12/27/21 1618)   Scheduled Medications:  Chlorhexidine Gluconate Cloth  6 each Topical Q0600   docusate  100 mg Per Tube BID   folic acid  1 mg Per Tube Daily   ipratropium-albuterol  3 mL Nebulization Once   lidocaine  1 patch Transdermal Q24H   multivitamin with minerals  1 tablet Per Tube Daily   pantoprazole (PROTONIX) IV  40 mg Intravenous Q12H   polyethylene glycol  17 g Per Tube Daily   thiamine  100 mg Per Tube Daily   PRN Medications: albuterol, docusate, hydrALAZINE, polyethylene glycol Hemodynamic parameters:   Intake/Output: 06/01 0701 - 06/02 0700 In: 435.5 [IV Piggyback:435.5] Out: 1761 [Urine:1090]  Ventilator  Settings: Vent Mode: PSV FiO2 (%):  [32 %-40 %] 32 % Set Rate:  [16 bmp] 16 bmp Vt Set:  [400 mL] 400 mL PEEP:  [5 cmH20] 5 cmH20 Pressure Support:  [5 cmH20] 5 cmH20    LAB RESULTS:  Basic Metabolic Panel: Recent Labs  Lab 12/26/21 1202 12/27/21 0414 12/27/21 1250 12/28/21 0416  NA 137 139  --  142  K 4.6 5.4* 4.5 4.2  CL 103 109  --  109  CO2 23 23  --  23  GLUCOSE 112* 136*  --  124*  BUN 27* 37*  --  58*  CREATININE 1.56* 2.01*  --  2.35*  CALCIUM 8.8* 8.3*  --  8.6*  MG  --  2.0  --  2.2  PHOS  --  5.8*  --   --     Liver Function Tests: Recent Labs  Lab 12/26/21 1202  AST 51*  ALT 28  ALKPHOS 75  BILITOT 0.5  PROT 7.1  ALBUMIN 3.7    Recent Labs  Lab 12/26/21 1202  LIPASE 45    No results for input(s): AMMONIA in the last 168 hours. CBC: Recent Labs  Lab 12/26/21 1307 12/27/21 0414 12/28/21 0416   WBC 12.6* 12.1* 12.7*  NEUTROABS 11.3*  --   --   HGB 10.8* 11.6* 11.4*  HCT 36.2 37.4 36.0  MCV 102.3* 97.9 95.0  PLT 189 171 183    Cardiac Enzymes: Recent Labs  Lab 12/26/21 1307  CKTOTAL 127    BNP: Invalid input(s): POCBNP CBG: Recent Labs  Lab 12/26/21 1606  GLUCAP 104*        IMAGING RESULTS:  Imaging: DG Abdomen 1 View  Result Date: 12/26/2021 CLINICAL DATA:  Orogastric tube placement EXAM: ABDOMEN - 1 VIEW COMPARISON:  Chest radiograph 12/26/2021 and MRI abdomen 08/31/2020 FINDINGS: Pulmonary nodule versus nipple shadows in the lungs. Emphysema. Extensive atherosclerosis. Aortic Atherosclerosis (ICD10-I70.0). Possible left pleural thickening at the lung base, cannot exclude pleural effusion. L1 vertebral augmentation. Bony demineralization. The orogastric tube tip is in the stomach body with side port in the stomach body. IMPRESSION: 1. Orogastric tube tip and side port in the stomach body. 2. Possible left pleural effusion. 3. Emphysema and interstitial accentuation in the lungs. 4. Aortic Atherosclerosis (ICD10-I70.0) and Emphysema (ICD10-J43.9). 5. Nodularity in the mid lungs, nipple shadows versus true pulmonary nodules. Electronically Signed   By: Van Clines M.D.   On: 12/26/2021 12:52   CT Head Wo Contrast  Result Date: 12/26/2021 CLINICAL DATA:  Mental status  change, persistent or worsening, intubated, ams EXAM: CT HEAD WITHOUT CONTRAST TECHNIQUE: Contiguous axial images were obtained from the base of the skull through the vertex without intravenous contrast. RADIATION DOSE REDUCTION: This exam was performed according to the departmental dose-optimization program which includes automated exposure control, adjustment of the mA and/or kV according to patient size and/or use of iterative reconstruction technique. COMPARISON:  2017 FINDINGS: Brain: There is no acute intracranial hemorrhage, mass effect, or edema. Gray-white differentiation is preserved.  There is no extra-axial fluid collection. Prominence of the ventricles and sulci reflects minor parenchymal volume loss. Minimal patchy hypoattenuation in the supratentorial white matter is nonspecific but may reflect minor chronic microvascular ischemic changes. Chronic small vessel infarct the posterolateral right putamen. Vascular: There is atherosclerotic calcification at the skull base. Skull: Calvarium is unremarkable. Sinuses/Orbits: No acute finding. Other: None. IMPRESSION: No acute intracranial abnormality. Electronically Signed   By: Macy Mis M.D.   On: 12/26/2021 15:59   CT Angio Chest PE W and/or Wo Contrast  Result Date: 12/26/2021 CLINICAL DATA:  High clinical suspicion for pulmonary embolism EXAM: CT ANGIOGRAPHY CHEST WITH CONTRAST TECHNIQUE: Multidetector CT imaging of the chest was performed using the standard protocol during bolus administration of intravenous contrast. Multiplanar CT image reconstructions and MIPs were obtained to evaluate the vascular anatomy. RADIATION DOSE REDUCTION: This exam was performed according to the departmental dose-optimization program which includes automated exposure control, adjustment of the mA and/or kV according to patient size and/or use of iterative reconstruction technique. CONTRAST:  26m OMNIPAQUE IOHEXOL 350 MG/ML SOLN COMPARISON:  Chest radiograph done earlier today and CT chest done on 05/23/2020 FINDINGS: Cardiovascular: Coronary artery calcifications are seen. Dense calcification is seen in the mitral annulus. Contrast density in the thoracic aorta is less than adequate to evaluate the lumen. There is ectasia of ascending thoracic aorta measuring 3.5 cm. There is focal aneurysmal dilation in the proximal descending thoracic aorta measuring 4.2 cm in AP diameter which measured 4 cm in the previous study. Aortic arch measures 3 cm. Distal thoracic aorta measures 2.9 cm. There is dense calcification filling much of the lumen of proximal  abdominal aorta. Mediastinum/Nodes: There are calcified nodules in the mediastinum. Enteric tube is noted traversing the esophagus. Tip of endotracheal tube is at the level of aortic arch. Lungs/Pleura: Severe centrilobular and panlobular emphysema is seen. There is decreased volume in the left lung. There is dense atelectasis in the left lower lobe. There are faint ground-glass densities in the left upper lobe. Small patchy infiltrates are seen in the right lower lobe. In image 20 of series 7 there is 12 mm pleural-based nodular density in the posterior left upper lung fields which was not seen in the previous examination. Small to moderate bilateral pleural effusions are seen. There is no pneumothorax. Upper Abdomen: Distal portion of enteric tube is seen in the stomach. Musculoskeletal: There is decrease in height of few of the thoracic vertebral bodies and L1 vertebral body. There is previous vertebroplasty in the L1 vertebra. These findings have not changed significantly. Review of the MIP images confirms the above findings. IMPRESSION: There is no evidence of pulmonary artery embolism. There is decreased volume in the left lung with dense atelectasis in the left lower lobe. This may be caused by mucous plugging in the airways or central obstructing neoplastic process. Follow-up CT and endoscopy as warranted should be considered. Small patchy infiltrates are seen in the right lower lung fields suggesting subsegmental atelectasis. Small to moderate bilateral pleural  effusions are seen. There is a new 12 mm pleural-based noncalcified density in the posterior left upper lung fields. This may suggest focal infectious process or scarring or neoplasm. This finding could also be re-evaluated in follow-up CT. 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 Extensive atherosclerotic changes are noted. There is a large calcific density filling much of the lumen of proximal abdominal aorta. Possibility  of high-grade stenosis is not excluded. There is no adequate contrast enhancement in the systemic circulation limiting evaluation. Severe centrilobular and panlobular emphysema. Coronary artery calcifications are seen. Other findings as described in the body of the report. Electronically Signed   By: Elmer Picker M.D.   On: 12/26/2021 16:19   MR BRAIN W WO CONTRAST  Result Date: 12/27/2021 CLINICAL DATA:  Stroke, follow-up.  Left-sided weakness. EXAM: MRI HEAD WITHOUT AND WITH CONTRAST TECHNIQUE: Multiplanar, multiecho pulse sequences of the brain and surrounding structures were obtained without and with intravenous contrast. CONTRAST:  58m GADAVIST GADOBUTROL 1 MMOL/ML IV SOLN COMPARISON:  CT head without contrast 12/26/2021 FINDINGS: Brain: Acute nonhemorrhagic infarct is present in the distal right ACA territory this involves the high medial right frontal lobe. No other acute infarct, hemorrhage, or mass lesion is present. Remote lacunar infarcts are present in the posterior lentiform nucleus bilaterally. Remote lacunar infarcts are present in the thalami bilaterally. Periventricular and subcortical T2 hyperintensities bilaterally are mildly advanced for age. The ventricles are of normal size. No significant extraaxial fluid collection is present. A left paramedian pontine infarct is remote. Cerebellum is within normal limits. Postcontrast images demonstrate no pathologic enhancement. Vascular: Flow is present in the major intracranial arteries. Skull and upper cervical spine: The craniocervical junction is normal. Upper cervical spine is within normal limits. Marrow signal is unremarkable. Sinuses/Orbits: Minimal fluid is present in the inferior mastoid air cells bilaterally. No obstructing nasopharyngeal lesion is present. Bilateral lens replacements are noted. Globes and orbits are otherwise unremarkable. IMPRESSION: 1. Acute nonhemorrhagic infarct involving the high medial right frontal lobe, distal  right ACA territory. 2. Remote lacunar infarcts of the posterior lentiform nucleus bilaterally and thalami bilaterally. 3. Periventricular and subcortical T2 hyperintensities bilaterally are mildly advanced for age. This likely reflects the sequela of chronic microvascular ischemia. Electronically Signed   By: CSan MorelleM.D.   On: 12/27/2021 14:45   DG Chest Port 1 View  Result Date: 12/27/2021 CLINICAL DATA:  Mucous plugging EXAM: PORTABLE CHEST 1 VIEW COMPARISON:  Radiograph 12/26/2021, chest CT 12/26/2021 FINDINGS: The patient is rotated towards the left. Endotracheal tube overlies the midthoracic trachea. Orogastric tube passes below the diaphragm, tip excluded by collimation. Increased left pleural effusion and left mid to lower lung airspace consolidation. Increasing opacities in the left upper lung. Stable small right pleural effusion. Emphysema with interstitial prominence. No pneumothorax. There is a chronic posttraumatic deformity of the right proximal humerus and multiple left ribs. IMPRESSION: Increased left pleural effusion and worsening adjacent left mid to lower lung consolidation, likely worsening atelectasis due to a combination of pleural effusion and mucous plugging. Stable small right pleural effusion. Electronically Signed   By: JMaurine SimmeringM.D.   On: 12/27/2021 10:39   DG Chest Port 1 View  Result Date: 12/26/2021 CLINICAL DATA:  Questionable sepsis. EXAM: PORTABLE CHEST 1 VIEW COMPARISON:  Radiograph January 14, 2018 FINDINGS: Endotracheal tube with tip projecting over the midthoracic trachea at the level of the aortic arch. Nasogastric tube courses below the diaphragm with tip and side port obscured by  collimation. Additional extracorporeal leads overlie the chest and upper abdomen. Enlarged cardiac silhouette with central vascular prominence. Opacity in the left lung base with partial obscuration of left hemidiaphragm. Chronic bronchitic lung changes. No acute osseous  abnormality. IMPRESSION: Opacity in the left lung base with partial obscuration of left hemidiaphragm. Could reflect an effusion and/or atelectasis/pneumonia. Electronically Signed   By: Dahlia Bailiff M.D.   On: 12/26/2021 12:53   ECHOCARDIOGRAM COMPLETE  Result Date: 12/27/2021    ECHOCARDIOGRAM REPORT   Patient Name:   KEYANDRA SWENSON Date of Exam: 12/27/2021 Medical Rec #:  [id number]     Height:       62.0 in Accession #:    [id number]    Weight:       87.7 lb Date of Birth:  1946-06-22      BSA:          1.347 m Patient Age:    33 years      BP:           147/91 mmHg Patient Gender: F             HR:           100 bpm. Exam Location:  Inpatient Procedure: 2D Echo, Cardiac Doppler, Color Doppler and 3D Echo Indications:     CHF-Acute Systolic D53.29  History:         Patient has prior history of Echocardiogram examinations, most                  recent 04/02/2016. CAD, Stroke and COPD; Risk                  Factors:Hypertension, Diabetes, Dyslipidemia and Current                  Smoker. GERD.  Sonographer:     Darlina Sicilian RDCS Referring Phys:  [id number] Alanson Puls TANG Diagnosing Phys: Isaias Cowman MD  Sonographer Comments: Echo performed with patient supine and on artificial respirator. IMPRESSIONS  1. Left ventricular ejection fraction, by estimation, is 60 to 65%. The left ventricle has normal function. The left ventricle has no regional wall motion abnormalities. There is moderate left ventricular hypertrophy. Left ventricular diastolic parameters were normal.  2. Right ventricular systolic function is normal. The right ventricular size is normal. There is severely elevated pulmonary artery systolic pressure.  3. The mitral valve is normal in structure. Mild to moderate mitral valve regurgitation. No evidence of mitral stenosis.  4. Tricuspid valve regurgitation is moderate to severe.  5. The aortic valve is normal in structure. Aortic valve regurgitation is mild. No aortic stenosis is present.  6.  The inferior vena cava is normal in size with greater than 50% respiratory variability, suggesting right atrial pressure of 3 mmHg. FINDINGS  Left Ventricle: Left ventricular ejection fraction, by estimation, is 60 to 65%. The left ventricle has normal function. The left ventricle has no regional wall motion abnormalities. The left ventricular internal cavity size was normal in size. There is  moderate left ventricular hypertrophy. Left ventricular diastolic parameters were normal. Right Ventricle: The right ventricular size is normal. No increase in right ventricular wall thickness. Right ventricular systolic function is normal. There is severely elevated pulmonary artery systolic pressure. The tricuspid regurgitant velocity is 4.04 m/s, and with an assumed right atrial pressure of 8 mmHg, the estimated right ventricular systolic pressure is 41.9 mmHg. Left Atrium: Left atrial size was normal in size. Right Atrium: Right atrial size  was normal in size. Pericardium: There is no evidence of pericardial effusion. Mitral Valve: The mitral valve is normal in structure. Mild to moderate mitral valve regurgitation. No evidence of mitral valve stenosis. Tricuspid Valve: The tricuspid valve is normal in structure. Tricuspid valve regurgitation is moderate to severe. No evidence of tricuspid stenosis. Aortic Valve: The aortic valve is normal in structure. Aortic valve regurgitation is mild. No aortic stenosis is present. Pulmonic Valve: The pulmonic valve was normal in structure. Pulmonic valve regurgitation is not visualized. No evidence of pulmonic stenosis. Aorta: The aortic root is normal in size and structure. Venous: The inferior vena cava is normal in size with greater than 50% respiratory variability, suggesting right atrial pressure of 3 mmHg. IAS/Shunts: No atrial level shunt detected by color flow Doppler.  LEFT VENTRICLE PLAX 2D LVIDd:         3.00 cm   Diastology LVIDs:         1.50 cm   LV e' medial:    4.12  cm/s LV PW:         1.50 cm   LV E/e' medial:  31.3 LV IVS:        1.70 cm   LV e' lateral:   6.40 cm/s LVOT diam:     1.80 cm   LV E/e' lateral: 20.2 LV SV:         40 LV SV Index:   30 LVOT Area:     2.54 cm  RIGHT VENTRICLE RV S prime:     11.90 cm/s TAPSE (M-mode): 1.8 cm LEFT ATRIUM             Index        RIGHT ATRIUM           Index LA diam:        4.10 cm 3.04 cm/m   RA Area:     10.50 cm LA Vol (A2C):   56.7 ml 42.10 ml/m  RA Volume:   19.40 ml  14.40 ml/m LA Vol (A4C):   52.8 ml 39.20 ml/m LA Biplane Vol: 55.0 ml 40.83 ml/m  AORTIC VALVE LVOT Vmax:   101.00 cm/s LVOT Vmean:  58.600 cm/s LVOT VTI:    0.159 m  AORTA Ao Root diam: 3.10 cm Ao Asc diam:  3.30 cm MITRAL VALVE                TRICUSPID VALVE MV Area (PHT): 3.26 cm     TR Peak grad:   65.3 mmHg MV Decel Time: 233 msec     TR Vmax:        404.00 cm/s MV E velocity: 129.00 cm/s MV A velocity: 124.00 cm/s  SHUNTS MV E/A ratio:  1.04         Systemic VTI:  0.16 m                             Systemic Diam: 1.80 cm Isaias Cowman MD Electronically signed by Isaias Cowman MD Signature Date/Time: 12/27/2021/3:27:38 PM    Final    _0 @ MR BRAIN W WO CONTRAST  Result Date: 12/27/2021 CLINICAL DATA:  Stroke, follow-up.  Left-sided weakness. EXAM: MRI HEAD WITHOUT AND WITH CONTRAST TECHNIQUE: Multiplanar, multiecho pulse sequences of the brain and surrounding structures were obtained without and with intravenous contrast. CONTRAST:  4m GADAVIST GADOBUTROL 1 MMOL/ML IV SOLN COMPARISON:  CT head without contrast 12/26/2021 FINDINGS: Brain: Acute nonhemorrhagic infarct is present  in the distal right ACA territory this involves the high medial right frontal lobe. No other acute infarct, hemorrhage, or mass lesion is present. Remote lacunar infarcts are present in the posterior lentiform nucleus bilaterally. Remote lacunar infarcts are present in the thalami bilaterally. Periventricular and subcortical T2 hyperintensities bilaterally are  mildly advanced for age. The ventricles are of normal size. No significant extraaxial fluid collection is present. A left paramedian pontine infarct is remote. Cerebellum is within normal limits. Postcontrast images demonstrate no pathologic enhancement. Vascular: Flow is present in the major intracranial arteries. Skull and upper cervical spine: The craniocervical junction is normal. Upper cervical spine is within normal limits. Marrow signal is unremarkable. Sinuses/Orbits: Minimal fluid is present in the inferior mastoid air cells bilaterally. No obstructing nasopharyngeal lesion is present. Bilateral lens replacements are noted. Globes and orbits are otherwise unremarkable. IMPRESSION: 1. Acute nonhemorrhagic infarct involving the high medial right frontal lobe, distal right ACA territory. 2. Remote lacunar infarcts of the posterior lentiform nucleus bilaterally and thalami bilaterally. 3. Periventricular and subcortical T2 hyperintensities bilaterally are mildly advanced for age. This likely reflects the sequela of chronic microvascular ischemia. Electronically Signed   By: San Morelle M.D.   On: 12/27/2021 14:45   DG Chest Port 1 View  Result Date: 12/27/2021 CLINICAL DATA:  Mucous plugging EXAM: PORTABLE CHEST 1 VIEW COMPARISON:  Radiograph 12/26/2021, chest CT 12/26/2021 FINDINGS: The patient is rotated towards the left. Endotracheal tube overlies the midthoracic trachea. Orogastric tube passes below the diaphragm, tip excluded by collimation. Increased left pleural effusion and left mid to lower lung airspace consolidation. Increasing opacities in the left upper lung. Stable small right pleural effusion. Emphysema with interstitial prominence. No pneumothorax. There is a chronic posttraumatic deformity of the right proximal humerus and multiple left ribs. IMPRESSION: Increased left pleural effusion and worsening adjacent left mid to lower lung consolidation, likely worsening atelectasis due to a  combination of pleural effusion and mucous plugging. Stable small right pleural effusion. Electronically Signed   By: Maurine Simmering M.D.   On: 12/27/2021 10:39   ECHOCARDIOGRAM COMPLETE  Result Date: 12/27/2021    ECHOCARDIOGRAM REPORT   Patient Name:   ANALIESE KRUPKA Date of Exam: 12/27/2021 Medical Rec #:  [id number]     Height:       62.0 in Accession #:    [id number]    Weight:       87.7 lb Date of Birth:  08-15-1945      BSA:          1.347 m Patient Age:    67 years      BP:           147/91 mmHg Patient Gender: F             HR:           100 bpm. Exam Location:  Inpatient Procedure: 2D Echo, Cardiac Doppler, Color Doppler and 3D Echo Indications:     CHF-Acute Systolic R44.31  History:         Patient has prior history of Echocardiogram examinations, most                  recent 04/02/2016. CAD, Stroke and COPD; Risk                  Factors:Hypertension, Diabetes, Dyslipidemia and Current                  Smoker. GERD.  Sonographer:  Darlina Sicilian RDCS Referring Phys:  [id number] Morrison TANG Diagnosing Phys: Isaias Cowman MD  Sonographer Comments: Echo performed with patient supine and on artificial respirator. IMPRESSIONS  1. Left ventricular ejection fraction, by estimation, is 60 to 65%. The left ventricle has normal function. The left ventricle has no regional wall motion abnormalities. There is moderate left ventricular hypertrophy. Left ventricular diastolic parameters were normal.  2. Right ventricular systolic function is normal. The right ventricular size is normal. There is severely elevated pulmonary artery systolic pressure.  3. The mitral valve is normal in structure. Mild to moderate mitral valve regurgitation. No evidence of mitral stenosis.  4. Tricuspid valve regurgitation is moderate to severe.  5. The aortic valve is normal in structure. Aortic valve regurgitation is mild. No aortic stenosis is present.  6. The inferior vena cava is normal in size with greater than 50%  respiratory variability, suggesting right atrial pressure of 3 mmHg. FINDINGS  Left Ventricle: Left ventricular ejection fraction, by estimation, is 60 to 65%. The left ventricle has normal function. The left ventricle has no regional wall motion abnormalities. The left ventricular internal cavity size was normal in size. There is  moderate left ventricular hypertrophy. Left ventricular diastolic parameters were normal. Right Ventricle: The right ventricular size is normal. No increase in right ventricular wall thickness. Right ventricular systolic function is normal. There is severely elevated pulmonary artery systolic pressure. The tricuspid regurgitant velocity is 4.04 m/s, and with an assumed right atrial pressure of 8 mmHg, the estimated right ventricular systolic pressure is 71.2 mmHg. Left Atrium: Left atrial size was normal in size. Right Atrium: Right atrial size was normal in size. Pericardium: There is no evidence of pericardial effusion. Mitral Valve: The mitral valve is normal in structure. Mild to moderate mitral valve regurgitation. No evidence of mitral valve stenosis. Tricuspid Valve: The tricuspid valve is normal in structure. Tricuspid valve regurgitation is moderate to severe. No evidence of tricuspid stenosis. Aortic Valve: The aortic valve is normal in structure. Aortic valve regurgitation is mild. No aortic stenosis is present. Pulmonic Valve: The pulmonic valve was normal in structure. Pulmonic valve regurgitation is not visualized. No evidence of pulmonic stenosis. Aorta: The aortic root is normal in size and structure. Venous: The inferior vena cava is normal in size with greater than 50% respiratory variability, suggesting right atrial pressure of 3 mmHg. IAS/Shunts: No atrial level shunt detected by color flow Doppler.  LEFT VENTRICLE PLAX 2D LVIDd:         3.00 cm   Diastology LVIDs:         1.50 cm   LV e' medial:    4.12 cm/s LV PW:         1.50 cm   LV E/e' medial:  31.3 LV IVS:         1.70 cm   LV e' lateral:   6.40 cm/s LVOT diam:     1.80 cm   LV E/e' lateral: 20.2 LV SV:         40 LV SV Index:   30 LVOT Area:     2.54 cm  RIGHT VENTRICLE RV S prime:     11.90 cm/s TAPSE (M-mode): 1.8 cm LEFT ATRIUM             Index        RIGHT ATRIUM           Index LA diam:        4.10 cm 3.04 cm/m  RA Area:     10.50 cm LA Vol (A2C):   56.7 ml 42.10 ml/m  RA Volume:   19.40 ml  14.40 ml/m LA Vol (A4C):   52.8 ml 39.20 ml/m LA Biplane Vol: 55.0 ml 40.83 ml/m  AORTIC VALVE LVOT Vmax:   101.00 cm/s LVOT Vmean:  58.600 cm/s LVOT VTI:    0.159 m  AORTA Ao Root diam: 3.10 cm Ao Asc diam:  3.30 cm MITRAL VALVE                TRICUSPID VALVE MV Area (PHT): 3.26 cm     TR Peak grad:   65.3 mmHg MV Decel Time: 233 msec     TR Vmax:        404.00 cm/s MV E velocity: 129.00 cm/s MV A velocity: 124.00 cm/s  SHUNTS MV E/A ratio:  1.04         Systemic VTI:  0.16 m                             Systemic Diam: 1.80 cm Isaias Cowman MD Electronically signed by Isaias Cowman MD Signature Date/Time: 12/27/2021/3:27:38 PM    Final         ASSESSMENT AND PLAN    -Multidisciplinary rounds held today  Acute Hypoxic Respiratory Failure -due to LLL mucus plugging         S/p bronchoscopy with theraeutic aspiration  -She did have copious amounts of blood aspirated from the stomach via OG but this has resolved  - patient extubated on 4L/min [place]     Severe protein calorie malnutrition    - plan for OG feeds today     - high risk for reefeding   Peptic ulcer disease    Protonix bid IV 40   Acute CVA    - ct head was normal   - MRI brain ordered  -Acute nonhemorrhagic infarct involving the high medial right frontal lobe, distal right ACA territory. -neurology consultation in progress - Dr Cheral Marker appreciate input  NSTEMI    - with non-rheumatic MR/TR as well as possible new onset CHF   - cardiology consultation -previously seen by Dr.  Saralyn Pilar  -Cardiac biomarkers  ICU  telemetry monitoring -starting antiplatelets   Renal Failure-most likely due to ATN -follow chem 7 -follow UO -continue Foley Catheter-assess need daily  ID -continue IV abx as prescibed -follow up cultures  GI/Nutrition GI PROPHYLAXIS as indicated DIET-->TF's as tolerated Constipation protocol as indicated  ENDO - ICU hypoglycemic\Hyperglycemia protocol -check FSBS per protocol   ELECTROLYTES -follow labs as needed -replace as needed -pharmacy consultation   DVT/GI PRX ordered -SCDs  TRANSFUSIONS AS NEEDED MONITOR FSBS ASSESS the need for LABS as needed   Critical care provider statement:    Critical care provider statement:   Total critical care time: 33 minutes   Performed by: Lanney Gins MD   Critical care time was exclusive of separately billable procedures and treating other patients.   Critical care was necessary to treat or prevent imminent or life-threatening deterioration.   Critical care was time spent personally by me on the following activities: development of treatment plan with patient and/or surrogate as well as nursing, discussions with consultants, evaluation of patient's response to treatment, examination of patient, obtaining history from patient or surrogate, ordering and performing treatments and interventions, ordering and review of laboratory studies, ordering and review of radiographic studies, pulse oximetry and re-evaluation of patient's  condition.    Ottie Glazier, M.D.  Pulmonary & Critical Care Medicine

## 2021-12-28 NOTE — Consult Note (Signed)
NEURO HOSPITALIST CONSULT NOTE   Requestig physician: Dr. Lanney Gins  Reason for Consult: Acute right ACA territory ischemic infarction  History obtained from:  Family and Chart     HPI:                                                                                                                                          [name] [name] is an 76 y.o. female with a PMHx of advanced COPD, CAD, hypercholesterolemia, HTN stroke with residual speech deficit, depression and anxiety who presented on Wednesday in an unresponsive state. Daughter first noted a problem while speaking on the telephone with her mother when she heard labored breathing. Daughter then drove to the patient's home and found her unresponsive. EMS was called and patient was brought to the ED where she was found to be hypoxemic, requiring intubation and mechanical ventilation. Blood was suctioned from OG tube. CBC revealed elevated WBC of 12.6. Troponin and BNP were elevated. CXR was suggestive of PNA. She was admitted to the ICU for further management.   An MRI brain was obtained yesterday, revealing an acute nonhemorrhagic infarct involving the high medial right frontal lobe. Neurology was consulted for stroke evaluation.   She is now awake and able to follow some commands, but unable to speak.   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  Some speech deficit.   Wears dentures    full upper and lower    Past Surgical History:  Procedure Laterality Date   ABDOMINAL HYSTERECTOMY  1982   BACK SURGERY     CATARACT EXTRACTION W/PHACO Left 09/24/2021   Procedure: CATARACT EXTRACTION PHACO AND INTRAOCULAR  LENS PLACEMENT ([place]) LEFT;  Surgeon: Eulogio Bear, MD;  Location: [facility];  Service: Ophthalmology;  Laterality: Left;  3.80 0:31.7   CATARACT EXTRACTION W/PHACO Right 10/08/2021   Procedure: CATARACT EXTRACTION PHACO AND INTRAOCULAR LENS PLACEMENT ([facility]) RIGHT 3.84 00:35.3;  Surgeon: Eulogio Bear, MD;  Location: [place];  Service: Ophthalmology;  Laterality: Right;   COLONOSCOPY WITH PROPOFOL N/A 04/07/2018   Procedure: COLONOSCOPY WITH PROPOFOL;  Surgeon: [name], MD;  Location: [facility] ENDOSCOPY;  Service: Endoscopy;  Laterality: N/A;   KYPHOPLASTY N/A 10/23/2017   Procedure: [id number];  Surgeon: Hessie Knows, MD;  Location: [facility];  Service: Orthopedics;  Laterality: N/A;    Family History  Problem Relation Age of Onset   Leukemia  Mother    Aneurysm Father    Diabetes Father    Heart disease Father    Liver cancer Sister    Cancer Sister    Lung cancer Sister        metastatic   Diabetes Maternal Grandmother    Diabetes Paternal Grandmother              Social History:  reports that she has been smoking cigarettes. She has a 26.50 pack-year smoking history. She has never used smokeless tobacco. She reports that she does not currently use alcohol after a past usage of about 2.0 standard drinks per week. She reports that she does not currently use drugs after having used the following drugs: Marijuana.  No Known Allergies  MEDICATIONS:                                                                                                                     Prior to Admission:  Medications Prior to Admission  Medication Sig Dispense Refill Last Dose   ALPRAZolam (XANAX) 0.5 MG tablet Take 1 tablet (0.5 mg total) by mouth 3 (three) times daily as needed for anxiety. 90 tablet 2 Past Week   amLODipine (NORVASC) 5 MG tablet TAKE 1 TABLET(5 MG) BY MOUTH DAILY 90 tablet 3 Past Week   aspirin 81 MG tablet Take 81 mg by mouth daily.   Past Week    Calcium 600-200 MG-UNIT tablet Take 1 tablet by mouth daily.   Past Week   cholecalciferol (VITAMIN D) 25 MCG (1000 UNIT) tablet Take 1,000 Units by mouth daily.   Past Week   citalopram (CELEXA) 40 MG tablet TAKE 1 TABLET(40 MG) BY MOUTH DAILY 30 tablet 3 Past Week   gabapentin (NEURONTIN) 600 MG tablet Take 1 tablet (600 mg total) by mouth 3 (three) times daily. 270 tablet 1 Past Week   HYDROcodone-acetaminophen (NORCO/VICODIN) 5-325 MG tablet Take 1 tablet by mouth every 6 (six) hours as needed for moderate pain. 120 tablet 0 Past Week   lisinopril (ZESTRIL) 40 MG tablet TAKE 1 TABLET(40 MG) BY MOUTH DAILY 90 tablet 0 Past Week   meloxicam (MOBIC) 7.5 MG tablet Take 1 tablet (7.5 mg total) by mouth daily as needed for pain (arthritis). 90 tablet 1 Past Week   metoprolol succinate (TOPROL-XL) 25 MG 24 hr tablet TAKE 1 TABLET(25 MG) BY MOUTH DAILY 90 tablet 3 Past Week   simvastatin (ZOCOR) 10 MG tablet TAKE 1 TABLET(10 MG) BY MOUTH DAILY 90 tablet 3 Past Week   albuterol (VENTOLIN HFA) 108 (90 Base) MCG/ACT inhaler Inhale 2 puffs into the lungs every 4 (four) hours as needed for wheezing or shortness of breath. 18 g 5 prn   docusate sodium (COLACE) 100 MG capsule Take 100 mg by mouth 2 (two) times daily as needed for mild constipation.   prn   furosemide (LASIX) 20 MG tablet Take 20 mg by mouth daily as needed for fluid.    prn   pantoprazole (PROTONIX) 20  MG tablet TAKE 1 TABLET(20 MG) BY MOUTH DAILY (Patient not taking: Reported on 12/27/2021) 90 tablet 1 Not Taking   potassium chloride SA (K-DUR,KLOR-CON) 20 MEQ tablet Take 1 tablet (20 mEq total) by mouth daily. 90 tablet 1 prn   Propylene Glycol (SYSTANE COMPLETE OP) Apply to eye.   prn   Tiotropium Bromide Monohydrate (SPIRIVA RESPIMAT) 1.25 MCG/ACT AERS Inhale 1 puff into the lungs daily. (Patient not taking: Reported on 12/27/2021) 4 g 5 Not Taking   Scheduled:  aspirin  300 mg Rectal Once   Chlorhexidine Gluconate Cloth  6 each Topical  Q0600   docusate  100 mg Per Tube BID   folic acid  1 mg Intravenous Daily   ipratropium-albuterol  3 mL Nebulization Once   lidocaine  1 patch Transdermal Q24H   multivitamin with minerals  1 tablet Per Tube Daily   pantoprazole (PROTONIX) IV  40 mg Intravenous Q12H   polyethylene glycol  17 g Per Tube Daily   thiamine injection  100 mg Intravenous Daily   Continuous:  azithromycin Stopped (12/27/21 1734)   cefTRIAXone (ROCEPHIN)  IV Stopped (12/27/21 1618)     ROS:                                                                                                                                       Unable to obtain due to aphasia.    Blood pressure (!) 159/62, pulse (!) 115, temperature 98.1 F (36.7 C), temperature source Axillary, resp. rate 18, height 5' 2.01" (1.575 m), weight 40 kg, SpO2 (!) 85 %.   General Examination:                                                                                                       Physical Exam  HEENT-  [place]/AT   Lungs- Respirations unlabored Extremities- Warm and well perfused   Neurological Examination Mental Status: Awake. Global aphasia with complete mutism but ability to follow some simple one-step commands. Will make eye contact with examiner. No agitation noted. Cranial Nerves: II: Blinks to threat in right and left visual fields. PERRL  III,IV, VI: Horizontal EOMI. No nystagmus.  V: Reacts to touch bilaterally VII: Lying on left side. Deferred turning patient on back to accurately assess for facial droop due to back pain reported by family.  VIII: Responds to some one-step commands IX,X: Deferred XI: Deferred XII: Will protrude tongue to command, but it deviates to the right.  Motor: Does not  follow commands for testing of limb strength. Will spontaneously move RUE purposefully antigravity (scratches side of mouth, gestures weakly) RLE: No movement to command or plantar stimulation LUE 0/5 with flaccid tone LLE with  minimal movement of foot and toes to plantar stimulation Sensory: Will react to LUE pinch. Does not react to RUE pinch. Moves right foot to plantar stimulation. Does not react to left plantar stimulation.  Deep Tendon Reflexes: 1+ bilateral brachioradialis, biceps and patellae.  Plantars: Right: Downgoing   Left: Upgoing Cerebellar/Gait: Unable to assess    Lab Results: Basic Metabolic Panel: Recent Labs  Lab 12/26/21 1202 12/27/21 0414 12/27/21 1250 12/28/21 0416  NA 137 139  --  142  K 4.6 5.4* 4.5 4.2  CL 103 109  --  109  CO2 23 23  --  23  GLUCOSE 112* 136*  --  124*  BUN 27* 37*  --  58*  CREATININE 1.56* 2.01*  --  2.35*  CALCIUM 8.8* 8.3*  --  8.6*  MG  --  2.0  --  2.2  PHOS  --  5.8*  --   --     CBC: Recent Labs  Lab 12/26/21 1307 12/27/21 0414 12/28/21 0416  WBC 12.6* 12.1* 12.7*  NEUTROABS 11.3*  --   --   HGB 10.8* 11.6* 11.4*  HCT 36.2 37.4 36.0  MCV 102.3* 97.9 95.0  PLT 189 171 183    Cardiac Enzymes: Recent Labs  Lab 12/26/21 1307  CKTOTAL 127    Lipid Panel: No results for input(s): CHOL, TRIG, HDL, CHOLHDL, VLDL, LDLCALC in the last 168 hours.  Imaging: DG Abdomen 1 View  Result Date: 12/26/2021 CLINICAL DATA:  Orogastric tube placement EXAM: ABDOMEN - 1 VIEW COMPARISON:  Chest radiograph 12/26/2021 and MRI abdomen 08/31/2020 FINDINGS: Pulmonary nodule versus nipple shadows in the lungs. Emphysema. Extensive atherosclerosis. Aortic Atherosclerosis (ICD10-I70.0). Possible left pleural thickening at the lung base, cannot exclude pleural effusion. L1 vertebral augmentation. Bony demineralization. The orogastric tube tip is in the stomach body with side port in the stomach body. IMPRESSION: 1. Orogastric tube tip and side port in the stomach body. 2. Possible left pleural effusion. 3. Emphysema and interstitial accentuation in the lungs. 4. Aortic Atherosclerosis (ICD10-I70.0) and Emphysema (ICD10-J43.9). 5. Nodularity in the mid lungs, nipple  shadows versus true pulmonary nodules. Electronically Signed   By: Van Clines M.D.   On: 12/26/2021 12:52   CT Head Wo Contrast  Result Date: 12/26/2021 CLINICAL DATA:  Mental status change, persistent or worsening, intubated, ams EXAM: CT HEAD WITHOUT CONTRAST TECHNIQUE: Contiguous axial images were obtained from the base of the skull through the vertex without intravenous contrast. RADIATION DOSE REDUCTION: This exam was performed according to the departmental dose-optimization program which includes automated exposure control, adjustment of the mA and/or kV according to patient size and/or use of iterative reconstruction technique. COMPARISON:  2017 FINDINGS: Brain: There is no acute intracranial hemorrhage, mass effect, or edema. Gray-white differentiation is preserved. There is no extra-axial fluid collection. Prominence of the ventricles and sulci reflects minor parenchymal volume loss. Minimal patchy hypoattenuation in the supratentorial white matter is nonspecific but may reflect minor chronic microvascular ischemic changes. Chronic small vessel infarct the posterolateral right putamen. Vascular: There is atherosclerotic calcification at the skull base. Skull: Calvarium is unremarkable. Sinuses/Orbits: No acute finding. Other: None. IMPRESSION: No acute intracranial abnormality. Electronically Signed   By: Macy Mis M.D.   On: 12/26/2021 15:59   CT Angio Chest PE W and/or  Wo Contrast  Result Date: 12/26/2021 CLINICAL DATA:  High clinical suspicion for pulmonary embolism EXAM: CT ANGIOGRAPHY CHEST WITH CONTRAST TECHNIQUE: Multidetector CT imaging of the chest was performed using the standard protocol during bolus administration of intravenous contrast. Multiplanar CT image reconstructions and MIPs were obtained to evaluate the vascular anatomy. RADIATION DOSE REDUCTION: This exam was performed according to the departmental dose-optimization program which includes automated exposure  control, adjustment of the mA and/or kV according to patient size and/or use of iterative reconstruction technique. CONTRAST:  67m OMNIPAQUE IOHEXOL 350 MG/ML SOLN COMPARISON:  Chest radiograph done earlier today and CT chest done on 05/23/2020 FINDINGS: Cardiovascular: Coronary artery calcifications are seen. Dense calcification is seen in the mitral annulus. Contrast density in the thoracic aorta is less than adequate to evaluate the lumen. There is ectasia of ascending thoracic aorta measuring 3.5 cm. There is focal aneurysmal dilation in the proximal descending thoracic aorta measuring 4.2 cm in AP diameter which measured 4 cm in the previous study. Aortic arch measures 3 cm. Distal thoracic aorta measures 2.9 cm. There is dense calcification filling much of the lumen of proximal abdominal aorta. Mediastinum/Nodes: There are calcified nodules in the mediastinum. Enteric tube is noted traversing the esophagus. Tip of endotracheal tube is at the level of aortic arch. Lungs/Pleura: Severe centrilobular and panlobular emphysema is seen. There is decreased volume in the left lung. There is dense atelectasis in the left lower lobe. There are faint ground-glass densities in the left upper lobe. Small patchy infiltrates are seen in the right lower lobe. In image 20 of series 7 there is 12 mm pleural-based nodular density in the posterior left upper lung fields which was not seen in the previous examination. Small to moderate bilateral pleural effusions are seen. There is no pneumothorax. Upper Abdomen: Distal portion of enteric tube is seen in the stomach. Musculoskeletal: There is decrease in height of few of the thoracic vertebral bodies and L1 vertebral body. There is previous vertebroplasty in the L1 vertebra. These findings have not changed significantly. Review of the MIP images confirms the above findings. IMPRESSION: There is no evidence of pulmonary artery embolism. There is decreased volume in the left lung  with dense atelectasis in the left lower lobe. This may be caused by mucous plugging in the airways or central obstructing neoplastic process. Follow-up CT and endoscopy as warranted should be considered. Small patchy infiltrates are seen in the right lower lung fields suggesting subsegmental atelectasis. Small to moderate bilateral pleural effusions are seen. There is a new 12 mm pleural-based noncalcified density in the posterior left upper lung fields. This may suggest focal infectious process or scarring or neoplasm. This finding could also be re-evaluated in follow-up CT. 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 Extensive atherosclerotic changes are noted. There is a large calcific density filling much of the lumen of proximal abdominal aorta. Possibility of high-grade stenosis is not excluded. There is no adequate contrast enhancement in the systemic circulation limiting evaluation. Severe centrilobular and panlobular emphysema. Coronary artery calcifications are seen. Other findings as described in the body of the report. Electronically Signed   By: PElmer PickerM.D.   On: 12/26/2021 16:19   MR BRAIN W WO CONTRAST  Result Date: 12/27/2021 CLINICAL DATA:  Stroke, follow-up.  Left-sided weakness. EXAM: MRI HEAD WITHOUT AND WITH CONTRAST TECHNIQUE: Multiplanar, multiecho pulse sequences of the brain and surrounding structures were obtained without and with intravenous contrast. CONTRAST:  524mGADAVIST GADOBUTROL  1 MMOL/ML IV SOLN COMPARISON:  CT head without contrast 12/26/2021 FINDINGS: Brain: Acute nonhemorrhagic infarct is present in the distal right ACA territory this involves the high medial right frontal lobe. No other acute infarct, hemorrhage, or mass lesion is present. Remote lacunar infarcts are present in the posterior lentiform nucleus bilaterally. Remote lacunar infarcts are present in the thalami bilaterally. Periventricular and subcortical T2 hyperintensities  bilaterally are mildly advanced for age. The ventricles are of normal size. No significant extraaxial fluid collection is present. A left paramedian pontine infarct is remote. Cerebellum is within normal limits. Postcontrast images demonstrate no pathologic enhancement. Vascular: Flow is present in the major intracranial arteries. Skull and upper cervical spine: The craniocervical junction is normal. Upper cervical spine is within normal limits. Marrow signal is unremarkable. Sinuses/Orbits: Minimal fluid is present in the inferior mastoid air cells bilaterally. No obstructing nasopharyngeal lesion is present. Bilateral lens replacements are noted. Globes and orbits are otherwise unremarkable. IMPRESSION: 1. Acute nonhemorrhagic infarct involving the high medial right frontal lobe, distal right ACA territory. 2. Remote lacunar infarcts of the posterior lentiform nucleus bilaterally and thalami bilaterally. 3. Periventricular and subcortical T2 hyperintensities bilaterally are mildly advanced for age. This likely reflects the sequela of chronic microvascular ischemia. Electronically Signed   By: San Morelle M.D.   On: 12/27/2021 14:45   DG Chest Port 1 View  Result Date: 12/27/2021 CLINICAL DATA:  Mucous plugging EXAM: PORTABLE CHEST 1 VIEW COMPARISON:  Radiograph 12/26/2021, chest CT 12/26/2021 FINDINGS: The patient is rotated towards the left. Endotracheal tube overlies the midthoracic trachea. Orogastric tube passes below the diaphragm, tip excluded by collimation. Increased left pleural effusion and left mid to lower lung airspace consolidation. Increasing opacities in the left upper lung. Stable small right pleural effusion. Emphysema with interstitial prominence. No pneumothorax. There is a chronic posttraumatic deformity of the right proximal humerus and multiple left ribs. IMPRESSION: Increased left pleural effusion and worsening adjacent left mid to lower lung consolidation, likely worsening  atelectasis due to a combination of pleural effusion and mucous plugging. Stable small right pleural effusion. Electronically Signed   By: Maurine Simmering M.D.   On: 12/27/2021 10:39   DG Chest Port 1 View  Result Date: 12/26/2021 CLINICAL DATA:  Questionable sepsis. EXAM: PORTABLE CHEST 1 VIEW COMPARISON:  Radiograph January 14, 2018 FINDINGS: Endotracheal tube with tip projecting over the midthoracic trachea at the level of the aortic arch. Nasogastric tube courses below the diaphragm with tip and side port obscured by collimation. Additional extracorporeal leads overlie the chest and upper abdomen. Enlarged cardiac silhouette with central vascular prominence. Opacity in the left lung base with partial obscuration of left hemidiaphragm. Chronic bronchitic lung changes. No acute osseous abnormality. IMPRESSION: Opacity in the left lung base with partial obscuration of left hemidiaphragm. Could reflect an effusion and/or atelectasis/pneumonia. Electronically Signed   By: Dahlia Bailiff M.D.   On: 12/26/2021 12:53   ECHOCARDIOGRAM COMPLETE  Result Date: 12/27/2021    ECHOCARDIOGRAM REPORT   Patient Name:   [name] [name] Date of Exam: 12/27/2021 Medical Rec #:  [id number]     Height:       62.0 in Accession #:    [id number]    Weight:       87.7 lb Date of Birth:  19-Feb-1946      BSA:          1.347 m Patient Age:    76 years      BP:  147/91 mmHg Patient Gender: F             HR:           100 bpm. Exam Location:  Inpatient Procedure: 2D Echo, Cardiac Doppler, Color Doppler and 3D Echo Indications:     CHF-Acute Systolic I09.73  History:         Patient has prior history of Echocardiogram examinations, most                  recent 04/02/2016. CAD, Stroke and COPD; Risk                  Factors:Hypertension, Diabetes, Dyslipidemia and Current                  Smoker. GERD.  Sonographer:     Darlina Sicilian RDCS Referring Phys:  [id number] Alanson Puls TANG Diagnosing Phys: Isaias Cowman MD  Sonographer Comments:  Echo performed with patient supine and on artificial respirator. IMPRESSIONS  1. Left ventricular ejection fraction, by estimation, is 60 to 65%. The left ventricle has normal function. The left ventricle has no regional wall motion abnormalities. There is moderate left ventricular hypertrophy. Left ventricular diastolic parameters were normal.  2. Right ventricular systolic function is normal. The right ventricular size is normal. There is severely elevated pulmonary artery systolic pressure.  3. The mitral valve is normal in structure. Mild to moderate mitral valve regurgitation. No evidence of mitral stenosis.  4. Tricuspid valve regurgitation is moderate to severe.  5. The aortic valve is normal in structure. Aortic valve regurgitation is mild. No aortic stenosis is present.  6. The inferior vena cava is normal in size with greater than 50% respiratory variability, suggesting right atrial pressure of 3 mmHg. FINDINGS  Left Ventricle: Left ventricular ejection fraction, by estimation, is 60 to 65%. The left ventricle has normal function. The left ventricle has no regional wall motion abnormalities. The left ventricular internal cavity size was normal in size. There is  moderate left ventricular hypertrophy. Left ventricular diastolic parameters were normal. Right Ventricle: The right ventricular size is normal. No increase in right ventricular wall thickness. Right ventricular systolic function is normal. There is severely elevated pulmonary artery systolic pressure. The tricuspid regurgitant velocity is 4.04 m/s, and with an assumed right atrial pressure of 8 mmHg, the estimated right ventricular systolic pressure is 26.8 mmHg. Left Atrium: Left atrial size was normal in size. Right Atrium: Right atrial size was normal in size. Pericardium: There is no evidence of pericardial effusion. Mitral Valve: The mitral valve is normal in structure. Mild to moderate mitral valve regurgitation. No evidence of mitral valve  stenosis. Tricuspid Valve: The tricuspid valve is normal in structure. Tricuspid valve regurgitation is moderate to severe. No evidence of tricuspid stenosis. Aortic Valve: The aortic valve is normal in structure. Aortic valve regurgitation is mild. No aortic stenosis is present. Pulmonic Valve: The pulmonic valve was normal in structure. Pulmonic valve regurgitation is not visualized. No evidence of pulmonic stenosis. Aorta: The aortic root is normal in size and structure. Venous: The inferior vena cava is normal in size with greater than 50% respiratory variability, suggesting right atrial pressure of 3 mmHg. IAS/Shunts: No atrial level shunt detected by color flow Doppler.  LEFT VENTRICLE PLAX 2D LVIDd:         3.00 cm   Diastology LVIDs:         1.50 cm   LV e' medial:    4.12 cm/s LV  PW:         1.50 cm   LV E/e' medial:  31.3 LV IVS:        1.70 cm   LV e' lateral:   6.40 cm/s LVOT diam:     1.80 cm   LV E/e' lateral: 20.2 LV SV:         40 LV SV Index:   30 LVOT Area:     2.54 cm  RIGHT VENTRICLE RV S prime:     11.90 cm/s TAPSE (M-mode): 1.8 cm LEFT ATRIUM             Index        RIGHT ATRIUM           Index LA diam:        4.10 cm 3.04 cm/m   RA Area:     10.50 cm LA Vol (A2C):   56.7 ml 42.10 ml/m  RA Volume:   19.40 ml  14.40 ml/m LA Vol (A4C):   52.8 ml 39.20 ml/m LA Biplane Vol: 55.0 ml 40.83 ml/m  AORTIC VALVE LVOT Vmax:   101.00 cm/s LVOT Vmean:  58.600 cm/s LVOT VTI:    0.159 m  AORTA Ao Root diam: 3.10 cm Ao Asc diam:  3.30 cm MITRAL VALVE                TRICUSPID VALVE MV Area (PHT): 3.26 cm     TR Peak grad:   65.3 mmHg MV Decel Time: 233 msec     TR Vmax:        404.00 cm/s MV E velocity: 129.00 cm/s MV A velocity: 124.00 cm/s  SHUNTS MV E/A ratio:  1.04         Systemic VTI:  0.16 m                             Systemic Diam: 1.80 cm Isaias Cowman MD Electronically signed by Isaias Cowman MD Signature Date/Time: 12/27/2021/3:27:38 PM    Final      Assessment: 76 year old  female presenting with acute MI and acute right ACA territory ischemic infarction.  1. MRI brain: Acute nonhemorrhagic infarct involving the high medial right frontal lobe, distal right ACA territory. Remote lacunar infarcts of the posterior lentiform nucleus bilaterally and thalami bilaterally. Periventricular and subcortical T2-hyperintense chronic small vessel ischemic changes.  2. TTE: LVEF of 60 to 65%. No mural thrombus or valvular vegetation mentioned in the report.  3. Exam reveals LUE > LLE weakness and mutism suggestive of an acute stroke recurrence in the time interval following the MRI obtained yesterday. This was discussed with the family, who currently are considering palliative care and would like to hold of on repeat MRI for now.   Recommendations: 1. Family is indicating that they may move in the direction of palliative care given patient's worsening condition.  2. Continue ASA.  3. Please call Neurology if family decides on maximal medical management, which would include completion of her stroke work up.   40 minutes spent in the neurological evaluation and management of this critically ill patient.   Electronically signed: Dr. Kerney Elbe 12/28/2021, 8:20 AM

## 2021-12-28 NOTE — Consult Note (Cosign Needed)
Consultation Note Date: 12/28/2021   Patient Name: [name] [name]  DOB: [birth year]  MRN: [id number]  Age / Sex: 76 y.o., female  PCP: Olin Hauser, DO Referring Physician: Ottie Glazier, MD  Reason for Consultation: Establishing goals of care  HPI/Patient Profile: 76 y.o. female  with past medical history of COPD with ongoing tobacco use, gastric ulcers, GERD, hypertension, hyperlipidemia, palpitations, and prior TIA  admitted on 12/26/2021 with AMS. Chest x-ray revealed possible left lung pneumonia.  Chest CT did not reveal evidence for pulmonary embolus.  CT scan did reveal left lung dense atelectasis left lower lobe which may have been caused by mucous plugging, a new 12 mm pleural-based noncalcified density posterior left upper lung suggesting focal infectious process.  Head CT did not reveal any acute intracranial abnormality.  MRI revealed acute nonhemorrhagic infarct involving the high medial right frontal lobe.  Neurology concerned about acute stroke recurrence since MRI.  Patient also with NSTEMI. Patient was initially intubated and then extubated yesterday -remains on nasal cannula.  Patient was evaluated by SLP and deemed unsafe for any p.o. intake.  Patient family not interested in cortrak or PEG tube.  Patient also with worsening creatinine.  PMT consulted to discuss goals of care.  Clinical Assessment and Goals of Care: I have reviewed medical records including EPIC notes, labs and imaging, received report from RN and SLP, assessed the patient and then met with patient's 2 daughters Wells Guiles and Colletta Maryland to discuss diagnosis prognosis, GOC, EOL wishes, disposition and options.  I introduced Palliative Medicine as specialized medical care for people living with serious illness. It focuses on providing relief from the symptoms and stress of a serious illness. The goal is to improve quality of life for both the patient and the  family.  Family shares patient was mostly independent at baseline.  Still able to drive short distances.  They do share of poor appetite and losing significant amount of weight.  They share some cognitive decline.  They share of overall decline recently.   We discussed patient's current illness and what it means in the larger context of patient's on-going co-morbidities.  Natural disease trajectory and expectations at EOL were discussed.  We discussed her SLP evaluation and inability to tolerate a safe diet.  We discussed her strokes.  We discussed her respiratory failure and NSTEMI.  We discussed her kidney injury.  I attempted to elicit values and goals of care important to the patient.  Family shares quality of life is important to patient and she would not be interested in aggressive medical care.  The difference between aggressive medical intervention and comfort care was considered in light of the patient's goals of care.  Family is interested in transitioning to focus on her comfort.  Hospice services outpatient were explained and offered.  Discussed philosophy of hospice care and type of care provided.  Family is interested in transitioning to hospice facility.  We discussed while we await hospice bed we will start comfort measures in the hospital.  Questions and concerns were addressed. The family was encouraged to call with questions or concerns.  After conversation with family went to bedside to discuss plan with patient.  We reviewed all of the above including her clinical course and diagnoses.  We reviewed plan to transition to focus on her comfort and move her to a hospice facility when a bed is available.  She is agreeable to this plan and tells me she wants to be sure she is made comfortable.  She tells me she is having generalized pain.  She also complains of anxiety.  We discussed medications that can be used to provide relief.  Primary Decision Maker NEXT OF KIN -2 daughters     SUMMARY OF RECOMMENDATIONS   Transition to full comfort measures As needed medications made available to promote comfort Requested hospice facility bed  Code Status/Advance Care Planning: DNR   Symptom Management:  As needed Dilaudid, avoid morphine with renal impairment As needed Ativan As needed Robinul As needed Zofran As needed Haldol  Additional Recommendations (Limitations, Scope, Preferences): Full Comfort Care  Prognosis:  < 2 weeks  Discharge Planning: Hospice facility      Primary Diagnoses: Present on Admission:  Acute hypoxemic respiratory failure ([place])   I have reviewed the medical record, interviewed the patient and family, and examined the patient. The following aspects are pertinent.  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  Some speech deficit.   Wears dentures    full upper and lower   Social History   Socioeconomic History   Marital status: Widowed    Spouse name: Not on file   Number of children: 1   Years of education: Not on file   Highest education level: Some college, no degree  Occupational History    Employer: RETIRED  Tobacco Use   Smoking status: Every Day    Packs/day: 0.50    Years: 53.00    Pack years: 26.50    Types: Cigarettes   Smokeless tobacco: Never   Tobacco comments:    Since age 24  Vaping Use   Vaping Use: Never used  Substance and Sexual Activity   Alcohol use: Not Currently    Alcohol/week: 2.0 standard drinks    Types: 2 Glasses of wine per week   Drug use: Not Currently    Types: Marijuana    Comment: in the past   Sexual activity: Not on file  Other Topics Concern   Not on file  Social  History Narrative   Not on file   Social Determinants of Health   Financial Resource Strain: Low Risk    Difficulty of Paying Living Expenses: Not hard at all  Food Insecurity: No Food Insecurity   Worried About Charity fundraiser in the Last Year: Never true   Ran Out of Food in the Last Year: Never true  Transportation Needs: No Transportation Needs   Lack of Transportation (Medical): No   Lack of Transportation (Non-Medical): No  Physical Activity: Inactive   Days of Exercise per Week: 0 days   Minutes of Exercise per Session: 0 min  Stress: Stress Concern Present   Feeling of Stress : Rather much  Social Connections: Socially Isolated   Frequency of Communication with Friends and Family: More than three times a week   Frequency of Social Gatherings with Friends and Family: Never   Attends Religious Services: Never   Marine scientist or Organizations: No   Attends Archivist Meetings: Never   Marital Status: Widowed   Family History  Problem Relation Age of Onset   Leukemia Mother  Aneurysm Father    Diabetes Father    Heart disease Father    Liver cancer Sister    Cancer Sister    Lung cancer Sister        metastatic   Diabetes Maternal Grandmother    Diabetes Paternal Grandmother    Scheduled Meds:  antiseptic oral rinse  15 mL Topical TID   lidocaine  1 patch Transdermal Q24H   polyethylene glycol  17 g Per Tube Daily   Continuous Infusions: PRN Meds:.albuterol, glycopyrrolate **OR** glycopyrrolate **OR** glycopyrrolate, haloperidol **OR** haloperidol **OR** haloperidol lactate, HYDROmorphone (DILAUDID) injection, LORazepam **OR** LORazepam **OR** LORazepam, polyethylene glycol, polyvinyl alcohol No Known Allergies Review of Systems  Constitutional:  Positive for activity change and appetite change.  Respiratory:  Positive for shortness of breath.   Gastrointestinal:  Positive for nausea.   Physical Exam Constitutional:      General: She  is not in acute distress.    Appearance: She is ill-appearing.  Cardiovascular:     Rate and Rhythm: Tachycardia present.  Pulmonary:     Effort: Pulmonary effort is normal.  Skin:    General: Skin is warm and dry.  Neurological:     Mental Status: She is alert.     Comments: Cognitive impairment    Vital Signs: BP (!) 184/79   Pulse (!) 120   Temp 98.1 F (36.7 C) (Axillary)   Resp 18   Ht 5' 2.01" (1.575 m)   Wt 40 kg   SpO2 (!) 89%   BMI 16.12 kg/m  Pain Scale: PAINAD       SpO2: SpO2: (!) 89 % O2 Device:SpO2: (!) 89 % O2 Flow Rate: .O2 Flow Rate (L/min): 5 L/min  IO: Intake/output summary:  Intake/Output Summary (Last 24 hours) at 12/28/2021 1450 Last data filed at 12/28/2021 1200 Gross per 24 hour  Intake 350.04 ml  Output 1350 ml  Net -999.96 ml    LBM: Last BM Date :  (PTA) Baseline Weight: Weight: 41.3 kg Most recent weight: Weight: 40 kg     Palliative Assessment/Data: PPS 20%     *Please note that this is a verbal dictation therefore any spelling or grammatical errors are due to the "[place] [facility]" system interpretation.   Juel Burrow, DNP, AGNP-C Palliative Medicine Team [phone] Pager: [phone]

## 2021-12-28 NOTE — Consult Note (Signed)
[facility] for Electrolyte Monitoring and Replacement   Recent Labs: Potassium (mmol/L)  Date Value  12/28/2021 4.2  11/04/2013 4.0   Magnesium (mg/dL)  Date Value  12/28/2021 2.2   Calcium (mg/dL)  Date Value  12/28/2021 8.6 (L)   Calcium, Total (mg/dL)  Date Value  11/04/2013 8.8   Albumin (g/dL)  Date Value  12/26/2021 3.7  12/12/2020 4.6   Phosphorus (mg/dL)  Date Value  12/27/2021 5.8 (H)   Sodium (mmol/L)  Date Value  12/28/2021 142  12/12/2020 136  11/04/2013 131 (L)   Assessment: Patient is a 76 y/o F with medical history including COPD, HLD, GERD, EtOH use disorder, tobacco use disorder, osteoporosis who was BIBEMS 5/31 after being found at home unresponsive. Patient intubated in the ED for hypercarbic respiratory failure. Disposition is the ICU. Pharmacy consulted to assist with electrolyte monitoring and replacement as indicated.  Goal of Therapy:  Electrolytes within normal limits  Plan:  --no electrolyte replacement warranted for today --Follow-up electrolytes with AM labs tomorrow  Dallie Piles 12/28/2021 6:57 AM

## 2021-12-28 NOTE — TOC Initial Note (Signed)
Transition of Care [facility]) - Initial/Assessment Note    Patient Details  Name: [name] [name] MRN: [id number] Date of Birth: 06-20-1946  Transition of Care [facility]) CM/SW Contact:    Shelbie Hutching, RN Phone Number: 12/28/2021, 8:47 PM  Clinical Narrative:                 Patient admitted to the hospital with acute respiratory failure currently on acute [place] at 5L.   RNCM met with patient and with patient's family at the bedside, her 2 daughters, Jacqlyn Larsen and Colletta [place].  Patient is from home where she has been living alone and independent, daughter's have noticed a decline in the past year especially with weight loss.  Patient keeps saying that she wants to go home.    Palliative Care came to speak with daughters, they are aware that the patient did not pass her swallow eval and the do not want any kind of feeding tube. They have decided on hospice home with [facility].  Lorayne Bender given referral and patient has been accepted to the hospice home- no beds today.    Expected Discharge Plan: [place] Barriers to Discharge: Continued Medical Work up   Patient Goals and CMS Choice Patient states their goals for this hospitalization and ongoing recovery are:: family has decided that they would like for the patient to go to hospice home CMS Medicare.gov Compare Post Acute Care list provided to:: Patient Represenative (must comment) Choice offered to / list presented to : Adult Children  Expected Discharge Plan and Services Expected Discharge Plan: [place]   Discharge Planning Services: CM Consult Post Acute Care Choice: Hospice Living arrangements for the past 2 months: Single Family Home                 DME Arranged: N/A DME Agency: NA       HH Arranged: NA HH Agency: NA        Prior Living Arrangements/Services Living arrangements for the past 2 months: Single Family Home Lives with:: Self Patient language and need for interpreter reviewed:: Yes         Need for Family Participation in Patient Care: Yes (Comment) Care giver support system in place?: Yes (comment) Current home services: DME Criminal Activity/Legal Involvement Pertinent to Current Situation/Hospitalization: No - Comment as needed  Activities of Daily Living Home Assistive Devices/Equipment: None ADL Screening (condition at time of admission) Patient's cognitive ability adequate to safely complete daily activities?: Yes Is the patient deaf or have difficulty hearing?: No Does the patient have difficulty seeing, even when wearing glasses/contacts?: No Does the patient have difficulty concentrating, remembering, or making decisions?: No Patient able to express need for assistance with ADLs?: Yes Does the patient have difficulty dressing or bathing?: No Independently performs ADLs?: Yes (appropriate for developmental age) Does the patient have difficulty walking or climbing stairs?: No Weakness of Legs: None Weakness of Arms/Hands: None  Permission Sought/Granted Permission sought to share information with : Case Manager, Family Supports, Chartered certified accountant granted to share information with : Yes, Verbal Permission Granted  Share Information with NAME: Zenda Alpers  Permission granted to share info w AGENCY: [place] granted to share info w Relationship: daughter  Permission granted to share info w Contact Information: [phone]  Emotional Assessment Appearance:: Appears older than stated age     Orientation: : Oriented to Self Alcohol / Substance Use: Not Applicable Psych Involvement: No (comment)  Admission diagnosis:  Wheezing [R06.2] Acute  hypoxemic respiratory failure ([place]) [J96.01] Patient Active Problem List   Diagnosis Date Noted   Protein-calorie malnutrition, severe 12/27/2021   Pressure injury of skin 12/27/2021   Acute hypoxemic respiratory failure ([place]) 12/26/2021   Chronic bilateral low back pain  without sciatica 08/17/2021   Psychophysiological insomnia 08/17/2021   Spondylolisthesis at L4-L5 level 06/01/2021   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[place]) 06/01/2021   Encounter for smoking cessation counseling 06/01/2021   Underweight due to inadequate caloric intake 06/01/2021   Adult situational stress disorder 06/01/2021   Encounter for breast cancer screening using non-mammogram modality 02/22/2020   Major depressive disorder, recurrent, 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   Personal history of colonic polyps    Benign neoplasm of cecum    Benign neoplasm of transverse colon    Benign neoplasm of ascending colon    Compression fracture of body of thoracic vertebra ([place]) 10/28/2017   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/28/2017   Renal cyst 10/28/2017   Hypokalemia 10/18/2017   Chronic congestive heart failure ([place]) 09/26/2017   Alcohol abuse 08/28/2016   Pedal edema 03/13/2016   Osteoporosis 09/07/2015   Pleural effusion 02/03/2015   Rib fracture 02/02/2015   Gastric ulcer 12/29/2014   H/O transient cerebral ischemia 12/29/2014   HLD (hyperlipidemia) 12/29/2014   BP (high blood pressure) 12/29/2014   Below normal amount of sodium in the blood 12/29/2014   Awareness of heartbeats 12/29/2014   Abnormal Pap smear of vagina 12/29/2014   Plantar fasciitis 12/29/2014   Esophageal reflux 12/29/2014   MI (mitral incompetence) 12/31/2013   TI (tricuspid incompetence) 12/31/2013   Chest pain 12/31/2013   Chronic obstructive pulmonary disease ([place]) 12/31/2013   Abnormal blood sugar 03/01/2009   GAD (generalized anxiety disorder) 02/15/2009   Current tobacco use 06/26/2006   PCP:  Olin Hauser, DO Pharmacy:   [facility] [phone] [address], [place] AT [facility] OF [address] [place] [address] Phone: [phone] Fax: [phone]     Social Determinants of Health (SDOH) Interventions     Readmission Risk Interventions     View : No data to display.

## 2021-12-28 NOTE — Progress Notes (Signed)
Manufacturing engineer [facility]) Hospital Liaison Note  Received request from PMT/Shae S. for family interest in [facility]. Visited patient at bedside and spoke with daughter/Rebecca to confirm interest and explain services.  Approval for Hospice Home is determined by [facility] MD. Once [facility] MD has determined Hospice Home eligibility, [place] will update hospital staff and family. Patient has been approved.  Unfortunately, Hospice Home is not able to offer a room today. Family and Jeanna/TOC Manager aware hospital liaison will follow up tomorrow or sooner if a room becomes available.    Please do not hesitate to call with any hospice related questions.    Thank you for the opportunity to participate in this patient's care.   Daphene Calamity, MSW [facility] Liaison [phone]

## 2021-12-28 NOTE — Progress Notes (Signed)
[facility]  CARDIOLOGY CONSULT NOTE  Patient ID: [name] [name] MRN: [id number] DOB/AGE: 03-23-1946 76 y.o.  Admit date: 12/26/2021 Referring Physician [facility] Primary Physician  Primary Cardiologist Paraschos (last seen 2018)  Reason for Consultation elevated troponin  HPI: 76 year old female with a past medical history of COPD with ongoing tobacco use, gastric ulcers, GERD, hypertension, hyperlipidemia, palpitations, prior TIA who arrived at [facility] ED 12/26/2021 in an unresponsive state.    According to the daughter, she was speaking with the patient on the phone, when the patient became short of breath.  Went to check on her, at which time the patient was unresponsive, EMS was called, and was brought to [facility] ED she was noted to be hypoxic, intubated and placed on mechanical ventilator.  Chest x-ray revealed possible left lung pneumonia.  Chest CT did not reveal evidence for pulmonary embolus.  CT scan did reveal left lung dense atelectasis left lower lobe which may have been caused by mucous plugging, a new 12 mm pleural-based noncalcified density posterior left upper lung suggesting focal infectious process.  Head CT did not reveal any acute intracranial abnormality.  Admission labs revealed BUN and creatinine of 27 and 1.56, with GFR 34.  BNP was 2045, high-sensitivity troponin was elevated (320, 1283).  ECG revealed sinus rhythm with associated ST-T wave abnormalities which appeared unchanged compared to prior ECG from 01/14/2018.  Of note, OG tube was placed, and suctioning of blood was noted.  Hemoglobin and hematocrit were 10.8 and 36.2, respectively.  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  Some speech deficit.   Wears dentures    full upper and lower    Past Surgical History:  Procedure Laterality Date   ABDOMINAL HYSTERECTOMY  1982   BACK SURGERY     CATARACT EXTRACTION W/PHACO Left 09/24/2021   Procedure: CATARACT EXTRACTION PHACO AND INTRAOCULAR LENS PLACEMENT ([place]) LEFT;  Surgeon: Eulogio Bear, MD;  Location: [place];  Service: Ophthalmology;  Laterality: Left;  3.80 0:31.7   CATARACT EXTRACTION W/PHACO Right 10/08/2021   Procedure: CATARACT EXTRACTION PHACO AND INTRAOCULAR LENS PLACEMENT ([facility]) RIGHT 3.84 00:35.3;  Surgeon: Eulogio Bear, MD;  Location: [place];  Service: Ophthalmology;  Laterality: Right;   COLONOSCOPY WITH PROPOFOL N/A 04/07/2018   Procedure: COLONOSCOPY WITH PROPOFOL;  Surgeon: [name], MD;  Location: [facility] ENDOSCOPY;  Service: Endoscopy;  Laterality: N/A;   KYPHOPLASTY N/A 10/23/2017   Procedure: [id number];  Surgeon: Hessie Knows, MD;  Location: [facility] ORS;  Service: Orthopedics;  Laterality: N/A;    Medications Prior to Admission  Medication Sig Dispense Refill Last Dose   ALPRAZolam (XANAX) 0.5 MG tablet Take 1 tablet (0.5 mg total) by mouth 3 (three) times daily as needed for anxiety. 90 tablet 2 Past Week   amLODipine (NORVASC) 5  MG tablet TAKE 1 TABLET(5 MG) BY MOUTH DAILY 90 tablet 3 Past Week   aspirin 81 MG tablet Take 81 mg by mouth daily.   Past Week   Calcium 600-200 MG-UNIT tablet Take 1 tablet by mouth daily.   Past Week   cholecalciferol (VITAMIN D) 25 MCG (1000 UNIT) tablet Take 1,000 Units by mouth daily.   Past Week   citalopram (CELEXA) 40 MG tablet TAKE 1 TABLET(40 MG) BY  MOUTH DAILY 30 tablet 3 Past Week   gabapentin (NEURONTIN) 600 MG tablet Take 1 tablet (600 mg total) by mouth 3 (three) times daily. 270 tablet 1 Past Week   HYDROcodone-acetaminophen (NORCO/VICODIN) 5-325 MG tablet Take 1 tablet by mouth every 6 (six) hours as needed for moderate pain. 120 tablet 0 Past Week   lisinopril (ZESTRIL) 40 MG tablet TAKE 1 TABLET(40 MG) BY MOUTH DAILY 90 tablet 0 Past Week   meloxicam (MOBIC) 7.5 MG tablet Take 1 tablet (7.5 mg total) by mouth daily as needed for pain (arthritis). 90 tablet 1 Past Week   metoprolol succinate (TOPROL-XL) 25 MG 24 hr tablet TAKE 1 TABLET(25 MG) BY MOUTH DAILY 90 tablet 3 Past Week   simvastatin (ZOCOR) 10 MG tablet TAKE 1 TABLET(10 MG) BY MOUTH DAILY 90 tablet 3 Past Week   albuterol (VENTOLIN HFA) 108 (90 Base) MCG/ACT inhaler Inhale 2 puffs into the lungs every 4 (four) hours as needed for wheezing or shortness of breath. 18 g 5 prn   docusate sodium (COLACE) 100 MG capsule Take 100 mg by mouth 2 (two) times daily as needed for mild constipation.   prn   furosemide (LASIX) 20 MG tablet Take 20 mg by mouth daily as needed for fluid.    prn   pantoprazole (PROTONIX) 20 MG tablet TAKE 1 TABLET(20 MG) BY MOUTH DAILY (Patient not taking: Reported on 12/27/2021) 90 tablet 1 Not Taking   potassium chloride SA (K-DUR,KLOR-CON) 20 MEQ tablet Take 1 tablet (20 mEq total) by mouth daily. 90 tablet 1 prn   Propylene Glycol (SYSTANE COMPLETE OP) Apply to eye.   prn   Tiotropium Bromide Monohydrate (SPIRIVA RESPIMAT) 1.25 MCG/ACT AERS Inhale 1 puff into the lungs daily. (Patient not taking: Reported on 12/27/2021) 4 g 5 Not Taking   Social History   Socioeconomic History   Marital status: Widowed    Spouse name: Not on file   Number of children: 1   Years of education: Not on file   Highest education level: Some college, no degree  Occupational History    Employer: RETIRED  Tobacco Use   Smoking status: Every Day    Packs/day: 0.50    Years:  53.00    Pack years: 26.50    Types: Cigarettes   Smokeless tobacco: Never   Tobacco comments:    Since age 17  Vaping Use   Vaping Use: Never used  Substance and Sexual Activity   Alcohol use: Not Currently    Alcohol/week: 2.0 standard drinks    Types: 2 Glasses of wine per week   Drug use: Not Currently    Types: Marijuana    Comment: in the past   Sexual activity: Not on file  Other Topics Concern   Not on file  Social History Narrative   Not on file   Social Determinants of Health   Financial Resource Strain: Low Risk    Difficulty of Paying Living Expenses: Not hard at all  Food Insecurity: No Food Insecurity   Worried  About Running Out of Food in the Last Year: Never true   Ran Out of Food in the Last Year: Never true  Transportation Needs: No Transportation Needs   Lack of Transportation (Medical): No   Lack of Transportation (Non-Medical): No  Physical Activity: Inactive   Days of Exercise per Week: 0 days   Minutes of Exercise per Session: 0 min  Stress: Stress Concern Present   Feeling of Stress : Rather much  Social Connections: Socially Isolated   Frequency of Communication with Friends and Family: More than three times a week   Frequency of Social Gatherings with Friends and Family: Never   Attends Religious Services: Never   Marine scientist or Organizations: No   Attends Archivist Meetings: Never   Marital Status: Widowed  Human resources officer Violence: Not on file    Family History  Problem Relation Age of Onset   Leukemia Mother    Aneurysm Father    Diabetes Father    Heart disease Father    Liver cancer Sister    Cancer Sister    Lung cancer Sister        metastatic   Diabetes Maternal Grandmother    Diabetes Paternal Grandmother       PHYSICAL EXAM  General: Elderly and frail-appearing Caucasian female, lying in ICU bed awake and able to follow commands. Appears uncomfortable.  HEENT:  Normocephalic and atraumatic Neck:   No JVD.  Lungs: slight tachypenia on O2 by [place], expiratory wheezes  Heart: Tachycardic but regular.  S1 present with soft S2, no murmur appreciated today Abdomen: Nondistended appearing Msk: Spontaneously moves her right upper and lower extremity, flaccid left upper and lower extremity. 4/5 grip right hand Extremities: No clubbing, cyanosis or edema.  SCDs Neuro: Alert, responds in mostly yes and no, limited vocabulary Psych: Alert, able to follow commands  Labs:   Lab Results  Component Value Date   WBC 12.7 (H) 12/28/2021   HGB 11.4 (L) 12/28/2021   HCT 36.0 12/28/2021   MCV 95.0 12/28/2021   PLT 183 12/28/2021    Recent Labs  Lab 12/26/21 1202 12/27/21 0414 12/28/21 0416  NA 137   < > 142  K 4.6   < > 4.2  CL 103   < > 109  CO2 23   < > 23  BUN 27*   < > 58*  CREATININE 1.56*   < > 2.35*  CALCIUM 8.8*   < > 8.6*  PROT 7.1  --   --   BILITOT 0.5  --   --   ALKPHOS 75  --   --   ALT 28  --   --   AST 51*  --   --   GLUCOSE 112*   < > 124*   < > = values in this interval not displayed.    Lab Results  Component Value Date   CKTOTAL 127 12/26/2021   TROPONINI 0.03 (HH) 01/14/2018     Lab Results  Component Value Date   CHOL 142 12/12/2020   CHOL 192 09/26/2017   CHOL 151 08/28/2016   Lab Results  Component Value Date   HDL 61 12/12/2020   HDL 53 09/26/2017   HDL 62 08/28/2016   Lab Results  Component Value Date   LDLCALC 62 12/12/2020   LDLCALC 106 (H) 09/26/2017   LDLCALC 68 08/28/2016   Lab Results  Component Value Date   TRIG 103 12/12/2020   TRIG 164 (H)  09/26/2017   TRIG 107 08/28/2016   Lab Results  Component Value Date   CHOLHDL 2.3 12/12/2020   CHOLHDL 3.6 09/26/2017   CHOLHDL 2.4 08/28/2016   No results found for: LDLDIRECT    Radiology: DG Abdomen 1 View  Result Date: 12/26/2021 CLINICAL DATA:  Orogastric tube placement EXAM: ABDOMEN - 1 VIEW COMPARISON:  Chest radiograph 12/26/2021 and MRI abdomen 08/31/2020 FINDINGS: Pulmonary  nodule versus nipple shadows in the lungs. Emphysema. Extensive atherosclerosis. Aortic Atherosclerosis (ICD10-I70.0). Possible left pleural thickening at the lung base, cannot exclude pleural effusion. L1 vertebral augmentation. Bony demineralization. The orogastric tube tip is in the stomach body with side port in the stomach body. IMPRESSION: 1. Orogastric tube tip and side port in the stomach body. 2. Possible left pleural effusion. 3. Emphysema and interstitial accentuation in the lungs. 4. Aortic Atherosclerosis (ICD10-I70.0) and Emphysema (ICD10-J43.9). 5. Nodularity in the mid lungs, nipple shadows versus true pulmonary nodules. Electronically Signed   By: Van Clines M.D.   On: 12/26/2021 12:52   CT Head Wo Contrast  Result Date: 12/26/2021 CLINICAL DATA:  Mental status change, persistent or worsening, intubated, ams EXAM: CT HEAD WITHOUT CONTRAST TECHNIQUE: Contiguous axial images were obtained from the base of the skull through the vertex without intravenous contrast. RADIATION DOSE REDUCTION: This exam was performed according to the departmental dose-optimization program which includes automated exposure control, adjustment of the mA and/or kV according to patient size and/or use of iterative reconstruction technique. COMPARISON:  2017 FINDINGS: Brain: There is no acute intracranial hemorrhage, mass effect, or edema. Gray-white differentiation is preserved. There is no extra-axial fluid collection. Prominence of the ventricles and sulci reflects minor parenchymal volume loss. Minimal patchy hypoattenuation in the supratentorial white matter is nonspecific but may reflect minor chronic microvascular ischemic changes. Chronic small vessel infarct the posterolateral right putamen. Vascular: There is atherosclerotic calcification at the skull base. Skull: Calvarium is unremarkable. Sinuses/Orbits: No acute finding. Other: None. IMPRESSION: No acute intracranial abnormality. Electronically Signed    By: Macy Mis M.D.   On: 12/26/2021 15:59   CT Angio Chest PE W and/or Wo Contrast  Result Date: 12/26/2021 CLINICAL DATA:  High clinical suspicion for pulmonary embolism EXAM: CT ANGIOGRAPHY CHEST WITH CONTRAST TECHNIQUE: Multidetector CT imaging of the chest was performed using the standard protocol during bolus administration of intravenous contrast. Multiplanar CT image reconstructions and MIPs were obtained to evaluate the vascular anatomy. RADIATION DOSE REDUCTION: This exam was performed according to the departmental dose-optimization program which includes automated exposure control, adjustment of the mA and/or kV according to patient size and/or use of iterative reconstruction technique. CONTRAST:  31m OMNIPAQUE IOHEXOL 350 MG/ML SOLN COMPARISON:  Chest radiograph done earlier today and CT chest done on 05/23/2020 FINDINGS: Cardiovascular: Coronary artery calcifications are seen. Dense calcification is seen in the mitral annulus. Contrast density in the thoracic aorta is less than adequate to evaluate the lumen. There is ectasia of ascending thoracic aorta measuring 3.5 cm. There is focal aneurysmal dilation in the proximal descending thoracic aorta measuring 4.2 cm in AP diameter which measured 4 cm in the previous study. Aortic arch measures 3 cm. Distal thoracic aorta measures 2.9 cm. There is dense calcification filling much of the lumen of proximal abdominal aorta. Mediastinum/Nodes: There are calcified nodules in the mediastinum. Enteric tube is noted traversing the esophagus. Tip of endotracheal tube is at the level of aortic arch. Lungs/Pleura: Severe centrilobular and panlobular emphysema is seen. There is decreased volume in the left lung.  There is dense atelectasis in the left lower lobe. There are faint ground-glass densities in the left upper lobe. Small patchy infiltrates are seen in the right lower lobe. In image 20 of series 7 there is 12 mm pleural-based nodular density in the  posterior left upper lung fields which was not seen in the previous examination. Small to moderate bilateral pleural effusions are seen. There is no pneumothorax. Upper Abdomen: Distal portion of enteric tube is seen in the stomach. Musculoskeletal: There is decrease in height of few of the thoracic vertebral bodies and L1 vertebral body. There is previous vertebroplasty in the L1 vertebra. These findings have not changed significantly. Review of the MIP images confirms the above findings. IMPRESSION: There is no evidence of pulmonary artery embolism. There is decreased volume in the left lung with dense atelectasis in the left lower lobe. This may be caused by mucous plugging in the airways or central obstructing neoplastic process. Follow-up CT and endoscopy as warranted should be considered. Small patchy infiltrates are seen in the right lower lung fields suggesting subsegmental atelectasis. Small to moderate bilateral pleural effusions are seen. There is a new 12 mm pleural-based noncalcified density in the posterior left upper lung fields. This may suggest focal infectious process or scarring or neoplasm. This finding could also be re-evaluated in follow-up CT. 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 Extensive atherosclerotic changes are noted. There is a large calcific density filling much of the lumen of proximal abdominal aorta. Possibility of high-grade stenosis is not excluded. There is no adequate contrast enhancement in the systemic circulation limiting evaluation. Severe centrilobular and panlobular emphysema. Coronary artery calcifications are seen. Other findings as described in the body of the report. Electronically Signed   By: Elmer Picker M.D.   On: 12/26/2021 16:19   MR BRAIN W WO CONTRAST  Result Date: 12/27/2021 CLINICAL DATA:  Stroke, follow-up.  Left-sided weakness. EXAM: MRI HEAD WITHOUT AND WITH CONTRAST TECHNIQUE: Multiplanar, multiecho pulse sequences  of the brain and surrounding structures were obtained without and with intravenous contrast. CONTRAST:  54m GADAVIST GADOBUTROL 1 MMOL/ML IV SOLN COMPARISON:  CT head without contrast 12/26/2021 FINDINGS: Brain: Acute nonhemorrhagic infarct is present in the distal right ACA territory this involves the high medial right frontal lobe. No other acute infarct, hemorrhage, or mass lesion is present. Remote lacunar infarcts are present in the posterior lentiform nucleus bilaterally. Remote lacunar infarcts are present in the thalami bilaterally. Periventricular and subcortical T2 hyperintensities bilaterally are mildly advanced for age. The ventricles are of normal size. No significant extraaxial fluid collection is present. A left paramedian pontine infarct is remote. Cerebellum is within normal limits. Postcontrast images demonstrate no pathologic enhancement. Vascular: Flow is present in the major intracranial arteries. Skull and upper cervical spine: The craniocervical junction is normal. Upper cervical spine is within normal limits. Marrow signal is unremarkable. Sinuses/Orbits: Minimal fluid is present in the inferior mastoid air cells bilaterally. No obstructing nasopharyngeal lesion is present. Bilateral lens replacements are noted. Globes and orbits are otherwise unremarkable. IMPRESSION: 1. Acute nonhemorrhagic infarct involving the high medial right frontal lobe, distal right ACA territory. 2. Remote lacunar infarcts of the posterior lentiform nucleus bilaterally and thalami bilaterally. 3. Periventricular and subcortical T2 hyperintensities bilaterally are mildly advanced for age. This likely reflects the sequela of chronic microvascular ischemia. Electronically Signed   By: CSan MorelleM.D.   On: 12/27/2021 14:45   DG Chest Port 1 View  Result Date: 12/27/2021  CLINICAL DATA:  Mucous plugging EXAM: PORTABLE CHEST 1 VIEW COMPARISON:  Radiograph 12/26/2021, chest CT 12/26/2021 FINDINGS: The patient  is rotated towards the left. Endotracheal tube overlies the midthoracic trachea. Orogastric tube passes below the diaphragm, tip excluded by collimation. Increased left pleural effusion and left mid to lower lung airspace consolidation. Increasing opacities in the left upper lung. Stable small right pleural effusion. Emphysema with interstitial prominence. No pneumothorax. There is a chronic posttraumatic deformity of the right proximal humerus and multiple left ribs. IMPRESSION: Increased left pleural effusion and worsening adjacent left mid to lower lung consolidation, likely worsening atelectasis due to a combination of pleural effusion and mucous plugging. Stable small right pleural effusion. Electronically Signed   By: Maurine Simmering M.D.   On: 12/27/2021 10:39   DG Chest Port 1 View  Result Date: 12/26/2021 CLINICAL DATA:  Questionable sepsis. EXAM: PORTABLE CHEST 1 VIEW COMPARISON:  Radiograph January 14, 2018 FINDINGS: Endotracheal tube with tip projecting over the midthoracic trachea at the level of the aortic arch. Nasogastric tube courses below the diaphragm with tip and side port obscured by collimation. Additional extracorporeal leads overlie the chest and upper abdomen. Enlarged cardiac silhouette with central vascular prominence. Opacity in the left lung base with partial obscuration of left hemidiaphragm. Chronic bronchitic lung changes. No acute osseous abnormality. IMPRESSION: Opacity in the left lung base with partial obscuration of left hemidiaphragm. Could reflect an effusion and/or atelectasis/pneumonia. Electronically Signed   By: Dahlia Bailiff M.D.   On: 12/26/2021 12:53   ECHOCARDIOGRAM COMPLETE  Result Date: 12/27/2021    ECHOCARDIOGRAM REPORT   Patient Name:   [name] [name] Date of Exam: 12/27/2021 Medical Rec #:  [id number]     Height:       62.0 in Accession #:    [id number]    Weight:       87.7 lb Date of Birth:  [birth year]      BSA:          1.347 m Patient Age:    6 years      BP:            147/91 mmHg Patient Gender: F             HR:           100 bpm. Exam Location:  Inpatient Procedure: 2D Echo, Cardiac Doppler, Color Doppler and 3D Echo Indications:     CHF-Acute Systolic I95.18  History:         Patient has prior history of Echocardiogram examinations, most                  recent 04/02/2016. CAD, Stroke and COPD; Risk                  Factors:Hypertension, Diabetes, Dyslipidemia and Current                  Smoker. GERD.  Sonographer:     Darlina Sicilian RDCS Referring Phys:  [id number] Alanson Puls Tyrel Lex Diagnosing Phys: Isaias Cowman MD  Sonographer Comments: Echo performed with patient supine and on artificial respirator. IMPRESSIONS  1. Left ventricular ejection fraction, by estimation, is 60 to 65%. The left ventricle has normal function. The left ventricle has no regional wall motion abnormalities. There is moderate left ventricular hypertrophy. Left ventricular diastolic parameters were normal.  2. Right ventricular systolic function is normal. The right ventricular size is normal. There is severely elevated pulmonary artery systolic pressure.  3. The  mitral valve is normal in structure. Mild to moderate mitral valve regurgitation. No evidence of mitral stenosis.  4. Tricuspid valve regurgitation is moderate to severe.  5. The aortic valve is normal in structure. Aortic valve regurgitation is mild. No aortic stenosis is present.  6. The inferior vena cava is normal in size with greater than 50% respiratory variability, suggesting right atrial pressure of 3 mmHg. FINDINGS  Left Ventricle: Left ventricular ejection fraction, by estimation, is 60 to 65%. The left ventricle has normal function. The left ventricle has no regional wall motion abnormalities. The left ventricular internal cavity size was normal in size. There is  moderate left ventricular hypertrophy. Left ventricular diastolic parameters were normal. Right Ventricle: The right ventricular size is normal. No increase in  right ventricular wall thickness. Right ventricular systolic function is normal. There is severely elevated pulmonary artery systolic pressure. The tricuspid regurgitant velocity is 4.04 m/s, and with an assumed right atrial pressure of 8 mmHg, the estimated right ventricular systolic pressure is 70.0 mmHg. Left Atrium: Left atrial size was normal in size. Right Atrium: Right atrial size was normal in size. Pericardium: There is no evidence of pericardial effusion. Mitral Valve: The mitral valve is normal in structure. Mild to moderate mitral valve regurgitation. No evidence of mitral valve stenosis. Tricuspid Valve: The tricuspid valve is normal in structure. Tricuspid valve regurgitation is moderate to severe. No evidence of tricuspid stenosis. Aortic Valve: The aortic valve is normal in structure. Aortic valve regurgitation is mild. No aortic stenosis is present. Pulmonic Valve: The pulmonic valve was normal in structure. Pulmonic valve regurgitation is not visualized. No evidence of pulmonic stenosis. Aorta: The aortic root is normal in size and structure. Venous: The inferior vena cava is normal in size with greater than 50% respiratory variability, suggesting right atrial pressure of 3 mmHg. IAS/Shunts: No atrial level shunt detected by color flow Doppler.  LEFT VENTRICLE PLAX 2D LVIDd:         3.00 cm   Diastology LVIDs:         1.50 cm   LV e' medial:    4.12 cm/s LV PW:         1.50 cm   LV E/e' medial:  31.3 LV IVS:        1.70 cm   LV e' lateral:   6.40 cm/s LVOT diam:     1.80 cm   LV E/e' lateral: 20.2 LV SV:         40 LV SV Index:   30 LVOT Area:     2.54 cm  RIGHT VENTRICLE RV S prime:     11.90 cm/s TAPSE (M-mode): 1.8 cm LEFT ATRIUM             Index        RIGHT ATRIUM           Index LA diam:        4.10 cm 3.04 cm/m   RA Area:     10.50 cm LA Vol (A2C):   56.7 ml 42.10 ml/m  RA Volume:   19.40 ml  14.40 ml/m LA Vol (A4C):   52.8 ml 39.20 ml/m LA Biplane Vol: 55.0 ml 40.83 ml/m  AORTIC  VALVE LVOT Vmax:   101.00 cm/s LVOT Vmean:  58.600 cm/s LVOT VTI:    0.159 m  AORTA Ao Root diam: 3.10 cm Ao Asc diam:  3.30 cm MITRAL VALVE  TRICUSPID VALVE MV Area (PHT): 3.26 cm     TR Peak grad:   65.3 mmHg MV Decel Time: 233 msec     TR Vmax:        404.00 cm/s MV E velocity: 129.00 cm/s MV A velocity: 124.00 cm/s  SHUNTS MV E/A ratio:  1.04         Systemic VTI:  0.16 m                             Systemic Diam: 1.80 cm Isaias Cowman MD Electronically signed by Isaias Cowman MD Signature Date/Time: 12/27/2021/3:27:38 PM    Final     EKG: Sinus rhythm with LVH and associated ST-T wave abnormalities  ECHO 03/2016  ECHOCARDIOGRAPHIC DESCRIPTIONS   AORTIC ROOT          Size:Normal    Dissection:INDETERM FOR DISSECTION   AORTIC VALVE      Leaflets:Tricuspid         Morphology:Normal      Mobility:Fully mobile   LEFT VENTRICLE          Size:Normal              Anterior:Normal   Contraction:Normal               Lateral:Normal    Closest EF:>55% (Estimated)      Septal:Normal     LV Masses:No Masses             Apical:Normal           PYP:PJKD LVH            Inferior:Normal                                  Posterior:Normal  Dias.FxClass:N/A   MITRAL VALVE      Leaflets:Normal              Mobility:Fully mobile    Morphology:ANNULAR CALC   LEFT ATRIUM          Size:Normal             LA Masses:No masses   MAIN PA          Size:Normal   PULMONIC VALVE      Leaflets:N/A               Morphology:Normal      Mobility:Fully mobile   RIGHT VENTRICLE     RV Masses:No Masses               Size:Normal     Free Wall:Normal           Contraction:Normal   TRICUSPID VALVE      Leaflets:Normal              Mobility:Fully mobile    Morphology:Normal   RIGHT ATRIUM          Size:Normal              RA Other:None       RA Mass:No masses   PERICARDIUM         Fluid:No effusion   INFERIOR VENACAVA          Size:Normal Normal respiratory collapse    ____________________________________________________________________   DOPPLER ECHO and OTHER SPECIAL PROCEDURES     Aortic:No AR  No AS            143.5 cm/sec peak vel         8.2 mmHg peak grad      Mitral:MILD MR                       No MS                                          3.6 cm^2 by DOPPLER            MV Inflow E Vel=62.9 cm/sec   MV Annulus E'Vel=3.7 cm/sec            E/E'Ratio=17.0   Tricuspid:MILD TR                       No TS            233.5 cm/sec peak TR vel      26.8 mmHg peak RV pressure   Pulmonary:MILD PR                       No PS            95.2 cm/sec peak vel          3.6 mmHg peak grad     ___________________________________________________________________________________________   INTERPRETATION  NORMAL LEFT VENTRICULAR SYSTOLIC FUNCTION   WITH MILD LVH  NORMAL RIGHT VENTRICULAR SYSTOLIC FUNCTION  MILD VALVULAR REGURGITATION (See above)  NO VALVULAR STENOSIS  EF >55%   ASSESSMENT AND PLAN:   1.  Elevated troponin, likely demand in the setting of acute CVA,in the absence of chest pain or new ECG changes, in the setting of respiratory failure, possible pneumonia, COPD, trop peak 18000  2.  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  Respiratory failure, possible pneumonia and patient with known advanced  COPD, intubated 4.  Acute kidney injury, BUN and Cr 27 and 1.56, respectively w/ GFR 34, likely secondary to ATN  Recommendations  1.  Agree with current therapy 2.  MRI brain with acute CVA, neuro consulted by primary, s/p rectal aspirin 3.  Defer full dose anticoagulation at this time 4.  Failed speech eval, family does not want NG tube, continue IV antihypertensives 5.  2D echocardiogram resulted with preserved EF, without WMAs, mod LVH, mi-mod MR, mi-mod TR, elevated pulmonary artery pressure 6.  Consider gentle diuresis if respiratory  status does not improve 7.  No further cardiac diagnostics necessary.   Cardiology signing off, please reengage if needed.   This patient's plan of care was discussed and created with Dr. Saralyn Pilar and he is in agreement.    Signed: Tristan Schroeder, PA-C 12/28/2021, 12:19 PM

## 2021-12-29 DIAGNOSIS — N179 Acute kidney failure, unspecified: Secondary | ICD-10-CM | POA: Diagnosis present

## 2021-12-29 DIAGNOSIS — I639 Cerebral infarction, unspecified: Secondary | ICD-10-CM | POA: Diagnosis not present

## 2021-12-29 DIAGNOSIS — I214 Non-ST elevation (NSTEMI) myocardial infarction: Secondary | ICD-10-CM | POA: Diagnosis present

## 2021-12-29 DIAGNOSIS — D72829 Elevated white blood cell count, unspecified: Secondary | ICD-10-CM | POA: Diagnosis present

## 2021-12-29 DIAGNOSIS — R1310 Dysphagia, unspecified: Secondary | ICD-10-CM

## 2021-12-29 DIAGNOSIS — K922 Gastrointestinal hemorrhage, unspecified: Secondary | ICD-10-CM | POA: Diagnosis present

## 2021-12-29 DIAGNOSIS — E875 Hyperkalemia: Secondary | ICD-10-CM | POA: Diagnosis present

## 2021-12-29 LAB — CULTURE, RESPIRATORY W GRAM STAIN: Culture: NORMAL

## 2021-12-29 MED ORDER — GLYCOPYRROLATE 0.2 MG/ML IJ SOLN: 0.2000 mg | INTRAMUSCULAR | Status: AC | PRN

## 2021-12-29 MED ORDER — LORAZEPAM 2 MG/ML PO CONC: 1.0000 mg | ORAL | 0 refills | Status: AC | PRN

## 2021-12-29 MED ORDER — HYDROMORPHONE HCL 1 MG/ML IJ SOLN: 0.5000 mg | INTRAMUSCULAR | 0 refills | Status: AC | PRN

## 2021-12-29 NOTE — TOC Transition Note (Signed)
Transition of Care [facility]) - CM/SW Discharge Note   Patient Details  Name: [name] [name] MRN: [id number] Date of Birth: [birth year]  [facility] CM/SW Contact:  Izola Price, RN Phone Number: 12/29/2021, 10:50 AM   Clinical Narrative:  1050 am. Patient has a bed at residential hospice per hospice liaison who has obtained consents and transport to be arranged via [facility]. Report to be called to [phone] at residential hospice. Simmie Davies RN CM     Final next level of care: Hospice Medical Facility Barriers to Discharge: Barriers Resolved   Patient Goals and CMS Choice Patient states their goals for this hospitalization and ongoing recovery are:: family has decided that they would like for the patient to go to hospice home CMS Medicare.gov Compare Post Acute Care list provided to:: Patient Represenative (must comment) Choice offered to / list presented to :  (Per hospice liaison and prior CM notes.)  Discharge Placement                Patient to be transferred to facility by: [place] Name of family member notified:  (Per Hospice Liaison) Patient and family notified of of transfer: 12/29/21  Discharge Plan and Services   Discharge Planning Services: CM Consult Post Acute Care Choice: Hospice          DME Arranged: N/A DME Agency: NA       HH Arranged: NA HH Agency: NA        Social Determinants of Health ([place]) Interventions     Readmission Risk Interventions     View : No data to display.

## 2021-12-29 NOTE — Discharge Summary (Signed)
Physician Discharge Summary   Patient: [name] [name] MRN: [id number] DOB: [birth year]  Admit date:     12/26/2021  Discharge date: 12/29/21  Discharge Physician: Berle Mull  PCP: Olin Hauser, DO  Recommendations at discharge: Establish care with hospice.  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  EMS was called and patient required bag-valve-mask ventilation. In ED required intubation.  OG tube had some blurred.  Patient was admitted to the ICU.  Troponins were elevated.  Work-up showed patient suffered acute CVA.  Neurology and cardiology both were consulted.  Patient did not passed swallowing evaluation.  Due to her multiple comorbidities worsening renal function and inability to swallow safely family decided to pursue comfort care.  Hospice was consulted and patient currently being transferred to hospice.  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  Possibly embolic versus small vessel disease. Patient on aspirin. Patient did not pass swallow evaluation. Family decided not to pursue any feeding tube or NG tube. Currently comfort care.    Acute hypoxemic respiratory failure (HCC)   Bilateral pleural effusion   COPD (chronic obstructive pulmonary disease) ([place]) Requiring intubation in the ER  for airway protection. Likely suspected to have aspiration event/mucous plugging. Currently extubated. Chest x-ray showed worsening pleural effusion on the left.  Possibility of a left consolidation as well. Family decided to transition to complete comfort.    Severe tricuspid regurgitation   Non-STEMI (non-ST elevated myocardial infarction) type II/comfort care Presents with unresponsive event. Initial troponin was 320.  Trended up to 18,000.  Now trending down to 11,000. EKG unremarkable for any acute ischemia. Echocardiogram shows preserved EF with LVH. Chronic now severe TR. Cardiology was consulted. Recommend conservative measures as the patient not a good candidate for aggressive intervention due to her AKI. Currently comfort care.  No indication for any core measures on discharge due to Comfort Care status.    AKI (acute kidney injury) possibly ATN   Hyperkalemia Baseline serum creatinine around 1.0. On presentation serum creatinine 2.01.  Currently trending up to 2.35. Suspected hemodynamic injury as well as ATN. Currently comfort care.    possible GI bleed Patient had an OG tube placed in the ER had some blood tinged substance suctioned. H&H remained stable.    Dysphagia   Protein-calorie malnutrition, severe Body mass index is 16.12 kg/m.  Underweight Unable to tolerate p.o. based on speech therapy evaluation. Rendering poor prognosis. Currently comfort care.    Pressure injury of skin Stage I sacral ulcer, medial present on admission. Comfort care.     Leukocytosis Possibility of pneumonia cannot be ruled out. Currently comfort care.  Consultants: Primary admission with PCCM, cardiology, palliative care, neurology Procedures performed:  Echocardiogram DISCHARGE MEDICATION: Allergies as of 12/29/2021   No Known Allergies      Medication List     STOP taking these medications    ALPRAZolam  0.5 MG tablet Commonly known as: XANAX   amLODipine 5 MG  tablet Commonly known as: NORVASC   aspirin 81 MG tablet   Calcium 600-200 MG-UNIT tablet   cholecalciferol 25 MCG (1000 UNIT) tablet Commonly known as: VITAMIN D   citalopram 40 MG tablet Commonly known as: CELEXA   docusate sodium 100 MG capsule Commonly known as: COLACE   furosemide 20 MG tablet Commonly known as: LASIX   gabapentin 600 MG tablet Commonly known as: Neurontin   HYDROcodone-acetaminophen 5-325 MG tablet Commonly known as: NORCO/VICODIN   lisinopril 40 MG tablet Commonly known as: ZESTRIL   meloxicam 7.5 MG tablet Commonly known as: MOBIC   metoprolol succinate 25 MG 24 hr tablet Commonly known as: TOPROL-XL   pantoprazole 20 MG tablet Commonly known as: PROTONIX   potassium chloride SA 20 MEQ tablet Commonly known as: KLOR-CON M   simvastatin 10 MG tablet Commonly known as: ZOCOR   Spiriva Respimat 1.25 MCG/ACT Aers Generic drug: Tiotropium Bromide Monohydrate   SYSTANE COMPLETE OP       TAKE these medications    albuterol 108 (90 Base) MCG/ACT inhaler Commonly known as: VENTOLIN HFA Inhale 2 puffs into the lungs every 4 (four) hours as needed for wheezing or shortness of breath.   glycopyrrolate 0.2 MG/ML injection Commonly known as: ROBINUL Inject 1 mL (0.2 mg total) into the skin every 4 (four) hours as needed (excessive secretions).   HYDROmorphone 1 MG/ML injection Commonly known as: DILAUDID Inject 0.5 mLs (0.5 mg total) into the vein every 2 (two) hours as needed for severe pain (or dyspnea).   LORazepam 2 MG/ML concentrated solution Commonly known as: ATIVAN Place 0.5 mLs (1 mg total) under the tongue every 4 (four) hours as needed for anxiety.        Disposition: Residential hospice Diet recommendation: Comfort feeding  Discharge Exam: Vitals:   12/28/21 2300 12/29/21 0000 12/29/21 0100 12/29/21 0906  BP: (!) 157/67   (!) 75/51  Pulse: 96 (!) 106 (!) 103 (!) 104  Resp: (!) _0 Temp:    97.8 F (36.6  C)  TempSrc:      SpO2: 94% 90% 91% 94%  Weight:      Height:       General: Appear in mild distress; no visible Abnormal Neck Mass Or lumps, Conjunctiva normal Cardiovascular: S1 and S2 Present, no Murmur, Respiratory: good respiratory effort, Bilateral Air entry present and CTA, no Crackles, no wheezes Abdomen: Bowel Sound preset Neurology: alert Gait not checked due to patient safety concerns Filed Weights   12/26/21 1555 12/27/21 0400 12/28/21 0500  Weight: 41.3 kg 39.8 kg 40 kg   Condition at discharge: stable  The results of significant diagnostics from this hospitalization (including imaging, microbiology, ancillary and laboratory) are listed below for reference.   Imaging Studies: DG Abdomen 1 View  Result Date: 12/26/2021 CLINICAL DATA:  Orogastric tube placement EXAM: ABDOMEN - 1 VIEW COMPARISON:  Chest radiograph 12/26/2021 and MRI abdomen 08/31/2020 FINDINGS: Pulmonary nodule versus nipple shadows in the lungs. Emphysema. Extensive atherosclerosis. Aortic Atherosclerosis (ICD10-I70.0). Possible left pleural thickening at the lung base, cannot exclude pleural effusion. L1 vertebral augmentation. Bony demineralization. The orogastric tube tip is in the stomach body with side port in the stomach body. IMPRESSION: 1. Orogastric tube tip and side port in the stomach body. 2. Possible left pleural effusion. 3. Emphysema and interstitial accentuation in the lungs. 4. Aortic Atherosclerosis (ICD10-I70.0) and Emphysema (ICD10-J43.9). 5. Nodularity in the  mid lungs, nipple shadows versus true pulmonary nodules. Electronically Signed   By: Van Clines M.D.   On: 12/26/2021 12:52   CT Head Wo Contrast  Result Date: 12/26/2021 CLINICAL DATA:  Mental status change, persistent or worsening, intubated, ams EXAM: CT HEAD WITHOUT CONTRAST TECHNIQUE: Contiguous axial images were obtained from the base of the skull through the vertex without intravenous contrast. RADIATION DOSE REDUCTION:  This exam was performed according to the departmental dose-optimization program which includes automated exposure control, adjustment of the mA and/or kV according to patient size and/or use of iterative reconstruction technique. COMPARISON:  2017 FINDINGS: Brain: There is no acute intracranial hemorrhage, mass effect, or edema. Gray-white differentiation is preserved. There is no extra-axial fluid collection. Prominence of the ventricles and sulci reflects minor parenchymal volume loss. Minimal patchy hypoattenuation in the supratentorial white matter is nonspecific but may reflect minor chronic microvascular ischemic changes. Chronic small vessel infarct the posterolateral right putamen. Vascular: There is atherosclerotic calcification at the skull base. Skull: Calvarium is unremarkable. Sinuses/Orbits: No acute finding. Other: None. IMPRESSION: No acute intracranial abnormality. Electronically Signed   By: Macy Mis M.D.   On: 12/26/2021 15:59   CT Angio Chest PE W and/or Wo Contrast  Result Date: 12/26/2021 CLINICAL DATA:  High clinical suspicion for pulmonary embolism EXAM: CT ANGIOGRAPHY CHEST WITH CONTRAST TECHNIQUE: Multidetector CT imaging of the chest was performed using the standard protocol during bolus administration of intravenous contrast. Multiplanar CT image reconstructions and MIPs were obtained to evaluate the vascular anatomy. RADIATION DOSE REDUCTION: This exam was performed according to the departmental dose-optimization program which includes automated exposure control, adjustment of the mA and/or kV according to patient size and/or use of iterative reconstruction technique. CONTRAST:  37m OMNIPAQUE IOHEXOL 350 MG/ML SOLN COMPARISON:  Chest radiograph done earlier today and CT chest done on 05/23/2020 FINDINGS: Cardiovascular: Coronary artery calcifications are seen. Dense calcification is seen in the mitral annulus. Contrast density in the thoracic aorta is less than adequate to  evaluate the lumen. There is ectasia of ascending thoracic aorta measuring 3.5 cm. There is focal aneurysmal dilation in the proximal descending thoracic aorta measuring 4.2 cm in AP diameter which measured 4 cm in the previous study. Aortic arch measures 3 cm. Distal thoracic aorta measures 2.9 cm. There is dense calcification filling much of the lumen of proximal abdominal aorta. Mediastinum/Nodes: There are calcified nodules in the mediastinum. Enteric tube is noted traversing the esophagus. Tip of endotracheal tube is at the level of aortic arch. Lungs/Pleura: Severe centrilobular and panlobular emphysema is seen. There is decreased volume in the left lung. There is dense atelectasis in the left lower lobe. There are faint ground-glass densities in the left upper lobe. Small patchy infiltrates are seen in the right lower lobe. In image 20 of series 7 there is 12 mm pleural-based nodular density in the posterior left upper lung fields which was not seen in the previous examination. Small to moderate bilateral pleural effusions are seen. There is no pneumothorax. Upper Abdomen: Distal portion of enteric tube is seen in the stomach. Musculoskeletal: There is decrease in height of few of the thoracic vertebral bodies and L1 vertebral body. There is previous vertebroplasty in the L1 vertebra. These findings have not changed significantly. Review of the MIP images confirms the above findings. IMPRESSION: There is no evidence of pulmonary artery embolism. There is decreased volume in the left lung with dense atelectasis in the left lower lobe. This may be caused by mucous  plugging in the airways or central obstructing neoplastic process. Follow-up CT and endoscopy as warranted should be considered. Small patchy infiltrates are seen in the right lower lung fields suggesting subsegmental atelectasis. Small to moderate bilateral pleural effusions are seen. There is a new 12 mm pleural-based noncalcified density in the  posterior left upper lung fields. This may suggest focal infectious process or scarring or neoplasm. This finding could also be re-evaluated in follow-up CT. 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 Extensive atherosclerotic changes are noted. There is a large calcific density filling much of the lumen of proximal abdominal aorta. Possibility of high-grade stenosis is not excluded. There is no adequate contrast enhancement in the systemic circulation limiting evaluation. Severe centrilobular and panlobular emphysema. Coronary artery calcifications are seen. Other findings as described in the body of the report. Electronically Signed   By: Elmer Picker M.D.   On: 12/26/2021 16:19   MR BRAIN W WO CONTRAST  Result Date: 12/27/2021 CLINICAL DATA:  Stroke, follow-up.  Left-sided weakness. EXAM: MRI HEAD WITHOUT AND WITH CONTRAST TECHNIQUE: Multiplanar, multiecho pulse sequences of the brain and surrounding structures were obtained without and with intravenous contrast. CONTRAST:  60m GADAVIST GADOBUTROL 1 MMOL/ML IV SOLN COMPARISON:  CT head without contrast 12/26/2021 FINDINGS: Brain: Acute nonhemorrhagic infarct is present in the distal right ACA territory this involves the high medial right frontal lobe. No other acute infarct, hemorrhage, or mass lesion is present. Remote lacunar infarcts are present in the posterior lentiform nucleus bilaterally. Remote lacunar infarcts are present in the thalami bilaterally. Periventricular and subcortical T2 hyperintensities bilaterally are mildly advanced for age. The ventricles are of normal size. No significant extraaxial fluid collection is present. A left paramedian pontine infarct is remote. Cerebellum is within normal limits. Postcontrast images demonstrate no pathologic enhancement. Vascular: Flow is present in the major intracranial arteries. Skull and upper cervical spine: The craniocervical junction is normal. Upper cervical spine is  within normal limits. Marrow signal is unremarkable. Sinuses/Orbits: Minimal fluid is present in the inferior mastoid air cells bilaterally. No obstructing nasopharyngeal lesion is present. Bilateral lens replacements are noted. Globes and orbits are otherwise unremarkable. IMPRESSION: 1. Acute nonhemorrhagic infarct involving the high medial right frontal lobe, distal right ACA territory. 2. Remote lacunar infarcts of the posterior lentiform nucleus bilaterally and thalami bilaterally. 3. Periventricular and subcortical T2 hyperintensities bilaterally are mildly advanced for age. This likely reflects the sequela of chronic microvascular ischemia. Electronically Signed   By: CSan MorelleM.D.   On: 12/27/2021 14:45   DG Chest Port 1 View  Result Date: 12/27/2021 CLINICAL DATA:  Mucous plugging EXAM: PORTABLE CHEST 1 VIEW COMPARISON:  Radiograph 12/26/2021, chest CT 12/26/2021 FINDINGS: The patient is rotated towards the left. Endotracheal tube overlies the midthoracic trachea. Orogastric tube passes below the diaphragm, tip excluded by collimation. Increased left pleural effusion and left mid to lower lung airspace consolidation. Increasing opacities in the left upper lung. Stable small right pleural effusion. Emphysema with interstitial prominence. No pneumothorax. There is a chronic posttraumatic deformity of the right proximal humerus and multiple left ribs. IMPRESSION: Increased left pleural effusion and worsening adjacent left mid to lower lung consolidation, likely worsening atelectasis due to a combination of pleural effusion and mucous plugging. Stable small right pleural effusion. Electronically Signed   By: JMaurine SimmeringM.D.   On: 12/27/2021 10:39   DG Chest Port 1 View  Result Date: 12/26/2021 CLINICAL DATA:  Questionable sepsis. EXAM: PORTABLE CHEST 1  VIEW COMPARISON:  Radiograph January 14, 2018 FINDINGS: Endotracheal tube with tip projecting over the midthoracic trachea at the level of the  aortic arch. Nasogastric tube courses below the diaphragm with tip and side port obscured by collimation. Additional extracorporeal leads overlie the chest and upper abdomen. Enlarged cardiac silhouette with central vascular prominence. Opacity in the left lung base with partial obscuration of left hemidiaphragm. Chronic bronchitic lung changes. No acute osseous abnormality. IMPRESSION: Opacity in the left lung base with partial obscuration of left hemidiaphragm. Could reflect an effusion and/or atelectasis/pneumonia. Electronically Signed   By: Dahlia Bailiff M.D.   On: 12/26/2021 12:53   ECHOCARDIOGRAM COMPLETE  Result Date: 12/27/2021    ECHOCARDIOGRAM REPORT   Patient Name:   LAMICA MCCART Date of Exam: 12/27/2021 Medical Rec #:  [id number]     Height:       62.0 in Accession #:    [id number]    Weight:       87.7 lb Date of Birth:  Nov 10, 1945      BSA:          1.347 m Patient Age:    27 years      BP:           147/91 mmHg Patient Gender: F             HR:           100 bpm. Exam Location:  Inpatient Procedure: 2D Echo, Cardiac Doppler, Color Doppler and 3D Echo Indications:     CHF-Acute Systolic W10.27  History:         Patient has prior history of Echocardiogram examinations, most                  recent 04/02/2016. CAD, Stroke and COPD; Risk                  Factors:Hypertension, Diabetes, Dyslipidemia and Current                  Smoker. GERD.  Sonographer:     Darlina Sicilian RDCS Referring Phys:  [id number] Alanson Puls TANG Diagnosing Phys: Isaias Cowman MD  Sonographer Comments: Echo performed with patient supine and on artificial respirator. IMPRESSIONS  1. Left ventricular ejection fraction, by estimation, is 60 to 65%. The left ventricle has normal function. The left ventricle has no regional wall motion abnormalities. There is moderate left ventricular hypertrophy. Left ventricular diastolic parameters were normal.  2. Right ventricular systolic function is normal. The right ventricular size is  normal. There is severely elevated pulmonary artery systolic pressure.  3. The mitral valve is normal in structure. Mild to moderate mitral valve regurgitation. No evidence of mitral stenosis.  4. Tricuspid valve regurgitation is moderate to severe.  5. The aortic valve is normal in structure. Aortic valve regurgitation is mild. No aortic stenosis is present.  6. The inferior vena cava is normal in size with greater than 50% respiratory variability, suggesting right atrial pressure of 3 mmHg. FINDINGS  Left Ventricle: Left ventricular ejection fraction, by estimation, is 60 to 65%. The left ventricle has normal function. The left ventricle has no regional wall motion abnormalities. The left ventricular internal cavity size was normal in size. There is  moderate left ventricular hypertrophy. Left ventricular diastolic parameters were normal. Right Ventricle: The right ventricular size is normal. No increase in right ventricular wall thickness. Right ventricular systolic function is normal. There is severely elevated pulmonary artery systolic pressure. The tricuspid regurgitant  velocity is 4.04 m/s, and with an assumed right atrial pressure of 8 mmHg, the estimated right ventricular systolic pressure is 40.3 mmHg. Left Atrium: Left atrial size was normal in size. Right Atrium: Right atrial size was normal in size. Pericardium: There is no evidence of pericardial effusion. Mitral Valve: The mitral valve is normal in structure. Mild to moderate mitral valve regurgitation. No evidence of mitral valve stenosis. Tricuspid Valve: The tricuspid valve is normal in structure. Tricuspid valve regurgitation is moderate to severe. No evidence of tricuspid stenosis. Aortic Valve: The aortic valve is normal in structure. Aortic valve regurgitation is mild. No aortic stenosis is present. Pulmonic Valve: The pulmonic valve was normal in structure. Pulmonic valve regurgitation is not visualized. No evidence of pulmonic stenosis.  Aorta: The aortic root is normal in size and structure. Venous: The inferior vena cava is normal in size with greater than 50% respiratory variability, suggesting right atrial pressure of 3 mmHg. IAS/Shunts: No atrial level shunt detected by color flow Doppler.  LEFT VENTRICLE PLAX 2D LVIDd:         3.00 cm   Diastology LVIDs:         1.50 cm   LV e' medial:    4.12 cm/s LV PW:         1.50 cm   LV E/e' medial:  31.3 LV IVS:        1.70 cm   LV e' lateral:   6.40 cm/s LVOT diam:     1.80 cm   LV E/e' lateral: 20.2 LV SV:         40 LV SV Index:   30 LVOT Area:     2.54 cm  RIGHT VENTRICLE RV S prime:     11.90 cm/s TAPSE (M-mode): 1.8 cm LEFT ATRIUM             Index        RIGHT ATRIUM           Index LA diam:        4.10 cm 3.04 cm/m   RA Area:     10.50 cm LA Vol (A2C):   56.7 ml 42.10 ml/m  RA Volume:   19.40 ml  14.40 ml/m LA Vol (A4C):   52.8 ml 39.20 ml/m LA Biplane Vol: 55.0 ml 40.83 ml/m  AORTIC VALVE LVOT Vmax:   101.00 cm/s LVOT Vmean:  58.600 cm/s LVOT VTI:    0.159 m  AORTA Ao Root diam: 3.10 cm Ao Asc diam:  3.30 cm MITRAL VALVE                TRICUSPID VALVE MV Area (PHT): 3.26 cm     TR Peak grad:   65.3 mmHg MV Decel Time: 233 msec     TR Vmax:        404.00 cm/s MV E velocity: 129.00 cm/s MV A velocity: 124.00 cm/s  SHUNTS MV E/A ratio:  1.04         Systemic VTI:  0.16 m                             Systemic Diam: 1.80 cm Isaias Cowman MD Electronically signed by Isaias Cowman MD Signature Date/Time: 12/27/2021/3:27:38 PM    Final     Microbiology: Results for orders placed or performed during the hospital encounter of 12/26/21  Blood Culture (routine x 2)     Status: None (Preliminary result)  Collection Time: 12/26/21 12:02 PM   Specimen: BLOOD  Result Value Ref Range Status   Specimen Description BLOOD LEFT ANTECUBITAL  Final   Special Requests   Final    BOTTLES DRAWN AEROBIC AND ANAEROBIC Blood Culture adequate volume   Culture   Final    NO GROWTH 3  DAYS Performed at [facility], [address], [place], [place] [address]    Report Status PENDING  Incomplete  Blood Culture (routine x 2)     Status: None (Preliminary result)   Collection Time: 12/26/21 12:02 PM   Specimen: BLOOD  Result Value Ref Range Status   Specimen Description BLOOD BLOOD RIGHT WRIST  Final   Special Requests   Final    BOTTLES DRAWN AEROBIC AND ANAEROBIC Blood Culture adequate volume   Culture   Final    NO GROWTH 3 DAYS Performed at [facility], [address] [place], [place] [address]    Report Status PENDING  Incomplete  Resp Panel by RT-PCR (Flu A&B, Covid) Anterior Nasal Swab     Status: None   Collection Time: 12/26/21 12:27 PM   Specimen: Anterior Nasal Swab  Result Value Ref Range Status   SARS Coronavirus 2 by RT PCR NEGATIVE NEGATIVE Final    Comment: (NOTE) SARS-CoV-2 target nucleic acids are NOT DETECTED.  The SARS-CoV-2 RNA is generally detectable in upper respiratory specimens during the acute phase of infection. The lowest concentration of SARS-CoV-2 viral copies this assay can detect is 138 copies/mL. A negative result does not preclude SARS-Cov-2 infection and should not be used as the sole basis for treatment or other patient management decisions. A negative result may occur with  improper specimen collection/handling, submission of specimen other than nasopharyngeal swab, presence of viral mutation(s) within the areas targeted by this assay, and inadequate number of viral copies(<138 copies/mL). A negative result must be combined with clinical observations, patient history, and epidemiological information. The expected result is Negative.  Fact Sheet for Patients:  EntrepreneurPulse.com.au  Fact Sheet for Healthcare Providers:  IncredibleEmployment.be  This test is no t yet approved or cleared by the [place] FDA and  has been authorized for detection and/or  diagnosis of SARS-CoV-2 by FDA under an Emergency Use Authorization (EUA). This EUA will remain  in effect (meaning this test can be used) for the duration of the COVID-19 declaration under Section 564(b)(1) of the Act, 21 U.S.C.section 360bbb-3(b)(1), unless the authorization is terminated  or revoked sooner.       Influenza A by PCR NEGATIVE NEGATIVE Final   Influenza B by PCR NEGATIVE NEGATIVE Final    Comment: (NOTE) The Xpert Xpress SARS-CoV-2/FLU/RSV plus assay is intended as an aid in the diagnosis of influenza from Nasopharyngeal swab specimens and should not be used as a sole basis for treatment. Nasal washings and aspirates are unacceptable for Xpert Xpress SARS-CoV-2/FLU/RSV testing.  Fact Sheet for Patients: EntrepreneurPulse.com.au  Fact Sheet for Healthcare Providers: IncredibleEmployment.be  This test is not yet approved or cleared by the [place] FDA and has been authorized for detection and/or diagnosis of SARS-CoV-2 by FDA under an Emergency Use Authorization (EUA). This EUA will remain in effect (meaning this test can be used) for the duration of the COVID-19 declaration under Section 564(b)(1) of the Act, 21 U.S.C. section 360bbb-3(b)(1), unless the authorization is terminated or revoked.  Performed at [facility], [address], [place],  [address]   MRSA Next Gen by PCR, Nasal  Status: None   Collection Time: 12/26/21  4:01 PM   Specimen: Nasal Mucosa; Nasal Swab  Result Value Ref Range Status   MRSA by PCR Next Gen NOT DETECTED NOT DETECTED Final    Comment: (NOTE) The GeneXpert MRSA Assay (FDA approved for NASAL specimens only), is one component of a comprehensive MRSA colonization surveillance program. It is not intended to diagnose MRSA infection nor to guide or monitor treatment for MRSA infections. Test performance is not FDA approved in patients less than 38 years old. Performed at  [facility], [address] [place], [place] [address]   Culture, Respiratory w Gram Stain     Status: None   Collection Time: 12/27/21 10:45 AM   Specimen: Bronchoalveolar Lavage; Respiratory  Result Value Ref Range Status   Specimen Description   Final    BRONCHIAL ALVEOLAR LAVAGE Performed at [facility], [address], [place], [place] [address]    Special Requests   Final    NONE Performed at [facility], [address] [place], [place] [address]    Gram Stain   Final    RARE WBC PRESENT, PREDOMINANTLY PMN RARE GRAM POSITIVE COCCI IN PAIRS RARE GRAM POSITIVE RODS    Culture   Final    FEW Consistent with normal respiratory flora. Performed at [facility], [place] [address], [place], [place] [address]    Report Status 12/29/2021 FINAL  Final    Labs: CBC: Recent Labs  Lab 12/26/21 1307 12/27/21 0414 12/28/21 0416  WBC 12.6* 12.1* 12.7*  NEUTROABS 11.3*  --   --   HGB 10.8* 11.6* 11.4*  HCT 36.2 37.4 36.0  MCV 102.3* 97.9 95.0  PLT 189 171 025   Basic Metabolic Panel: Recent Labs  Lab 12/26/21 1202 12/27/21 0414 12/27/21 1250 12/28/21 0416  NA 137 139  --  142  K 4.6 5.4* 4.5 4.2  CL 103 109  --  109  CO2 23 23  --  23  GLUCOSE 112* 136*  --  124*  BUN 27* 37*  --  58*  CREATININE 1.56* 2.01*  --  2.35*  CALCIUM 8.8* 8.3*  --  8.6*  MG  --  2.0  --  2.2  PHOS  --  5.8*  --   --    Liver Function Tests: Recent Labs  Lab 12/26/21 1202  AST 51*  ALT 28  ALKPHOS 75  BILITOT 0.5  PROT 7.1  ALBUMIN 3.7   CBG: Recent Labs  Lab 12/26/21 1606  GLUCAP 104*    Discharge time spent: greater than+ 30 minutes.  Signed: Berle Mull, MD [facility]

## 2021-12-29 NOTE — Consult Note (Deleted)
[facility] for Electrolyte Monitoring and Replacement   Recent Labs: Potassium (mmol/L)  Date Value  12/28/2021 4.2  11/04/2013 4.0   Magnesium (mg/dL)  Date Value  12/28/2021 2.2   Calcium (mg/dL)  Date Value  12/28/2021 8.6 (L)   Calcium, Total (mg/dL)  Date Value  11/04/2013 8.8   Albumin (g/dL)  Date Value  12/26/2021 3.7  12/12/2020 4.6   Phosphorus (mg/dL)  Date Value  12/27/2021 5.8 (H)   Sodium (mmol/L)  Date Value  12/28/2021 142  12/12/2020 136  11/04/2013 131 (L)   Assessment: Patient is a 76 y/o F with medical history including COPD, HLD, GERD, EtOH use disorder, tobacco use disorder, osteoporosis who was BIBEMS 5/31 after being found at home unresponsive. Patient intubated in the ED for hypercarbic respiratory failure. Disposition is the ICU. Pharmacy consulted to assist with electrolyte monitoring and replacement as indicated.  Goal of Therapy:  Electrolytes within normal limits  Plan:  --No electrolyte replacement warranted for today --Patient care transferred from PCCM to [facility] Will discontinue electrolyte consult at this time. Defer further ordering of labs and electrolyte replacement to primary team --Pharmacy will continue to monitor peripherally  Benita Gutter 12/29/2021 7:25 AM

## 2021-12-31 LAB — CULTURE, BLOOD (ROUTINE X 2)
Culture: NO GROWTH
Culture: NO GROWTH
Special Requests: ADEQUATE
Special Requests: ADEQUATE

## 2022-01-01 ENCOUNTER — Ambulatory Visit: Admission: RE | Admit: 2022-01-01 | Payer: Medicare Other | Source: Home / Self Care | Admitting: Gastroenterology

## 2022-01-01 SURGERY — COLONOSCOPY WITH PROPOFOL
Anesthesia: General

## 2022-01-26 DEATH — deceased

## 2022-02-05 ENCOUNTER — Ambulatory Visit: Payer: Medicare Other | Admitting: Student in an Organized Health Care Education/Training Program
